# Patient Record
Sex: Female | Born: 1935 | Race: White | Hispanic: No | Smoking: Never smoker
Health system: Southern US, Community
[De-identification: ages and names within clinical notes are randomized; demographics above are authoritative.]

## PROBLEM LIST (undated history)

## (undated) DIAGNOSIS — N390 Urinary tract infection, site not specified: Secondary | ICD-10-CM

## (undated) DIAGNOSIS — I4819 Other persistent atrial fibrillation: Secondary | ICD-10-CM

## (undated) DIAGNOSIS — F0280 Dementia in other diseases classified elsewhere without behavioral disturbance: Secondary | ICD-10-CM

## (undated) DIAGNOSIS — J40 Bronchitis, not specified as acute or chronic: Secondary | ICD-10-CM

## (undated) DIAGNOSIS — I447 Left bundle-branch block, unspecified: Secondary | ICD-10-CM

## (undated) DIAGNOSIS — I509 Heart failure, unspecified: Secondary | ICD-10-CM

## (undated) DIAGNOSIS — K922 Gastrointestinal hemorrhage, unspecified: Secondary | ICD-10-CM

## (undated) DIAGNOSIS — E0590 Thyrotoxicosis, unspecified without thyrotoxic crisis or storm: Secondary | ICD-10-CM

## (undated) DIAGNOSIS — E559 Vitamin D deficiency, unspecified: Secondary | ICD-10-CM

## (undated) DIAGNOSIS — N183 Chronic kidney disease, stage 3 unspecified: Secondary | ICD-10-CM

## (undated) DIAGNOSIS — J189 Pneumonia, unspecified organism: Secondary | ICD-10-CM

## (undated) DIAGNOSIS — E538 Deficiency of other specified B group vitamins: Secondary | ICD-10-CM

## (undated) DIAGNOSIS — K219 Gastro-esophageal reflux disease without esophagitis: Secondary | ICD-10-CM

## (undated) DIAGNOSIS — I10 Essential (primary) hypertension: Secondary | ICD-10-CM

## (undated) DIAGNOSIS — I4891 Unspecified atrial fibrillation: Secondary | ICD-10-CM

## (undated) DIAGNOSIS — E079 Disorder of thyroid, unspecified: Secondary | ICD-10-CM

## (undated) DIAGNOSIS — E785 Hyperlipidemia, unspecified: Secondary | ICD-10-CM

## (undated) DIAGNOSIS — E663 Overweight: Secondary | ICD-10-CM

## (undated) DIAGNOSIS — G309 Alzheimer's disease, unspecified: Secondary | ICD-10-CM

## (undated) DIAGNOSIS — E119 Type 2 diabetes mellitus without complications: Secondary | ICD-10-CM

## (undated) DIAGNOSIS — R0600 Dyspnea, unspecified: Secondary | ICD-10-CM

## (undated) DIAGNOSIS — N3281 Overactive bladder: Secondary | ICD-10-CM

## (undated) HISTORY — PX: ABDOMINAL HYSTERECTOMY: SHX81

## (undated) HISTORY — DX: Hyperlipidemia, unspecified: E78.5

## (undated) HISTORY — DX: Left bundle-branch block, unspecified: I44.7

## (undated) HISTORY — DX: Deficiency of other specified B group vitamins: E53.8

## (undated) HISTORY — DX: Gastro-esophageal reflux disease without esophagitis: K21.9

## (undated) HISTORY — DX: Unspecified atrial fibrillation: I48.91

## (undated) HISTORY — DX: Gastrointestinal hemorrhage, unspecified: K92.2

## (undated) HISTORY — DX: Alzheimer's disease, unspecified: G30.9

## (undated) HISTORY — DX: Overweight: E66.3

## (undated) HISTORY — DX: Chronic kidney disease, stage 3 unspecified: N18.30

## (undated) HISTORY — DX: Overactive bladder: N32.81

## (undated) HISTORY — DX: Chronic kidney disease, stage 3 (moderate): N18.3

## (undated) HISTORY — DX: Vitamin D deficiency, unspecified: E55.9

## (undated) HISTORY — DX: Other persistent atrial fibrillation: I48.19

## (undated) HISTORY — DX: Thyrotoxicosis, unspecified without thyrotoxic crisis or storm: E05.90

## (undated) HISTORY — DX: Dementia in other diseases classified elsewhere, unspecified severity, without behavioral disturbance, psychotic disturbance, mood disturbance, and anxiety: F02.80

---

## 2013-11-20 DIAGNOSIS — K922 Gastrointestinal hemorrhage, unspecified: Secondary | ICD-10-CM

## 2013-11-20 HISTORY — DX: Gastrointestinal hemorrhage, unspecified: K92.2

## 2015-01-24 ENCOUNTER — Emergency Department (HOSPITAL_BASED_OUTPATIENT_CLINIC_OR_DEPARTMENT_OTHER)
Admission: EM | Admit: 2015-01-24 | Discharge: 2015-01-24 | Disposition: A | Discharge status: Home / Self Care | Payer: Medicare HMO | Attending: Emergency Medicine | Admitting: Emergency Medicine

## 2015-01-24 ENCOUNTER — Encounter (HOSPITAL_BASED_OUTPATIENT_CLINIC_OR_DEPARTMENT_OTHER): Payer: Self-pay | Admitting: *Deleted

## 2015-01-24 ENCOUNTER — Emergency Department (HOSPITAL_BASED_OUTPATIENT_CLINIC_OR_DEPARTMENT_OTHER): Payer: Medicare HMO

## 2015-01-24 DIAGNOSIS — Z87448 Personal history of other diseases of urinary system: Secondary | ICD-10-CM | POA: Diagnosis not present

## 2015-01-24 DIAGNOSIS — I4891 Unspecified atrial fibrillation: Secondary | ICD-10-CM | POA: Diagnosis not present

## 2015-01-24 DIAGNOSIS — Z79899 Other long term (current) drug therapy: Secondary | ICD-10-CM | POA: Diagnosis not present

## 2015-01-24 DIAGNOSIS — I10 Essential (primary) hypertension: Secondary | ICD-10-CM | POA: Insufficient documentation

## 2015-01-24 DIAGNOSIS — I509 Heart failure, unspecified: Secondary | ICD-10-CM

## 2015-01-24 DIAGNOSIS — R0981 Nasal congestion: Secondary | ICD-10-CM | POA: Diagnosis present

## 2015-01-24 DIAGNOSIS — F0390 Unspecified dementia without behavioral disturbance: Secondary | ICD-10-CM | POA: Diagnosis not present

## 2015-01-24 DIAGNOSIS — E119 Type 2 diabetes mellitus without complications: Secondary | ICD-10-CM | POA: Insufficient documentation

## 2015-01-24 HISTORY — DX: Essential (primary) hypertension: I10

## 2015-01-24 HISTORY — DX: Type 2 diabetes mellitus without complications: E11.9

## 2015-01-24 HISTORY — DX: Heart failure, unspecified: I50.9

## 2015-01-24 LAB — COMPREHENSIVE METABOLIC PANEL
ALBUMIN: 3.8 g/dL (ref 3.5–5.0)
ALT: 18 U/L (ref 14–54)
AST: 21 U/L (ref 15–41)
Alkaline Phosphatase: 90 U/L (ref 38–126)
Anion gap: 9 (ref 5–15)
BUN: 30 mg/dL — AB (ref 6–20)
CHLORIDE: 105 mmol/L (ref 101–111)
CO2: 26 mmol/L (ref 22–32)
Calcium: 8.8 mg/dL — ABNORMAL LOW (ref 8.9–10.3)
Creatinine, Ser: 1.81 mg/dL — ABNORMAL HIGH (ref 0.44–1.00)
GFR, EST AFRICAN AMERICAN: 28 mL/min — AB (ref >60)
GFR, EST NON AFRICAN AMERICAN: 25 mL/min — AB (ref >60)
Glucose, Bld: 199 mg/dL — ABNORMAL HIGH (ref 65–99)
POTASSIUM: 3.9 mmol/L (ref 3.5–5.1)
Sodium: 140 mmol/L (ref 135–145)
Total Bilirubin: 0.4 mg/dL (ref 0.3–1.2)
Total Protein: 7.3 g/dL (ref 6.5–8.1)

## 2015-01-24 LAB — CBC WITH DIFFERENTIAL/PLATELET
Basophils Absolute: 0 10*3/uL (ref 0.0–0.1)
Basophils Relative: 0 % (ref 0–1)
EOS PCT: 2 % (ref 0–5)
Eosinophils Absolute: 0.1 10*3/uL (ref 0.0–0.7)
HEMATOCRIT: 38.4 % (ref 36.0–46.0)
Hemoglobin: 11.6 g/dL — ABNORMAL LOW (ref 12.0–15.0)
LYMPHS ABS: 1.4 10*3/uL (ref 0.7–4.0)
Lymphocytes Relative: 21 % (ref 12–46)
MCH: 26.4 pg (ref 26.0–34.0)
MCHC: 30.2 g/dL (ref 30.0–36.0)
MCV: 87.3 fL (ref 78.0–100.0)
Monocytes Absolute: 0.6 10*3/uL (ref 0.1–1.0)
Monocytes Relative: 8 % (ref 3–12)
NEUTROS ABS: 4.9 10*3/uL (ref 1.7–7.7)
NEUTROS PCT: 69 % (ref 43–77)
Platelets: 215 10*3/uL (ref 150–400)
RBC: 4.4 MIL/uL (ref 3.87–5.11)
RDW: 15 % (ref 11.5–15.5)
WBC: 7 10*3/uL (ref 4.0–10.5)

## 2015-01-24 LAB — BRAIN NATRIURETIC PEPTIDE: B NATRIURETIC PEPTIDE 5: 71.4 pg/mL (ref 0.0–100.0)

## 2015-01-24 LAB — TROPONIN I

## 2015-01-24 MED ORDER — FUROSEMIDE 10 MG/ML IJ SOLN
40.0000 mg | Freq: Once | INTRAMUSCULAR | Status: AC
  Administered 2015-01-24: 40 mg via INTRAVENOUS
  Filled 2015-01-24: qty 4

## 2015-01-24 NOTE — Discharge Instructions (Signed)
Increase your Lasix to 40 mg twice daily for the next 3 days, then resume your normal dose.  Return to the emergency department if your symptoms significantly worsen or change.   Heart Failure Heart failure is a condition in which the heart has trouble pumping blood. This means your heart does not pump blood efficiently for your body to work well. In some cases of heart failure, fluid may back up into your lungs or you may have swelling (edema) in your lower legs. Heart failure is usually a long-term (chronic) condition. It is important for you to take good care of yourself and follow your health care provider's treatment plan. CAUSES  Some health conditions can cause heart failure. Those health conditions include:  High blood pressure (hypertension). Hypertension causes the heart muscle to work harder than normal. When pressure in the blood vessels is high, the heart needs to pump (contract) with more force in order to circulate blood throughout the body. High blood pressure eventually causes the heart to become stiff and weak.  Coronary artery disease (CAD). CAD is the buildup of cholesterol and fat (plaque) in the arteries of the heart. The blockage in the arteries deprives the heart muscle of oxygen and blood. This can cause chest pain and may lead to a heart attack. High blood pressure can also contribute to CAD.  Heart attack (myocardial infarction). A heart attack occurs when one or more arteries in the heart become blocked. The loss of oxygen damages the muscle tissue of the heart. When this happens, part of the heart muscle dies. The injured tissue does not contract as well and weakens the heart's ability to pump blood.  Abnormal heart valves. When the heart valves do not open and close properly, it can cause heart failure. This makes the heart muscle pump harder to keep the blood flowing.  Heart muscle disease (cardiomyopathy or myocarditis). Heart muscle disease is damage to the heart  muscle from a variety of causes. These can include drug or alcohol abuse, infections, or unknown reasons. These can increase the risk of heart failure.  Lung disease. Lung disease makes the heart work harder because the lungs do not work properly. This can cause a strain on the heart, leading it to fail.  Diabetes. Diabetes increases the risk of heart failure. High blood sugar contributes to high fat (lipid) levels in the blood. Diabetes can also cause slow damage to tiny blood vessels that carry important nutrients to the heart muscle. When the heart does not get enough oxygen and food, it can cause the heart to become weak and stiff. This leads to a heart that does not contract efficiently.  Other conditions can contribute to heart failure. These include abnormal heart rhythms, thyroid problems, and low blood counts (anemia). Certain unhealthy behaviors can increase the risk of heart failure, including:  Being overweight.  Smoking or chewing tobacco.  Eating foods high in fat and cholesterol.  Abusing illicit drugs or alcohol.  Lacking physical activity. SYMPTOMS  Heart failure symptoms may vary and can be hard to detect. Symptoms may include:  Shortness of breath with activity, such as climbing stairs.  Persistent cough.  Swelling of the feet, ankles, legs, or abdomen.  Unexplained weight gain.  Difficulty breathing when lying flat (orthopnea).  Waking from sleep because of the need to sit up and get more air.  Rapid heartbeat.  Fatigue and loss of energy.  Feeling light-headed, dizzy, or close to fainting.  Loss of appetite.  Nausea.  Increased urination during the night (nocturia). DIAGNOSIS  A diagnosis of heart failure is based on your history, symptoms, physical examination, and diagnostic tests. Diagnostic tests for heart failure may include:  Echocardiography.  Electrocardiography.  Chest X-ray.  Blood tests.  Exercise stress test.  Cardiac  angiography.  Radionuclide scans. TREATMENT  Treatment is aimed at managing the symptoms of heart failure. Medicines, behavioral changes, or surgical intervention may be necessary to treat heart failure.  Medicines to help treat heart failure may include:  Angiotensin-converting enzyme (ACE) inhibitors. This type of medicine blocks the effects of a blood protein called angiotensin-converting enzyme. ACE inhibitors relax (dilate) the blood vessels and help lower blood pressure.  Angiotensin receptor blockers (ARBs). This type of medicine blocks the actions of a blood protein called angiotensin. Angiotensin receptor blockers dilate the blood vessels and help lower blood pressure.  Water pills (diuretics). Diuretics cause the kidneys to remove salt and water from the blood. The extra fluid is removed through urination. This loss of extra fluid lowers the volume of blood the heart pumps.  Beta blockers. These prevent the heart from beating too fast and improve heart muscle strength.  Digitalis. This increases the force of the heartbeat.  Healthy behavior changes include:  Obtaining and maintaining a healthy weight.  Stopping smoking or chewing tobacco.  Eating heart-healthy foods.  Limiting or avoiding alcohol.  Stopping illicit drug use.  Physical activity as directed by your health care provider.  Surgical treatment for heart failure may include:  A procedure to open blocked arteries, repair damaged heart valves, or remove damaged heart muscle tissue.  A pacemaker to improve heart muscle function and control certain abnormal heart rhythms.  An internal cardioverter defibrillator to treat certain serious abnormal heart rhythms.  A left ventricular assist device (LVAD) to assist the pumping ability of the heart. HOME CARE INSTRUCTIONS   Take medicines only as directed by your health care provider. Medicines are important in reducing the workload of your heart, slowing the  progression of heart failure, and improving your symptoms.  Do not stop taking your medicine unless directed by your health care provider.  Do not skip any dose of medicine.  Refill your prescriptions before you run out of medicine. Your medicines are needed every day.  Engage in moderate physical activity if directed by your health care provider. Moderate physical activity can benefit some people. The elderly and people with severe heart failure should consult with a health care provider for physical activity recommendations.  Eat heart-healthy foods. Food choices should be free of trans fat and low in saturated fat, cholesterol, and salt (sodium). Healthy choices include fresh or frozen fruits and vegetables, fish, lean meats, legumes, fat-free or low-fat dairy products, and whole grain or high fiber foods. Talk to a dietitian to learn more about heart-healthy foods.  Limit sodium if directed by your health care provider. Sodium restriction may reduce symptoms of heart failure in some people. Talk to a dietitian to learn more about heart-healthy seasonings.  Use healthy cooking methods. Healthy cooking methods include roasting, grilling, broiling, baking, poaching, steaming, or stir-frying. Talk to a dietitian to learn more about healthy cooking methods.  Limit fluids if directed by your health care provider. Fluid restriction may reduce symptoms of heart failure in some people.  Weigh yourself every day. Daily weights are important in the early recognition of excess fluid. You should weigh yourself every morning after you urinate and before you eat breakfast. Wear the same amount of  clothing each time you weigh yourself. Record your daily weight. Provide your health care provider with your weight record.  Monitor and record your blood pressure if directed by your health care provider.  Check your pulse if directed by your health care provider.  Lose weight if directed by your health care  provider. Weight loss may reduce symptoms of heart failure in some people.  Stop smoking or chewing tobacco. Nicotine makes your heart work harder by causing your blood vessels to constrict. Do not use nicotine gum or patches before talking to your health care provider.  Keep all follow-up visits as directed by your health care provider. This is important.  Limit alcohol intake to no more than 1 drink per day for nonpregnant women and 2 drinks per day for men. One drink equals 12 ounces of beer, 5 ounces of wine, or 1 ounces of hard liquor. Drinking more than that is harmful to your heart. Tell your health care provider if you drink alcohol several times a week. Talk with your health care provider about whether alcohol is safe for you. If your heart has already been damaged by alcohol or you have severe heart failure, drinking alcohol should be stopped completely.  Stop illicit drug use.  Stay up-to-date with immunizations. It is especially important to prevent respiratory infections through current pneumococcal and influenza immunizations.  Manage other health conditions such as hypertension, diabetes, thyroid disease, or abnormal heart rhythms as directed by your health care provider.  Learn to manage stress.  Plan rest periods when fatigued.  Learn strategies to manage high temperatures. If the weather is extremely hot:  Avoid vigorous physical activity.  Use air conditioning or fans or seek a cooler location.  Avoid caffeine and alcohol.  Wear loose-fitting, lightweight, and light-colored clothing.  Learn strategies to manage cold temperatures. If the weather is extremely cold:  Avoid vigorous physical activity.  Layer clothes.  Wear mittens or gloves, a hat, and a scarf when going outside.  Avoid alcohol.  Obtain ongoing education and support as needed.  Participate in or seek rehabilitation as needed to maintain or improve independence and quality of life. SEEK MEDICAL  CARE IF:   Your weight increases by 03 lb/1.4 kg in 1 day or 05 lb/2.3 kg in a week.  You have increasing shortness of breath that is unusual for you.  You are unable to participate in your usual physical activities.  You tire easily.  You cough more than normal, especially with physical activity.  You have any or more swelling in areas such as your hands, feet, ankles, or abdomen.  You are unable to sleep because it is hard to breathe.  You feel like your heart is beating fast (palpitations).  You become dizzy or light-headed upon standing up. SEEK IMMEDIATE MEDICAL CARE IF:   You have difficulty breathing.  There is a change in mental status such as decreased alertness or difficulty with concentration.  You have a pain or discomfort in your chest.  You have an episode of fainting (syncope). MAKE SURE YOU:   Understand these instructions.  Will watch your condition.  Will get help right away if you are not doing well or get worse. Document Released: 08/08/2005 Document Revised: 12/23/2013 Document Reviewed: 09/07/2012 [facility] Patient Information 2015 [place], [place]. This information is not intended to replace advice given to you by your health care provider. Make sure you discuss any questions you have with your health care provider.

## 2015-01-24 NOTE — ED Provider Notes (Signed)
CSN: [id number]     Arrival date & time 01/24/15  1255 History  This chart was scribed for Anna Speak, MD by Meriel Pica, ED Scribe. This patient was seen in room MH08/MH08 and the patient's care was started 3:07 PM.  Chief Complaint  Patient presents with  . URI   Patient is a 81 y.o. female presenting with URI. The history is provided by the patient and a relative. No language interpreter was used.  URI Presenting symptoms: congestion   Presenting symptoms: no fever   Severity:  Moderate Onset quality:  Gradual Timing:  Constant Progression:  Improving  HPI Comments: Anna [name] is a 81 y.o. female, with a PMhx of CHF,A-fib, renal disorder, DM, HTN, and dementia, who presents to the Emergency Department complaining of constant, moderate, sinus congestion onset 3 weeks ago. The daughter reports associated labored breathing, fatigue, a change in appetite, and mild BLE swelling in the morning. Per daughter 3 weeks ago the pt was diagnosed with a sinus infection and given 2 weeks of antibiotics that she was compliant with. The daughter reports her O2 levels are good at home. She uses a walker and at home and has been winded when ambulating. Daughter has given her 600mg  Mucinex per day pta with no relief. The pt is currently on Lasix but has recently decreased her dosage after discussion with her PCP. Pt denies nausea, vomiting, diarrhea, fevers, or changes in bowel or bladder.   Past Medical History  Diagnosis Date  . CHF (congestive heart failure)   . A-fib   . Renal disorder   . Diabetes mellitus without complication   . Hypertension   . Dementia    Past Surgical History  Procedure Laterality Date  . Abdominal hysterectomy     No family history on file. History  Substance Use Topics  . Smoking status: Never Smoker   . Smokeless tobacco: Not on file  . Alcohol Use: No   OB History    No data available     Review of Systems  Constitutional: Positive for appetite  change. Negative for fever.  HENT: Positive for congestion.   Respiratory: Positive for shortness of breath.   Cardiovascular: Positive for leg swelling.  Gastrointestinal: Negative for nausea, vomiting, diarrhea and constipation.  Genitourinary: Negative for urgency and frequency.  Neurological: Positive for weakness.   A complete 10 system review of systems was obtained and is otherwise negative except at noted in the HPI and PMH.  Allergies  Review of patient's allergies indicates no known allergies.  Home Medications   Prior to Admission medications   Medication Sig Start Date End Date Taking? Authorizing Provider  amiodarone (PACERONE) 200 MG tablet Take 200 mg by mouth daily.   Yes Historical Provider, MD  amLODipine-benazepril (LOTREL) 5-10 MG per capsule Take 1 capsule by mouth daily.   Yes Historical Provider, MD  donepezil (ARICEPT) 10 MG tablet Take 10 mg by mouth at bedtime.   Yes Historical Provider, MD  glimepiride (AMARYL) 4 MG tablet Take 4 mg by mouth daily with breakfast.   Yes Historical Provider, MD  guaiFENesin (MUCINEX) 600 MG 12 hr tablet Take by mouth 2 (two) times daily.   Yes Historical Provider, MD  metoprolol succinate (TOPROL-XL) 25 MG 24 hr tablet Take 12.5 mg by mouth daily.   Yes Historical Provider, MD  pravastatin (PRAVACHOL) 40 MG tablet Take 40 mg by mouth daily.   Yes Historical Provider, MD   Triage Vitals: BP 161/69 mmHg  Pulse 97  Temp(Src) 98.2 F (36.8 C) (Oral)  Resp 18  Ht 5\' 6"  (1.676 m)  Wt 215 lb (97.523 kg)  BMI 34.72 kg/m2  SpO2 96%  Physical Exam  Constitutional: She appears well-developed and well-nourished.  HENT:  Head: Normocephalic and atraumatic.  Eyes: Conjunctivae are normal. Right eye exhibits no discharge. Left eye exhibits no discharge.  Cardiovascular: Normal rate, regular rhythm and normal heart sounds.   No murmur heard. Pulmonary/Chest: Effort normal. No respiratory distress. She has rales.  There are rales in  the bases bilaterally.   Musculoskeletal: She exhibits edema.  There is 1+ pitting edema in BLE.   Neurological: She is alert. Coordination normal.  Skin: Skin is warm and dry. No rash noted. She is not diaphoretic. No erythema.  Psychiatric: She has a normal mood and affect.  Nursing note and vitals reviewed.  ED Course  Procedures  DIAGNOSTIC STUDIES: Oxygen Saturation is 96% on RA, adequate by my interpretation.    COORDINATION OF CARE: 3:13 PM Discussed treatment plan which includes to order Lasix for pt in ED and get diagnostic labs. Discussed CX-ray with pt and daughter. Pt and daughter acknowledges and agrees to plan.   Labs Review Labs Reviewed  COMPREHENSIVE METABOLIC PANEL  BRAIN NATRIURETIC PEPTIDE  TROPONIN I  CBC WITH DIFFERENTIAL/PLATELET    Imaging Review Dg Chest 2 View  01/24/2015   CLINICAL DATA:  Reason URI, continued congestion, history CHF, atrial fibrillation, dementia, diabetes, hypertension  EXAM: CHEST  2 VIEW  COMPARISON:  None  FINDINGS: Enlargement of cardiac silhouette with pulmonary vascular congestion.  Scattered interstitial infiltrates likely pulmonary edema and CHF.  Minimal bibasilar atelectasis.  No segmental consolidation, pleural effusion or pneumothorax.  IMPRESSION: Probable CHF with minimal bibasilar atelectasis.   Electronically Signed   By: Lavonia Dana M.D.   On: 01/24/2015 14:40     EKG Interpretation   Date/Time:  Saturday January 24 2015 16:03:32 EDT Ventricular Rate:  54 PR Interval:  286 QRS Duration: 166 QT Interval:  522 QTC Calculation: 495 R Axis:   1 Text Interpretation:  Sinus bradycardia with 1st degree A-V block Left  bundle branch block Abnormal ECG No previous tracing Confirmed by POLLINA   MD, CHRISTOPHER [phone]) on 01/24/2015 4:06:45 PM      MDM   Final diagnoses:  None    Patient is an 81 year old female with history of congestive heart failure and atrial fibrillation. She presents for evaluation of chest  congestion and dyspnea on exertion over the past week. She is here from out of town visiting her daughter who brings her for evaluation of this. Workup reveals chest x-ray suggestive of volume overload, however EKG and troponin are negative for acute infarct. She was given Lasix with a good diuresis. She is feeling better and I feel as though she is appropriate for discharge with increased Lasix dose for the next 3 days and when necessary return.  I personally performed the services described in this documentation, which was scribed in my presence. The recorded information has been reviewed and is accurate.      Anna Speak, MD 01/24/15 [phone]

## 2015-01-24 NOTE — ED Notes (Signed)
Pt is visiting from [place] and daughter states that she was recently on antibiotics for URI and has completed this and her daughter is concerned about her continued congestion and her hx of CHF and afib as well as wanting her checked out in general due to her medical history.  Pt appears in no distress and tells me that she feels "fine"

## 2015-01-27 ENCOUNTER — Other Ambulatory Visit (HOSPITAL_COMMUNITY): Payer: Self-pay

## 2015-01-27 ENCOUNTER — Emergency Department (HOSPITAL_COMMUNITY): Payer: Medicare HMO

## 2015-01-27 ENCOUNTER — Encounter (HOSPITAL_COMMUNITY): Payer: Self-pay | Admitting: Emergency Medicine

## 2015-01-27 ENCOUNTER — Inpatient Hospital Stay (HOSPITAL_COMMUNITY)
Admission: EM | Admit: 2015-01-27 | Discharge: 2015-01-27 | Disposition: A | Discharge status: Home / Self Care | Payer: Medicare HMO | Source: Home / Self Care | Attending: Internal Medicine | Admitting: Internal Medicine

## 2015-01-27 ENCOUNTER — Observation Stay (HOSPITAL_COMMUNITY)
Admission: EM | Admit: 2015-01-27 | Discharge: 2015-01-28 | Disposition: A | Discharge status: Home / Self Care | Payer: Medicare HMO | Attending: Internal Medicine | Admitting: Internal Medicine

## 2015-01-27 DIAGNOSIS — N179 Acute kidney failure, unspecified: Secondary | ICD-10-CM | POA: Insufficient documentation

## 2015-01-27 DIAGNOSIS — E1122 Type 2 diabetes mellitus with diabetic chronic kidney disease: Secondary | ICD-10-CM | POA: Insufficient documentation

## 2015-01-27 DIAGNOSIS — E1129 Type 2 diabetes mellitus with other diabetic kidney complication: Secondary | ICD-10-CM | POA: Diagnosis not present

## 2015-01-27 DIAGNOSIS — Z7982 Long term (current) use of aspirin: Secondary | ICD-10-CM | POA: Diagnosis not present

## 2015-01-27 DIAGNOSIS — R0600 Dyspnea, unspecified: Principal | ICD-10-CM | POA: Insufficient documentation

## 2015-01-27 DIAGNOSIS — I129 Hypertensive chronic kidney disease with stage 1 through stage 4 chronic kidney disease, or unspecified chronic kidney disease: Secondary | ICD-10-CM | POA: Diagnosis not present

## 2015-01-27 DIAGNOSIS — N183 Chronic kidney disease, stage 3 unspecified: Secondary | ICD-10-CM | POA: Diagnosis present

## 2015-01-27 DIAGNOSIS — M79609 Pain in unspecified limb: Secondary | ICD-10-CM | POA: Diagnosis not present

## 2015-01-27 DIAGNOSIS — R0602 Shortness of breath: Secondary | ICD-10-CM | POA: Diagnosis present

## 2015-01-27 DIAGNOSIS — R627 Adult failure to thrive: Secondary | ICD-10-CM | POA: Insufficient documentation

## 2015-01-27 DIAGNOSIS — I5020 Unspecified systolic (congestive) heart failure: Secondary | ICD-10-CM | POA: Insufficient documentation

## 2015-01-27 DIAGNOSIS — I10 Essential (primary) hypertension: Secondary | ICD-10-CM | POA: Diagnosis present

## 2015-01-27 DIAGNOSIS — Z90710 Acquired absence of both cervix and uterus: Secondary | ICD-10-CM | POA: Insufficient documentation

## 2015-01-27 DIAGNOSIS — F0390 Unspecified dementia without behavioral disturbance: Secondary | ICD-10-CM | POA: Diagnosis not present

## 2015-01-27 DIAGNOSIS — I480 Paroxysmal atrial fibrillation: Secondary | ICD-10-CM | POA: Insufficient documentation

## 2015-01-27 DIAGNOSIS — I4819 Other persistent atrial fibrillation: Secondary | ICD-10-CM | POA: Diagnosis present

## 2015-01-27 DIAGNOSIS — E0590 Thyrotoxicosis, unspecified without thyrotoxic crisis or storm: Secondary | ICD-10-CM | POA: Insufficient documentation

## 2015-01-27 DIAGNOSIS — N058 Unspecified nephritic syndrome with other morphologic changes: Secondary | ICD-10-CM

## 2015-01-27 LAB — CBC WITH DIFFERENTIAL/PLATELET
BASOS ABS: 0 10*3/uL (ref 0.0–0.1)
Basophils Relative: 1 % (ref 0–1)
EOS PCT: 2 % (ref 0–5)
Eosinophils Absolute: 0.1 10*3/uL (ref 0.0–0.7)
HCT: 39 % (ref 36.0–46.0)
HEMOGLOBIN: 11.8 g/dL — AB (ref 12.0–15.0)
LYMPHS ABS: 1.3 10*3/uL (ref 0.7–4.0)
Lymphocytes Relative: 23 % (ref 12–46)
MCH: 25.9 pg — ABNORMAL LOW (ref 26.0–34.0)
MCHC: 30.3 g/dL (ref 30.0–36.0)
MCV: 85.7 fL (ref 78.0–100.0)
Monocytes Absolute: 0.6 10*3/uL (ref 0.1–1.0)
Monocytes Relative: 10 % (ref 3–12)
NEUTROS ABS: 3.7 10*3/uL (ref 1.7–7.7)
Neutrophils Relative %: 64 % (ref 43–77)
Platelets: 211 10*3/uL (ref 150–400)
RBC: 4.55 MIL/uL (ref 3.87–5.11)
RDW: 14.9 % (ref 11.5–15.5)
WBC: 5.7 10*3/uL (ref 4.0–10.5)

## 2015-01-27 LAB — URINALYSIS, ROUTINE W REFLEX MICROSCOPIC
Bilirubin Urine: NEGATIVE
GLUCOSE, UA: NEGATIVE mg/dL
HGB URINE DIPSTICK: NEGATIVE
KETONES UR: NEGATIVE mg/dL
Nitrite: NEGATIVE
Protein, ur: NEGATIVE mg/dL
Specific Gravity, Urine: 1.008 (ref 1.005–1.030)
UROBILINOGEN UA: 0.2 mg/dL (ref 0.0–1.0)
pH: 5.5 (ref 5.0–8.0)

## 2015-01-27 LAB — TROPONIN I
Troponin I: <0.03 ng/mL (ref <0.031)
Troponin I: <0.03 ng/mL (ref <0.031)

## 2015-01-27 LAB — CBC
HCT: 39.1 % (ref 36.0–46.0)
Hemoglobin: 12.3 g/dL (ref 12.0–15.0)
MCH: 26.8 pg (ref 26.0–34.0)
MCHC: 31.5 g/dL (ref 30.0–36.0)
MCV: 85.2 fL (ref 78.0–100.0)
Platelets: 234 10*3/uL (ref 150–400)
RBC: 4.59 MIL/uL (ref 3.87–5.11)
RDW: 14.8 % (ref 11.5–15.5)
WBC: 6.2 10*3/uL (ref 4.0–10.5)

## 2015-01-27 LAB — URINE MICROSCOPIC-ADD ON

## 2015-01-27 LAB — BASIC METABOLIC PANEL
Anion gap: 10 (ref 5–15)
BUN: 38 mg/dL — ABNORMAL HIGH (ref 6–20)
CHLORIDE: 104 mmol/L (ref 101–111)
CO2: 27 mmol/L (ref 22–32)
Calcium: 9.3 mg/dL (ref 8.9–10.3)
Creatinine, Ser: 1.99 mg/dL — ABNORMAL HIGH (ref 0.44–1.00)
GFR calc Af Amer: 25 mL/min — ABNORMAL LOW (ref >60)
GFR calc non Af Amer: 22 mL/min — ABNORMAL LOW (ref >60)
GLUCOSE: 204 mg/dL — AB (ref 65–99)
Potassium: 3.7 mmol/L (ref 3.5–5.1)
Sodium: 141 mmol/L (ref 135–145)

## 2015-01-27 LAB — CREATININE, SERUM
CREATININE: 2.04 mg/dL — AB (ref 0.44–1.00)
GFR calc Af Amer: 25 mL/min — ABNORMAL LOW (ref >60)
GFR calc non Af Amer: 21 mL/min — ABNORMAL LOW (ref >60)

## 2015-01-27 LAB — GLUCOSE, CAPILLARY: Glucose-Capillary: 147 mg/dL — ABNORMAL HIGH (ref 65–99)

## 2015-01-27 LAB — CBG MONITORING, ED: Glucose-Capillary: 124 mg/dL — ABNORMAL HIGH (ref 65–99)

## 2015-01-27 LAB — TSH: TSH: 0.02 u[IU]/mL — ABNORMAL LOW (ref 0.350–4.500)

## 2015-01-27 LAB — BRAIN NATRIURETIC PEPTIDE: B Natriuretic Peptide: 26.4 pg/mL (ref 0.0–100.0)

## 2015-01-27 MED ORDER — INSULIN ASPART 100 UNIT/ML ~~LOC~~ SOLN
0.0000 [IU] | Freq: Three times a day (TID) | SUBCUTANEOUS | Status: DC
  Administered 2015-01-28: 2 [IU] via SUBCUTANEOUS

## 2015-01-27 MED ORDER — SODIUM CHLORIDE 0.9 % IJ SOLN: 3.0000 mL | INTRAMUSCULAR | Status: DC | PRN

## 2015-01-27 MED ORDER — SODIUM CHLORIDE 0.9 % IV SOLN: 250.0000 mL | INTRAVENOUS | Status: DC | PRN

## 2015-01-27 MED ORDER — GUAIFENESIN ER 600 MG PO TB12
600.0000 mg | ORAL_TABLET | Freq: Two times a day (BID) | ORAL | Status: DC
  Administered 2015-01-27 – 2015-01-28 (×2): 600 mg via ORAL
  Filled 2015-01-27 (×3): qty 1

## 2015-01-27 MED ORDER — AMLODIPINE BESYLATE 5 MG PO TABS
5.0000 mg | ORAL_TABLET | Freq: Every day | ORAL | Status: DC
  Administered 2015-01-28: 5 mg via ORAL
  Filled 2015-01-27: qty 1

## 2015-01-27 MED ORDER — AMIODARONE HCL 200 MG PO TABS
200.0000 mg | ORAL_TABLET | Freq: Every day | ORAL | Status: DC
  Administered 2015-01-28: 200 mg via ORAL
  Filled 2015-01-27: qty 1

## 2015-01-27 MED ORDER — ONDANSETRON HCL 4 MG/2ML IJ SOLN: 4.0000 mg | Freq: Four times a day (QID) | INTRAMUSCULAR | Status: DC | PRN

## 2015-01-27 MED ORDER — ACETAMINOPHEN 650 MG RE SUPP: 650.0000 mg | Freq: Four times a day (QID) | RECTAL | Status: DC | PRN

## 2015-01-27 MED ORDER — ALBUTEROL SULFATE (2.5 MG/3ML) 0.083% IN NEBU: 2.5000 mg | INHALATION_SOLUTION | RESPIRATORY_TRACT | Status: DC | PRN

## 2015-01-27 MED ORDER — MIRABEGRON ER 50 MG PO TB24
50.0000 mg | ORAL_TABLET | Freq: Every day | ORAL | Status: DC
  Administered 2015-01-27 – 2015-01-28 (×2): 50 mg via ORAL
  Filled 2015-01-27 (×2): qty 1

## 2015-01-27 MED ORDER — ASPIRIN 325 MG PO TABS
325.0000 mg | ORAL_TABLET | Freq: Every day | ORAL | Status: DC
  Administered 2015-01-28: 325 mg via ORAL
  Filled 2015-01-27: qty 1

## 2015-01-27 MED ORDER — VITAMIN D (ERGOCALCIFEROL) 1.25 MG (50000 UNIT) PO CAPS: 50000.0000 [IU] | ORAL_CAPSULE | ORAL | Status: DC

## 2015-01-27 MED ORDER — DOCUSATE SODIUM 100 MG PO CAPS: 100.0000 mg | ORAL_CAPSULE | Freq: Every day | ORAL | Status: DC | PRN

## 2015-01-27 MED ORDER — CYANOCOBALAMIN 500 MCG PO TABS
500.0000 ug | ORAL_TABLET | Freq: Every day | ORAL | Status: DC
  Administered 2015-01-28: 500 ug via ORAL
  Filled 2015-01-27: qty 1

## 2015-01-27 MED ORDER — FUROSEMIDE 10 MG/ML IJ SOLN
40.0000 mg | Freq: Once | INTRAMUSCULAR | Status: AC
  Administered 2015-01-27: 40 mg via INTRAVENOUS
  Filled 2015-01-27: qty 4

## 2015-01-27 MED ORDER — CALCITRIOL 0.5 MCG PO CAPS
0.5000 ug | ORAL_CAPSULE | ORAL | Status: DC
  Administered 2015-01-28: 0.5 ug via ORAL
  Filled 2015-01-27: qty 1

## 2015-01-27 MED ORDER — ONDANSETRON HCL 4 MG PO TABS: 4.0000 mg | ORAL_TABLET | Freq: Four times a day (QID) | ORAL | Status: DC | PRN

## 2015-01-27 MED ORDER — ACETAMINOPHEN 325 MG PO TABS: 650.0000 mg | ORAL_TABLET | Freq: Four times a day (QID) | ORAL | Status: DC | PRN

## 2015-01-27 MED ORDER — GUAIFENESIN-DM 100-10 MG/5ML PO SYRP: 5.0000 mL | ORAL_SOLUTION | ORAL | Status: DC | PRN

## 2015-01-27 MED ORDER — HEPARIN SODIUM (PORCINE) 5000 UNIT/ML IJ SOLN
5000.0000 [IU] | Freq: Three times a day (TID) | INTRAMUSCULAR | Status: DC
  Administered 2015-01-27 – 2015-01-28 (×2): 5000 [IU] via SUBCUTANEOUS
  Filled 2015-01-27 (×3): qty 1

## 2015-01-27 MED ORDER — PRAVASTATIN SODIUM 40 MG PO TABS
40.0000 mg | ORAL_TABLET | Freq: Every day | ORAL | Status: DC
  Administered 2015-01-28: 40 mg via ORAL
  Filled 2015-01-27: qty 1

## 2015-01-27 MED ORDER — SODIUM CHLORIDE 0.9 % IJ SOLN
3.0000 mL | Freq: Two times a day (BID) | INTRAMUSCULAR | Status: DC
Start: 2015-01-27 — End: 2015-01-28
  Administered 2015-01-27 – 2015-01-28 (×2): 3 mL via INTRAVENOUS

## 2015-01-27 NOTE — Progress Notes (Signed)
Arrived from ER. VS stable . Md was paged for orders

## 2015-01-27 NOTE — ED Notes (Signed)
Pt can go at 14:50

## 2015-01-27 NOTE — ED Provider Notes (Signed)
CSN: [id number]     Arrival date & time 01/27/15  0944 History   First MD Initiated Contact with Patient 01/27/15 0957     Chief Complaint  Patient presents with  . Shortness of Breath     (Consider location/radiation/quality/duration/timing/severity/associated sxs/prior Treatment) HPI Comments: 81 year old femalepresents with worsening shortness of breath and fatigue for the past 4 days. Patient was seen at outside [facility] department on the fourth of an Lasix which improved her symptoms x-ray was done showing mild heart failure. Patient has been taking oral Lasix last dose 40 Motrin this morning however does not feel any better. Patient's breathing worse with walking. Patient talks and drops to high 80s per family. No oxygen at home normally. No known weight gain, patient more swollen in the face and abdomen. No fever or chills. Wet sounding cough per family.  Patient is a 81 y.o. female presenting with shortness of breath. The history is provided by the patient, medical records and a relative.  Shortness of Breath Associated symptoms: cough   Associated symptoms: no abdominal pain, no chest pain, no fever, no headaches, no neck pain, no rash and no vomiting     Past Medical History  Diagnosis Date  . CHF (congestive heart failure)   . A-fib   . Renal disorder   . Diabetes mellitus without complication   . Hypertension   . Dementia    Past Surgical History  Procedure Laterality Date  . Abdominal hysterectomy     History reviewed. No pertinent family history. History  Substance Use Topics  . Smoking status: Never Smoker   . Smokeless tobacco: Not on file  . Alcohol Use: No   OB History    No data available     Review of Systems  Constitutional: Positive for fatigue. Negative for fever and chills.  HENT: Negative for congestion.   Eyes: Negative for visual disturbance.  Respiratory: Positive for cough and shortness of breath.   Cardiovascular: Positive for leg swelling  (mild lower extremities bilateralstable per family). Negative for chest pain.  Gastrointestinal: Negative for vomiting and abdominal pain.  Genitourinary: Negative for dysuria and flank pain.  Musculoskeletal: Negative for back pain, neck pain and neck stiffness.  Skin: Negative for rash.  Neurological: Negative for light-headedness and headaches.      Allergies  Review of patient's allergies indicates no known allergies.  Home Medications   Prior to Admission medications   Medication Sig Start Date End Date Taking? Authorizing Provider  amiodarone (PACERONE) 200 MG tablet Take 200 mg by mouth daily.   Yes Historical Provider, MD  amLODipine (NORVASC) 5 MG tablet Take 1 tablet by mouth daily. 01/26/15  Yes Historical Provider, MD  amLODipine-benazepril (LOTREL) 5-10 MG per capsule Take 1 capsule by mouth daily.   Yes Historical Provider, MD  aspirin 325 MG tablet Take 325 mg by mouth daily.   Yes Historical Provider, MD  calcitRIOL (ROCALTROL) 0.5 MCG capsule Take 1 capsule by mouth 3 (three) times a week. Monday, Wednesday, and Friday only 01/07/15  Yes Historical Provider, MD  docusate sodium (COLACE) 100 MG capsule Take 100 mg by mouth daily as needed for mild constipation.   Yes Historical Provider, MD  donepezil (ARICEPT) 10 MG tablet Take 10 mg by mouth at bedtime.   Yes Historical Provider, MD  furosemide (LASIX) 40 MG tablet Take 1 tablet by mouth daily. 3 day course per PCPs instructions take 40mg  BID 01/25/15-01/27/15 12/17/14  Yes Historical Provider, MD  glimepiride (AMARYL) 4  MG tablet Take 4 mg by mouth daily with breakfast.   Yes Historical Provider, MD  guaiFENesin (MUCINEX) 600 MG 12 hr tablet Take 600 mg by mouth 2 (two) times daily as needed for cough.    Yes Historical Provider, MD  metoprolol succinate (TOPROL-XL) 25 MG 24 hr tablet Take 12.5 mg by mouth daily.   Yes Historical Provider, MD  mirabegron ER (MYRBETRIQ) 50 MG TB24 tablet Take 50 mg by mouth daily.   Yes  Historical Provider, MD  pravastatin (PRAVACHOL) 40 MG tablet Take 40 mg by mouth daily.   Yes Historical Provider, MD  vitamin B-12 (CYANOCOBALAMIN) 500 MCG tablet Take 500 mcg by mouth daily.   Yes Historical Provider, MD  Vitamin D, Ergocalciferol, (DRISDOL) 50000 UNITS CAPS capsule Take 1 capsule by mouth once a week. Mondays 01/07/15  Yes Historical Provider, MD   BP 128/56 mmHg  Pulse 59  Temp(Src) 97.8 F (36.6 C) (Oral)  Resp 16  Wt 217 lb 6 oz (98.601 kg)  SpO2 94% Physical Exam  Constitutional: She is oriented to person, place, and time. She appears well-developed and well-nourished.  HENT:  Head: Normocephalic and atraumatic.  Dry mucous membranes  Eyes: Conjunctivae are normal. Right eye exhibits no discharge. Left eye exhibits no discharge.  Neck: Normal range of motion. Neck supple. No tracheal deviation present.  Cardiovascular: Regular rhythm.  Bradycardia present.   Pulmonary/Chest: Effort normal and breath sounds normal.  Crackles at bases worse right lower no respiratory difficulty  Abdominal: Soft. She exhibits no distension. There is no tenderness. There is no guarding.  Musculoskeletal: She exhibits edema (very mild lower extremities ankles bilateral).  Neurological: She is alert and oriented to person, place, and time.  Skin: Skin is warm. No rash noted.  Psychiatric: She has a normal mood and affect.  Nursing note and vitals reviewed.   ED Course  Procedures (including critical care time) Labs Review Labs Reviewed  BASIC METABOLIC PANEL - Abnormal; Notable for the following:    Glucose, Bld 204 (*)    BUN 38 (*)    Creatinine, Ser 1.99 (*)    GFR calc non Af Amer 22 (*)    GFR calc Af Amer 25 (*)    All other components within normal limits  CBC WITH DIFFERENTIAL/PLATELET - Abnormal; Notable for the following:    Hemoglobin 11.8 (*)    MCH 25.9 (*)    All other components within normal limits  TROPONIN I  BRAIN NATRIURETIC PEPTIDE    Imaging  Review Dg Chest 2 View  01/27/2015   CLINICAL DATA:  Cough and shortness of Breath  EXAM: CHEST - 2 VIEW  COMPARISON:  None.  FINDINGS: Cardiac shadow is mildly enlarged. The lungs are well aerated bilaterally. Very mild interstitial changes are seen. Some mild interstitial edema is noted. No sizable effusion is noted.  IMPRESSION: Mild interstitial edema without focal confluent infiltrate.   Electronically Signed   By: Inez Catalina M.D.   On: 01/27/2015 11:13     EKG Interpretation   Date/Time:  Tuesday January 27 2015 09:52:38 EDT Ventricular Rate:  52 PR Interval:    QRS Duration: 169 QT Interval:  526 QTC Calculation: 489 R Axis:   -8 Text Interpretation:  Junctional rhythm Left bundle branch block Baseline  wander in lead(s) V3 similar previous Confirmed by Kinisha Soper  MD, Eboney Claybrook  ([id number]) on 01/27/2015 10:51:46 AM      MDM   Final diagnoses:  Acute renal failure, unspecified acute  renal failure type  Acute dyspnea  Systolic congestive heart failure, unspecified congestive heart failure chronicity   Patient presents with persistent and worsening shortness of breath and fatigue. Heart failure history no current cardiologist patient will be likely moving to this area to live with family in the near future. Patient has atrial fibrillation and kidney disease history. Dementia history most the history is from the family.  Patient presents with worsening shortness of breath, crackles at the bases) concern for mild heart failure versus pneumonia. Chest x-ray review more consistent with heart failure. No fever. BNP actually low. Mixed picture. With recurrent visit, persistent shortness of breath and worsening renal function discussed with hospitalist for further evaluation and admission.  The patients results and plan were reviewed and discussed.   Any x-rays performed were personally reviewed by myself.   Differential diagnosis were considered with the presenting HPI.  Medications  furosemide  (LASIX) injection 40 mg (40 mg Intravenous Given 01/27/15 1201)    Filed Vitals:   01/27/15 0950 01/27/15 1002 01/27/15 1145  BP: 138/52  128/56  Pulse: 51  59  Temp: 97.8 F (36.6 C)    TempSrc: Oral    Resp: 15  16  Weight:  217 lb 6 oz (98.601 kg)   SpO2: 100%  94%    Final diagnoses:  Acute renal failure, unspecified acute renal failure type  Acute dyspnea  Systolic congestive heart failure, unspecified congestive heart failure chronicity    Admission/ observation were discussed with the admitting physician, patient and/or family and they are comfortable with the plan.    Elnora Morrison, MD 01/27/15 [phone]

## 2015-01-27 NOTE — Progress Notes (Signed)
*  PRELIMINARY RESULTS* Vascular Ultrasound Lower extremity venous duplex has been completed.  Preliminary findings: negative for DVT  Landry Mellow, RDMS, RVT  01/27/2015, 2:33 PM

## 2015-01-27 NOTE — ED Notes (Signed)
Pt's daughter states that pt was seen at [facility] on 6/4.  States that she was given lasix IV there and "felt better" from her SOB.  Daughter states that they started her on 40 mg PO lasix at home.  States that she has been coughing more and daughter has not seen any improvement in the pt's breathing since being discharged from [facility].  Pt denies complaint.

## 2015-01-27 NOTE — ED Notes (Addendum)
Pt pulse ox 93 while ambulating. After walking pt had to sit up in bed for a few before laying back down.

## 2015-01-27 NOTE — ED Notes (Signed)
According to pt's daughter, she states that the pt's urologist and nephrologist believe her natural flora always show as a UTI and that they want her to "ride it out" unless she is symptomatic or her "numbers are really high"

## 2015-01-27 NOTE — H&P (Signed)
PATIENT DETAILS Name: Anna [name] Age: 81 y.o. Sex: female Date of Birth: 09-01-1928 Admit Date: 01/27/2015 PCP:Pcp Not In System Referring Physician:Dr Zavitz   CHIEF COMPLAINT:  Cough, Exertional dyspnea and failure to thrive synd  HPI: Anna [name] is a 81 y.o. female with a Past Medical History of paroxysmal atrial fibrillation (not on anticoagulation due to recent GI bleed), dementia, hypertension, chronic kidney disease stage III who presents today with the above noted complaint. Please note, patient is a poor historian because of dementia, most of this history is obtained from the patient's daughter at bedside. Per patient's daughter, patient is visiting her and lives in [place]. Patient has a history of atrial fibrillation-a few months ago she was on Coumadin and had a GI bleed, subsequently was placed on aspirin. Patient also has stage III chronic kidney disease with last creatinine in March 2016 at 1.88 (daughter had paperwork). Approximately 2 weeks back patient had cough, rhinorrhea and nasal congestion, patient's daughter called her family doctor in [place] and was prescribed Ceftin. Unfortunately, although improved, she has not come back to her baseline. A few days back, the daughter noted the patient was mostly short of breath on exertion and has to clear to [facility] which she was given Lasix and discharged. Patient continues to have cough and intermittent exertional dyspnea, she also exhibits fted type syndrome hence patient was brought back to the emergency room and I was asked to admit this patient for further evaluation and treatment There is no history of fever, nausea, vomiting or diarrhea. No history of abdominal pain.   ALLERGIES:  No Known Allergies  PAST MEDICAL HISTORY: Past Medical History  Diagnosis Date  . CHF (congestive heart failure)   . A-fib   . Renal disorder   . Diabetes mellitus without complication   . Hypertension     . Dementia     PAST SURGICAL HISTORY: Past Surgical History  Procedure Laterality Date  . Abdominal hysterectomy      MEDICATIONS AT HOME: Prior to Admission medications   Medication Sig Start Date End Date Taking? Authorizing Provider  amiodarone (PACERONE) 200 MG tablet Take 200 mg by mouth daily.   Yes Historical Provider, MD  amLODipine (NORVASC) 5 MG tablet Take 1 tablet by mouth daily. 01/26/15  Yes Historical Provider, MD  amLODipine-benazepril (LOTREL) 5-10 MG per capsule Take 1 capsule by mouth daily.   Yes Historical Provider, MD  aspirin 325 MG tablet Take 325 mg by mouth daily.   Yes Historical Provider, MD  calcitRIOL (ROCALTROL) 0.5 MCG capsule Take 1 capsule by mouth 3 (three) times a week. Monday, Wednesday, and Friday only 01/07/15  Yes Historical Provider, MD  docusate sodium (COLACE) 100 MG capsule Take 100 mg by mouth daily as needed for mild constipation.   Yes Historical Provider, MD  donepezil (ARICEPT) 10 MG tablet Take 10 mg by mouth at bedtime.   Yes Historical Provider, MD  furosemide (LASIX) 40 MG tablet Take 1 tablet by mouth daily. 3 day course per PCPs instructions take 40mg  BID 01/25/15-01/27/15 12/17/14  Yes Historical Provider, MD  glimepiride (AMARYL) 4 MG tablet Take 4 mg by mouth daily with breakfast.   Yes Historical Provider, MD  guaiFENesin (MUCINEX) 600 MG 12 hr tablet Take 600 mg by mouth 2 (two) times daily as needed for cough.    Yes Historical Provider, MD  metoprolol succinate (TOPROL-XL) 25 MG 24 hr tablet Take 12.5  mg by mouth daily.   Yes Historical Provider, MD  mirabegron ER (MYRBETRIQ) 50 MG TB24 tablet Take 50 mg by mouth daily.   Yes Historical Provider, MD  pravastatin (PRAVACHOL) 40 MG tablet Take 40 mg by mouth daily.   Yes Historical Provider, MD  vitamin B-12 (CYANOCOBALAMIN) 500 MCG tablet Take 500 mcg by mouth daily.   Yes Historical Provider, MD  Vitamin D, Ergocalciferol, (DRISDOL) 50000 UNITS CAPS capsule Take 1 capsule by mouth once  a week. Mondays 01/07/15  Yes Historical Provider, MD    FAMILY HISTORY: No family history of CAD  SOCIAL HISTORY:  reports that she has never smoked. She does not have any smokeless tobacco history on file. She reports that she does not drink alcohol. Her drug history is not on file. Lives at: Home Mobility: Independent  REVIEW OF SYSTEMS:  Constitutional:   No  weight loss, night sweats,  Fevers, chills  HEENT:    No headaches, Dysphagia,Tooth/dental problems,Sore throat,   Cardio-vascular: No chest pain, PND,lower extremity edema, anasarca, palpitations  GI:  No heartburn, indigestion, abdominal pain, nausea, vomiting, diarrhea, melena or hematochezia  Resp: No  cough, hemoptysis,plueritic chest pain.   Skin:  No rash or lesions.  GU:  No dysuria, change in color of urine, no urgency or frequency.  No flank pain.  Musculoskeletal: No joint pain or swelling.  No decreased range of motion.  No back pain.  Endocrine: No heat intolerance, no cold intolerance, no polyuria, no polydipsia  Psych: No change in mood or affect. No depression or anxiety.  No memory loss.   PHYSICAL EXAM: Blood pressure 128/56, pulse 59, temperature 97.8 F (36.6 C), temperature source Oral, resp. rate 16, weight 98.601 kg (217 lb 6 oz), SpO2 94 %.  General appearance :Awake, alert-but pleasantly confused, not in any distress. Speech Clear. Not toxic Looking HEENT: Atraumatic and Normocephalic, pupils equally reactive to light and accomodation Neck: supple, no JVD. No cervical lymphadenopathy.  Chest:Good air entry bilaterally, no added sounds  CVS: S1 S2 regular, no murmurs.  Abdomen: Bowel sounds present, Non tender and not distended with no gaurding, rigidity or rebound. Extremities: B/L Lower Ext shows no edema, both legs are warm to touch Neurology:  Non focal Skin:No Rash Wounds:N/A  LABS ON ADMISSION:   Recent Labs  01/24/15 1545 01/27/15 1020  NA 140 141  K 3.9 3.7  CL  105 104  CO2 26 27  GLUCOSE 199* 204*  BUN 30* 38*  CREATININE 1.81* 1.99*  CALCIUM 8.8* 9.3    Recent Labs  01/24/15 1545  AST 21  ALT 18  ALKPHOS 90  BILITOT 0.4  PROT 7.3  ALBUMIN 3.8   No results for input(s): LIPASE, AMYLASE in the last 72 hours.  Recent Labs  01/24/15 1545 01/27/15 1020  WBC 7.0 5.7  NEUTROABS 4.9 3.7  HGB 11.6* 11.8*  HCT 38.4 39.0  MCV 87.3 85.7  PLT 215 211    Recent Labs  01/24/15 1545 01/27/15 1020  TROPONINI <0.03 <0.03   No results for input(s): DDIMER in the last 72 hours. Invalid input(s): POCBNP   RADIOLOGIC STUDIES ON ADMISSION: Dg Chest 2 View  01/27/2015   CLINICAL DATA:  Cough and shortness of Breath  EXAM: CHEST - 2 VIEW  COMPARISON:  None.  FINDINGS: Cardiac shadow is mildly enlarged. The lungs are well aerated bilaterally. Very mild interstitial changes are seen. Some mild interstitial edema is noted. No sizable effusion is noted.  IMPRESSION: Mild interstitial  edema without focal confluent infiltrate.   Electronically Signed   By: Inez Catalina M.D.   On: 01/27/2015 11:13    I have personally reviewed images of chest xray    EKG: Personally reviewed. Left bundle-branch block-sinus bradycardia  ASSESSMENT AND PLAN: Present on Admission:  . Dyspnea: Doubt CHF, suspect approximately 2 weeks but patient probably had atypical pneumonitis (when she had cough/low-grade fever and was prescribed Ceftin) that is slowly resolving (per daughter-much improvedthan 2 weeks-but not back tobaseline). Although chest x-ray is suggestive of CHF, she has no volume overload on exam, furthermore her BNP is only 26. She has already been given 1 dose of IV Lasix in the emergency room, I would hold off on further diuretics. Check an echocardiogram, and lower extremity Dopplers (recent travel). Will observe overnight in telemetry, if no major events, suspect can be discharged home in the morning  . Failure to thrive in adult: Likely secondary to  recent atypical pneumonitis. PT evaluation.   Marland Kitchen PAF (paroxysmal atrial fibrillation): Continue amiodarone and aspirin. Per daughter, after discussion with her cardiologist and PCP in [place], family has decided to forego anticoagulation and just continue on aspirin. Monitor in telemetry, will hold metoprolol given bradycardia. Cautiously continue with amiodarone.  . Benign essential HTN: Continue with amlodipine   . DM (diabetes mellitus) type II controlled with renal manifestation: Hold oral hypoglycemics, place on SSI.   Marland Kitchen CKD (chronic kidney disease), stage III: Last creatinine in March around 1.88, creatinine close to usual baseline. Recheck electrolytes and.   Further plan will depend as patient's clinical course evolves and further radiologic and laboratory data become available. Patient will be monitored closely.   Above noted plan was discussed with patient/family face to face at bedside, they were in agreement.   CONSULTS: None  DVT Prophylaxis: Prophylactic  Heparin  Code Status: Full Code  Disposition Plan:  Discharge back home in 1 day  Total time spent  55 minutes.Greater than 50% of this time was spent in counseling, explanation of diagnosis, planning of further management, and coordination of care.  [facility] Pager [phone]  If 7PM-7AM, please contact night-coverage [email or web address] Password [email or web address] 01/27/2015, 2:18 PM

## 2015-01-28 ENCOUNTER — Observation Stay (HOSPITAL_COMMUNITY): Payer: Medicare HMO

## 2015-01-28 DIAGNOSIS — R0602 Shortness of breath: Secondary | ICD-10-CM

## 2015-01-28 DIAGNOSIS — I10 Essential (primary) hypertension: Secondary | ICD-10-CM

## 2015-01-28 DIAGNOSIS — E0590 Thyrotoxicosis, unspecified without thyrotoxic crisis or storm: Secondary | ICD-10-CM

## 2015-01-28 DIAGNOSIS — I4891 Unspecified atrial fibrillation: Secondary | ICD-10-CM | POA: Diagnosis not present

## 2015-01-28 DIAGNOSIS — N183 Chronic kidney disease, stage 3 (moderate): Secondary | ICD-10-CM

## 2015-01-28 LAB — BASIC METABOLIC PANEL
Anion gap: 9 (ref 5–15)
BUN: 36 mg/dL — ABNORMAL HIGH (ref 6–20)
CO2: 29 mmol/L (ref 22–32)
CREATININE: 1.87 mg/dL — AB (ref 0.44–1.00)
Calcium: 9.1 mg/dL (ref 8.9–10.3)
Chloride: 106 mmol/L (ref 101–111)
GFR calc Af Amer: 27 mL/min — ABNORMAL LOW (ref >60)
GFR, EST NON AFRICAN AMERICAN: 24 mL/min — AB (ref >60)
Glucose, Bld: 79 mg/dL (ref 65–99)
Potassium: 3.4 mmol/L — ABNORMAL LOW (ref 3.5–5.1)
SODIUM: 144 mmol/L (ref 135–145)

## 2015-01-28 LAB — CBC
HCT: 37.5 % (ref 36.0–46.0)
Hemoglobin: 11.5 g/dL — ABNORMAL LOW (ref 12.0–15.0)
MCH: 26.5 pg (ref 26.0–34.0)
MCHC: 30.7 g/dL (ref 30.0–36.0)
MCV: 86.4 fL (ref 78.0–100.0)
Platelets: 213 10*3/uL (ref 150–400)
RBC: 4.34 MIL/uL (ref 3.87–5.11)
RDW: 14.7 % (ref 11.5–15.5)
WBC: 6 10*3/uL (ref 4.0–10.5)

## 2015-01-28 LAB — T4, FREE: FREE T4: 2.33 ng/dL — AB (ref 0.61–1.12)

## 2015-01-28 LAB — TROPONIN I: Troponin I: <0.03 ng/mL (ref <0.031)

## 2015-01-28 LAB — GLUCOSE, CAPILLARY
Glucose-Capillary: 95 mg/dL (ref 65–99)
Glucose-Capillary: 177 mg/dL — ABNORMAL HIGH (ref 65–99)

## 2015-01-28 MED ORDER — FUROSEMIDE 40 MG PO TABS: 40.0000 mg | ORAL_TABLET | Freq: Every day | ORAL | Status: DC

## 2015-01-28 MED ORDER — POTASSIUM CHLORIDE ER 10 MEQ PO TBCR: 20.0000 meq | EXTENDED_RELEASE_TABLET | Freq: Every day | ORAL | Status: DC

## 2015-01-28 MED ORDER — INSULIN ASPART 100 UNIT/ML FLEXPEN: PEN_INJECTOR | SUBCUTANEOUS | Status: DC

## 2015-01-28 MED ORDER — POTASSIUM CHLORIDE CRYS ER 20 MEQ PO TBCR
40.0000 meq | EXTENDED_RELEASE_TABLET | Freq: Once | ORAL | Status: AC
  Administered 2015-01-28: 40 meq via ORAL
  Filled 2015-01-28: qty 2

## 2015-01-28 NOTE — Evaluation (Signed)
Physical Therapy Evaluation Patient Details Name: Anna [name] MRN: [id number] DOB: 1929/07/08 Today's Date: 01/28/2015   History of Present Illness  Anna [name] is a 81 y.o. female with a Past Medical History of paroxysmal atrial fibrillation (not on anticoagulation due to recent GI bleed), dementia, hypertension, chronic kidney disease stage III  who was admitted with SOB on exertion. Dx of pneumonitis.  Clinical Impression  Pt ambulated 24' with RW and min assist for balance/safety. SaO2 97% with walking, distance limited by fatigue. Pt's daughter is a CNA and stated she will work with her mother on walking and exercises that a PT showed her in the past, so declined HHPT. From PT standpoint pt can DC home with 24* assist from her daughter.     Follow Up Recommendations No PT follow up    Equipment Recommendations  None recommended by PT    Recommendations for Other Services       Precautions / Restrictions Precautions Precautions: Fall Restrictions Weight Bearing Restrictions: No      Mobility  Bed Mobility               General bed mobility comments: NT-up in bathroom with daughter  Transfers Overall transfer level: Needs assistance Equipment used: Rolling walker (2 wheeled) Transfers: Sit to/from Stand Sit to Stand: Min guard         General transfer comment: cues for hand placement, min A for stand to sit to guide hips as pt attempted to sit prior to reaching recliner  Ambulation/Gait Ambulation/Gait assistance: Min assist Ambulation Distance (Feet): 70 Feet Assistive device: Rolling walker (2 wheeled) Gait Pattern/deviations: Step-through pattern;Trunk flexed   Gait velocity interpretation: at or above normal speed for age/gender General Gait Details: verbal cues to step into frame of RW and to lift head/chest due to flexed trunk  Stairs            Wheelchair Mobility    Modified Rankin (Stroke Patients Only)       Balance Overall  balance assessment: History of Falls (daughter reports h/o 2 falls in past year)                                           Pertinent Vitals/Pain Pain Assessment: No/denies pain    Home Living Family/patient expects to be discharged to:: Private residence Living Arrangements: Children Available Help at Discharge: Family;Available 24 hours/day Type of Home: House Home Access: Stairs to enter Entrance Stairs-Rails: Right Entrance Stairs-Number of Steps: 2 Home Layout: Able to live on main level with bedroom/bathroom Home Equipment: Walker - 2 wheels;Wheelchair - manual;Shower seat;Bedside commode      Prior Function Level of Independence: Needs assistance   Gait / Transfers Assistance Needed: walked with RW  ADL's / Homemaking Assistance Needed: assist for bathing/dressing        Hand Dominance        Extremity/Trunk Assessment   Upper Extremity Assessment: Overall WFL for tasks assessed           Lower Extremity Assessment: Overall WFL for tasks assessed      Cervical / Trunk Assessment: Normal  Communication   Communication: No difficulties  Cognition Arousal/Alertness: Awake/alert Behavior During Therapy: WFL for tasks assessed/performed Overall Cognitive Status: History of cognitive impairments - at baseline (dementia)  General Comments      Exercises        Assessment/Plan    PT Assessment Patent does not need any further PT services  PT Diagnosis Generalized weakness   PT Problem List    PT Treatment Interventions     PT Goals (Current goals can be found in the Care Plan section) Acute Rehab PT Goals Patient Stated Goal: to be able to go out to eat PT Goal Formulation: With patient/family    Frequency     Barriers to discharge        Co-evaluation               End of Session Equipment Utilized During Treatment: Gait belt Activity Tolerance: Patient limited by fatigue Patient  left: in chair;with call bell/phone within reach;with family/visitor present Nurse Communication: Mobility status    Functional Assessment Tool Used: clinical judgement Functional Limitation: Mobility: Walking and moving around Mobility: Walking and Moving Around Current Status (L5974): At least 20 percent but less than 40 percent impaired, limited or restricted Mobility: Walking and Moving Around Goal Status [phone]): At least 1 percent but less than 20 percent impaired, limited or restricted    Time: 0158-6825 PT Time Calculation (min) (ACUTE ONLY): 21 min   Charges:   PT Evaluation $Initial PT Evaluation Tier I: 1 Procedure     PT G Codes:   PT G-Codes **NOT FOR INPATIENT CLASS** Functional Assessment Tool Used: clinical judgement Functional Limitation: Mobility: Walking and moving around Mobility: Walking and Moving Around Current Status (R4935): At least 20 percent but less than 40 percent impaired, limited or restricted Mobility: Walking and Moving Around Goal Status [phone]): At least 1 percent but less than 20 percent impaired, limited or restricted    Philomena Doheny 01/28/2015, 10:06 AM [phone]

## 2015-01-28 NOTE — Discharge Summary (Addendum)
Physician Discharge Summary  Anna [name] DUK:[id number] DOB: 1928-10-09 DOA: 01/27/2015  PCP: Pcp Not In System  Admit date: 01/27/2015 Discharge date: 01/28/2015  Time spent: 35 minutes  Recommendations for Outpatient Follow-up:  TSH/free t4 6 weeks Monitor blood sugars and bring to PCP BMP 2 weeks re K - if patient remains in [place] will need to establish with cardiology  Discharge Diagnoses:  Principal Problem:   SOB (shortness of breath) Active Problems:   Dyspnea   Failure to thrive in adult   PAF (paroxysmal atrial fibrillation)   Benign essential HTN   DM (diabetes mellitus) type II controlled with renal manifestation   CKD (chronic kidney disease), stage III   Hyperthyroidism   Discharge Condition: improved  Diet recommendation: cardiac/diabetic  Filed Weights   01/27/15 1002 01/27/15 1514 01/28/15 1123  Weight: 98.601 kg (217 lb 6 oz) 98.6 kg (217 lb 6 oz) 97.705 kg (215 lb 6.4 oz)    History of present illness:  Anna [name] is a 81 y.o. female with a Past Medical History of paroxysmal atrial fibrillation (not on anticoagulation due to recent GI bleed), dementia, hypertension, chronic kidney disease stage III who presents today with the above noted complaint. Please note, patient is a poor historian because of dementia, most of this history is obtained from the patient's daughter at bedside. Per patient's daughter, patient is visiting her and lives in [place]. Patient has a history of atrial fibrillation-a few months ago she was on Coumadin and had a GI bleed, subsequently was placed on aspirin. Patient also has stage III chronic kidney disease with last creatinine in March 2016 at 1.88 (daughter had paperwork). Approximately 2 weeks back patient had cough, rhinorrhea and nasal congestion, patient's daughter called her family doctor in [place] and was prescribed Ceftin. Unfortunately, although improved, she has not come back to her baseline. A few days back, the daughter  noted the patient was mostly short of breath on exertion and has to clear to [facility] which she was given Lasix and discharged. Patient continues to have cough and intermittent exertional dyspnea, she also exhibits fted type syndrome hence patient was brought back to the emergency room and I was asked to admit this patient for further evaluation and treatment There is no history of fever, nausea, vomiting or diarrhea. No history of abdominal pain.   Hospital Course:  Dyspnea: Doubt CHF, suspect approximately 2 weeks but patient probably had atypical pneumonitis (when she had cough/low-grade fever and was prescribed Ceftin) that is slowly resolving (per daughter-much improvedthan 2 weeks-but not back tobaseline). Although chest x-ray is suggestive of CHF, she has no volume overload on exam, furthermore her BNP is only 26. She has already been given 1 dose of IV Lasix in the emergency room -resume home lasix Echocardiogram- see below lower extremity Dopplers negative.   Hyperthyroidism -prob due to amiodarone -discussed with on call cards -d/c amio -check TSH 6 weeks -patient on BB - updated PCP in [place]- Dr. Alanda Amass  . Failure to thrive in adult: Likely secondary to recent atypical pneumonitis. PT evaluation- no needs   . PAF (paroxysmal atrial fibrillation):   family has decided to forego anticoagulation and just continue on aspirin.  -continue BB with monitoring by daughter  . Benign essential HTN: Continue with amlodipine  . DM (diabetes mellitus) type II controlled with renal manifestation: continue or with SSI  . CKD (chronic kidney disease), stage III: Last creatinine in March around 1.88, creatinine close to usual  baseline.    Procedures:  Echo: Study Conclusions  - Left ventricle: Septal dyssynergy (IVCD). The cavity size was normal. Wall thickness was increased in a pattern of mild LVH. The estimated ejection fraction was 55%. - Left atrium: The  atrium was mildly dilated. - Right ventricle: The cavity size was normal. Systolic function was normal.  Consultations:    Discharge Exam: Filed Vitals:   01/28/15 1406  BP: 143/62  Pulse: 58  Temp: 98.1 F (36.7 C)  Resp: 18    General: elderly, NAD   Discharge Instructions   Discharge Instructions    Diet - low sodium heart healthy    Complete by:  As directed      Diet Carb Modified    Complete by:  As directed      Discharge instructions    Complete by:  As directed   24 hour supervision by family Need to establish PCP here (family to call Dr. Osborne Casco their PCP) Monitor heart rate if < 55 hold metoprolol     Increase activity slowly    Complete by:  As directed           Current Discharge Medication List    START taking these medications   Details  insulin aspart (NOVOLOG FLEXPEN) 100 UNIT/ML FlexPen CBG 70 - 120: 0 units CBG 121 - 150: 1 unit CBG 151 - 200: 2 units CBG 201 - 250: 3 units CBG 251 - 300: 5 units CBG 301 - 350: 7 units CBG 351 - 400: 9 units   Please include needles Qty: 15 mL, Refills: 0    potassium chloride (K-DUR) 10 MEQ tablet Take 2 tablets (20 mEq total) by mouth daily. Qty: 30 tablet, Refills: 0      CONTINUE these medications which have NOT CHANGED   Details  amLODipine (NORVASC) 5 MG tablet Take 1 tablet by mouth daily.    aspirin 325 MG tablet Take 325 mg by mouth daily.    calcitRIOL (ROCALTROL) 0.5 MCG capsule Take 1 capsule by mouth 3 (three) times a week. Monday, Wednesday, and Friday only    docusate sodium (COLACE) 100 MG capsule Take 100 mg by mouth daily as needed for mild constipation.    donepezil (ARICEPT) 10 MG tablet Take 10 mg by mouth at bedtime.    furosemide (LASIX) 40 MG tablet Take 1 tablet by mouth daily. 3 day course per PCPs instructions take 40mg  BID 01/25/15-01/27/15    glimepiride (AMARYL) 4 MG tablet Take 4 mg by mouth daily with breakfast.    guaiFENesin (MUCINEX) 600 MG 12 hr tablet Take  600 mg by mouth 2 (two) times daily as needed for cough.     metoprolol succinate (TOPROL-XL) 25 MG 24 hr tablet Take 12.5 mg by mouth daily.    mirabegron ER (MYRBETRIQ) 50 MG TB24 tablet Take 50 mg by mouth daily.    pravastatin (PRAVACHOL) 40 MG tablet Take 40 mg by mouth daily.    vitamin B-12 (CYANOCOBALAMIN) 500 MCG tablet Take 500 mcg by mouth daily.    Vitamin D, Ergocalciferol, (DRISDOL) 50000 UNITS CAPS capsule Take 1 capsule by mouth once a week. Mondays      STOP taking these medications     amiodarone (PACERONE) 200 MG tablet      amLODipine-benazepril (LOTREL) 5-10 MG per capsule        No Known Allergies Follow-up Information    Please follow up.   Why:  family to try to follow up with  Dr. Osborne Casco- their PCP       The results of significant diagnostics from this hospitalization (including imaging, microbiology, ancillary and laboratory) are listed below for reference.    Significant Diagnostic Studies: Dg Chest 2 View  01/27/2015   CLINICAL DATA:  Cough and shortness of Breath  EXAM: CHEST - 2 VIEW  COMPARISON:  None.  FINDINGS: Cardiac shadow is mildly enlarged. The lungs are well aerated bilaterally. Very mild interstitial changes are seen. Some mild interstitial edema is noted. No sizable effusion is noted.  IMPRESSION: Mild interstitial edema without focal confluent infiltrate.   Electronically Signed   By: Inez Catalina M.D.   On: 01/27/2015 11:13   Dg Chest 2 View  01/24/2015   CLINICAL DATA:  Reason URI, continued congestion, history CHF, atrial fibrillation, dementia, diabetes, hypertension  EXAM: CHEST  2 VIEW  COMPARISON:  None  FINDINGS: Enlargement of cardiac silhouette with pulmonary vascular congestion.  Scattered interstitial infiltrates likely pulmonary edema and CHF.  Minimal bibasilar atelectasis.  No segmental consolidation, pleural effusion or pneumothorax.  IMPRESSION: Probable CHF with minimal bibasilar atelectasis.   Electronically Signed   By:  Lavonia Dana M.D.   On: 01/24/2015 14:40    Microbiology: No results found for this or any previous visit (from the past 240 hour(s)).   Labs: Basic Metabolic Panel:  Recent Labs Lab 01/24/15 1545 01/27/15 1020 01/27/15 1635 01/28/15 0335  NA 140 141  --  144  K 3.9 3.7  --  3.4*  CL 105 104  --  106  CO2 26 27  --  29  GLUCOSE 199* 204*  --  79  BUN 30* 38*  --  36*  CREATININE 1.81* 1.99* 2.04* 1.87*  CALCIUM 8.8* 9.3  --  9.1   Liver Function Tests:  Recent Labs Lab 01/24/15 1545  AST 21  ALT 18  ALKPHOS 90  BILITOT 0.4  PROT 7.3  ALBUMIN 3.8   No results for input(s): LIPASE, AMYLASE in the last 168 hours. No results for input(s): AMMONIA in the last 168 hours. CBC:  Recent Labs Lab 01/24/15 1545 01/27/15 1020 01/27/15 1635 01/28/15 0335  WBC 7.0 5.7 6.2 6.0  NEUTROABS 4.9 3.7  --   --   HGB 11.6* 11.8* 12.3 11.5*  HCT 38.4 39.0 39.1 37.5  MCV 87.3 85.7 85.2 86.4  PLT 215 211 234 213   Cardiac Enzymes:  Recent Labs Lab 01/24/15 1545 01/27/15 1020 01/27/15 1635 01/27/15 2142 01/28/15 0335  TROPONINI <0.03 <0.03 <0.03 <0.03 <0.03   BNP: BNP (last 3 results)  Recent Labs  01/24/15 1545 01/27/15 1020  BNP 71.4 26.4    ProBNP (last 3 results) No results for input(s): PROBNP in the last 8760 hours.  CBG:  Recent Labs Lab 01/27/15 1435 01/27/15 2101 01/28/15 0747 01/28/15 1213  GLUCAP 124* 147* 95 177*       Signed:  VANN, JESSICA  [facility] 01/28/2015, 4:02 PM

## 2015-01-28 NOTE — Progress Notes (Signed)
Echocardiogram 2D Echocardiogram has been performed.  Joelene Millin 01/28/2015, 11:10 AM

## 2015-02-12 ENCOUNTER — Emergency Department (HOSPITAL_COMMUNITY)
Admission: EM | Admit: 2015-02-12 | Discharge: 2015-02-12 | Disposition: A | Discharge status: Home / Self Care | Payer: Medicare HMO | Attending: Emergency Medicine | Admitting: Emergency Medicine

## 2015-02-12 ENCOUNTER — Encounter (HOSPITAL_COMMUNITY): Payer: Self-pay | Admitting: *Deleted

## 2015-02-12 DIAGNOSIS — Z87448 Personal history of other diseases of urinary system: Secondary | ICD-10-CM | POA: Insufficient documentation

## 2015-02-12 DIAGNOSIS — E119 Type 2 diabetes mellitus without complications: Secondary | ICD-10-CM | POA: Diagnosis not present

## 2015-02-12 DIAGNOSIS — I480 Paroxysmal atrial fibrillation: Secondary | ICD-10-CM | POA: Insufficient documentation

## 2015-02-12 DIAGNOSIS — Z79899 Other long term (current) drug therapy: Secondary | ICD-10-CM | POA: Diagnosis not present

## 2015-02-12 DIAGNOSIS — Z792 Long term (current) use of antibiotics: Secondary | ICD-10-CM | POA: Insufficient documentation

## 2015-02-12 DIAGNOSIS — I4891 Unspecified atrial fibrillation: Secondary | ICD-10-CM | POA: Diagnosis present

## 2015-02-12 DIAGNOSIS — I509 Heart failure, unspecified: Secondary | ICD-10-CM | POA: Diagnosis not present

## 2015-02-12 DIAGNOSIS — F0390 Unspecified dementia without behavioral disturbance: Secondary | ICD-10-CM | POA: Insufficient documentation

## 2015-02-12 DIAGNOSIS — I10 Essential (primary) hypertension: Secondary | ICD-10-CM | POA: Insufficient documentation

## 2015-02-12 LAB — BASIC METABOLIC PANEL
ANION GAP: 8 (ref 5–15)
BUN: 30 mg/dL — AB (ref 6–20)
CALCIUM: 9.2 mg/dL (ref 8.9–10.3)
CHLORIDE: 108 mmol/L (ref 101–111)
CO2: 25 mmol/L (ref 22–32)
CREATININE: 1.61 mg/dL — AB (ref 0.44–1.00)
GFR calc Af Amer: 33 mL/min — ABNORMAL LOW (ref >60)
GFR calc non Af Amer: 28 mL/min — ABNORMAL LOW (ref >60)
Glucose, Bld: 206 mg/dL — ABNORMAL HIGH (ref 65–99)
Potassium: 4.1 mmol/L (ref 3.5–5.1)
Sodium: 141 mmol/L (ref 135–145)

## 2015-02-12 LAB — CBC
HCT: 40.1 % (ref 36.0–46.0)
Hemoglobin: 12.3 g/dL (ref 12.0–15.0)
MCH: 25.9 pg — ABNORMAL LOW (ref 26.0–34.0)
MCHC: 30.7 g/dL (ref 30.0–36.0)
MCV: 84.6 fL (ref 78.0–100.0)
PLATELETS: 177 10*3/uL (ref 150–400)
RBC: 4.74 MIL/uL (ref 3.87–5.11)
RDW: 14.4 % (ref 11.5–15.5)
WBC: 5.8 10*3/uL (ref 4.0–10.5)

## 2015-02-12 LAB — T4, FREE: Free T4: 2.27 ng/dL — ABNORMAL HIGH (ref 0.61–1.12)

## 2015-02-12 LAB — I-STAT TROPONIN, ED: Troponin i, poc: 0 ng/mL (ref 0.00–0.08)

## 2015-02-12 LAB — TSH: TSH: 0.018 u[IU]/mL — ABNORMAL LOW (ref 0.350–4.500)

## 2015-02-12 NOTE — ED Notes (Signed)
Pt and family reports pt is from [place], has been staying in [place] with family for last 6 months. Pt went to ED on 6/4 and was dx with CHF, hx of afib. Pt was seen and admitted to hospital 6/6 for overnight observation. Pt was dx with hyperthyroidism and amiodorone medication discontinued. Family consulted pts pcp in [place] who did blood work and said pt had hypothyroidism. Today daughter called pcp in [place] because pts heart rate kept ranging from 60-107. pcp in [place] told family to bring pt to ED.   Pt denies n/v/d, denies pain. Denies SOB.

## 2015-02-12 NOTE — ED Provider Notes (Signed)
CSN: [id number]     Arrival date & time 02/12/15  1154 History   First MD Initiated Contact with Patient 02/12/15 1226     Chief Complaint  Patient presents with  . A-fib      (Consider location/radiation/quality/duration/timing/severity/associated sxs/prior Treatment) HPI 81 y.o. female presents with asymptomatic irregular HR from home.  Has a ho paroxysmal afib and has had both hyper and hypothyroid.  She had a recent admission for dyspnea and was taken off amiodarone.  No recent fevers, chest pain, dyspnea, or other symptoms.  HR reported between 50 and 110 BPM by daughter, who is very dillligent with taking HR.  When daughter called the patient's primary care provider she was informed to present to the hospital. Timing of symptoms intermittent. Duration approximately 2 hours. No exacerbating or alleviating factors.  Past Medical History  Diagnosis Date  . CHF (congestive heart failure)   . A-fib   . Renal disorder   . Diabetes mellitus without complication   . Hypertension   . Dementia    Past Surgical History  Procedure Laterality Date  . Abdominal hysterectomy     History reviewed. No pertinent family history. History  Substance Use Topics  . Smoking status: Never Smoker   . Smokeless tobacco: Not on file  . Alcohol Use: No   OB History    No data available     Review of Systems  All other systems reviewed and are negative.     Allergies  Review of patient's allergies indicates no known allergies.  Home Medications   Prior to Admission medications   Medication Sig Start Date End Date Taking? Authorizing Provider  amLODipine (NORVASC) 5 MG tablet Take 1 tablet by mouth daily. 01/26/15  Yes Historical Provider, MD  aspirin 325 MG tablet Take 325 mg by mouth daily.   Yes Historical Provider, MD  calcitRIOL (ROCALTROL) 0.5 MCG capsule Take 0.5 mcg by mouth 3 (three) times a week. Monday, Wednesday, and Friday only 01/07/15  Yes Historical Provider, MD  docusate  sodium (COLACE) 100 MG capsule Take 100 mg by mouth daily as needed for mild constipation.   Yes Historical Provider, MD  donepezil (ARICEPT) 10 MG tablet Take 10 mg by mouth at bedtime.   Yes Historical Provider, MD  furosemide (LASIX) 40 MG tablet Take 40 mg by mouth daily. 3 day course per PCPs instructions take 40mg  BID 01/25/15-01/27/15 12/17/14  Yes Historical Provider, MD  glimepiride (AMARYL) 4 MG tablet Take 4 mg by mouth daily with breakfast.   Yes Historical Provider, MD  guaiFENesin (MUCINEX) 600 MG 12 hr tablet Take 600 mg by mouth 2 (two) times daily as needed for cough.    Yes Historical Provider, MD  metoprolol succinate (TOPROL-XL) 25 MG 24 hr tablet Take 12.5 mg by mouth daily.   Yes Historical Provider, MD  mirabegron ER (MYRBETRIQ) 50 MG TB24 tablet Take 50 mg by mouth daily.   Yes Historical Provider, MD  potassium chloride (K-DUR) 10 MEQ tablet Take 2 tablets (20 mEq total) by mouth daily. 01/28/15  Yes Geradine Girt, DO  pravastatin (PRAVACHOL) 40 MG tablet Take 40 mg by mouth daily.   Yes Historical Provider, MD  vitamin B-12 (CYANOCOBALAMIN) 500 MCG tablet Take 500 mcg by mouth daily.   Yes Historical Provider, MD  Vitamin D, Ergocalciferol, (DRISDOL) 50000 UNITS CAPS capsule Take 50,000 Units by mouth once a week. Mondays 01/07/15  Yes Historical Provider, MD  insulin aspart (NOVOLOG FLEXPEN) 100 UNIT/ML FlexPen CBG  70 - 120: 0 units CBG 121 - 150: 1 unit CBG 151 - 200: 2 units CBG 201 - 250: 3 units CBG 251 - 300: 5 units CBG 301 - 350: 7 units CBG 351 - 400: 9 units   Please include needles Patient not taking: Reported on 02/12/2015 01/28/15   Janett Billow U Vann, DO   BP 145/52 mmHg  Pulse 73  Temp(Src) 97.7 F (36.5 C) (Oral)  Resp 17  SpO2 97% Physical Exam  Constitutional: She is oriented to person, place, and time. She appears well-developed and well-nourished.  HENT:  Head: Normocephalic and atraumatic.  Right Ear: External ear normal.  Left Ear: External ear  normal.  Eyes: Conjunctivae and EOM are normal. Pupils are equal, round, and reactive to light.  Neck: Normal range of motion. Neck supple.  Cardiovascular: Normal rate, normal heart sounds and intact distal pulses.  An irregularly irregular rhythm present.  Pulmonary/Chest: Effort normal and breath sounds normal.  Abdominal: Soft. Bowel sounds are normal. There is no tenderness.  Musculoskeletal: Normal range of motion.  Neurological: She is alert and oriented to person, place, and time.  Skin: Skin is warm and dry.  Vitals reviewed.   ED Course  Procedures (including critical care time) Labs Review Labs Reviewed  CBC - Abnormal; Notable for the following:    MCH 25.9 (*)    All other components within normal limits  BASIC METABOLIC PANEL - Abnormal; Notable for the following:    Glucose, Bld 206 (*)    BUN 30 (*)    Creatinine, Ser 1.61 (*)    GFR calc non Af Amer 28 (*)    GFR calc Af Amer 33 (*)    All other components within normal limits  TSH - Abnormal; Notable for the following:    TSH 0.018 (*)    All other components within normal limits  T4, FREE - Abnormal; Notable for the following:    Free T4 2.27 (*)    All other components within normal limits  I-STAT TROPOININ, ED    Imaging Review No results found.   EKG Interpretation   Date/Time:  Thursday February 12 2015 12:01:40 EDT Ventricular Rate:  88 PR Interval:    QRS Duration: 168 QT Interval:  438 QTC Calculation: 530 R Axis:   -28 Text Interpretation:  Atrial fibrillation, new since prior tracing Left  bundle branch block Confirmed by Debby Freiberg [phone]) on 02/12/2015  1:12:25 PM      MDM   Final diagnoses:  Paroxysmal a-fib    81 y.o. female with pertinent PMH of paroxysmal afib, CHF, dementia presents with asymptomatic irregular heart rate from home. On arrival vital signs physical exam as above.  EKG with afib.  Wu unremarkable, TSH and T4 not available during visit.  She was taken off  amiodarone due to hyperthyroidism.  I spoke with cardiology to arrange fu and discuss if pt needed to be restarted on amiodarone, however they recommended correction of hyperthyroidism prior to rhythm control.  DC home in stable condition.  I have reviewed all laboratory and imaging studies if ordered as above  1. Paroxysmal a-fib         Debby Freiberg, MD 02/13/15 [phone]

## 2015-02-12 NOTE — ED Notes (Signed)
Family at bedside. 

## 2015-02-12 NOTE — Discharge Instructions (Signed)

## 2015-02-16 ENCOUNTER — Encounter: Payer: Self-pay | Admitting: Internal Medicine

## 2015-02-16 ENCOUNTER — Ambulatory Visit (INDEPENDENT_AMBULATORY_CARE_PROVIDER_SITE_OTHER): Payer: Medicare HMO | Admitting: Internal Medicine

## 2015-02-16 VITALS — BP 132/68 | HR 74 | Ht 65.0 in | Wt 219.2 lb

## 2015-02-16 DIAGNOSIS — I5032 Chronic diastolic (congestive) heart failure: Secondary | ICD-10-CM | POA: Diagnosis not present

## 2015-02-16 DIAGNOSIS — I10 Essential (primary) hypertension: Secondary | ICD-10-CM

## 2015-02-16 DIAGNOSIS — I481 Persistent atrial fibrillation: Secondary | ICD-10-CM

## 2015-02-16 DIAGNOSIS — I4819 Other persistent atrial fibrillation: Secondary | ICD-10-CM

## 2015-02-16 DIAGNOSIS — I5041 Acute combined systolic (congestive) and diastolic (congestive) heart failure: Secondary | ICD-10-CM | POA: Insufficient documentation

## 2015-02-16 NOTE — Patient Instructions (Signed)
Medication Instructions:  Your physician recommends that you continue on your current medications as directed. Please refer to the Current Medication list given to you today.   Labwork: None ordered  Testing/Procedures: None ordered  Follow-Up: Your physician recommends that you schedule a follow-up appointment in: 3 months with Roderic Palau, NP   Any Other Special Instructions Will Be Listed Below (If Applicable).

## 2015-02-16 NOTE — Progress Notes (Signed)
Electrophysiology Office Note   Date:  02/16/2015   ID:  Anna [name], DOB [birth year], MRN [id number]  PCP:   Dr Osborne Casco  Chief Complaint  Patient presents with  . Atrial Fibrillation     History of Present Illness: Anna [name] is a 81 y.o. female who presents today for electrophysiology evaluation.   She recently moved to [place] from [place] to be near her daughter.  She has dementia and requires significant care.  She has a h/o persistent afib for which she was previously managed with coumadin and amiodarone.  She developed hyperthyroidism and her amiodarone was discontinued 3 weeks ago.  She had difficulty with supratherapeutic INR due to confusions with her previous anticoagulation clinic in [place] and had a GI bleed in 2015.  She has been off of coumadin and has been on only ASA since that time.  She has had prior bradycardia and thus her beta blocker has been weaned over time. She has has some difficulty with SOB as well as BLE edema.  Echo 01/28/15 revealed EF 55%, mild LVH, LA size 41 mm, no significant arrhythmias.  She has now been placed on toprol XL 50mg  daily and is doing "very well" per pts daughter. Palpitations are controlled.  Today, she denies symptoms of chest pain, orthopnea, PND, lower extremity edema, claudication, dizziness, presyncope, syncope, bleeding, or neurologic sequela.  She has poor appetite. The patient is tolerating medications without difficulties and is otherwise without complaint today.    Past Medical History  Diagnosis Date  . CHF (congestive heart failure)   . Persistent atrial fibrillation   . Chronic renal insufficiency, stage III (moderate)   . Diabetes mellitus without complication   . Hypertension   . Dementia   . LBBB (left bundle branch block)   . Overactive bladder   . GI bleeding 11/2013    due to supratherapeutic INR  . Hyperlipidemia   . Vitamin D deficiency   . Vitamin B12 deficiency   . Hyperthyroidism     on amiodarone   . Overweight    Past Surgical History  Procedure Laterality Date  . Abdominal hysterectomy       Current Outpatient Prescriptions  Medication Sig Dispense Refill  . amLODipine (NORVASC) 5 MG tablet Take 1 tablet by mouth daily.    Marland Kitchen aspirin 325 MG tablet Take 325 mg by mouth daily.    . calcitRIOL (ROCALTROL) 0.5 MCG capsule Take 0.5 mcg by mouth 3 (three) times a week. Monday, Wednesday, and Friday only    . docusate sodium (COLACE) 100 MG capsule Take 100 mg by mouth daily as needed for mild constipation.    Marland Kitchen donepezil (ARICEPT) 10 MG tablet Take 10 mg by mouth at bedtime.    . furosemide (LASIX) 40 MG tablet Take 40 mg by mouth daily.     Marland Kitchen glimepiride (AMARYL) 4 MG tablet Take 4 mg by mouth daily with breakfast.    . guaiFENesin (MUCINEX) 600 MG 12 hr tablet Take 600 mg by mouth 2 (two) times daily as needed for cough.     . metoprolol succinate (TOPROL-XL) 25 MG 24 hr tablet Take 50 mg by mouth daily.     . mirabegron ER (MYRBETRIQ) 50 MG TB24 tablet Take 50 mg by mouth daily.    . potassium chloride SA (K-DUR,KLOR-CON) 20 MEQ tablet Take 20 mEq by mouth daily.    . pravastatin (PRAVACHOL) 40 MG tablet Take 40 mg by mouth daily.    Marland Kitchen  vitamin B-12 (CYANOCOBALAMIN) 500 MCG tablet Take 500 mcg by mouth daily.    . Vitamin D, Ergocalciferol, (DRISDOL) 50000 UNITS CAPS capsule Take 50,000 Units by mouth once a week. Mondays     No current facility-administered medications for this visit.    Allergies:   Pollen extract   Social History:  The patient  reports that she has never smoked. She does not have any smokeless tobacco history on file. She reports that she does not drink alcohol or use illicit drugs.   Family History:  The patient's  family history includes Diabetes in her mother; Heart disease in her father.    ROS:  Please see the history of present illness.   All other systems are reviewed and negative.    PHYSICAL EXAM: VS:  BP 132/68 mmHg  Pulse 74  Ht 5\' 5"   (1.651 m)  Wt 99.428 kg (219 lb 3.2 oz)  BMI 36.48 kg/m2 , BMI Body mass index is 36.48 kg/(m^2). GEN: overweight and elderly, in no acute distress, does not talk much but responds to questions, history is per her daughter who talks for her HEENT: normal Neck: no JVD, carotid bruits, or masses Cardiac: iRRR; no murmurs, rubs, or gallops,no edema  Respiratory:  clear to auscultation bilaterally, normal work of breathing GI: soft, nontender, nondistended, + BS MS: no deformity or atrophy Skin: warm and dry  Neuro:  Strength and sensation are intact Psych: euthymic mood, full affect  EKG:  EKG is ordered today. The ekg ordered today shows afib, 74 bpm, LBBB   Recent Labs: 01/24/2015: ALT 18 01/27/2015: B Natriuretic Peptide 26.4 02/12/2015: BUN 30*; Creatinine, Ser 1.61*; Hemoglobin 12.3; Platelets 177; Potassium 4.1; Sodium 141; TSH 0.018*    Lipid Panel  No results found for: CHOL, TRIG, HDL, CHOLHDL, VLDL, LDLCALC, LDLDIRECT   Wt Readings from Last 3 Encounters:  02/16/15 99.428 kg (219 lb 3.2 oz)  01/28/15 97.705 kg (215 lb 6.4 oz)  01/24/15 97.523 kg (215 lb)      Other studies Reviewed: Additional studies/ records that were reviewed today include: Dr Loren Racer notes are reviewed, echo is reviewed  Review of the above records today demonstrates: as above   ASSESSMENT AND PLAN:  1.  Persistent afib The patient has minimally symptomatic afib She appears reasonably rate controlled presently  This patients CHA2DS2-VASc Score and unadjusted Ischemic Stroke Rate (% per year) is equal to 7.2 % stroke rate/year from a score of 5  Today, I discussed Coumadin as well as novel anticoagulants including Pradaxa, Xarelto, Savaysa, and Eliquis today as indicated for risk reduction in stroke and systemic emboli with nonvalvular atrial fibrillation.  Risks, benefits, and alternatives to each of these drugs were discussed at length today. Unfortunately, she has had bleeding with coumadin  previously and does not feel that she can afford novel anticoagulation.  I have encouraged her to discuss this further with her insurance company given failure of coumadin.  She is aware that asa is not an appropriate alternative.  We also discussed Watchman (left atrial appendage occlusion) as an alternative today.  She may be willing to consider this once this option becomes available though I am not certain that with her age and dementia that she would be an appropriate candidate.  2. Chronic diastolic dysfunction euvolemic today 2 gram sodium restriction  3. HTN Stable No change required today  She will follow-up in the AF clinic every 3 months going forward and I will see as needed   Current  medicines are reviewed at length with the patient today.   The patient does not have concerns regarding her medicines.  The following changes were made today:  none   Signed, Thompson Grayer, MD  02/16/2015 9:29 AM     [facility] [address] [place] [address] [phone] (office) [phone] (fax)

## 2015-02-19 ENCOUNTER — Telehealth: Payer: Self-pay | Admitting: Internal Medicine

## 2015-02-19 NOTE — Telephone Encounter (Signed)
New Message  Pt daughter calling to speak w/ RN- pt's pulse was decreasing and pt wanted to see if she inc metoprolol. Please call back and discuss

## 2015-02-19 NOTE — Telephone Encounter (Signed)
Spoke with patient's daughter and advised to hold the Metoprolol today as her HR is 50.  She says it feels normal and was told by PCP not to take the Metoprolol if her HR is below 55.  She is going to continue to monitor her closely.  She will call back if she has any more concerns. Appreciated my call

## 2015-02-24 ENCOUNTER — Telehealth: Payer: Self-pay | Admitting: Internal Medicine

## 2015-02-24 NOTE — Telephone Encounter (Signed)
Pt's daughter called because since pt is in [place] which has been since she was D/c from the hospital; her heart rate has been 54 to 61 beats/minute. Daughter states that over the weekend pt's heart rate was 53-54 beats/minute, and pt did not take the Metoprolol. Pt's heart rate today is 54 beats/minute and pt feels dizzy. Daughter is holding her Metoprolol   12.5 mg once daily until her heart rate reaches 55 plus beats/minute. When pt takes the Metoprolol at 55 beats/minute her heart rate goes down to as low as 49 beats/minute that is when pt feels bad. Daughter would like to know when to give or to hold the metoprolol medication. Scott weaver [place] aware of pt's symptoms and recommends for pt to take Metoprolol 12.5 mg if heart rate is over 60 beats/minute. Attempted to let pt's daughter know.  Daughter's voicemail is full, unable to leave message.

## 2015-02-24 NOTE — Telephone Encounter (Signed)
New Message  Pt c/o BP issue:  1. What are your last 5 BP readings?  @ 10:45 133/71 pulse 54  No other readings  2. Are you having any other symptoms (ex. Dizziness, headache, blurred vision, passed out)? Dizziness blurred vision and headache  3. What is your medication issue? Pt daughter called to discuss beta blocker RX and to discuss the patients heart rate. What is the rate that she is aiming to stay in and if the pt doesn't reach that rate is it ok to skip the Beta Blocker.  Pt daughter reports the heart rate was staying at 53-54 over the weekend so she didn't give the patient a beta blocker. However during the evening around 10 pm on 02/22/2015 daughter states that she gave the beta blocker because it reached....?  Today the daughter reports the pt is getting dizzy and she is currently at 65. Daughter also states that she is holding the beta blocker until it reaches 53. But she asks  "what is the rate that her cardiologist wants her at and If she is given the beta blocker at 55 it will drop her heart rate" She believes that it may drop her to 49 and that is when the pt doesn't feel well. Daughter believes that a rate of 50-100 is a good heart rate but she would like a call back to confirm. Please call

## 2015-02-24 NOTE — Telephone Encounter (Signed)
I attempted to speak with pt's daughter x 3 today since 1:16 Pm to let her know about MD's recommendations. Unable to leave a message. Daughter [name]'s mailbox is full.

## 2015-02-24 NOTE — Telephone Encounter (Signed)
Spoke with pt's daughter [name]. She is aware of PA's recommendation to give Metoprolol 12.5 mg if pt's heart rate is more the 60 beats/minute. Daughter states she spoke with Claiborne Billings on 6/30, and she  recommended for pt to go back on the Metoprolol dose that she was taking before she got  into A-fib which it was 12.5 mg once a day.

## 2015-02-24 NOTE — Telephone Encounter (Signed)
Pt daughter returned call  

## 2015-02-26 DIAGNOSIS — E860 Dehydration: Secondary | ICD-10-CM | POA: Diagnosis not present

## 2015-02-26 DIAGNOSIS — R0600 Dyspnea, unspecified: Secondary | ICD-10-CM | POA: Diagnosis not present

## 2015-02-26 DIAGNOSIS — E559 Vitamin D deficiency, unspecified: Secondary | ICD-10-CM | POA: Diagnosis not present

## 2015-02-26 DIAGNOSIS — E538 Deficiency of other specified B group vitamins: Secondary | ICD-10-CM | POA: Diagnosis not present

## 2015-02-26 DIAGNOSIS — N179 Acute kidney failure, unspecified: Secondary | ICD-10-CM | POA: Diagnosis not present

## 2015-02-26 DIAGNOSIS — R5383 Other fatigue: Secondary | ICD-10-CM | POA: Diagnosis not present

## 2015-02-26 DIAGNOSIS — E0590 Thyrotoxicosis, unspecified without thyrotoxic crisis or storm: Secondary | ICD-10-CM | POA: Diagnosis not present

## 2015-02-26 DIAGNOSIS — K5900 Constipation, unspecified: Secondary | ICD-10-CM | POA: Diagnosis not present

## 2015-03-06 DIAGNOSIS — I480 Paroxysmal atrial fibrillation: Secondary | ICD-10-CM | POA: Diagnosis not present

## 2015-03-06 DIAGNOSIS — E1129 Type 2 diabetes mellitus with other diabetic kidney complication: Secondary | ICD-10-CM | POA: Diagnosis not present

## 2015-03-06 DIAGNOSIS — N183 Chronic kidney disease, stage 3 (moderate): Secondary | ICD-10-CM | POA: Diagnosis not present

## 2015-03-06 DIAGNOSIS — N390 Urinary tract infection, site not specified: Secondary | ICD-10-CM | POA: Diagnosis not present

## 2015-03-06 DIAGNOSIS — Z6835 Body mass index (BMI) 35.0-35.9, adult: Secondary | ICD-10-CM | POA: Diagnosis not present

## 2015-03-06 DIAGNOSIS — R8290 Unspecified abnormal findings in urine: Secondary | ICD-10-CM | POA: Diagnosis not present

## 2015-03-06 DIAGNOSIS — N179 Acute kidney failure, unspecified: Secondary | ICD-10-CM | POA: Diagnosis not present

## 2015-04-15 DIAGNOSIS — H2513 Age-related nuclear cataract, bilateral: Secondary | ICD-10-CM | POA: Diagnosis not present

## 2015-04-15 DIAGNOSIS — E119 Type 2 diabetes mellitus without complications: Secondary | ICD-10-CM | POA: Diagnosis not present

## 2015-04-15 DIAGNOSIS — Z0100 Encounter for examination of eyes and vision without abnormal findings: Secondary | ICD-10-CM | POA: Diagnosis not present

## 2015-04-15 DIAGNOSIS — H04123 Dry eye syndrome of bilateral lacrimal glands: Secondary | ICD-10-CM | POA: Diagnosis not present

## 2015-04-23 DIAGNOSIS — R300 Dysuria: Secondary | ICD-10-CM | POA: Diagnosis not present

## 2015-04-23 DIAGNOSIS — N390 Urinary tract infection, site not specified: Secondary | ICD-10-CM | POA: Diagnosis not present

## 2015-05-11 DIAGNOSIS — I130 Hypertensive heart and chronic kidney disease with heart failure and stage 1 through stage 4 chronic kidney disease, or unspecified chronic kidney disease: Secondary | ICD-10-CM | POA: Diagnosis not present

## 2015-05-11 DIAGNOSIS — I447 Left bundle-branch block, unspecified: Secondary | ICD-10-CM | POA: Diagnosis not present

## 2015-05-11 DIAGNOSIS — E1129 Type 2 diabetes mellitus with other diabetic kidney complication: Secondary | ICD-10-CM | POA: Diagnosis not present

## 2015-05-11 DIAGNOSIS — D692 Other nonthrombocytopenic purpura: Secondary | ICD-10-CM | POA: Diagnosis not present

## 2015-05-11 DIAGNOSIS — E0590 Thyrotoxicosis, unspecified without thyrotoxic crisis or storm: Secondary | ICD-10-CM | POA: Diagnosis not present

## 2015-05-11 DIAGNOSIS — E538 Deficiency of other specified B group vitamins: Secondary | ICD-10-CM | POA: Diagnosis not present

## 2015-05-11 DIAGNOSIS — N179 Acute kidney failure, unspecified: Secondary | ICD-10-CM | POA: Diagnosis not present

## 2015-05-11 DIAGNOSIS — R946 Abnormal results of thyroid function studies: Secondary | ICD-10-CM | POA: Diagnosis not present

## 2015-05-11 DIAGNOSIS — I129 Hypertensive chronic kidney disease with stage 1 through stage 4 chronic kidney disease, or unspecified chronic kidney disease: Secondary | ICD-10-CM | POA: Diagnosis not present

## 2015-05-19 ENCOUNTER — Encounter (HOSPITAL_COMMUNITY): Payer: Self-pay | Admitting: Nurse Practitioner

## 2015-05-19 ENCOUNTER — Ambulatory Visit (HOSPITAL_COMMUNITY)
Admission: RE | Admit: 2015-05-19 | Discharge: 2015-05-19 | Disposition: A | Discharge status: Home / Self Care | Payer: Medicare HMO | Source: Ambulatory Visit | Attending: Nurse Practitioner | Admitting: Nurse Practitioner

## 2015-05-19 VITALS — BP 144/62 | HR 55 | Ht 64.0 in | Wt 216.6 lb

## 2015-05-19 DIAGNOSIS — I480 Paroxysmal atrial fibrillation: Secondary | ICD-10-CM | POA: Insufficient documentation

## 2015-05-19 NOTE — Progress Notes (Signed)
Patient ID: Anna [name], female   DOB: 16-Nov-1928, 81 y.o.   MRN: [id number]      Primary Care Physician: Pcp Not In System Referring Physician: Dr. Sandria Manly Maree Ainley is a 81 y.o. female with a h/o PAF  To f/u in afib clinic.She recently moved to [place] from [place] to be near her daughter. She has dementia and requires significant care. She has a h/o persistent afib for which she was previously managed with coumadin and amiodarone. She developed hyperthyroidism and her amiodarone was discontinued 3 weeks ago. She had difficulty with supratherapeutic INR due to confusions with her previous anticoagulation clinic in [place] and had a GI bleed in 2015. She has been off of coumadin and has been on only ASA since that time. She has had prior bradycardia and thus her beta blocker has been weaned over time. She has has some difficulty with SOB as well as BLE edema. Echo 01/28/15 revealed EF 55%, mild LVH, LA size 41 mm, no significant arrhythmias. She has now been placed on toprol XL 12.5 mg daily and is doing "very well" per pts daughter. The daughter has not noticed  Any symptoms which would indicate any afib in the mother. She thinks she is asymptomatic when she does have afib.She does have occasional falls. The family wishes to keep her off anticoagulants.  Today, she denies symptoms of palpitations, chest pain, shortness of breath, orthopnea, PND, lower extremity edema, dizziness, presyncope, syncope, or neurologic sequela. The patient is tolerating medications without difficulties and is otherwise without complaint today.   Past Medical History  Diagnosis Date  . CHF (congestive heart failure)   . Persistent atrial fibrillation   . Chronic renal insufficiency, stage III (moderate)   . Diabetes mellitus without complication   . Hypertension   . Dementia   . LBBB (left bundle branch block)   . Overactive bladder   . GI bleeding 11/2013    due to supratherapeutic INR  .  Hyperlipidemia   . Vitamin D deficiency   . Vitamin B12 deficiency   . Hyperthyroidism     on amiodarone  . Overweight    Past Surgical History  Procedure Laterality Date  . Abdominal hysterectomy      Current Outpatient Prescriptions  Medication Sig Dispense Refill  . amLODipine (NORVASC) 5 MG tablet Take 1 tablet by mouth daily.    Marland Kitchen aspirin 325 MG tablet Take 325 mg by mouth daily.    . calcitRIOL (ROCALTROL) 0.5 MCG capsule Take 0.5 mcg by mouth 3 (three) times a week. Monday, Wednesday, and Friday only    . docusate sodium (COLACE) 100 MG capsule Take 100 mg by mouth daily as needed for mild constipation.    Marland Kitchen donepezil (ARICEPT) 10 MG tablet Take 10 mg by mouth at bedtime.    . furosemide (LASIX) 40 MG tablet Take 40 mg by mouth daily.     Marland Kitchen glimepiride (AMARYL) 4 MG tablet Take 4 mg by mouth daily with breakfast.    . guaiFENesin (MUCINEX) 600 MG 12 hr tablet Take 600 mg by mouth 2 (two) times daily as needed for cough.     . metoprolol succinate (TOPROL-XL) 25 MG 24 hr tablet Take 12.5 mg by mouth daily.     . mirabegron ER (MYRBETRIQ) 50 MG TB24 tablet Take 50 mg by mouth daily.    . potassium chloride SA (K-DUR,KLOR-CON) 20 MEQ tablet Take 20 mEq by mouth daily.    . pravastatin (PRAVACHOL)  40 MG tablet Take 40 mg by mouth daily.    . vitamin B-12 (CYANOCOBALAMIN) 500 MCG tablet Take 500 mcg by mouth daily.    . Vitamin D, Ergocalciferol, (DRISDOL) 50000 UNITS CAPS capsule Take 50,000 Units by mouth once a week. Mondays     No current facility-administered medications for this encounter.    Allergies  Allergen Reactions  . Pollen Extract     Sneezing, watery eyes    Social History   Social History  . Marital Status: Unknown    Spouse Name: N/A  . Number of Children: N/A  . Years of Education: N/A   Occupational History  . Not on file.   Social History Main Topics  . Smoking status: Never Smoker   . Smokeless tobacco: Not on file  . Alcohol Use: No  .  Drug Use: No  . Sexual Activity: Not on file   Other Topics Concern  . Not on file   Social History Narrative   Pt recently moved to [place] with daughter from [place]       Family History  Problem Relation Age of Onset  . Heart disease Father   . Diabetes Mother     ROS- All systems are reviewed and negative except as per the HPI above  Physical Exam: Filed Vitals:   05/19/15 0949  BP: 144/62  Pulse: 55  Height: 5\' 4"  (1.626 m)  Weight: 216 lb 9.6 oz (98.249 kg)    GEN- The patient is well appearing, alert and oriented x 3 today.   Head- normocephalic, atraumatic Eyes-  Sclera clear, conjunctiva pink Ears- hearing intact Oropharynx- clear Neck- supple, no JVP Lymph- no cervical lymphadenopathy Lungs- Clear to ausculation bilaterally, normal work of breathing Heart- Regular rate and rhythm, no murmurs, rubs or gallops, PMI not laterally displaced GI- soft, NT, ND, + BS Extremities- no clubbing, cyanosis, or edema MS- no significant deformity or atrophy Skin- no rash or lesion Psych- euthymic mood, full affect Neuro- strength and sensation are intact  EKG-SB with first degree AV block, NSIVB, Pr int 230 ms, QRS 148 ms, QTc 455 ms  Assessment and Plan: 1. PAF Currently in [place] Continue metoprolol at 12.5 mg daily, pt has tendency toward bradycardia Did discuss that an extra 12.5 mg can be taken if afib occurs with HR over 100. Continue asa per family's wishes, understanding that asa does not protect against stroke per guidelines.  F/u in three months in afib clinic.

## 2015-05-19 NOTE — Patient Instructions (Signed)
Parking code for December is [id number]

## 2015-06-24 DIAGNOSIS — R531 Weakness: Secondary | ICD-10-CM | POA: Diagnosis not present

## 2015-06-24 DIAGNOSIS — J209 Acute bronchitis, unspecified: Secondary | ICD-10-CM | POA: Diagnosis not present

## 2015-06-24 DIAGNOSIS — Z6834 Body mass index (BMI) 34.0-34.9, adult: Secondary | ICD-10-CM | POA: Diagnosis not present

## 2015-08-12 DIAGNOSIS — I130 Hypertensive heart and chronic kidney disease with heart failure and stage 1 through stage 4 chronic kidney disease, or unspecified chronic kidney disease: Secondary | ICD-10-CM | POA: Diagnosis not present

## 2015-08-12 DIAGNOSIS — N184 Chronic kidney disease, stage 4 (severe): Secondary | ICD-10-CM | POA: Diagnosis not present

## 2015-08-12 DIAGNOSIS — Z6834 Body mass index (BMI) 34.0-34.9, adult: Secondary | ICD-10-CM | POA: Diagnosis not present

## 2015-08-12 DIAGNOSIS — J309 Allergic rhinitis, unspecified: Secondary | ICD-10-CM | POA: Diagnosis not present

## 2015-08-12 DIAGNOSIS — R0609 Other forms of dyspnea: Secondary | ICD-10-CM | POA: Diagnosis not present

## 2015-08-12 DIAGNOSIS — R05 Cough: Secondary | ICD-10-CM | POA: Diagnosis not present

## 2015-08-12 DIAGNOSIS — R0600 Dyspnea, unspecified: Secondary | ICD-10-CM | POA: Diagnosis not present

## 2015-08-14 ENCOUNTER — Other Ambulatory Visit (HOSPITAL_COMMUNITY): Payer: Self-pay | Admitting: Internal Medicine

## 2015-08-14 ENCOUNTER — Ambulatory Visit (HOSPITAL_COMMUNITY)
Admission: RE | Admit: 2015-08-14 | Discharge: 2015-08-14 | Disposition: A | Discharge status: Home / Self Care | Payer: Commercial Managed Care - HMO | Source: Ambulatory Visit | Attending: Vascular Surgery | Admitting: Vascular Surgery

## 2015-08-14 DIAGNOSIS — R6889 Other general symptoms and signs: Secondary | ICD-10-CM | POA: Diagnosis not present

## 2015-08-14 DIAGNOSIS — N183 Chronic kidney disease, stage 3 (moderate): Secondary | ICD-10-CM | POA: Diagnosis not present

## 2015-08-14 DIAGNOSIS — E785 Hyperlipidemia, unspecified: Secondary | ICD-10-CM | POA: Insufficient documentation

## 2015-08-14 DIAGNOSIS — E119 Type 2 diabetes mellitus without complications: Secondary | ICD-10-CM | POA: Insufficient documentation

## 2015-08-14 DIAGNOSIS — Z0001 Encounter for general adult medical examination with abnormal findings: Secondary | ICD-10-CM

## 2015-08-14 DIAGNOSIS — I8393 Asymptomatic varicose veins of bilateral lower extremities: Secondary | ICD-10-CM | POA: Insufficient documentation

## 2015-08-14 DIAGNOSIS — I129 Hypertensive chronic kidney disease with stage 1 through stage 4 chronic kidney disease, or unspecified chronic kidney disease: Secondary | ICD-10-CM | POA: Diagnosis not present

## 2015-08-14 DIAGNOSIS — R791 Abnormal coagulation profile: Secondary | ICD-10-CM | POA: Insufficient documentation

## 2015-08-14 DIAGNOSIS — R0609 Other forms of dyspnea: Secondary | ICD-10-CM | POA: Diagnosis not present

## 2015-08-20 ENCOUNTER — Encounter (HOSPITAL_COMMUNITY): Payer: Self-pay | Admitting: Nurse Practitioner

## 2015-08-20 ENCOUNTER — Ambulatory Visit (HOSPITAL_COMMUNITY)
Admission: RE | Admit: 2015-08-20 | Discharge: 2015-08-20 | Disposition: A | Discharge status: Home / Self Care | Payer: Commercial Managed Care - HMO | Source: Ambulatory Visit | Attending: Nurse Practitioner | Admitting: Nurse Practitioner

## 2015-08-20 VITALS — BP 136/82 | HR 50 | Ht 65.0 in | Wt 211.2 lb

## 2015-08-20 DIAGNOSIS — R0602 Shortness of breath: Secondary | ICD-10-CM | POA: Insufficient documentation

## 2015-08-20 DIAGNOSIS — I480 Paroxysmal atrial fibrillation: Secondary | ICD-10-CM | POA: Diagnosis not present

## 2015-08-20 NOTE — Progress Notes (Signed)
Patient ID: Anna [name], female   DOB: [birth year], 81 y.o.   MRN: [id number]       Primary Care Physician: Anna Pao, MD Referring Physician: Dr. Sandria Manly Pearlia [name] is a 81 y.o. female with a h/o PAF.Marland KitchenShe recently moved to [place] from [place] to be near her daughter. She has dementia and requires significant care. She has a h/o persistent afib for which she was previously managed with coumadin and amiodarone. She developed hyperthyroidism and her amiodarone was discontinued. She had difficulty with supratherapeutic INR due to confusions with her previous anticoagulation clinic in [place] and had a GI bleed in 2015. She has been off of coumadin and has been on only ASA since that time. She has had prior bradycardia and thus her beta blocker has been weaned over time. She has has some difficulty with SOB as well as BLE edema. Echo 01/28/15 revealed EF 55%, mild LVH, LA size 41 mm, no significant arrhythmias. She has now been placed on toprol XL 12.5 mg daily and is doing "very well" per pts daughter. The daughter has not noticed  any symptoms which would indicate any afib in the mother. She thinks she is asymptomatic when she does have afib.She does have occasional falls. The family wishes to keep her off anticoagulants.  She is in [place] for f/u 12/29. The daughter has not noticed any afib, but she has had some difficulties with upper respiratory symptoms over the last month. She took two rounds of antibiotics with symptoms of cough, shortness of breath with rest and exertion not improving. The daughter noticed that her abdomen appeared larger so on her own increased her mother's doe of lasix to 60 mg a day from 40 mg daily. Within a few days, her symptoms improved. She then went back to 40 mg on Saturday, but by Tuesday the symptoms returned, so she increased the lasix again and today, she is improved. The pt has an appointment with her PCP tomorrow and wants to discuss if she  can stay on 60 mg a day. She is in S brady which is her usual. NO PND/orthopnea. No pedal edema.  Today, she denies symptoms of palpitations, chest pain, shortness of breath, orthopnea, PND, lower extremity edema, dizziness, presyncope, syncope, or neurologic sequela. The patient is tolerating medications without difficulties and is otherwise without complaint today.   Past Medical History  Diagnosis Date  . CHF (congestive heart failure) ([facility])   . Persistent atrial fibrillation ([facility])   . Chronic renal insufficiency, stage III (moderate)   . Diabetes mellitus without complication ([facility])   . Hypertension   . Dementia   . LBBB (left bundle branch block)   . Overactive bladder   . GI bleeding 11/2013    due to supratherapeutic INR  . Hyperlipidemia   . Vitamin D deficiency   . Vitamin B12 deficiency   . Hyperthyroidism     on amiodarone  . Overweight    Past Surgical History  Procedure Laterality Date  . Abdominal hysterectomy      Current Outpatient Prescriptions  Medication Sig Dispense Refill  . amLODipine (NORVASC) 5 MG tablet Take 1 tablet by mouth daily.    Marland Kitchen aspirin 325 MG tablet Take 325 mg by mouth daily.    . calcitRIOL (ROCALTROL) 0.5 MCG capsule Take 0.5 mcg by mouth 3 (three) times a week. Monday, Wednesday, and Friday only    . docusate sodium (COLACE) 100 MG capsule Take 100 mg by  mouth daily as needed for mild constipation.    Marland Kitchen donepezil (ARICEPT) 10 MG tablet Take 10 mg by mouth at bedtime.    . fluticasone (FLONASE) 50 MCG/ACT nasal spray Place 2 sprays into both nostrils daily.    . furosemide (LASIX) 40 MG tablet Take 40 mg by mouth daily.     Marland Kitchen glimepiride (AMARYL) 4 MG tablet Take 4 mg by mouth daily with breakfast.    . guaiFENesin (MUCINEX) 600 MG 12 hr tablet Take 600 mg by mouth 2 (two) times daily as needed for cough.     . metoprolol succinate (TOPROL-XL) 25 MG 24 hr tablet Take 12.5 mg by mouth daily.     . mirabegron ER (MYRBETRIQ) 50 MG TB24 tablet  Take 50 mg by mouth daily.    Marland Kitchen omeprazole (PRILOSEC) 40 MG capsule Take 40 mg by mouth daily.    . potassium chloride SA (K-DUR,KLOR-CON) 20 MEQ tablet Take 20 mEq by mouth daily.    . pravastatin (PRAVACHOL) 40 MG tablet Take 40 mg by mouth daily.    . vitamin B-12 (CYANOCOBALAMIN) 500 MCG tablet Take 500 mcg by mouth daily.    . Vitamin D, Ergocalciferol, (DRISDOL) 50000 UNITS CAPS capsule Take 50,000 Units by mouth once a week. Mondays     No current facility-administered medications for this encounter.    Allergies  Allergen Reactions  . Pollen Extract     Sneezing, watery eyes    Social History   Social History  . Marital Status: Unknown    Spouse Name: N/A  . Number of Children: N/A  . Years of Education: N/A   Occupational History  . Not on file.   Social History Main Topics  . Smoking status: Never Smoker   . Smokeless tobacco: Not on file  . Alcohol Use: No  . Drug Use: No  . Sexual Activity: Not on file   Other Topics Concern  . Not on file   Social History Narrative   Pt recently moved to [place] with daughter from [place]       Family History  Problem Relation Age of Onset  . Heart disease Father   . Diabetes Mother     ROS- All systems are reviewed and negative except as per the HPI above  Physical Exam: Filed Vitals:   08/20/15 1115  BP: 136/82  Pulse: 50  Height: 5\' 5"  (1.651 m)  Weight: 211 lb 3.2 oz (95.8 kg)    GEN- The patient is well appearing, alert and oriented x 3 today.   Head- normocephalic, atraumatic Eyes-  Sclera clear, conjunctiva pink Ears- hearing intact Oropharynx- clear Neck- supple, no JVP Lymph- no cervical lymphadenopathy Lungs- Clear to ausculation bilaterally, normal work of breathing Heart- Slow, regular rate and rhythm, no murmurs, rubs or gallops, PMI not laterally displaced GI- soft, NT, ND, + BS Extremities- no clubbing, cyanosis, or edema MS- no significant deformity or atrophy Skin- no rash or  lesion Psych- euthymic mood, full affect Neuro- strength and sensation are intact  EKG-SB with first degree AV block, LBBB, Pr int 246 ms, QRS 158 ms, QTc 465 ms  Assessment and Plan: 1. PAF Maintaining SR Continue metoprolol at 12.5 mg daily, pt has tendency toward bradycardia, tolerating well. Did discuss that an extra 12.5 mg can be taken if afib occurs with HR over 100. Continue asa per family's wishes, understanding that asa does not protect against stroke per guidelines, but GI bleed in the past on warfarin.  2. Shortness of breath Improved with increase of diuretic to 60 mg daily Daughter will discuss with PCP tomorrow if this can be her usual dose after renal function assessed  F/u in three months in afib clinic.  Geroge Baseman Anna [name], [facility] [address] [place], [place] [address] [phone]

## 2015-08-21 DIAGNOSIS — I10 Essential (primary) hypertension: Secondary | ICD-10-CM | POA: Diagnosis not present

## 2015-08-21 DIAGNOSIS — R531 Weakness: Secondary | ICD-10-CM | POA: Diagnosis not present

## 2015-08-21 DIAGNOSIS — R627 Adult failure to thrive: Secondary | ICD-10-CM | POA: Diagnosis not present

## 2015-08-21 DIAGNOSIS — E1129 Type 2 diabetes mellitus with other diabetic kidney complication: Secondary | ICD-10-CM | POA: Diagnosis not present

## 2015-08-21 DIAGNOSIS — R946 Abnormal results of thyroid function studies: Secondary | ICD-10-CM | POA: Diagnosis not present

## 2015-08-21 DIAGNOSIS — I129 Hypertensive chronic kidney disease with stage 1 through stage 4 chronic kidney disease, or unspecified chronic kidney disease: Secondary | ICD-10-CM | POA: Diagnosis not present

## 2015-08-21 DIAGNOSIS — I130 Hypertensive heart and chronic kidney disease with heart failure and stage 1 through stage 4 chronic kidney disease, or unspecified chronic kidney disease: Secondary | ICD-10-CM | POA: Diagnosis not present

## 2015-08-21 DIAGNOSIS — N184 Chronic kidney disease, stage 4 (severe): Secondary | ICD-10-CM | POA: Diagnosis not present

## 2015-08-26 ENCOUNTER — Inpatient Hospital Stay (HOSPITAL_COMMUNITY)
Admission: EM | Admit: 2015-08-26 | Discharge: 2015-09-01 | DRG: 689 | Disposition: A | Discharge status: Home / Self Care | Payer: Commercial Managed Care - HMO | Attending: Internal Medicine | Admitting: Internal Medicine

## 2015-08-26 ENCOUNTER — Emergency Department (HOSPITAL_COMMUNITY): Payer: Commercial Managed Care - HMO

## 2015-08-26 ENCOUNTER — Encounter (HOSPITAL_COMMUNITY): Payer: Self-pay | Admitting: Emergency Medicine

## 2015-08-26 DIAGNOSIS — N390 Urinary tract infection, site not specified: Principal | ICD-10-CM | POA: Diagnosis present

## 2015-08-26 DIAGNOSIS — E039 Hypothyroidism, unspecified: Secondary | ICD-10-CM | POA: Diagnosis present

## 2015-08-26 DIAGNOSIS — I5041 Acute combined systolic (congestive) and diastolic (congestive) heart failure: Secondary | ICD-10-CM | POA: Diagnosis not present

## 2015-08-26 DIAGNOSIS — E1129 Type 2 diabetes mellitus with other diabetic kidney complication: Secondary | ICD-10-CM | POA: Diagnosis present

## 2015-08-26 DIAGNOSIS — B9620 Unspecified Escherichia coli [E. coli] as the cause of diseases classified elsewhere: Secondary | ICD-10-CM | POA: Diagnosis present

## 2015-08-26 DIAGNOSIS — E1122 Type 2 diabetes mellitus with diabetic chronic kidney disease: Secondary | ICD-10-CM

## 2015-08-26 DIAGNOSIS — I447 Left bundle-branch block, unspecified: Secondary | ICD-10-CM | POA: Diagnosis present

## 2015-08-26 DIAGNOSIS — I130 Hypertensive heart and chronic kidney disease with heart failure and stage 1 through stage 4 chronic kidney disease, or unspecified chronic kidney disease: Secondary | ICD-10-CM | POA: Diagnosis present

## 2015-08-26 DIAGNOSIS — I10 Essential (primary) hypertension: Secondary | ICD-10-CM | POA: Diagnosis not present

## 2015-08-26 DIAGNOSIS — N183 Chronic kidney disease, stage 3 unspecified: Secondary | ICD-10-CM | POA: Diagnosis present

## 2015-08-26 DIAGNOSIS — F0390 Unspecified dementia without behavioral disturbance: Secondary | ICD-10-CM | POA: Diagnosis present

## 2015-08-26 DIAGNOSIS — I5043 Acute on chronic combined systolic (congestive) and diastolic (congestive) heart failure: Secondary | ICD-10-CM | POA: Diagnosis present

## 2015-08-26 DIAGNOSIS — R05 Cough: Secondary | ICD-10-CM | POA: Diagnosis not present

## 2015-08-26 DIAGNOSIS — Z7982 Long term (current) use of aspirin: Secondary | ICD-10-CM | POA: Diagnosis not present

## 2015-08-26 DIAGNOSIS — E785 Hyperlipidemia, unspecified: Secondary | ICD-10-CM | POA: Diagnosis present

## 2015-08-26 DIAGNOSIS — E0590 Thyrotoxicosis, unspecified without thyrotoxic crisis or storm: Secondary | ICD-10-CM | POA: Diagnosis present

## 2015-08-26 DIAGNOSIS — N3281 Overactive bladder: Secondary | ICD-10-CM | POA: Diagnosis present

## 2015-08-26 DIAGNOSIS — I481 Persistent atrial fibrillation: Secondary | ICD-10-CM | POA: Diagnosis present

## 2015-08-26 DIAGNOSIS — E669 Obesity, unspecified: Secondary | ICD-10-CM | POA: Diagnosis present

## 2015-08-26 DIAGNOSIS — B961 Klebsiella pneumoniae [K. pneumoniae] as the cause of diseases classified elsewhere: Secondary | ICD-10-CM | POA: Diagnosis present

## 2015-08-26 DIAGNOSIS — R531 Weakness: Secondary | ICD-10-CM | POA: Diagnosis not present

## 2015-08-26 DIAGNOSIS — R0602 Shortness of breath: Secondary | ICD-10-CM | POA: Diagnosis not present

## 2015-08-26 DIAGNOSIS — I429 Cardiomyopathy, unspecified: Secondary | ICD-10-CM | POA: Diagnosis present

## 2015-08-26 DIAGNOSIS — E876 Hypokalemia: Secondary | ICD-10-CM | POA: Diagnosis present

## 2015-08-26 DIAGNOSIS — R0600 Dyspnea, unspecified: Secondary | ICD-10-CM | POA: Diagnosis not present

## 2015-08-26 DIAGNOSIS — I5032 Chronic diastolic (congestive) heart failure: Secondary | ICD-10-CM | POA: Diagnosis not present

## 2015-08-26 LAB — URINALYSIS, ROUTINE W REFLEX MICROSCOPIC
BILIRUBIN URINE: NEGATIVE
Glucose, UA: NEGATIVE mg/dL
HGB URINE DIPSTICK: NEGATIVE
Ketones, ur: NEGATIVE mg/dL
NITRITE: POSITIVE — AB
PROTEIN: NEGATIVE mg/dL
SPECIFIC GRAVITY, URINE: 1.015 (ref 1.005–1.030)
pH: 5 (ref 5.0–8.0)

## 2015-08-26 LAB — BASIC METABOLIC PANEL
Anion gap: 9 (ref 5–15)
BUN: 23 mg/dL — ABNORMAL HIGH (ref 6–20)
CHLORIDE: 104 mmol/L (ref 101–111)
CO2: 27 mmol/L (ref 22–32)
CREATININE: 1.73 mg/dL — AB (ref 0.44–1.00)
Calcium: 9.2 mg/dL (ref 8.9–10.3)
GFR calc non Af Amer: 26 mL/min — ABNORMAL LOW (ref >60)
GFR, EST AFRICAN AMERICAN: 30 mL/min — AB (ref >60)
Glucose, Bld: 211 mg/dL — ABNORMAL HIGH (ref 65–99)
Potassium: 3.9 mmol/L (ref 3.5–5.1)
Sodium: 140 mmol/L (ref 135–145)

## 2015-08-26 LAB — URINE MICROSCOPIC-ADD ON
RBC / HPF: NONE SEEN RBC/hpf (ref 0–5)
SQUAMOUS EPITHELIAL / LPF: NONE SEEN

## 2015-08-26 LAB — GLUCOSE, CAPILLARY: GLUCOSE-CAPILLARY: 81 mg/dL (ref 65–99)

## 2015-08-26 LAB — CBC
HEMATOCRIT: 39.9 % (ref 36.0–46.0)
Hemoglobin: 12.3 g/dL (ref 12.0–15.0)
MCH: 27.6 pg (ref 26.0–34.0)
MCHC: 30.8 g/dL (ref 30.0–36.0)
MCV: 89.7 fL (ref 78.0–100.0)
PLATELETS: 177 10*3/uL (ref 150–400)
RBC: 4.45 MIL/uL (ref 3.87–5.11)
RDW: 13.8 % (ref 11.5–15.5)
WBC: 5.5 10*3/uL (ref 4.0–10.5)

## 2015-08-26 LAB — I-STAT TROPONIN, ED: Troponin i, poc: 0 ng/mL (ref 0.00–0.08)

## 2015-08-26 LAB — BRAIN NATRIURETIC PEPTIDE: B NATRIURETIC PEPTIDE 5: 60.7 pg/mL (ref 0.0–100.0)

## 2015-08-26 MED ORDER — GUAIFENESIN ER 600 MG PO TB12
600.0000 mg | ORAL_TABLET | Freq: Two times a day (BID) | ORAL | Status: DC
  Administered 2015-08-27 – 2015-08-31 (×11): 600 mg via ORAL
  Filled 2015-08-26 (×14): qty 1

## 2015-08-26 MED ORDER — LORATADINE 10 MG PO TABS
10.0000 mg | ORAL_TABLET | ORAL | Status: DC | PRN
  Filled 2015-08-26: qty 1

## 2015-08-26 MED ORDER — POLYVINYL ALCOHOL 1.4 % OP SOLN
1.0000 [drp] | Freq: Two times a day (BID) | OPHTHALMIC | Status: DC | PRN
  Filled 2015-08-26: qty 15

## 2015-08-26 MED ORDER — ASPIRIN 325 MG PO TABS
325.0000 mg | ORAL_TABLET | Freq: Every day | ORAL | Status: DC
  Administered 2015-08-27 – 2015-09-01 (×6): 325 mg via ORAL
  Filled 2015-08-26 (×7): qty 1

## 2015-08-26 MED ORDER — DONEPEZIL HCL 10 MG PO TABS
10.0000 mg | ORAL_TABLET | Freq: Every day | ORAL | Status: DC
  Administered 2015-08-27 – 2015-08-31 (×6): 10 mg via ORAL
  Filled 2015-08-26 (×6): qty 1

## 2015-08-26 MED ORDER — LEVALBUTEROL HCL 0.63 MG/3ML IN NEBU
0.6300 mg | INHALATION_SOLUTION | Freq: Four times a day (QID) | RESPIRATORY_TRACT | Status: DC | PRN
Start: 2015-08-26 — End: 2015-09-01

## 2015-08-26 MED ORDER — HEPARIN SODIUM (PORCINE) 5000 UNIT/ML IJ SOLN
5000.0000 [IU] | Freq: Three times a day (TID) | INTRAMUSCULAR | Status: DC
  Administered 2015-08-27 – 2015-09-01 (×15): 5000 [IU] via SUBCUTANEOUS
  Filled 2015-08-26 (×18): qty 1

## 2015-08-26 MED ORDER — DOCUSATE SODIUM 100 MG PO CAPS
100.0000 mg | ORAL_CAPSULE | Freq: Every day | ORAL | Status: DC | PRN
  Administered 2015-08-29: 100 mg via ORAL
  Filled 2015-08-26 (×2): qty 1

## 2015-08-26 MED ORDER — FUROSEMIDE 40 MG PO TABS
40.0000 mg | ORAL_TABLET | Freq: Every day | ORAL | Status: DC
  Administered 2015-08-27: 40 mg via ORAL
  Filled 2015-08-26: qty 1

## 2015-08-26 MED ORDER — DEXTROSE 5 % IV SOLN
1.0000 g | INTRAVENOUS | Status: DC
  Administered 2015-08-27 – 2015-08-31 (×5): 1 g via INTRAVENOUS
  Filled 2015-08-26 (×5): qty 10

## 2015-08-26 MED ORDER — PRAVASTATIN SODIUM 40 MG PO TABS
40.0000 mg | ORAL_TABLET | Freq: Every day | ORAL | Status: DC
  Administered 2015-08-27 – 2015-09-01 (×6): 40 mg via ORAL
  Filled 2015-08-26 (×6): qty 1

## 2015-08-26 MED ORDER — FUROSEMIDE 10 MG/ML IJ SOLN
40.0000 mg | Freq: Once | INTRAMUSCULAR | Status: DC
  Filled 2015-08-26: qty 4

## 2015-08-26 MED ORDER — CEFTRIAXONE SODIUM 1 G IJ SOLR
1.0000 g | INTRAMUSCULAR | Status: DC
Start: 2015-08-26 — End: 2015-08-26
  Filled 2015-08-26: qty 10

## 2015-08-26 MED ORDER — FLUTICASONE PROPIONATE 50 MCG/ACT NA SUSP
2.0000 | Freq: Every day | NASAL | Status: DC
  Administered 2015-08-27 – 2015-09-01 (×5): 2 via NASAL
  Filled 2015-08-26: qty 16

## 2015-08-26 MED ORDER — CALCITRIOL 0.5 MCG PO CAPS
0.5000 ug | ORAL_CAPSULE | ORAL | Status: DC
  Administered 2015-08-28 – 2015-08-31 (×2): 0.5 ug via ORAL
  Filled 2015-08-26 (×2): qty 1

## 2015-08-26 MED ORDER — FUROSEMIDE 10 MG/ML IJ SOLN
40.0000 mg | Freq: Once | INTRAMUSCULAR | Status: AC
  Administered 2015-08-26: 40 mg via INTRAVENOUS
  Filled 2015-08-26: qty 4

## 2015-08-26 MED ORDER — VITAMIN D (ERGOCALCIFEROL) 1.25 MG (50000 UNIT) PO CAPS
50000.0000 [IU] | ORAL_CAPSULE | ORAL | Status: DC
  Administered 2015-08-31: 50000 [IU] via ORAL
  Filled 2015-08-26 (×2): qty 1

## 2015-08-26 MED ORDER — SODIUM CHLORIDE 0.9 % IJ SOLN
3.0000 mL | Freq: Two times a day (BID) | INTRAMUSCULAR | Status: DC
Start: 2015-08-26 — End: 2015-09-01
  Administered 2015-08-27 – 2015-09-01 (×12): 3 mL via INTRAVENOUS

## 2015-08-26 MED ORDER — ONDANSETRON HCL 4 MG/2ML IJ SOLN: 4.0000 mg | Freq: Four times a day (QID) | INTRAMUSCULAR | Status: DC | PRN

## 2015-08-26 MED ORDER — POTASSIUM CHLORIDE CRYS ER 20 MEQ PO TBCR
20.0000 meq | EXTENDED_RELEASE_TABLET | Freq: Every day | ORAL | Status: DC
  Administered 2015-08-27 – 2015-09-01 (×6): 20 meq via ORAL
  Filled 2015-08-26 (×6): qty 1

## 2015-08-26 MED ORDER — METOPROLOL SUCCINATE ER 25 MG PO TB24
12.5000 mg | ORAL_TABLET | Freq: Every day | ORAL | Status: DC
  Administered 2015-08-27 – 2015-09-01 (×6): 12.5 mg via ORAL
  Filled 2015-08-26 (×6): qty 1

## 2015-08-26 MED ORDER — ONDANSETRON HCL 4 MG PO TABS: 4.0000 mg | ORAL_TABLET | Freq: Four times a day (QID) | ORAL | Status: DC | PRN

## 2015-08-26 MED ORDER — CYANOCOBALAMIN 500 MCG PO TABS
500.0000 ug | ORAL_TABLET | Freq: Every day | ORAL | Status: DC
  Administered 2015-08-27 – 2015-09-01 (×6): 500 ug via ORAL
  Filled 2015-08-26 (×6): qty 1

## 2015-08-26 MED ORDER — AMLODIPINE BESYLATE 5 MG PO TABS
5.0000 mg | ORAL_TABLET | Freq: Every day | ORAL | Status: DC
  Administered 2015-08-27: 5 mg via ORAL
  Filled 2015-08-26: qty 1

## 2015-08-26 MED ORDER — DEXTROSE 5 % IV SOLN
1.0000 g | Freq: Once | INTRAVENOUS | Status: AC
  Administered 2015-08-26: 1 g via INTRAVENOUS

## 2015-08-26 MED ORDER — INSULIN ASPART 100 UNIT/ML ~~LOC~~ SOLN
0.0000 [IU] | SUBCUTANEOUS | Status: DC
  Administered 2015-08-27 (×3): 1 [IU] via SUBCUTANEOUS
  Administered 2015-08-28: 2 [IU] via SUBCUTANEOUS

## 2015-08-26 MED ORDER — ACETAMINOPHEN 500 MG PO TABS: 500.0000 mg | ORAL_TABLET | Freq: Four times a day (QID) | ORAL | Status: DC | PRN

## 2015-08-26 NOTE — ED Notes (Signed)
Pt daughter says she's been sick with runny nose, low grade fever (99.9), nasal congestion, cough, with O2 desats to 88%. Daughter says the SOB comes most when she's laying down, hx of enlarged heart, but can breathe better sitting up. Daughter worried about possible pneumonia or CHF flare up. Lungs clear in triage.

## 2015-08-26 NOTE — H&P (Addendum)
[facility] History and Physical  Anna [name] [id number] DOB: 1929/07/12 DOA: 08/26/2015  Referring physician: ED PCP: Anna Pao, MD   Chief Complaint: Generalized weakness, shortness of breath, and nasal congestion  HPI:  Patient is a 82 year old female with a past medical history significant for CKD stage III, HTN, diabetes mellitus type 2, overactive bladder; who presents with multiple complaints including generalized weakness, shortness of breath, and nasal congestion.  Daughter is present at bedside and provides most of patient's history. Patient currently lives with her in her home and having just had moved from [place] 1 year ago. She notes that over the last 2 months that she's had continued symptoms of shortness of breath with any exertion. They have been back and forth to her primary care's office on multiple occasions forced patient had been given a Z-Pak and amoxicillin in the last month or Anna without relief of symptoms. Daughter notes that she is been intermittently noticing that her mother's belly swells although she does not have peripheral edema. She states that she is been intermittently increasing her Lasix dose from 40 mg daily to 60 mg daily with some improvement in patient's symptoms. She states just done this for a few days at a time intermittently over this last month. Last states doing this around Christmas time and states her mother was up and active without cough. They trying weight her at home but note that the skills work does not wide enough and her balance is not to. She has as the primary care physician to change medications around states that every time they go when they state everything looks fine. She was instructed not to give the patient extra Lasix and to avoid over use of antihistamines. Since taking the advice with regards to the extra Lasix dose, over the last few days her mother has progressively declined and become more weak. She had a  low-grade fever of 44F today along with runny nose and O2 sats on room air documented at 88%. Associated symptoms include hiccups and a dry/wet sounding cough. [name] does not complained of any dysuria, frequency, blood in urine, or chest pain. Daughter states that she's been on Mybetric for at least 2 years now but she notes that she takes her to the bathroom regularly and wonders if this is causing her shortness of breath symptoms or put her at risk for UTI. She would like to stop this medication possible as she notes that she is home regularly and takes her to the bathroom every 2 hours.  Upon admission into the emergency department initial lab work was relatively unremarkable BMP 60.7. Patient was evaluated with a chest x-ray which shows cardiomegaly with mild pulmonary congestion and mild bibasilar atelectasis. UA is positive for many bacteria, moderate LE, positive nitrite, and no squamous epithelial cells. She is given 40 mg of IV Lasix 1 dose, and then started on Rocephin for possible urinary tract infection.   Review of Systems  Constitutional: Positive for fever (Low-grade) and malaise/fatigue.  HENT: Positive for hearing loss.   Respiratory: Positive for cough and shortness of breath. Negative for hemoptysis and sputum production.   Cardiovascular: Positive for orthopnea. Negative for chest pain.  Gastrointestinal: Negative for nausea and blood in stool.       Abdominal swelling  Genitourinary: Negative for dysuria, frequency and hematuria.  Musculoskeletal: Positive for joint pain. Negative for falls.  Skin: Negative for itching and rash.  Neurological: Positive for weakness and headaches. Negative for  seizures and loss of consciousness.  Endo/Heme/Allergies: Positive for environmental allergies. Negative for polydipsia.  Psychiatric/Behavioral: Negative for substance abuse. The patient does not have insomnia.       Past Medical History  Diagnosis Date  . CHF (congestive  heart failure) ([place])   . Persistent atrial fibrillation ([place])   . Chronic renal insufficiency, stage III (moderate)   . Diabetes mellitus without complication ([place])   . Hypertension   . Dementia   . LBBB (left bundle branch block)   . Overactive bladder   . GI bleeding 11/2013    due to supratherapeutic INR  . Hyperlipidemia   . Vitamin D deficiency   . Vitamin B12 deficiency   . Hyperthyroidism     on amiodarone  . Overweight      Past Surgical History  Procedure Laterality Date  . Abdominal hysterectomy        Social History:  reports that she has never smoked. She does not have any smokeless tobacco history on file. She reports that she does not drink alcohol or use illicit drugs. Where does patient live--home and with whom if at home? Daughter and family Can patient participate in ADLs? needs assistance patient walks with a walker  Allergies  Allergen Reactions  . Pollen Extract     Sneezing, watery eyes    Family History  Problem Relation Age of Onset  . Heart disease Father   . Diabetes Mother        Prior to Admission medications   Medication Sig Start Date End Date Taking? Authorizing Provider  acetaminophen (TYLENOL) 500 MG tablet Take 500 mg by mouth every 6 (six) hours as needed for fever.    Yes Historical Provider, MD  amLODipine (NORVASC) 5 MG tablet Take 1 tablet by mouth daily. 01/26/15  Yes Historical Provider, MD  aspirin 325 MG tablet Take 325 mg by mouth daily.   Yes Historical Provider, MD  calcitRIOL (ROCALTROL) 0.5 MCG capsule Take 0.5 mcg by mouth 3 (three) times a week. Monday, Wednesday, and Friday only 01/07/15  Yes Historical Provider, MD  donepezil (ARICEPT) 10 MG tablet Take 10 mg by mouth at bedtime.   Yes Historical Provider, MD  fluticasone (FLONASE) 50 MCG/ACT nasal spray Place 2 sprays into both nostrils daily.   Yes Historical Provider, MD  furosemide (LASIX) 40 MG tablet Take 40 mg by mouth daily.  12/17/14  Yes Historical Provider,  MD  glimepiride (AMARYL) 4 MG tablet Take 4 mg by mouth daily with breakfast.   Yes Historical Provider, MD  guaiFENesin (MUCINEX) 600 MG 12 hr tablet Take 600 mg by mouth 2 (two) times daily as needed for cough.    Yes Historical Provider, MD  loratadine (ALAVERT) 10 MG tablet Take 10 mg by mouth every other day as needed for allergies.   Yes Historical Provider, MD  metoprolol succinate (TOPROL-XL) 25 MG 24 hr tablet Take 12.5 mg by mouth daily.    Yes Historical Provider, MD  mirabegron ER (MYRBETRIQ) 50 MG TB24 tablet Take 50 mg by mouth daily.   Yes Historical Provider, MD  Polyethyl Glycol-Propyl Glycol (SYSTANE) 0.4-0.3 % SOLN Apply 1 drop to eye 2 (two) times daily as needed (dry eyes).   Yes Historical Provider, MD  potassium chloride SA (K-DUR,KLOR-CON) 20 MEQ tablet Take 20 mEq by mouth daily.   Yes Historical Provider, MD  pravastatin (PRAVACHOL) 40 MG tablet Take 40 mg by mouth daily.   Yes Historical Provider, MD  vitamin B-12 (CYANOCOBALAMIN)  500 MCG tablet Take 500 mcg by mouth daily.   Yes Historical Provider, MD  docusate sodium (COLACE) 100 MG capsule Take 100 mg by mouth daily as needed for mild constipation.    Historical Provider, MD  Vitamin D, Ergocalciferol, (DRISDOL) 50000 UNITS CAPS capsule Take 50,000 Units by mouth once a week. Mondays 01/07/15   Historical Provider, MD     Physical Exam: Filed Vitals:   08/26/15 1309 08/26/15 1703 08/26/15 2057  BP: 123/62 148/53 123/91  Pulse: 56 66 81  Temp: 98 F (36.7 C) 98.4 F (36.9 C) 100.1 F (37.8 C)  TempSrc: Oral Oral Oral  Resp: 20 18 18   SpO2: 98% 96% 96%     Constitutional: Vital signs reviewed. Patient is a obese elderly female that appears ill, but nontoxic. Lethargic, but able to answer and follow commands Head: Normocephalic and atraumatic  Ear: TM normal bilaterally  Mouth: no erythema or exudates, MMM  Nose: Patient with congestion, but no sinus tenderness pale turbinates Eyes: PERRL, EOMI,  conjunctivae normal, No scleral icterus.  Neck: Supple, Trachea midline normal ROM, No JVD, mass, thyromegaly, or carotid bruit present.  Cardiovascular: RRR, S1 normal, S2 normal, no MRG, pulses symmetric and intact bilaterally  Pulmonary/Chest:  Decreased aeration bilaterally with bibasilar crackles appreciated. Abdominal: Soft. mildly distended with notable possible fluid wave. Nontender to palpation positive bowel sounds in all 4 quadrants GU: no CVA tenderness Musculoskeletal: No joint deformities, erythema, or stiffness, ROM full and no nontender Ext: no  significant peripheraledema and no cyanosis, pulses palpable bilaterally (DP and PT)  Hematology: no cervical, inginal, or axillary adenopathy.  Neurological: A&O x3, strength is decreased overall and symmetric Skin: Warm, dry and intact. No rash, cyanosis, or clubbing.  Psychiatric: Normal mood and affect. speech and behavior is normal. Judgment and thought content normal. Cognition and memory are normal.      Data Review   Micro Results No results found for this or any previous visit (from the past 240 hour(s)).  Radiology Reports Dg Chest 2 View  08/26/2015  CLINICAL DATA:  Increasing shortness of breath and cough for 3 days. Coughing congestion for 2 months. History of CHF and atrial fibrillation. EXAM: CHEST  2 VIEW COMPARISON:  01/27/2015 FINDINGS: Cardiac silhouette remains enlarged. Left hemidiaphragm remains mildly elevated with left basilar subsegmental atelectasis. Minimal right basilar atelectasis is also noted. There is mild pulmonary vascular congestion without evidence of overt edema. No segmental airspace consolidation, pleural effusion, or pneumothorax is identified. No acute osseous abnormality is identified. Aortic calcification is noted. IMPRESSION: Cardiomegaly with mild pulmonary vascular congestion and mild bibasilar atelectasis. Electronically Signed   By: Logan Bores M.D.   On: 08/26/2015 14:11      CBC  Recent Labs Lab 08/26/15 1403  WBC 5.5  HGB 12.3  HCT 39.9  PLT 177  MCV 89.7  MCH 27.6  MCHC 30.8  RDW 13.8    Chemistries   Recent Labs Lab 08/26/15 1403  NA 140  K 3.9  CL 104  CO2 27  GLUCOSE 211*  BUN 23*  CREATININE 1.73*  CALCIUM 9.2   ------------------------------------------------------------------------------------------------------------------ estimated creatinine clearance is 27.7 mL/min (by C-G formula based on Cr of 1.73). ------------------------------------------------------------------------------------------------------------------ No results for input(s): HGBA1C in the last 72 hours. ------------------------------------------------------------------------------------------------------------------ No results for input(s): CHOL, HDL, LDLCALC, TRIG, CHOLHDL, LDLDIRECT in the last 72 hours. ------------------------------------------------------------------------------------------------------------------ No results for input(s): TSH, T4TOTAL, T3FREE, THYROIDAB in the last 72 hours.  Invalid input(s): FREET3 ------------------------------------------------------------------------------------------------------------------ No results for input(s):  VITAMINB12, FOLATE, FERRITIN, TIBC, IRON, RETICCTPCT in the last 72 hours.  Coagulation profile No results for input(s): INR, PROTIME in the last 168 hours.  No results for input(s): DDIMER in the last 72 hours.  Cardiac Enzymes No results for input(s): CKMB, TROPONINI, MYOGLOBIN in the last 168 hours.  Invalid input(s): CK ------------------------------------------------------------------------------------------------------------------ Invalid input(s): POCBNP   CBG: No results for input(s): GLUCAP in the last 168 hours.     EKG: Independently reviewed. junctional rhythm with a possible conduction delay but appears similar to the last EKG   Assessment/Plan Principal Problem:    UTI  (lower urinary tract infection): Acute. Patient did not complain of any urinary symptoms but UA positive for many bacteria, moderate leukocyte esterase, nitrites, and no squamous epithelial cells seen. This could be additional factor  for her generalized weakness  - IV Rocephin  - Follow-up urine culture   Upper respiratory infection: Patient reporting headache, low-grade fever, and cough. Question possibility of viral versus bacterial versus allergies. - Checking influenza - continue mucinex - Tylenol when necessary fever or headache  Chronic diastolic heart failure ([place]): Last echo EF 55% in 01/2015, with mild LVH and normal systolic function. Initial Bnp elevated and 60.7,  but note patient is obese. Chest x-ray shows mild pulmonary vascular congestion. Patient was given IV 40 mg Lasix 1 dose and ED. - Admitting to telemetry bed   - Continued Lasix 40 mg per home dose for now. Likely will need to adjust home Lasix dose alternating between 40 and 60 mg every other day  - Check daily weights with strict ins and outs - echocardiogram completed in a.m. - Care management to assess for home needs which also include need of a scale the patient can stand on   Generalized weakness: - Physical therapy to eval and treat  Dyspnea: Subacute to chronic. Symptoms have at least been going on for the last 2 months waxing and waning - Continuous pulse oximetry with nasal cannula oxygen as needed to keep O2 sats greater than 92%  - 6 minute walk with pulse oximetry  - DuoNeb's as needed for shortness of breath - Incentive spirometry   DM (diabetes mellitus) type II controlled with renal manifestation ([place]) - Held patient's oral hypoglycemic agent glimperide -  every 4 hour CBGs with sensitive sliding scale of insulin changed to before meals in a.m.   CKD (chronic kidney disease), stage III: Stable. Patient's creatinine is somewhere around 1.6, and mildly elevated at 1.73 on admission - Continue to  monitor with increased Lasix  - Stopped Mybretriq. Patient was only supposed be on 25 mg max due to chronic kidney disease. This likely put her at more risk for UTI    Hyperthyroidism: Last checked in 01/2015 free T4 elevated at 2.27. Question if this is playing a factor in patient's symptoms  -Check TSH and free T4    Benign essential HTN - Continue current home regimen   Persistent atrial fibrillation. Chronic issue for which the patient is a high  fall risks. Currently rate controlled chadsvasc 4 - continue aspirin  Dementia - continue Aricept  Code Status:   full Family Communication: bedside Disposition Plan: admit   Total time spent 55 minutes.Greater than 50% of this time was spent in counseling, explanation of diagnosis, planning of further management, and coordination of care  [facility] Pager [phone]  If 7PM-7AM, please contact night-coverage [email or web address] Password [email or web address] 08/26/2015, 9:20 PM

## 2015-08-26 NOTE — ED Provider Notes (Signed)
CSN: [id number]     Arrival date & time 08/26/15  1253 History   First MD Initiated Contact with Patient 08/26/15 1628     Chief Complaint  Patient presents with  . Weakness  . Shortness of Breath  . Nasal Congestion     (Consider location/radiation/quality/duration/timing/severity/associated sxs/prior Treatment) HPI Comments: Pt here with sob and increased weakness and low grade fever (99.5) for 2 days--been tx with tylenol  Seen by her pcp on Friday for similar sx and had neg w/u No emesis, diarrhea No chest pain but has doe and othropnea--pt had O2 sats to 88s at home--doesn't use home O2 No headache, rashes, photophobia Denies urinary sx  Patient is a 82 y.o. female presenting with weakness and shortness of breath. The history is provided by a relative and the patient.  Weakness Associated symptoms include shortness of breath.  Shortness of Breath   Past Medical History  Diagnosis Date  . CHF (congestive heart failure) ([place])   . Persistent atrial fibrillation ([place])   . Chronic renal insufficiency, stage III (moderate)   . Diabetes mellitus without complication ([place])   . Hypertension   . Dementia   . LBBB (left bundle branch block)   . Overactive bladder   . GI bleeding 11/2013    due to supratherapeutic INR  . Hyperlipidemia   . Vitamin D deficiency   . Vitamin B12 deficiency   . Hyperthyroidism     on amiodarone  . Overweight    Past Surgical History  Procedure Laterality Date  . Abdominal hysterectomy     Family History  Problem Relation Age of Onset  . Heart disease Father   . Diabetes Mother    Social History  Substance Use Topics  . Smoking status: Never Smoker   . Smokeless tobacco: None  . Alcohol Use: No   OB History    No data available     Review of Systems  Respiratory: Positive for shortness of breath.   Neurological: Positive for weakness.  All other systems reviewed and are negative.     Allergies  Pollen extract  Home  Medications   Prior to Admission medications   Medication Sig Start Date End Date Taking? Authorizing Provider  acetaminophen (TYLENOL) 500 MG tablet Take 500 mg by mouth every 6 (six) hours as needed for fever.    Yes Historical Provider, MD  amLODipine (NORVASC) 5 MG tablet Take 1 tablet by mouth daily. 01/26/15  Yes Historical Provider, MD  aspirin 325 MG tablet Take 325 mg by mouth daily.   Yes Historical Provider, MD  calcitRIOL (ROCALTROL) 0.5 MCG capsule Take 0.5 mcg by mouth 3 (three) times a week. Monday, Wednesday, and Friday only 01/07/15  Yes Historical Provider, MD  donepezil (ARICEPT) 10 MG tablet Take 10 mg by mouth at bedtime.   Yes Historical Provider, MD  fluticasone (FLONASE) 50 MCG/ACT nasal spray Place 2 sprays into both nostrils daily.   Yes Historical Provider, MD  furosemide (LASIX) 40 MG tablet Take 40 mg by mouth daily.  12/17/14  Yes Historical Provider, MD  glimepiride (AMARYL) 4 MG tablet Take 4 mg by mouth daily with breakfast.   Yes Historical Provider, MD  guaiFENesin (MUCINEX) 600 MG 12 hr tablet Take 600 mg by mouth 2 (two) times daily as needed for cough.    Yes Historical Provider, MD  loratadine (ALAVERT) 10 MG tablet Take 10 mg by mouth every other day as needed for allergies.   Yes Historical Provider, MD  metoprolol succinate (TOPROL-XL) 25 MG 24 hr tablet Take 12.5 mg by mouth daily.    Yes Historical Provider, MD  mirabegron ER (MYRBETRIQ) 50 MG TB24 tablet Take 50 mg by mouth daily.   Yes Historical Provider, MD  Polyethyl Glycol-Propyl Glycol (SYSTANE) 0.4-0.3 % SOLN Apply 1 drop to eye 2 (two) times daily as needed (dry eyes).   Yes Historical Provider, MD  potassium chloride SA (K-DUR,KLOR-CON) 20 MEQ tablet Take 20 mEq by mouth daily.   Yes Historical Provider, MD  pravastatin (PRAVACHOL) 40 MG tablet Take 40 mg by mouth daily.   Yes Historical Provider, MD  vitamin B-12 (CYANOCOBALAMIN) 500 MCG tablet Take 500 mcg by mouth daily.   Yes Historical  Provider, MD  docusate sodium (COLACE) 100 MG capsule Take 100 mg by mouth daily as needed for mild constipation.    Historical Provider, MD  Vitamin D, Ergocalciferol, (DRISDOL) 50000 UNITS CAPS capsule Take 50,000 Units by mouth once a week. Mondays 01/07/15   Historical Provider, MD   BP 123/62 mmHg  Pulse 56  Temp(Src) 98 F (36.7 C) (Oral)  Resp 20  SpO2 98% Physical Exam  Constitutional: She is oriented to person, place, and time. She appears well-developed and well-nourished.  Non-toxic appearance. No distress.  HENT:  Head: Normocephalic and atraumatic.  Eyes: Conjunctivae, EOM and lids are normal. Pupils are equal, round, and reactive to light.  Neck: Normal range of motion. Neck supple. No tracheal deviation present. No thyroid mass present.  Cardiovascular: Normal rate, regular rhythm and normal heart sounds.  Exam reveals no gallop.   No murmur heard. Pulmonary/Chest: Effort normal. No stridor. No respiratory distress. She has decreased breath sounds. She has no wheezes. She has rales.  Abdominal: Soft. Normal appearance and bowel sounds are normal. She exhibits no distension. There is no tenderness. There is no rebound and no CVA tenderness.  Musculoskeletal: Normal range of motion. She exhibits no edema or tenderness.  Neurological: She is alert and oriented to person, place, and time. She has normal strength. No cranial nerve deficit or sensory deficit. GCS eye subscore is 4. GCS verbal subscore is 5. GCS motor subscore is 6.  Skin: Skin is warm and dry. No abrasion and no rash noted.  Psychiatric: She has a normal mood and affect. Her speech is normal and behavior is normal.  Nursing note and vitals reviewed.   ED Course  Procedures (including critical care time) Labs Review Labs Reviewed  BASIC METABOLIC PANEL - Abnormal; Notable for the following:    Glucose, Bld 211 (*)    BUN 23 (*)    Creatinine, Ser 1.73 (*)    GFR calc non Af Amer 26 (*)    GFR calc Af Amer  30 (*)    All other components within normal limits  URINE CULTURE  CBC  URINALYSIS, ROUTINE W REFLEX MICROSCOPIC (NOT AT [facility])  BRAIN NATRIURETIC PEPTIDE  I-STAT TROPOININ, ED    Imaging Review Dg Chest 2 View  08/26/2015  CLINICAL DATA:  Increasing shortness of breath and cough for 3 days. Coughing congestion for 2 months. History of CHF and atrial fibrillation. EXAM: CHEST  2 VIEW COMPARISON:  01/27/2015 FINDINGS: Cardiac silhouette remains enlarged. Left hemidiaphragm remains mildly elevated with left basilar subsegmental atelectasis. Minimal right basilar atelectasis is also noted. There is mild pulmonary vascular congestion without evidence of overt edema. No segmental airspace consolidation, pleural effusion, or pneumothorax is identified. No acute osseous abnormality is identified. Aortic calcification is noted. IMPRESSION:  Cardiomegaly with mild pulmonary vascular congestion and mild bibasilar atelectasis. Electronically Signed   By: Logan Bores M.D.   On: 08/26/2015 14:11   I have personally reviewed and evaluated these images and lab results as part of my medical decision-making.   EKG Interpretation   Date/Time:  Wednesday August 26 2015 13:19:55 EST Ventricular Rate:  52 PR Interval:    QRS Duration: 161 QT Interval:  478 QTC Calculation: 444 R Axis:   43 Text Interpretation:  Junctional rhythm Nonspecific intraventricular  conduction delay Consider anterior infarct Minimal ST depression, diffuse  leads No significant change since last tracing Confirmed by Corda Shutt  MD,  Lakiya Cottam ([id number]) on 08/26/2015 5:05:43 PM      MDM   Final diagnoses:  None   Patient with evidence of UTI here started on Rocephin IV. Clinically she has CHF will be given IV dose of Lasix. We'll admit to the medicine service     Lacretia Leigh, MD 08/26/15 2010

## 2015-08-26 NOTE — ED Notes (Signed)
ATTEMPTED TO COLLECT LAB AND WAS UNSUCCESSFUL.  I MADE THE NURSE AWARE.

## 2015-08-26 NOTE — Progress Notes (Signed)
ANTIBIOTIC CONSULT NOTE - INITIAL  Pharmacy Consult for Ceftriaxone Indication: UTI  Allergies  Allergen Reactions  . Pollen Extract     Sneezing, watery eyes    Patient Measurements:   Adjusted Body Weight:   Vital Signs: Temp: 100.1 F (37.8 C) (01/04 2057) Temp Source: Oral (01/04 2057) BP: 123/91 mmHg (01/04 2057) Pulse Rate: 81 (01/04 2057) Intake/Output from previous day:   Intake/Output from this shift:    Labs:  Recent Labs  08/26/15 1403  WBC 5.5  HGB 12.3  PLT 177  CREATININE 1.73*   Estimated Creatinine Clearance: 27.7 mL/min (by C-G formula based on Cr of 1.73). No results for input(s): VANCOTROUGH, VANCOPEAK, VANCORANDOM, GENTTROUGH, GENTPEAK, GENTRANDOM, TOBRATROUGH, TOBRAPEAK, TOBRARND, AMIKACINPEAK, AMIKACINTROU, AMIKACIN in the last 72 hours.   Microbiology: No results found for this or any previous visit (from the past 720 hour(s)).  Medical History: Past Medical History  Diagnosis Date  . CHF (congestive heart failure) ([place])   . Persistent atrial fibrillation ([place])   . Chronic renal insufficiency, stage III (moderate)   . Diabetes mellitus without complication ([place])   . Hypertension   . Dementia   . LBBB (left bundle branch block)   . Overactive bladder   . GI bleeding 11/2013    due to supratherapeutic INR  . Hyperlipidemia   . Vitamin D deficiency   . Vitamin B12 deficiency   . Hyperthyroidism     on amiodarone  . Overweight    Assessment: 8 yoF with PMHX significant for CKD-III, HTN, DMT2, Afib and CHF presents with weakness, SOB, and nasal congestion.  U/A positive for bacteria (no squamous epithelial cells), nitrites, leukocytes.  Pharmacy consulted to start Ceftriaxone for UTI.  Urine culture ordered. No antibiotic allergies noted.   Goal of Therapy:  Eradication of infection  Plan:  Ceftriaxone 1g IV q24h  Need for further dosage adjustment appears unlikely at present.   Will sign off at this time.  Please reconsult if  a change in clinical status warrants re-evaluation of dosage.   Ralene Bathe, PharmD, BCPS 08/26/2015, 10:01 PM  Pager: [phone]

## 2015-08-27 ENCOUNTER — Observation Stay (HOSPITAL_BASED_OUTPATIENT_CLINIC_OR_DEPARTMENT_OTHER): Payer: Commercial Managed Care - HMO

## 2015-08-27 DIAGNOSIS — I5043 Acute on chronic combined systolic (congestive) and diastolic (congestive) heart failure: Secondary | ICD-10-CM

## 2015-08-27 DIAGNOSIS — R0600 Dyspnea, unspecified: Secondary | ICD-10-CM | POA: Diagnosis not present

## 2015-08-27 DIAGNOSIS — E0590 Thyrotoxicosis, unspecified without thyrotoxic crisis or storm: Secondary | ICD-10-CM

## 2015-08-27 DIAGNOSIS — R531 Weakness: Secondary | ICD-10-CM

## 2015-08-27 DIAGNOSIS — N390 Urinary tract infection, site not specified: Secondary | ICD-10-CM | POA: Diagnosis not present

## 2015-08-27 DIAGNOSIS — N183 Chronic kidney disease, stage 3 (moderate): Secondary | ICD-10-CM | POA: Diagnosis not present

## 2015-08-27 DIAGNOSIS — I5041 Acute combined systolic (congestive) and diastolic (congestive) heart failure: Secondary | ICD-10-CM | POA: Diagnosis not present

## 2015-08-27 DIAGNOSIS — I5032 Chronic diastolic (congestive) heart failure: Secondary | ICD-10-CM | POA: Diagnosis not present

## 2015-08-27 DIAGNOSIS — I10 Essential (primary) hypertension: Secondary | ICD-10-CM | POA: Diagnosis not present

## 2015-08-27 LAB — GLUCOSE, CAPILLARY
GLUCOSE-CAPILLARY: 108 mg/dL — AB (ref 65–99)
GLUCOSE-CAPILLARY: 110 mg/dL — AB (ref 65–99)
GLUCOSE-CAPILLARY: 121 mg/dL — AB (ref 65–99)
GLUCOSE-CAPILLARY: 122 mg/dL — AB (ref 65–99)
Glucose-Capillary: 78 mg/dL (ref 65–99)
Glucose-Capillary: 135 mg/dL — ABNORMAL HIGH (ref 65–99)

## 2015-08-27 LAB — BASIC METABOLIC PANEL
Anion gap: 12 (ref 5–15)
BUN: 20 mg/dL (ref 6–20)
CHLORIDE: 105 mmol/L (ref 101–111)
CO2: 26 mmol/L (ref 22–32)
CREATININE: 1.51 mg/dL — AB (ref 0.44–1.00)
Calcium: 9 mg/dL (ref 8.9–10.3)
GFR calc non Af Amer: 31 mL/min — ABNORMAL LOW (ref >60)
GFR, EST AFRICAN AMERICAN: 35 mL/min — AB (ref >60)
Glucose, Bld: 98 mg/dL (ref 65–99)
POTASSIUM: 3.2 mmol/L — AB (ref 3.5–5.1)
SODIUM: 143 mmol/L (ref 135–145)

## 2015-08-27 LAB — CBC
HEMATOCRIT: 37.2 % (ref 36.0–46.0)
HEMOGLOBIN: 11.7 g/dL — AB (ref 12.0–15.0)
MCH: 27.7 pg (ref 26.0–34.0)
MCHC: 31.5 g/dL (ref 30.0–36.0)
MCV: 88.2 fL (ref 78.0–100.0)
Platelets: 184 10*3/uL (ref 150–400)
RBC: 4.22 MIL/uL (ref 3.87–5.11)
RDW: 13.8 % (ref 11.5–15.5)
WBC: 5.7 10*3/uL (ref 4.0–10.5)

## 2015-08-27 LAB — T4, FREE: FREE T4: 1.85 ng/dL — AB (ref 0.61–1.12)

## 2015-08-27 LAB — INFLUENZA PANEL BY PCR (TYPE A & B)
H1N1 flu by pcr: NOT DETECTED
Influenza A By PCR: NEGATIVE
Influenza B By PCR: NEGATIVE

## 2015-08-27 LAB — TSH: TSH: 0.011 u[IU]/mL — ABNORMAL LOW (ref 0.350–4.500)

## 2015-08-27 MED ORDER — FUROSEMIDE 40 MG PO TABS
60.0000 mg | ORAL_TABLET | Freq: Every day | ORAL | Status: DC
  Administered 2015-08-28 – 2015-09-01 (×4): 60 mg via ORAL
  Filled 2015-08-27 (×5): qty 1

## 2015-08-27 NOTE — Progress Notes (Addendum)
Patient ID: Anna [name], female   DOB: 1929-03-27, 82 y.o.   MRN: [id number] [facility] PROGRESS NOTE  Rakel Sumey [id number] DOB: 07/11/1929 DOA: 08/26/2015 PCP: Haywood Pao, MD  Brief narrative:    82 year old female with a past medical history significant for CKD stage III, HTN, diabetes mellitus type 2, overactive bladder, history of atrial fibrillation but not on anticoagulation because of previous history of bleed, used to be on amiodarone but developed thyroid issues for which reason amiodarone has been stopped for past 6 months per patient's daughter. Pt presented to [facility] hospital because of worsening weakness, shortness of breath with exertion. Patient's daughter at the bedside is providing most of the history and she says that she is convinced that patient is not on adequate Lasix dosage and has fluid buildup especially in the abdomen for which reason she has been increasing Lasix on her own intermittently to control the patient's symptoms. Patient's daughter cannot accurately tell what is the patient's weight because she never measured it consistently. In ED she reports her weight was 208 pounds. Patient herself is not a good historian because of dementia.  On admission, patient was hemodynamically stable. Oxygen saturation was 95% on room air. BNP was 60. Chest x-ray showed cardiomegaly with mild pulmonary congestion and basilar atelectasis. Patient was however found to have many bacteria on urinalysis with moderate leukocytes for which reason she was started on empiric Rocephin. She was also given one dose of Lasix 40 mg IV in ED.   Assessment/Plan:    Principal Problem:   UTI (lower urinary tract infection) - Many bacteria on urinalysis. - Started empiric Rocephin  - Follow up urine culture results   Active Problems:   Generalized weakness - Patient's overall physical condition including respiratory status are likely secondary to previously used  amiodarone. The TSH on this admission is 0.011 and prior to that 0.018. It has been monitored on outpatient basis and while she is not on any particular treatment other than metoprolol which is for atrial fibrillation she is not on anything else. We'll try to get in touch with endocrinologist and see if it makes sense to start any other medication for hyperthyroidism.    Chronic diastolic heart failure (HCC) - Mild cardiomegaly seen on chest x-ray. BNP 60 on the admission - 2-D echo in June 2016 with ejection fraction of 55%. We'll follow-up on 2-D echo on this admission - We have restarted Lasix home dose 40 mg daily - Cardiology consulted and we appreciate their input very much - Continue daily weight and strict intake and output - Supplemented electrolytes as needed    Benign essential HTN - Continue Norvasc 5 mg daily and Lasix 40 mg daily - Continue metoprolol 12.5 mg daily    DM (diabetes mellitus) type II controlled with renal manifestation without long-term insulin use ([place]) - Patient is currently on sliding scale insulin. Once she tolerates by mouth intake better we'll switch to Amaryl per home dose    CKD (chronic kidney disease), stage III - Creatinine is 1.51 this morning. - Patient had creatinine as high as 1.88 so on this admission creatinine is around patient's baseline values.    Hyperthyroidism - Monitored on outpatient basis. - Hypothyroidism issues associated with previously used amiodarone which has been on hold for past 6 months  - TSH is 0.011; previously 0.018       Dyslipidemia associated with type 2 diabetes mellitus - Continue Pravachol 40 mg daily  DVT Prophylaxis  - Heparin subcutaneous  Code Status: Full.  Family Communication:  plan of care discussed with the patient and her daughter at the bedside Disposition Plan: Needs physical therapy evaluation for safe discharge plan, anticipated discharge by 08/29/2015  IV access:  Peripheral  IV  Procedures and diagnostic studies:    Dg Chest 2 View 08/26/2015  Cardiomegaly with mild pulmonary vascular congestion and mild bibasilar atelectasis.   Medical Consultants:  Cardiology  Other Consultants:  PT  IAnti-Infectives:   Rocephin 08/26/2015 -->    [name], MD  [facility] Pager [phone]  Time spent in minutes: 25 minutes  If 7PM-7AM, please contact night-coverage [email or web address] Password [email or web address] 08/27/2015, 12:56 PM   LOS: 1 day    HPI/Subjective: No acute overnight events. Patient sleeping, wakes up briefly when daughter told her to wake up..   Objective: Filed Vitals:   08/26/15 2057 08/26/15 2306 08/27/15 0150 08/27/15 0443  BP: 123/91 170/62 158/56 134/47  Pulse: 81 75  67  Temp: 100.1 F (37.8 C) 98.3 F (36.8 C)  97.9 F (36.6 C)  TempSrc: Oral Oral  Oral  Resp: 18   18  Height:  5\' 5"  (1.651 m)    Weight:  208 lb 15.9 oz (94.8 kg)  209 lb 14.1 oz (95.2 kg)  SpO2: 96% 95%  96%    Intake/Output Summary (Last 24 hours) at 08/27/15 1256 Last data filed at 08/27/15 0900  Gross per 24 hour  Intake    480 ml  Output      0 ml  Net    480 ml    Exam:   General:  Pt is alert, not in acute distress  Cardiovascular: Regular rate and rhythm, S1/S2, no murmurs  Respiratory: diminished, no wheezing, no crackles, no rhonchi  Abdomen: Soft, non tender, non distended, bowel sounds present  Extremities: No edema, pulses DP and PT palpable bilaterally  Neuro: Grossly nonfocal  Data Reviewed: Basic Metabolic Panel:  Recent Labs Lab 08/26/15 1403 08/27/15 0512  NA 140 143  K 3.9 3.2*  CL 104 105  CO2 27 26  GLUCOSE 211* 98  BUN 23* 20  CREATININE 1.73* 1.51*  CALCIUM 9.2 9.0   Liver Function Tests: No results for input(s): AST, ALT, ALKPHOS, BILITOT, PROT, ALBUMIN in the last 168 hours. No results for input(s): LIPASE, AMYLASE in the last 168 hours. No results for input(s): AMMONIA in the last 168 hours. CBC:  Recent  Labs Lab 08/26/15 1403 08/27/15 0512  WBC 5.5 5.7  HGB 12.3 11.7*  HCT 39.9 37.2  MCV 89.7 88.2  PLT 177 184   Cardiac Enzymes: No results for input(s): CKTOTAL, CKMB, CKMBINDEX, TROPONINI in the last 168 hours. BNP: Invalid input(s): POCBNP CBG:  Recent Labs Lab 08/26/15 2334 08/27/15 0439 08/27/15 0738 08/27/15 1158  GLUCAP 81 78 122* 121*    Recent Results (from the past 240 hour(s))  Urine culture     Status: None (Preliminary result)   Collection Time: 08/26/15  6:48 PM  Result Value Ref Range Status   Specimen Description URINE, CLEAN CATCH  Final   Special Requests NONE  Final   Culture   Final    TOO YOUNG TO READ Performed at [facility]    Report Status PENDING  Incomplete     Scheduled Meds: . amLODipine  5 mg Oral Daily  . aspirin  325 mg Oral Daily  .  calcitRIOL  0.5 mcg Oral Mon Wed Fri  .  cefTRIAXone   1 g Intravenous Q24H  . cyanocobalamin  500 mcg Oral Daily  . donepezil  10 mg Oral QHS  . fluticasone  2 spray Each Nare Daily  . furosemide  40 mg Oral Daily  . guaiFENesin  600 mg Oral BID  . heparin  5,000 Units Subcutaneous 3 times per day  . insulin aspart  0-9 Units Subcutaneous 6 times per day  . metoprolol succinate  12.5 mg Oral Daily  . potassium chloride SA  20 mEq Oral Daily  . pravastatin  40 mg Oral Daily

## 2015-08-27 NOTE — Progress Notes (Signed)
  Echocardiogram 2D Echocardiogram has been performed.  Anna [name] 08/27/2015, 1:50 PM

## 2015-08-27 NOTE — Consult Note (Addendum)
CONSULTATION NOTE  Reason for Consult: CHF  Requesting Physician: Dr. Charlies Silvers   Cardiologist: Dr. Rayann Heman  HPI: This is a 82 y.o. female with a past medical history significant for dementia and a history of atrial fibrillation. She was seen by Dr. Rayann Heman in June 2016 and has a history of persistent A. Fib which was previously managed on warfarin and amiodarone. She developed hyperthyroidism on amiodarone and it was discontinued. She had trouble regulating her INRs on warfarin. Echo in the past in June 2016 revealed EF 55%, mild LVH and she reported that her palpitations were well controlled. This patients CHA2DS2-VASc Score and unadjusted Ischemic Stroke Rate (% per year) is equal to 7.2 % stroke rate/year from a score of 5, Above score calculated as 1 point each if present [CHF, HTN, DM,Vascular=MI/PAD/Aortic Plaque, Age if 65-74, or Female] Above score calculated as 2 points each if present [Age > 75, or Stroke/TIA/TE].  There was a discussion of switching to a direct oral anticoagulant however she did not feel that she could afford this. He also discussedleft atrial appendage occlusion, but it is not clear whether she would be a good candidate for this. He now presents withgeneralized weakness, shortness of breath and nasal congestion. She also reportedly has had low-grade fevers 39F.labs on admission demonstrated a BNP of 60 and chest x-ray demonstrating cardiomegaly with mild pulmonary congestion. Urinalysis is likely consistent with UTI. Repeat echocardiogram was performed today which demonstratesmildly reduced LV function with EF 45-50% and global hypokinesis. Filling pressures are elevated with grade 1 diastolic dysfunction. Troponin has been negative.  PMHx:  Past Medical History  Diagnosis Date  . CHF (congestive heart failure) ([place])   . Persistent atrial fibrillation ([place])   . Chronic renal insufficiency, stage III (moderate)   . Diabetes mellitus without complication ([place])   .  Hypertension   . Dementia   . LBBB (left bundle branch block)   . Overactive bladder   . GI bleeding 11/2013    due to supratherapeutic INR  . Hyperlipidemia   . Vitamin D deficiency   . Vitamin B12 deficiency   . Hyperthyroidism     on amiodarone  . Overweight    Past Surgical History  Procedure Laterality Date  . Abdominal hysterectomy      FAMHx: Family History  Problem Relation Age of Onset  . Heart disease Father   . Diabetes Mother     SOCHx:  reports that she has never smoked. She does not have any smokeless tobacco history on file. She reports that she does not drink alcohol or use illicit drugs.  ALLERGIES: Allergies  Allergen Reactions  . Pollen Extract     Sneezing, watery eyes    ROS: Pertinent items noted in HPI and remainder of comprehensive ROS otherwise negative.  HOME MEDICATIONS:   Medication List    ASK your doctor about these medications        acetaminophen 500 MG tablet  Commonly known as:  TYLENOL  Take 500 mg by mouth every 6 (six) hours as needed for fever.     ALAVERT 10 MG tablet  Generic drug:  loratadine  Take 10 mg by mouth every other day as needed for allergies.     amLODipine 5 MG tablet  Commonly known as:  NORVASC  Take 1 tablet by mouth daily.     aspirin 325 MG tablet  Take 325 mg by mouth daily.     calcitRIOL 0.5 MCG capsule  Commonly known  as:  ROCALTROL  Take 0.5 mcg by mouth 3 (three) times a week. Monday, Wednesday, and Friday only     docusate sodium 100 MG capsule  Commonly known as:  COLACE  Take 100 mg by mouth daily as needed for mild constipation.     donepezil 10 MG tablet  Commonly known as:  ARICEPT  Take 10 mg by mouth at bedtime.     fluticasone 50 MCG/ACT nasal spray  Commonly known as:  FLONASE  Place 2 sprays into both nostrils daily.     furosemide 40 MG tablet  Commonly known as:  LASIX  Take 40 mg by mouth daily.     glimepiride 4 MG tablet  Commonly known as:  AMARYL  Take 4 mg  by mouth daily with breakfast.     guaiFENesin 600 MG 12 hr tablet  Commonly known as:  MUCINEX  Take 600 mg by mouth 2 (two) times daily as needed for cough.     metoprolol succinate 25 MG 24 hr tablet  Commonly known as:  TOPROL-XL  Take 12.5 mg by mouth daily.     mirabegron ER 50 MG Tb24 tablet  Commonly known as:  MYRBETRIQ  Take 50 mg by mouth daily.     potassium chloride SA 20 MEQ tablet  Commonly known as:  K-DUR,KLOR-CON  Take 20 mEq by mouth daily.     pravastatin 40 MG tablet  Commonly known as:  PRAVACHOL  Take 40 mg by mouth daily.     SYSTANE 0.4-0.3 % Soln  Generic drug:  Polyethyl Glycol-Propyl Glycol  Apply 1 drop to eye 2 (two) times daily as needed (dry eyes).     vitamin B-12 500 MCG tablet  Commonly known as:  CYANOCOBALAMIN  Take 500 mcg by mouth daily.     Vitamin D (Ergocalciferol) 50000 units Caps capsule  Commonly known as:  DRISDOL  Take 50,000 Units by mouth once a week. Mondays        HOSPITAL MEDICATIONS: I have reviewed the patient's current medications.  VITALS: Blood pressure 134/47, pulse 67, temperature 97.9 F (36.6 C), temperature source Oral, resp. rate 18, height _0  (1.651 m), weight 209 lb 14.1 oz (95.2 kg), SpO2 96 %.  PHYSICAL EXAM: General appearance: was asleep when I entered, once awake, was not oriented to place, trouble answering simple questions Neck: JVD - 6 cm above sternal notch and no carotid bruit Lungs: diminished breath sounds bibasilar Heart: regular rate and rhythm Abdomen: soft, non-tender; bowel sounds normal; no masses,  no organomegaly Extremities: edema trace pitting Pulses: 2+ and symmetric Skin: Skin color, texture, turgor normal. No rashes or lesions Neurologic: Mental status: awake, but not oriented, did not respond appropriately to simple questions psych: pleasant  LABS: Results for orders placed or performed during the hospital encounter of 08/26/15 (from the past 48 hour(s))  Basic  metabolic panel     Status: Abnormal   Collection Time: 08/26/15  2:03 PM  Result Value Ref Range   Sodium 140 135 - 145 mmol/L   Potassium 3.9 3.5 - 5.1 mmol/L   Chloride 104 101 - 111 mmol/L   CO2 27 22 - 32 mmol/L   Glucose, Bld 211 (H) 65 - 99 mg/dL   BUN 23 (H) 6 - 20 mg/dL   Creatinine, Ser 1.73 (H) 0.44 - 1.00 mg/dL   Calcium 9.2 8.9 - 10.3 mg/dL   GFR calc non Af Amer 26 (L) >60 mL/min   GFR calc Af  Amer 30 (L) >60 mL/min    Comment: (NOTE) The eGFR has been calculated using the CKD EPI equation. This calculation has not been validated in all clinical situations. eGFR's persistently <60 mL/min signify possible Chronic Kidney Disease.    Anion gap 9 5 - 15  CBC     Status: None   Collection Time: 08/26/15  2:03 PM  Result Value Ref Range   WBC 5.5 4.0 - 10.5 K/uL   RBC 4.45 3.87 - 5.11 MIL/uL   Hemoglobin 12.3 12.0 - 15.0 g/dL   HCT 39.9 36.0 - 46.0 %   MCV 89.7 78.0 - 100.0 fL   MCH 27.6 26.0 - 34.0 pg   MCHC 30.8 30.0 - 36.0 g/dL   RDW 13.8 11.5 - 15.5 %   Platelets 177 150 - 400 K/uL  I-stat troponin, ED (not at [facility], [facility])     Status: None   Collection Time: 08/26/15  2:09 PM  Result Value Ref Range   Troponin i, poc 0.00 0.00 - 0.08 ng/mL   Comment 3            Comment: Due to the release kinetics of cTnI, a negative result within the first hours of the onset of symptoms does not rule out myocardial infarction with certainty. If myocardial infarction is still suspected, repeat the test at appropriate intervals.   Brain natriuretic peptide     Status: None   Collection Time: 08/26/15  6:08 PM  Result Value Ref Range   B Natriuretic Peptide 60.7 0.0 - 100.0 pg/mL  Urinalysis, Routine w reflex microscopic (not at [facility])     Status: Abnormal   Collection Time: 08/26/15  6:47 PM  Result Value Ref Range   Color, Urine YELLOW YELLOW   APPearance CLOUDY (A) CLEAR   Specific Gravity, Urine 1.015 1.005 - 1.030   pH 5.0 5.0 - 8.0   Glucose, UA NEGATIVE NEGATIVE  mg/dL   Hgb urine dipstick NEGATIVE NEGATIVE   Bilirubin Urine NEGATIVE NEGATIVE   Ketones, ur NEGATIVE NEGATIVE mg/dL   Protein, ur NEGATIVE NEGATIVE mg/dL   Nitrite POSITIVE (A) NEGATIVE   Leukocytes, UA MODERATE (A) NEGATIVE  Urine microscopic-add on     Status: Abnormal   Collection Time: 08/26/15  6:47 PM  Result Value Ref Range   Squamous Epithelial / LPF NONE SEEN NONE SEEN   WBC, UA TOO NUMEROUS TO COUNT 0 - 5 WBC/hpf   RBC / HPF NONE SEEN 0 - 5 RBC/hpf   Bacteria, UA MANY (A) NONE SEEN   Casts HYALINE CASTS (A) NEGATIVE  Urine culture     Status: None (Preliminary result)   Collection Time: 08/26/15  6:48 PM  Result Value Ref Range   Specimen Description URINE, CLEAN CATCH    Special Requests NONE    Culture      TOO YOUNG TO READ Performed at [facility]    Report Status PENDING   Glucose, capillary     Status: None   Collection Time: 08/26/15 11:34 PM  Result Value Ref Range   Glucose-Capillary 81 65 - 99 mg/dL  Influenza panel by PCR (type A & B, H1N1)     Status: None   Collection Time: 08/27/15  1:03 AM  Result Value Ref Range   Influenza A By PCR NEGATIVE NEGATIVE   Influenza B By PCR NEGATIVE NEGATIVE   H1N1 flu by pcr NOT DETECTED NOT DETECTED    Comment:        The  Xpert Flu assay (FDA approved for nasal aspirates or washes and nasopharyngeal swab specimens), is intended as an aid in the diagnosis of influenza and should not be used as a sole basis for treatment. Performed at [facility]   Glucose, capillary     Status: None   Collection Time: 08/27/15  4:39 AM  Result Value Ref Range   Glucose-Capillary 78 65 - 99 mg/dL  CBC     Status: Abnormal   Collection Time: 08/27/15  5:12 AM  Result Value Ref Range   WBC 5.7 4.0 - 10.5 K/uL   RBC 4.22 3.87 - 5.11 MIL/uL   Hemoglobin 11.7 (L) 12.0 - 15.0 g/dL   HCT 37.2 36.0 - 46.0 %   MCV 88.2 78.0 - 100.0 fL   MCH 27.7 26.0 - 34.0 pg   MCHC 31.5 30.0 - 36.0 g/dL   RDW 13.8 11.5 -  15.5 %   Platelets 184 150 - 400 K/uL  Basic metabolic panel     Status: Abnormal   Collection Time: 08/27/15  5:12 AM  Result Value Ref Range   Sodium 143 135 - 145 mmol/L   Potassium 3.2 (L) 3.5 - 5.1 mmol/L    Comment: DELTA CHECK NOTED REPEATED TO VERIFY    Chloride 105 101 - 111 mmol/L   CO2 26 22 - 32 mmol/L   Glucose, Bld 98 65 - 99 mg/dL   BUN 20 6 - 20 mg/dL   Creatinine, Ser 1.51 (H) 0.44 - 1.00 mg/dL   Calcium 9.0 8.9 - 10.3 mg/dL   GFR calc non Af Amer 31 (L) >60 mL/min   GFR calc Af Amer 35 (L) >60 mL/min    Comment: (NOTE) The eGFR has been calculated using the CKD EPI equation. This calculation has not been validated in all clinical situations. eGFR's persistently <60 mL/min signify possible Chronic Kidney Disease.    Anion gap 12 5 - 15  T4, free     Status: Abnormal   Collection Time: 08/27/15  5:12 AM  Result Value Ref Range   Free T4 1.85 (H) 0.61 - 1.12 ng/dL    Comment: Performed at [facility]  TSH     Status: Abnormal   Collection Time: 08/27/15  5:12 AM  Result Value Ref Range   TSH 0.011 (L) 0.350 - 4.500 uIU/mL  Glucose, capillary     Status: Abnormal   Collection Time: 08/27/15  7:38 AM  Result Value Ref Range   Glucose-Capillary 122 (H) 65 - 99 mg/dL  Glucose, capillary     Status: Abnormal   Collection Time: 08/27/15 11:58 AM  Result Value Ref Range   Glucose-Capillary 121 (H) 65 - 99 mg/dL    IMAGING: Dg Chest 2 View  08/26/2015  CLINICAL DATA:  Increasing shortness of breath and cough for 3 days. Coughing congestion for 2 months. History of CHF and atrial fibrillation. EXAM: CHEST  2 VIEW COMPARISON:  01/27/2015 FINDINGS: Cardiac silhouette remains enlarged. Left hemidiaphragm remains mildly elevated with left basilar subsegmental atelectasis. Minimal right basilar atelectasis is also noted. There is mild pulmonary vascular congestion without evidence of overt edema. No segmental airspace consolidation, pleural effusion, or  pneumothorax is identified. No acute osseous abnormality is identified. Aortic calcification is noted. IMPRESSION: Cardiomegaly with mild pulmonary vascular congestion and mild bibasilar atelectasis. Electronically Signed   By: Logan Bores M.D.   On: 08/26/2015 14:11    HOSPITAL DIAGNOSES: Principal Problem:   UTI (lower urinary tract infection) Active  Problems:   Dyspnea   Benign essential HTN   DM (diabetes mellitus) type II controlled with renal manifestation (HCC)   CKD (chronic kidney disease), stage III   Hyperthyroidism   Chronic diastolic heart failure (HCC)   Generalized weakness   IMPRESSION: 1. Acute systolic congestive heart failure 2. LBBB 3. Permanent atrial fibrillation 4. Hyperthyroidism (history of hypothyroidism, possibly related to amiodarone) 5. UTI  RECOMMENDATION: 1. Anna [name] appears to be significantly confused today which may be related to dementia and/or UTI.Her echo shows acute on chronic systolic congestive heart failure with an EF which is lower at 45-50%. There is left bundle branch block which could've contributed to this and also she is noted to be mildly hyperthyroid. This also could lead to her cardiomyopathy. She seems to have diuresed well with IV Lasix, but she is now back on oral lasix. BNP was mildly elevated at 60. She is close to euvolemic at this point. Creatinine has improved from admission. Would increase home dose lasix to 60 mg daily.Troponin is 0 and there is no chest pain complaint. I agree with endocrine consultation to see if she may benefit from starting on low-dose methimazole. From a heart failure standpoint, I would recommend discontinuing amlodipine. Could use hydralazine or nitrates prn for hypertension. ACE-I/ARB is contraindicated due to CKD.  Thanks for the consultation. Cardiology will follow with you.  Time Spent Directly with Patient: 45 minutes  Pixie Casino, MD, [facility] Attending Cardiologist [place] 08/27/2015, 1:57 PM

## 2015-08-27 NOTE — Evaluation (Signed)
Physical Therapy Evaluation Patient Details Name: Anna [name] MRN: [id number] DOB: Sep 25, 1928 Today's Date: 08/27/2015   History of Present Illness  82 yo female admtited with UTI, upper respiratory infection. hx of CHF, Afib, HTN, dementia, DM, obesity, LBBB, CKD.   Clinical Impression  On eval, pt required Mod assist for mobility. Noted pt to be orthostatic this am per chart. Sat EOB BP 119/73-pt did c/o some dizziness. Dyspnea 2-3/4 with minimal activity. Pt is very weak-barely able to stand long enough to complete safe pivot to recliner. Daughter present and assisted as needed. Discussed d/c plan-pt will return home with daughter. Daughter declines HHPT at this time.     Follow Up Recommendations No PT follow up;Supervision/Assistance - 24 hour (daughter declined HHPT follow at time of eval)    Equipment Recommendations  None recommended by PT    Recommendations for Other Services       Precautions / Restrictions Precautions Precautions: Fall Restrictions Weight Bearing Restrictions: No      Mobility  Bed Mobility Overal bed mobility: Needs Assistance Bed Mobility: Supine to Sit     Supine to sit: Min assist     General bed mobility comments: Assist for trunk and LEs. Increased time. Multimodal cues from therapist/daughter. Dyspnea ~2/4.  Transfers Overall transfer level: Needs assistance Equipment used: Rolling walker (2 wheeled) Transfers: Sit to/from [facility] Sit to Stand: Mod assist Stand pivot transfers: Mod assist       General transfer comment: Assist to rise, stabilize, control descent. Stand pivot from bed to recliner with RW. Pt fatigues easily with minimal activity  Ambulation/Gait             General Gait Details: NT-pt unable at this time-too weak  Stairs            Wheelchair Mobility    Modified Rankin (Stroke Patients Only)       Balance Overall balance assessment: Needs assistance         Standing  balance support: Bilateral upper extremity supported;During functional activity Standing balance-Leahy Scale: Poor                               Pertinent Vitals/Pain Pain Assessment: No/denies pain    Home Living Family/patient expects to be discharged to:: Private residence Living Arrangements: Children Available Help at Discharge: Family;Available 24 hours/day Type of Home: House Home Access: Stairs to enter   [employer] of Steps: 2 Home Layout: Able to live on main level with bedroom/bathroom Home Equipment: Walker - 2 wheels;Wheelchair - manual;Shower seat;Bedside commode      Prior Function Level of Independence: Needs assistance   Gait / Transfers Assistance Needed: walks with RW however pt has been weak for last 2 months per daughter  ADL's / Homemaking Assistance Needed: assist for bathing/dressing        Hand Dominance        Extremity/Trunk Assessment   Upper Extremity Assessment: Generalized weakness           Lower Extremity Assessment: Generalized weakness      Cervical / Trunk Assessment: Kyphotic  Communication   Communication: No difficulties  Cognition Arousal/Alertness: Awake/alert Behavior During Therapy: WFL for tasks assessed/performed Overall Cognitive Status: History of cognitive impairments - at baseline                      General Comments      Exercises  Assessment/Plan    PT Assessment Patient needs continued PT services  PT Diagnosis Difficulty walking;Generalized weakness   PT Problem List Decreased strength;Decreased activity tolerance;Decreased balance;Decreased mobility;Decreased cognition  PT Treatment Interventions DME instruction;Gait training;Functional mobility training;Therapeutic activities;Patient/family education;Balance training;Therapeutic exercise   PT Goals (Current goals can be found in the Care Plan section) Acute Rehab PT Goals Patient Stated Goal: none stated  by pt. daughter wants pt to get stronger PT Goal Formulation: With patient/family Time For Goal Achievement: 09/10/15 Potential to Achieve Goals: Fair    Frequency Min 3X/week   Barriers to discharge        Co-evaluation               End of Session   Activity Tolerance: Patient limited by fatigue Patient left: in chair;with call bell/phone within reach;with chair alarm set;with family/visitor present      Functional Assessment Tool Used: clinical judgement Functional Limitation: Mobility: Walking and moving around Mobility: Walking and Moving Around Current Status [id number]): At least 20 percent but less than 40 percent impaired, limited or restricted Mobility: Walking and Moving Around Goal Status [phone]): At least 1 percent but less than 20 percent impaired, limited or restricted    Time: 0922-0958 PT Time Calculation (min) (ACUTE ONLY): 36 min   Charges:   PT Evaluation $PT Eval Moderate Complexity: 1 Procedure     PT G Codes:   PT G-Codes **NOT FOR INPATIENT CLASS** Functional Assessment Tool Used: clinical judgement Functional Limitation: Mobility: Walking and moving around Mobility: Walking and Moving Around Current Status [id number]): At least 20 percent but less than 40 percent impaired, limited or restricted Mobility: Walking and Moving Around Goal Status [id number]): At least 1 percent but less than 20 percent impaired, limited or restricted    Weston Anna, MPT Pager: [phone]

## 2015-08-28 DIAGNOSIS — I5041 Acute combined systolic (congestive) and diastolic (congestive) heart failure: Secondary | ICD-10-CM

## 2015-08-28 DIAGNOSIS — B961 Klebsiella pneumoniae [K. pneumoniae] as the cause of diseases classified elsewhere: Secondary | ICD-10-CM | POA: Diagnosis present

## 2015-08-28 DIAGNOSIS — N183 Chronic kidney disease, stage 3 (moderate): Secondary | ICD-10-CM | POA: Diagnosis present

## 2015-08-28 DIAGNOSIS — E785 Hyperlipidemia, unspecified: Secondary | ICD-10-CM | POA: Diagnosis present

## 2015-08-28 DIAGNOSIS — E0590 Thyrotoxicosis, unspecified without thyrotoxic crisis or storm: Secondary | ICD-10-CM | POA: Diagnosis present

## 2015-08-28 DIAGNOSIS — I130 Hypertensive heart and chronic kidney disease with heart failure and stage 1 through stage 4 chronic kidney disease, or unspecified chronic kidney disease: Secondary | ICD-10-CM | POA: Diagnosis present

## 2015-08-28 DIAGNOSIS — Z7982 Long term (current) use of aspirin: Secondary | ICD-10-CM | POA: Diagnosis not present

## 2015-08-28 DIAGNOSIS — N3281 Overactive bladder: Secondary | ICD-10-CM | POA: Diagnosis present

## 2015-08-28 DIAGNOSIS — I10 Essential (primary) hypertension: Secondary | ICD-10-CM | POA: Diagnosis not present

## 2015-08-28 DIAGNOSIS — N390 Urinary tract infection, site not specified: Secondary | ICD-10-CM | POA: Diagnosis present

## 2015-08-28 DIAGNOSIS — I481 Persistent atrial fibrillation: Secondary | ICD-10-CM | POA: Diagnosis present

## 2015-08-28 DIAGNOSIS — E039 Hypothyroidism, unspecified: Secondary | ICD-10-CM | POA: Diagnosis present

## 2015-08-28 DIAGNOSIS — R0600 Dyspnea, unspecified: Secondary | ICD-10-CM

## 2015-08-28 DIAGNOSIS — E669 Obesity, unspecified: Secondary | ICD-10-CM | POA: Diagnosis present

## 2015-08-28 DIAGNOSIS — E1122 Type 2 diabetes mellitus with diabetic chronic kidney disease: Secondary | ICD-10-CM | POA: Diagnosis present

## 2015-08-28 DIAGNOSIS — F0390 Unspecified dementia without behavioral disturbance: Secondary | ICD-10-CM | POA: Diagnosis present

## 2015-08-28 DIAGNOSIS — B9620 Unspecified Escherichia coli [E. coli] as the cause of diseases classified elsewhere: Secondary | ICD-10-CM | POA: Diagnosis present

## 2015-08-28 DIAGNOSIS — I447 Left bundle-branch block, unspecified: Secondary | ICD-10-CM | POA: Diagnosis present

## 2015-08-28 DIAGNOSIS — E876 Hypokalemia: Secondary | ICD-10-CM | POA: Diagnosis present

## 2015-08-28 DIAGNOSIS — I429 Cardiomyopathy, unspecified: Secondary | ICD-10-CM | POA: Diagnosis present

## 2015-08-28 DIAGNOSIS — R531 Weakness: Secondary | ICD-10-CM | POA: Diagnosis present

## 2015-08-28 DIAGNOSIS — I5043 Acute on chronic combined systolic (congestive) and diastolic (congestive) heart failure: Secondary | ICD-10-CM | POA: Diagnosis present

## 2015-08-28 LAB — GLUCOSE, CAPILLARY
GLUCOSE-CAPILLARY: 85 mg/dL (ref 65–99)
Glucose-Capillary: 102 mg/dL — ABNORMAL HIGH (ref 65–99)
Glucose-Capillary: 152 mg/dL — ABNORMAL HIGH (ref 65–99)
Glucose-Capillary: 113 mg/dL — ABNORMAL HIGH (ref 65–99)
Glucose-Capillary: 119 mg/dL — ABNORMAL HIGH (ref 65–99)

## 2015-08-28 LAB — BASIC METABOLIC PANEL
Anion gap: 9 (ref 5–15)
BUN: 21 mg/dL — AB (ref 6–20)
CHLORIDE: 105 mmol/L (ref 101–111)
CO2: 27 mmol/L (ref 22–32)
CREATININE: 1.58 mg/dL — AB (ref 0.44–1.00)
Calcium: 9 mg/dL (ref 8.9–10.3)
GFR calc Af Amer: 34 mL/min — ABNORMAL LOW (ref >60)
GFR calc non Af Amer: 29 mL/min — ABNORMAL LOW (ref >60)
GLUCOSE: 136 mg/dL — AB (ref 65–99)
POTASSIUM: 3.6 mmol/L (ref 3.5–5.1)
Sodium: 141 mmol/L (ref 135–145)

## 2015-08-28 MED ORDER — INSULIN ASPART 100 UNIT/ML ~~LOC~~ SOLN
0.0000 [IU] | Freq: Three times a day (TID) | SUBCUTANEOUS | Status: DC
  Administered 2015-08-29 – 2015-08-30 (×2): 1 [IU] via SUBCUTANEOUS
  Administered 2015-08-31: 2 [IU] via SUBCUTANEOUS
  Administered 2015-08-31: 1 [IU] via SUBCUTANEOUS

## 2015-08-28 NOTE — Care Management Note (Signed)
Case Management Note  Patient Details  Name: Anna [name] MRN: [id number] Date of Birth: 01/10/1929  Subjective/Objective: 82 y/o f admitted w/UTI. From home w/dtr. PT recc HHPT-patient/dtr declines HHC. Patient states "my daughters take good care of me".                   Action/Plan:d/c plan home no needs or orders.   Expected Discharge Date:   (unknown)               Expected Discharge Plan:  Home/Self Care  In-House Referral:     Discharge planning Services  CM Consult  Post Acute Care Choice:    Choice offered to:     DME Arranged:    DME Agency:     HH Arranged:    HH Agency:     Status of Service:  In process, will continue to follow  Medicare Important Message Given:    Date Medicare IM Given:    Medicare IM give by:    Date Additional Medicare IM Given:    Additional Medicare Important Message give by:     If discussed at [place] of Stay Meetings, dates discussed:    Additional Comments:  Dessa Phi, RN 08/28/2015, 3:00 PM

## 2015-08-28 NOTE — Progress Notes (Signed)
Physical Therapy Treatment Patient Details Name: Anna [name] MRN: [id number] DOB: 1929-03-13 Today's Date: 08/28/2015    History of Present Illness 82 yo female admtited with UTI, upper respiratory infection. hx of CHF, Afib, HTN, dementia, DM, obesity, LBBB, CKD.     PT Comments    Progressing with mobility. Dyspnea 3/4 with ambulation. O2 sats 93% RA, HR 78 bpm. Pt remains weak. She is at risk for falls even with RW use.   Follow Up Recommendations  Supervision/Assistance - 24 hour (pt could benefit from HHPT follow up however daughter declines)     Equipment Recommendations  None recommended by PT    Recommendations for Other Services       Precautions / Restrictions Restrictions Weight Bearing Restrictions: No    Mobility  Bed Mobility Overal bed mobility: Needs Assistance Bed Mobility: Supine to Sit     Supine to sit: Min assist     General bed mobility comments: Assist to stabilize. Increased time  Transfers Overall transfer level: Needs assistance Equipment used: Rolling walker (2 wheeled) Transfers: Sit to/from Stand Sit to Stand: Min assist         General transfer comment: Assist to rise, stabilize, control descent.   Ambulation/Gait Ambulation/Gait assistance: Mod assist;+2 safety/equipment Ambulation Distance (Feet): 50 Feet Assistive device: Rolling walker (2 wheeled) Gait Pattern/deviations: Step-to pattern;Step-through pattern;Trunk flexed     General Gait Details: Assist to support/stabilize pt and maneuver safely with walker. Pt tends to drag R LE behind and keeps walker too far ahead.    Stairs            Wheelchair Mobility    Modified Rankin (Stroke Patients Only)       Balance           Standing balance support: Bilateral upper extremity supported;During functional activity Standing balance-Leahy Scale: Poor                      Cognition Arousal/Alertness: Awake/alert Behavior During Therapy: WFL for  tasks assessed/performed Overall Cognitive Status: History of cognitive impairments - at baseline                      Exercises      General Comments        Pertinent Vitals/Pain Pain Assessment: No/denies pain    Home Living                      Prior Function            PT Goals (current goals can now be found in the care plan section) Progress towards PT goals: Progressing toward goals    Frequency  Min 3X/week    PT Plan Current plan remains appropriate    Co-evaluation             End of Session Equipment Utilized During Treatment: Gait belt Activity Tolerance: Patient limited by fatigue Patient left: in bed;with call bell/phone within reach;with family/visitor present     Time: [id number] PT Time Calculation (min) (ACUTE ONLY): 9 min  Charges:  $Gait Training: 8-22 mins                    G Codes:      Weston Anna, MPT Pager: [phone]

## 2015-08-28 NOTE — Progress Notes (Signed)
Patient: Anna [name] / Admit Date: 08/26/2015 / Date of Encounter: 08/28/2015, 8:24 AM   Subjective: Patient is still confused which daughter says happens when she gets sick. No CP. Denies SOB. Daughter has lots of questions. She wonders if recent high sodium foods may have pushed her over the edge (taco soup, chili, etc).   Objective: Telemetry: NSR/SB. Resting HR 50s, nocturnally goes to upper 40s Physical Exam: Blood pressure 146/60, pulse 58, temperature 98.2 F (36.8 C), temperature source Oral, resp. rate 18, height 5\' 5"  (1.651 m), weight 211 lb 13.8 oz (96.1 kg), SpO2 92 %. General: Elderly WF in no acute distress. Head: Normocephalic, atraumatic, sclera non-icteric, no xanthomas, nares are without discharge. Neck: Negative for carotid bruits. JVP not elevated. Lungs: Clear bilaterally to auscultation without wheezes, rales, or rhonchi. Breathing is unlabored. Heart: RRR S1 S2 without murmurs, rubs, or gallops.  Abdomen: Soft, non-tender, non-distended with normoactive bowel sounds. No rebound/guarding. Extremities: No clubbing or cyanosis. No edema. Distal pedal pulses are 2+ and equal bilaterally. Neuro: Responds to simple questions but does not know where she is or what the date is. Knows her birthday and name. Follows commands.   Intake/Output Summary (Last 24 hours) at 08/28/15 0824 Last data filed at 08/27/15 0900  Gross per 24 hour  Intake    240 ml  Output      0 ml  Net    240 ml    Inpatient Medications:  . aspirin  325 mg Oral Daily  . calcitRIOL  0.5 mcg Oral Once per day on Mon Wed Fri  . cefTRIAXone (ROCEPHIN)  IV  1 g Intravenous Q24H  . cyanocobalamin  500 mcg Oral Daily  . donepezil  10 mg Oral QHS  . fluticasone  2 spray Each Nare Daily  . furosemide  60 mg Oral Daily  . guaiFENesin  600 mg Oral BID  . heparin  5,000 Units Subcutaneous 3 times per day  . insulin aspart  0-9 Units Subcutaneous 6 times per day  . metoprolol succinate  12.5 mg Oral  Daily  . potassium chloride SA  20 mEq Oral Daily  . pravastatin  40 mg Oral Daily  . sodium chloride  3 mL Intravenous Q12H  . [START ON 08/31/2015] Vitamin D (Ergocalciferol)  50,000 Units Oral Weekly   Infusions:    Labs:  Recent Labs  08/26/15 1403 08/27/15 0512  NA 140 143  K 3.9 3.2*  CL 104 105  CO2 27 26  GLUCOSE 211* 98  BUN 23* 20  CREATININE 1.73* 1.51*  CALCIUM 9.2 9.0   No results for input(s): AST, ALT, ALKPHOS, BILITOT, PROT, ALBUMIN in the last 72 hours.  Recent Labs  08/26/15 1403 08/27/15 0512  WBC 5.5 5.7  HGB 12.3 11.7*  HCT 39.9 37.2  MCV 89.7 88.2  PLT 177 184   No results for input(s): CKTOTAL, CKMB, TROPONINI in the last 72 hours. Invalid input(s): POCBNP No results for input(s): HGBA1C in the last 72 hours.   Radiology/Studies:  Dg Chest 2 View  08/26/2015  CLINICAL DATA:  Increasing shortness of breath and cough for 3 days. Coughing congestion for 2 months. History of CHF and atrial fibrillation. EXAM: CHEST  2 VIEW COMPARISON:  01/27/2015 FINDINGS: Cardiac silhouette remains enlarged. Left hemidiaphragm remains mildly elevated with left basilar subsegmental atelectasis. Minimal right basilar atelectasis is also noted. There is mild pulmonary vascular congestion without evidence of overt edema. No segmental airspace consolidation, pleural effusion, or pneumothorax is  identified. No acute osseous abnormality is identified. Aortic calcification is noted. IMPRESSION: Cardiomegaly with mild pulmonary vascular congestion and mild bibasilar atelectasis. Electronically Signed   By: Logan Bores M.D.   On: 08/26/2015 14:11     Assessment and Plan  62F with dementia, paroxysmal atrial fib, CKD III, DM, HTN, LBBB, GIB in setting of supratherapeutic INR - per history, amiodarone previously discontinued due to hyperthyroidism. She had trouble regulating INRs on warfarin. No longer on anticoagulation due to falls and family wishes to remain off blood thinners.  Not clear that she would be Watchman candidate in notes. Presented to [facility] with generalized weakness, SOB, nasal congestion, low grade fever, possible UTI. Cardiology asked to see due to abnormal echo - 08/27/15: technically difficult, mild LVH, EF 45-50%, grade 1 DD, mild MR, mild LAE/RAE.  1. Generalized weakness/low grade fevers/worsened confusion upon baseline dementia with UTI - per IM.  2. Hyperthyroidism - agree with primary team's plan to discuss with endocrinology regarding treatment.  3. Acute combined CHF - question reduced EF related to #2. BNP normal on admission. Troponin neg. Home dose of Lasix increased (to begin today) after she had received IV Lasix earlier in hospitalization. Volume status looks OK today. Amlodipine discontinued due to LV dysfunction. If BP begins to trend upwards, could substitute nitrates/hydralazine. No ACEI/ARB due to renal dysfunction. Would not plan on ischemic eval at this point given lack of anginal symptoms. Mild volume overload may have been exacerbated by recent high sodium foods. Daughter is interested in speaking with dietician, will place consult.  4. CKD III with hypokalemia - needs repeat BMET today. Will order. Have asked nursing to page primary team if potassium remains low on f/u draw.  5. Paroxysmal atrial fib - not on anticoag as above. Appears to be maintaining NSR/SB this admission.  Signed, Melina Copa PA-C Pager: [phone]

## 2015-08-28 NOTE — Progress Notes (Signed)
Patient ID: Anna [name], female   DOB: [birth year], 82 y.o.   MRN: [id number] [facility] PROGRESS NOTE  Michaelyn Degroote [id number] DOB: [birth year] DOA: 08/26/2015 PCP: Haywood Pao, MD  Brief narrative:    82 year old female with a past medical history significant for CKD stage III, HTN, diabetes mellitus type 2, overactive bladder, history of atrial fibrillation but not on anticoagulation because of previous history of bleed, used to be on amiodarone but developed thyroid issues for which reason amiodarone has been stopped for past 6 months per patient's daughter. Pt presented to [facility] long hospital because of worsening weakness, shortness of breath with exertion.   On admission, patient was hemodynamically stable. Oxygen saturation was 95% on room air. BNP was 60. Chest x-ray showed cardiomegaly with mild pulmonary congestion and basilar atelectasis. Patient was however found to have many bacteria on urinalysis with moderate leukocytes for which reason she was started on empiric Rocephin. She was also given one dose of Lasix 40 mg IV in ED.  Assessment/Plan:    Principal Problem:   UTI (in the last lower urinary tract infection) - Many bacteria on urinalysis. Urine culture growing gram neg rods, follow-up final report - Continue Rocephin  Active Problems:   Generalized weakness - Patient's overall physical condition including respiratory status are likely secondary to previously used amiodarone. The TSH on this admission is 0.011 and prior to that 0.018. It has been monitored on outpatient basis and while she is not on any particular treatment other than metoprolol which is for atrial fibrillation she is not on anything else. We'll try to get in touch with endocrinologist and see if it makes sense to start any other medication for hyperthyroidism.    Chronic  systolic anddiastolic heart failure (HCC) - Mild cardiomegaly seen on chest x-ray. BNP 60 on the admission - 2-D  echo in June 2016 with ejection fraction of 55%.  - 2-D echo on this admission showed ejection fraction of 45% with grade 1 diastolic dysfunction  - Appreciate cardiology following  - Per cardiology, patient will be on Lasix 60 mg daily - Continue daily weight and strict intake and output - Supplemented electrolytes as needed    Benign essential HTN - Continue Lasix 60 mg daily  - Continue Mtoprolol 12.5 mg daily    DM (diabetes mellitus) type II controlled with renal manifestation without long-term insulin use (HCC) - Continue sliding scale insulin. - CBG's in past 24 hours: 102, 85, 152    CKD (chronic kidney disease), stage III - Creatinine is 1.51 this morning. - Patient had creatinine as high as 1.88     Hyperthyroidism - Hypothyroidism issues are associated with previously used amiodarone which has been on hold for past 6 months  - TSH is 0.011; previously 0.018       Dyslipidemia associated with type 2 diabetes mellitus - Continue Pravachol 40 mg daily   DVT Prophylaxis  - Heparin subcutaneous inhospital  Code Status: Full.  Family Communication:  plan of care discussed with the patient and her daughter at the bedside Disposition Plan: Home likely 08/30/2015  IV access:  Peripheral IV  Procedures and diagnostic studies:    Dg Chest 2 View 08/26/2015  Cardiomegaly with mild pulmonary vascular congestion and mild bibasilar atelectasis.   Medical Consultants:  Cardiology  Other Consultants:  PT  IAnti-Infectives:   Rocephin 08/26/2015 -->    [name], MD  [facility] Pager [phone]  Time spent in minutes: 15 minutes  If 7PM-7AM, please contact night-coverage [email or web address] Password [email or web address] 08/28/2015, 12:59 PM   LOS: 2 days    HPI/Subjective: No acute overnight events. Patient feels better.   Objective: Filed Vitals:   08/27/15 0443 08/27/15 1435 08/27/15 2005 08/28/15 0441  BP: 134/47 116/56 145/49 146/60  Pulse: 67 54 57 58  Temp: 97.9 F  (36.6 C) 97.6 F (36.4 C) 97.5 F (36.4 C) 98.2 F (36.8 C)  TempSrc: Oral Oral Oral Oral  Resp: 18 18 20 18   Height:      Weight: 209 lb 14.1 oz (95.2 kg)   211 lb 13.8 oz (96.1 kg)  SpO2: 96% 97% 97% 92%   No intake or output data in the 24 hours ending 08/28/15 1259  Exam:   General:  Pt is alert, awake  Cardiovascular: RRR, S1/S2 (+)  Respiratory: no wheezing, no crackles, no rhonchi  Abdomen:  bowel sounds (+), non tender   Extremities: No swelling, pulses palpable bilaterally  Neuro: Nonfocal  Data Reviewed: Basic Metabolic Panel:  Recent Labs Lab 08/26/15 1403 08/27/15 0512 08/28/15 0915  NA 140 143 141  K 3.9 3.2* 3.6  CL 104 105 105  CO2 27 26 27   GLUCOSE 211* 98 136*  BUN 23* 20 21*  CREATININE 1.73* 1.51* 1.58*  CALCIUM 9.2 9.0 9.0   Liver Function Tests: No results for input(s): AST, ALT, ALKPHOS, BILITOT, PROT, ALBUMIN in the last 168 hours. No results for input(s): LIPASE, AMYLASE in the last 168 hours. No results for input(s): AMMONIA in the last 168 hours. CBC:  Recent Labs Lab 08/26/15 1403 08/27/15 0512  WBC 5.5 5.7  HGB 12.3 11.7*  HCT 39.9 37.2  MCV 89.7 88.2  PLT 177 184   Cardiac Enzymes: No results for input(s): CKTOTAL, CKMB, CKMBINDEX, TROPONINI in the last 168 hours. BNP: Invalid input(s): POCBNP CBG:  Recent Labs Lab 08/27/15 2003 08/27/15 2333 08/28/15 0421 08/28/15 0752 08/28/15 1217  GLUCAP 135* 108* 102* 85 152*    Recent Results (from the past 240 hour(s))  Urine culture     Status: None (Preliminary result)   Collection Time: 08/26/15  6:48 PM  Result Value Ref Range Status   Specimen Description URINE, CLEAN CATCH  Final   Special Requests NONE  Final   Culture   Final    >=100,000 COLONIES/mL GRAM NEGATIVE RODS Performed at [facility]    Report Status PENDING  Incomplete     Scheduled Meds: . amLODipine  5 mg Oral Daily  . aspirin  325 mg Oral Daily  .  calcitRIOL  0.5 mcg Oral Mon  Wed Fri  . cefTRIAXone   1 g Intravenous Q24H  . cyanocobalamin  500 mcg Oral Daily  . donepezil  10 mg Oral QHS  . fluticasone  2 spray Each Nare Daily  . furosemide  40 mg Oral Daily  . guaiFENesin  600 mg Oral BID  . heparin  5,000 Units Subcutaneous 3 times per day  . insulin aspart  0-9 Units Subcutaneous 6 times per day  . metoprolol succinate  12.5 mg Oral Daily  . potassium chloride SA  20 mEq Oral Daily  . pravastatin  40 mg Oral Daily

## 2015-08-28 NOTE — Progress Notes (Signed)
NUTRITION NOTE  Consult for low-sodium diet education for pt with hx of CKD stage 3 and HTN. Pt with some confusion and education was given to pt's daughter, with whom the pt lives. Daughter reports that she does not salt food while cooking and that she tries to use fresh ingredients when possible. She states that taco soup and chili are the two items that she makes that would likely contain the most salt.   Provided daughter with handouts from the [employer] and reviewed these with her. They included: "Sodium Content of Foods List," "Low-Sodium Nutrition Therapy," and "Sodium-Free Seasoning Tips."  Daughter is very appreciative and states that she was informed by other medical staff that she should keep sodium to </= 2000 mg/day.   Expect good compliance at home. Please re-consult if further nutrition-related issues arise.    Jarome Matin, RD, LDN Inpatient Clinical Dietitian Pager # [phone] After hours/weekend pager # [phone]

## 2015-08-28 NOTE — Progress Notes (Signed)
Please let us know when patient is discharged so we can arrange f/u. Valor [place] PA-C

## 2015-08-29 DIAGNOSIS — E876 Hypokalemia: Secondary | ICD-10-CM

## 2015-08-29 DIAGNOSIS — B9620 Unspecified Escherichia coli [E. coli] as the cause of diseases classified elsewhere: Secondary | ICD-10-CM

## 2015-08-29 LAB — BASIC METABOLIC PANEL
Anion gap: 10 (ref 5–15)
BUN: 31 mg/dL — AB (ref 6–20)
CHLORIDE: 106 mmol/L (ref 101–111)
CO2: 27 mmol/L (ref 22–32)
CREATININE: 1.81 mg/dL — AB (ref 0.44–1.00)
Calcium: 8.9 mg/dL (ref 8.9–10.3)
GFR calc Af Amer: 28 mL/min — ABNORMAL LOW (ref >60)
GFR, EST NON AFRICAN AMERICAN: 25 mL/min — AB (ref >60)
GLUCOSE: 95 mg/dL (ref 65–99)
POTASSIUM: 3.6 mmol/L (ref 3.5–5.1)
SODIUM: 143 mmol/L (ref 135–145)

## 2015-08-29 LAB — URINE CULTURE

## 2015-08-29 LAB — GLUCOSE, CAPILLARY
GLUCOSE-CAPILLARY: 87 mg/dL (ref 65–99)
GLUCOSE-CAPILLARY: 165 mg/dL — AB (ref 65–99)
Glucose-Capillary: 139 mg/dL — ABNORMAL HIGH (ref 65–99)
Glucose-Capillary: 116 mg/dL — ABNORMAL HIGH (ref 65–99)

## 2015-08-29 MED ORDER — FUROSEMIDE 10 MG/ML IJ SOLN
40.0000 mg | Freq: Once | INTRAMUSCULAR | Status: AC
  Administered 2015-08-29: 40 mg via INTRAVENOUS
  Filled 2015-08-29: qty 4

## 2015-08-29 NOTE — Progress Notes (Signed)
Patient ID: Anna [name], female   DOB: September 17, 1928, 82 y.o.   MRN: [id number] [facility] PROGRESS NOTE  Anna [name] [id number] DOB: [birth year] DOA: 08/26/2015 PCP: Haywood Pao, MD  Brief narrative:    82 year old female with a past medical history significant for CKD stage III, HTN, diabetes mellitus type 2, overactive bladder, history of atrial fibrillation but not on anticoagulation because of previous history of bleed, used to be on amiodarone but developed thyroid issues for which reason amiodarone has been stopped for past 6 months per patient's daughter. Pt presented to [facility] long hospital because of worsening weakness, shortness of breath with exertion.   On admission, patient was hemodynamically stable. Oxygen saturation was 95% on room air. BNP was 60. Chest x-ray showed cardiomegaly with mild pulmonary congestion and basilar atelectasis. Patient was however found to have many bacteria on urinalysis with moderate leukocytes for which reason she was started on empiric Rocephin. She was also given one dose of Lasix 40 mg IV in ED.  Assessment/Plan:    Principal Problem:   UTI (in the last lower urinary tract infection) due to E.Coli and Klebsiella  - Many bacteria on urinalysis. Urine culture growing E.Coli and Klebsiella - Continue Rocephin   Active Problems:   Generalized weakness - As previously noted, patient's overall physical condition including respiratory status are likely secondary to previously used amiodarone. The TSH on this admission is 0.011 and prior to that 0.018. It has been monitored on outpatient basis and while she is not on any particular treatment other than metoprolol which is for atrial fibrillation she is not on anything else. We'll touch base with [facility] endocrinologist and see if it makes sense to start any other medication for hyperthyroidism.    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  BNP 60 on the admission - 2-D echo on this admission showed ejection fraction of 45% with grade 1 diastolic dysfunction  - Per cardiology, patient to be on Lasix 60 mg daily but pt's daughter requested 1 dose of IV lasix 40 mg which we will give today but we have to be careful in monitoring renal function while she is on this high dose of lasix  - Continue daily weight and strict intake and output - Supplemented electrolytes as needed    Benign essential HTN - Continue Lasix and metoprolol     DM (diabetes mellitus) type II controlled with renal manifestation without long-term insulin use (HCC) - Continue sliding scale insulin.    CKD (chronic kidney disease), stage III - Creatinine is 1.8 today, at baseline  - Patient had creatinine as high as 1.88  - Monitor Cr while pt on lasix     Hyperthyroidism - Hypothyroidism issues are associated with previously used amiodarone which has been on hold for past 6 months  - TSH is 0.011; previously 0.018       Dyslipidemia associated with type 2 diabetes mellitus - Continue Pravachol 40 mg daily   DVT Prophylaxis  - Heparin subQ  Code Status: Full.  Family Communication:  plan of care discussed with the patient and her daughter at the bedside Disposition Plan: Home likely 08/31/2015  IV access:  Peripheral IV  Procedures and diagnostic studies:    Dg Chest 2 View 08/26/2015  Cardiomegaly with mild pulmonary vascular congestion and mild bibasilar atelectasis.   Medical Consultants:  Cardiology  Other Consultants:  PT  IAnti-Infectives:   Rocephin 08/26/2015 -->  [name], MD  [facility] Pager [phone]  Time spent in minutes: 25 minutes  If 7PM-7AM, please contact night-coverage [email or web address] Password [email or web address] 08/29/2015, 4:30 PM   LOS: 3 days    HPI/Subjective: No acute overnight events. Patient not in resp distress.   Objective: Filed Vitals:   08/28/15 2017 08/29/15 0622 08/29/15 0800 08/29/15 1408  BP:  134/68 148/50  131/52  Pulse: 57 59  54  Temp: 97 F (36.1 C) 97.7 F (36.5 C)  98.7 F (37.1 C)  TempSrc: Oral Oral  Oral  Resp: 18 18  18   Height:      Weight:   211 lb 10.3 oz (96 kg)   SpO2: 97% 98%  96%   No intake or output data in the 24 hours ending 08/29/15 1630  Exam:   General:  Pt is not in distress  Cardiovascular: Rate controlled, appreciate S1, S2   Respiratory: bilateral air entry, no wheezing   Abdomen:  (+) BS, non distended    Extremities: No edema, palpable pulses   Neuro: No focal deficits   Data Reviewed: Basic Metabolic Panel:  Recent Labs Lab 08/26/15 1403 08/27/15 0512 08/28/15 0915 08/29/15 0545  NA 140 143 141 143  K 3.9 3.2* 3.6 3.6  CL 104 105 105 106  CO2 27 26 27 27   GLUCOSE 211* 98 136* 95  BUN 23* 20 21* 31*  CREATININE 1.73* 1.51* 1.58* 1.81*  CALCIUM 9.2 9.0 9.0 8.9   Liver Function Tests: No results for input(s): AST, ALT, ALKPHOS, BILITOT, PROT, ALBUMIN in the last 168 hours. No results for input(s): LIPASE, AMYLASE in the last 168 hours. No results for input(s): AMMONIA in the last 168 hours. CBC:  Recent Labs Lab 08/26/15 1403 08/27/15 0512  WBC 5.5 5.7  HGB 12.3 11.7*  HCT 39.9 37.2  MCV 89.7 88.2  PLT 177 184   Cardiac Enzymes: No results for input(s): CKTOTAL, CKMB, CKMBINDEX, TROPONINI in the last 168 hours. BNP: Invalid input(s): POCBNP CBG:  Recent Labs Lab 08/28/15 1217 08/28/15 1609 08/28/15 2222 08/29/15 0755 08/29/15 1153  GLUCAP 152* 113* 119* 87 139*    Urine culture     Status: None   Collection Time: 08/26/15  6:48 PM  Result Value Ref Range Status   Specimen Description URINE, CLEAN CATCH  Final   Special Requests NONE  Final   Culture   Final   Report Status 08/29/2015 FINAL  Final   Organism ID, Bacteria ESCHERICHIA COLI  Final   Organism ID, Bacteria KLEBSIELLA PNEUMONIAE  Final      Susceptibility   Escherichia coli - MIC*    AMPICILLIN >=32 RESISTANT Resistant      CEFAZOLIN <=4 SENSITIVE Sensitive     CEFTRIAXONE <=1 SENSITIVE Sensitive     CIPROFLOXACIN <=0.25 SENSITIVE Sensitive     GENTAMICIN <=1 SENSITIVE Sensitive     IMIPENEM <=0.25 SENSITIVE Sensitive     NITROFURANTOIN <=16 SENSITIVE Sensitive     TRIMETH/SULFA >=320 RESISTANT Resistant     AMPICILLIN/SULBACTAM >=32 RESISTANT Resistant     PIP/TAZO 16 SENSITIVE Sensitive     * >=100,000 COLONIES/mL ESCHERICHIA COLI   Klebsiella pneumoniae - MIC*    AMPICILLIN >=32 RESISTANT Resistant     CEFAZOLIN <=4 SENSITIVE Sensitive     CEFTRIAXONE <=1 SENSITIVE Sensitive     CIPROFLOXACIN <=0.25 SENSITIVE Sensitive     GENTAMICIN <=1 SENSITIVE Sensitive     IMIPENEM <=0.25 SENSITIVE Sensitive     NITROFURANTOIN <=16 SENSITIVE Sensitive  TRIMETH/SULFA <=20 SENSITIVE Sensitive     AMPICILLIN/SULBACTAM 4 SENSITIVE Sensitive     PIP/TAZO <=4 SENSITIVE Sensitive     * >=100,000 COLONIES/mL KLEBSIELLA PNEUMONIAE     Scheduled Meds: . amLODipine  5 mg Oral Daily  . aspirin  325 mg Oral Daily  .  calcitRIOL  0.5 mcg Oral Mon Wed Fri  . cefTRIAXone   1 g Intravenous Q24H  . cyanocobalamin  500 mcg Oral Daily  . donepezil  10 mg Oral QHS  . fluticasone  2 spray Each Nare Daily  . furosemide  40 mg Oral Daily  . guaiFENesin  600 mg Oral BID  . heparin  5,000 Units Subcutaneous 3 times per day  . insulin aspart  0-9 Units Subcutaneous 6 times per day  . metoprolol succinate  12.5 mg Oral Daily  . potassium chloride SA  20 mEq Oral Daily  . pravastatin  40 mg Oral Daily

## 2015-08-30 LAB — BASIC METABOLIC PANEL
ANION GAP: 9 (ref 5–15)
BUN: 28 mg/dL — ABNORMAL HIGH (ref 6–20)
CHLORIDE: 105 mmol/L (ref 101–111)
CO2: 28 mmol/L (ref 22–32)
Calcium: 9.2 mg/dL (ref 8.9–10.3)
Creatinine, Ser: 1.7 mg/dL — ABNORMAL HIGH (ref 0.44–1.00)
GFR calc non Af Amer: 26 mL/min — ABNORMAL LOW (ref >60)
GFR, EST AFRICAN AMERICAN: 31 mL/min — AB (ref >60)
Glucose, Bld: 94 mg/dL (ref 65–99)
POTASSIUM: 3.8 mmol/L (ref 3.5–5.1)
SODIUM: 142 mmol/L (ref 135–145)

## 2015-08-30 LAB — GLUCOSE, CAPILLARY
GLUCOSE-CAPILLARY: 97 mg/dL (ref 65–99)
GLUCOSE-CAPILLARY: 141 mg/dL — AB (ref 65–99)
GLUCOSE-CAPILLARY: 143 mg/dL — AB (ref 65–99)
GLUCOSE-CAPILLARY: 138 mg/dL — AB (ref 65–99)

## 2015-08-30 NOTE — Progress Notes (Signed)
Patient ID: Anna [name], female   DOB: [birth year], 82 y.o.   MRN: [id number] [facility] PROGRESS NOTE  Anna [name] [id number] DOB: April 17, 1929 DOA: 08/26/2015 PCP: Haywood Pao, MD  Brief narrative:    82 year old female with a past medical history significant for CKD stage III, HTN, diabetes mellitus type 2, overactive bladder, history of atrial fibrillation but not on anticoagulation because of previous history of bleed, used to be on amiodarone but developed thyroid issues for which reason amiodarone has been stopped for past 6 months per patient's daughter. Pt presented to [facility] hospital because of worsening weakness, shortness of breath with exertion.   On admission, patient was hemodynamically stable. Oxygen saturation was 95% on room air. BNP was 60. Chest x-ray showed cardiomegaly with mild pulmonary congestion and basilar atelectasis. Patient was however found to have many bacteria on urinalysis with moderate leukocytes for which reason she was started on empiric Rocephin. She was also given one dose of Lasix 40 mg IV in ED.  Assessment/Plan:    Principal Problem:   UTI (in the last lower urinary tract infection) due to E.Coli and Klebsiella  - Urine culture growing E.Coli and Klebsiella - Continue Rocephin   Active Problems:   Generalized weakness - As previously noted, patient's overall physical condition including respiratory status are likely secondary to previously used amiodarone. The TSH on this admission is 0.011 and prior to that 0.018. It has been monitored on outpatient basis and while she is not on any particular treatment other than metoprolol which is for atrial fibrillation she is not on anything else. We'll touch base with [facility] endocrinologist and see if it makes sense to start any other medication for hyperthyroidism.    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 BNP 60 on the admission - 2-D  echo showed ejection fraction of 45% with grade 1 diastolic dysfunction  - Per cardiology, patient to be on Lasix 60 mg daily  - Weight in past 48 hours: 211 lbs --> 211 lbs --> 208 lbs  - Continue daily weight and strict intake and output - Supplement electrolytes as needed    Benign essential HTN - Continue Lasix and metoprolol     DM (diabetes mellitus) type II controlled with renal manifestation without long-term insulin use (HCC) - Continue sliding scale insulin. - CBG's in past 24 hours: 165, 97, 141    CKD (chronic kidney disease), stage III - Patient had creatinine as high as 1.88  - Cr at baseline     Hyperthyroidism - Hypothyroidism issues are associated with previously used amiodarone which has been on hold for past 6 months  - TSH is 0.011; previously 0.018       Dyslipidemia associated with type 2 diabetes mellitus - Continue Pravachol 40 mg daily   DVT Prophylaxis  - Heparin subQ in hospital   Code Status: Full.  Family Communication:  plan of care discussed with the patient and her daughter at the bedside Disposition Plan: Home likely 08/31/2015  IV access:  Peripheral IV  Procedures and diagnostic studies:    Dg Chest 2 View 08/26/2015  Cardiomegaly with mild pulmonary vascular congestion and mild bibasilar atelectasis.   Medical Consultants:  Cardiology  Other Consultants:  PT  IAnti-Infectives:   Rocephin 08/26/2015 -->    [name], MD  [facility] Pager [phone]  Time spent in minutes: 15 minutes  If 7PM-7AM, please contact night-coverage [email or web address] Password [email or web address]  08/30/2015, 12:58 PM   LOS: 4 days    HPI/Subjective: No acute overnight events. Patient feels better.   Objective: Filed Vitals:   08/29/15 0800 08/29/15 1408 08/29/15 2020 08/30/15 0619  BP:  131/52 141/56 141/47  Pulse:  54 57 61  Temp:  98.7 F (37.1 C) 98.4 F (36.9 C) 97.8 F (36.6 C)  TempSrc:  Oral Oral Oral  Resp:  18 18 18   Height:      Weight:  211 lb 10.3 oz (96 kg)   208 lb 1.8 oz (94.4 kg)  SpO2:  96% 98% 95%   No intake or output data in the 24 hours ending 08/30/15 1258  Exam:   General:  Pt is alert, awake  Cardiovascular: RRR, appreciate S1, S2   Respiratory: clear to auscultation bilaterally, no wheezing   Abdomen:  Non tender, (+) BS  Extremities: No leg swelling, palpable pulses    Neuro: Nonfocal    Data Reviewed: Basic Metabolic Panel:  Recent Labs Lab 08/26/15 1403 08/27/15 0512 08/28/15 0915 08/29/15 0545 08/30/15 0536  NA 140 143 141 143 142  K 3.9 3.2* 3.6 3.6 3.8  CL 104 105 105 106 105  CO2 27 26 27 27 28   GLUCOSE 211* 98 136* 95 94  BUN 23* 20 21* 31* 28*  CREATININE 1.73* 1.51* 1.58* 1.81* 1.70*  CALCIUM 9.2 9.0 9.0 8.9 9.2   Liver Function Tests: No results for input(s): AST, ALT, ALKPHOS, BILITOT, PROT, ALBUMIN in the last 168 hours. No results for input(s): LIPASE, AMYLASE in the last 168 hours. No results for input(s): AMMONIA in the last 168 hours. CBC:  Recent Labs Lab 08/26/15 1403 08/27/15 0512  WBC 5.5 5.7  HGB 12.3 11.7*  HCT 39.9 37.2  MCV 89.7 88.2  PLT 177 184   Cardiac Enzymes: No results for input(s): CKTOTAL, CKMB, CKMBINDEX, TROPONINI in the last 168 hours. BNP: Invalid input(s): POCBNP CBG:  Recent Labs Lab 08/29/15 1153 08/29/15 1636 08/29/15 2110 08/30/15 0725 08/30/15 1153  GLUCAP 139* 116* 165* 97 141*    Urine culture     Status: None   Collection Time: 08/26/15  6:48 PM  Result Value Ref Range Status   Specimen Description URINE, CLEAN CATCH  Final   Special Requests NONE  Final   Culture   Final   Report Status 08/29/2015 FINAL  Final   Organism ID, Bacteria ESCHERICHIA COLI  Final   Organism ID, Bacteria KLEBSIELLA PNEUMONIAE  Final      Susceptibility   Escherichia coli - MIC*    AMPICILLIN >=32 RESISTANT Resistant     CEFAZOLIN <=4 SENSITIVE Sensitive     CEFTRIAXONE <=1 SENSITIVE Sensitive     CIPROFLOXACIN <=0.25 SENSITIVE  Sensitive     GENTAMICIN <=1 SENSITIVE Sensitive     IMIPENEM <=0.25 SENSITIVE Sensitive     NITROFURANTOIN <=16 SENSITIVE Sensitive     TRIMETH/SULFA >=320 RESISTANT Resistant     AMPICILLIN/SULBACTAM >=32 RESISTANT Resistant     PIP/TAZO 16 SENSITIVE Sensitive     * >=100,000 COLONIES/mL ESCHERICHIA COLI   Klebsiella pneumoniae - MIC*    AMPICILLIN >=32 RESISTANT Resistant     CEFAZOLIN <=4 SENSITIVE Sensitive     CEFTRIAXONE <=1 SENSITIVE Sensitive     CIPROFLOXACIN <=0.25 SENSITIVE Sensitive     GENTAMICIN <=1 SENSITIVE Sensitive     IMIPENEM <=0.25 SENSITIVE Sensitive     NITROFURANTOIN <=16 SENSITIVE Sensitive     TRIMETH/SULFA <=20 SENSITIVE Sensitive     AMPICILLIN/SULBACTAM  4 SENSITIVE Sensitive     PIP/TAZO <=4 SENSITIVE Sensitive     * >=100,000 COLONIES/mL KLEBSIELLA PNEUMONIAE     Scheduled Meds: . amLODipine  5 mg Oral Daily  . aspirin  325 mg Oral Daily  .  calcitRIOL  0.5 mcg Oral Mon Wed Fri  . cefTRIAXone   1 g Intravenous Q24H  . cyanocobalamin  500 mcg Oral Daily  . donepezil  10 mg Oral QHS  . fluticasone  2 spray Each Nare Daily  . furosemide  40 mg Oral Daily  . guaiFENesin  600 mg Oral BID  . heparin  5,000 Units Subcutaneous 3 times per day  . insulin aspart  0-9 Units Subcutaneous 6 times per day  . metoprolol succinate  12.5 mg Oral Daily  . potassium chloride SA  20 mEq Oral Daily  . pravastatin  40 mg Oral Daily

## 2015-08-31 DIAGNOSIS — E1121 Type 2 diabetes mellitus with diabetic nephropathy: Secondary | ICD-10-CM

## 2015-08-31 LAB — GLUCOSE, CAPILLARY
GLUCOSE-CAPILLARY: 202 mg/dL — AB (ref 65–99)
Glucose-Capillary: 122 mg/dL — ABNORMAL HIGH (ref 65–99)
Glucose-Capillary: 183 mg/dL — ABNORMAL HIGH (ref 65–99)
Glucose-Capillary: 113 mg/dL — ABNORMAL HIGH (ref 65–99)

## 2015-08-31 MED ORDER — CIPROFLOXACIN HCL 500 MG PO TABS: 500.0000 mg | ORAL_TABLET | Freq: Two times a day (BID) | ORAL | Status: DC

## 2015-08-31 MED ORDER — FUROSEMIDE 20 MG PO TABS: 60.0000 mg | ORAL_TABLET | Freq: Every day | ORAL | Status: DC

## 2015-08-31 NOTE — Discharge Instructions (Signed)
Urinary Tract Infection Urinary tract infections (UTIs) can develop anywhere along your urinary tract. Your urinary tract is your body's drainage system for removing wastes and extra water. Your urinary tract includes two kidneys, two ureters, a bladder, and a urethra. Your kidneys are a pair of bean-shaped organs. Each kidney is about the size of your fist. They are located below your ribs, one on each side of your spine. CAUSES Infections are caused by microbes, which are microscopic organisms, including fungi, viruses, and bacteria. These organisms are so small that they can only be seen through a microscope. Bacteria are the microbes that most commonly cause UTIs. SYMPTOMS  Symptoms of UTIs may vary by age and gender of the patient and by the location of the infection. Symptoms in young women typically include a frequent and intense urge to urinate and a painful, burning feeling in the bladder or urethra during urination. Older women and men are more likely to be tired, shaky, and weak and have muscle aches and abdominal pain. A fever may mean the infection is in your kidneys. Other symptoms of a kidney infection include pain in your back or sides below the ribs, nausea, and vomiting. DIAGNOSIS To diagnose a UTI, your caregiver will ask you about your symptoms. Your caregiver will also ask you to provide a urine sample. The urine sample will be tested for bacteria and white blood cells. White blood cells are made by your body to help fight infection. TREATMENT  Typically, UTIs can be treated with medication. Because most UTIs are caused by a bacterial infection, they usually can be treated with the use of antibiotics. The choice of antibiotic and length of treatment depend on your symptoms and the type of bacteria causing your infection. HOME CARE INSTRUCTIONS  If you were prescribed antibiotics, take them exactly as your caregiver instructs you. Finish the medication even if you feel better after  you have only taken some of the medication.  Drink enough water and fluids to keep your urine clear or pale yellow.  Avoid caffeine, tea, and carbonated beverages. They tend to irritate your bladder.  Empty your bladder often. Avoid holding urine for long periods of time.  Empty your bladder before and after sexual intercourse.  After a bowel movement, women should cleanse from front to back. Use each tissue only once. SEEK MEDICAL CARE IF:   You have back pain.  You develop a fever.  Your symptoms do not begin to resolve within 3 days. SEEK IMMEDIATE MEDICAL CARE IF:   You have severe back pain or lower abdominal pain.  You develop chills.  You have nausea or vomiting.  You have continued burning or discomfort with urination. MAKE SURE YOU:   Understand these instructions.  Will watch your condition.  Will get help right away if you are not doing well or get worse.   This information is not intended to replace advice given to you by your health care provider. Make sure you discuss any questions you have with your health care provider.   Document Released: 05/18/2005 Document Revised: 04/29/2015 Document Reviewed: 09/16/2011 Elsevier Interactive Patient Education 2016 [employer] Ciprofloxacin tablets What is this medicine? CIPROFLOXACIN (sip roe FLOX a sin) is a quinolone antibiotic. It is used to treat certain kinds of bacterial infections. It will not work for colds, flu, or other viral infections. This medicine may be used for other purposes; ask your health care provider or pharmacist if you have questions. What should I tell my  health care provider before I take this medicine? They need to know if you have any of these conditions: -bone problems -cerebral disease -history of low levels of potassium in the blood -joint problems -irregular heartbeat -kidney disease -myasthenia gravis -seizures -tendon problems -tingling of the fingers or toes, or other  nerve disorder -an unusual or allergic reaction to ciprofloxacin, other antibiotics or medicines, foods, dyes, or preservatives -pregnant or trying to get pregnant -breast-feeding How should I use this medicine? Take this medicine by mouth with a glass of water. Follow the directions on the prescription label. Take your medicine at regular intervals. Do not take your medicine more often than directed. Take all of your medicine as directed even if you think your are better. Do not skip doses or stop your medicine early. You can take this medicine with food or on an empty stomach. It can be taken with a meal that contains dairy or calcium, but do not take it alone with a dairy product, like milk or yogurt or calcium-fortified juice. A special MedGuide will be given to you by the pharmacist with each prescription and refill. Be sure to read this information carefully each time. Talk to your pediatrician regarding the use of this medicine in children. Special care may be needed. Overdosage: If you think you have taken too much of this medicine contact a poison control center or emergency room at once. NOTE: This medicine is only for you. Do not share this medicine with others. What if I miss a dose? If you miss a dose, take it as soon as you can. If it is almost time for your next dose, take only that dose. Do not take double or extra doses. What may interact with this medicine? Do not take this medicine with any of the following medications: -cisapride -droperidol -terfenadine -tizanidine This medicine may also interact with the following medications: -antacids -birth control pills -caffeine -cyclosporin -didanosine (ddI) buffered tablets or powder -medicines for diabetes -medicines for inflammation like ibuprofen, naproxen -methotrexate -multivitamins -omeprazole -phenytoin -probenecid -sucralfate -theophylline -warfarin This list may not describe all possible interactions. Give your  health care provider a list of all the medicines, herbs, non-prescription drugs, or dietary supplements you use. Also tell them if you smoke, drink alcohol, or use illegal drugs. Some items may interact with your medicine. What should I watch for while using this medicine? Tell your doctor or health care professional if your symptoms do not improve. Do not treat diarrhea with over the counter products. Contact your doctor if you have diarrhea that lasts more than 2 days or if it is severe and watery. You may get drowsy or dizzy. Do not drive, use machinery, or do anything that needs mental alertness until you know how this medicine affects you. Do not stand or sit up quickly, especially if you are an older patient. This reduces the risk of dizzy or fainting spells. This medicine can make you more sensitive to the sun. Keep out of the sun. If you cannot avoid being in the sun, wear protective clothing and use sunscreen. Do not use sun lamps or tanning beds/booths. Avoid antacids, aluminum, calcium, iron, magnesium, and zinc products for 6 hours before and 2 hours after taking a dose of this medicine. What side effects may I notice from receiving this medicine? Side effects that you should report to your doctor or health care professional as soon as possible: -allergic reactions like skin rash or hives, swelling of the face, lips, or  tongue -anxious -confusion -depressed mood -diarrhea -fast, irregular heartbeat -hallucination, loss of contact with reality -joint, muscle, or tendon pain or swelling -pain, tingling, numbness in the hands or feet -suicidal thoughts or other mood changes -sunburn -unusually weak or tired Side effects that usually do not require medical attention (report to your doctor or health care professional if they continue or are bothersome): -dry mouth -headache -nausea -trouble sleeping This list may not describe all possible side effects. Call your doctor for medical  advice about side effects. You may report side effects to FDA at 1-800-FDA-1088. Where should I keep my medicine? Keep out of the reach of children. Store at room temperature below 30 degrees C (86 degrees F). Keep container tightly closed. Throw away any unused medicine after the expiration date. NOTE: This sheet is a summary. It may not cover all possible information. If you have questions about this medicine, talk to your doctor, pharmacist, or health care provider.    2016, [employer] Standard. (2015-03-19 12:57:02) Furosemide tablets What is this medicine? FUROSEMIDE (fyoor [place] se mide) is a diuretic. It helps you make more urine and to lose salt and excess water from your body. This medicine is used to treat high blood pressure, and edema or swelling from heart, kidney, or liver disease. This medicine may be used for other purposes; ask your health care provider or pharmacist if you have questions. What should I tell my health care provider before I take this medicine? They need to know if you have any of these conditions: -abnormal blood electrolytes -diarrhea or vomiting -gout -heart disease -kidney disease, small amounts of urine, or difficulty passing urine -liver disease -thyroid disease -an unusual or allergic reaction to furosemide, sulfa drugs, other medicines, foods, dyes, or preservatives -pregnant or trying to get pregnant -breast-feeding How should I use this medicine? Take this medicine by mouth with a glass of water. Follow the directions on the prescription label. You may take this medicine with or without food. If it upsets your stomach, take it with food or milk. Do not take your medicine more often than directed. Remember that you will need to pass more urine after taking this medicine. Do not take your medicine at a time of day that will cause you problems. Do not take at bedtime. Talk to your pediatrician regarding the use of this medicine in children. While this  drug may be prescribed for selected conditions, precautions do apply. Overdosage: If you think you have taken too much of this medicine contact a poison control center or emergency room at once. NOTE: This medicine is only for you. Do not share this medicine with others. What if I miss a dose? If you miss a dose, take it as soon as you can. If it is almost time for your next dose, take only that dose. Do not take double or extra doses. What may interact with this medicine? -aspirin and aspirin-like medicines -certain antibiotics -chloral hydrate -cisplatin -cyclosporine -digoxin -diuretics -laxatives -lithium -medicines for blood pressure -medicines that relax muscles for surgery -methotrexate -NSAIDs, medicines for pain and inflammation like ibuprofen, naproxen, or indomethacin -phenytoin -steroid medicines like prednisone or cortisone -sucralfate -thyroid hormones This list may not describe all possible interactions. Give your health care provider a list of all the medicines, herbs, non-prescription drugs, or dietary supplements you use. Also tell them if you smoke, drink alcohol, or use illegal drugs. Some items may interact with your medicine. What should I watch for while using this medicine?  Visit your doctor or health care professional for regular checks on your progress. Check your blood pressure regularly. Ask your doctor or health care professional what your blood pressure should be, and when you should contact him or her. If you are a diabetic, check your blood sugar as directed. You may need to be on a special diet while taking this medicine. Check with your doctor. Also, ask how many glasses of fluid you need to drink a day. You must not get dehydrated. You may get drowsy or dizzy. Do not drive, use machinery, or do anything that needs mental alertness until you know how this drug affects you. Do not stand or sit up quickly, especially if you are an older patient. This reduces  the risk of dizzy or fainting spells. Alcohol can make you more drowsy and dizzy. Avoid alcoholic drinks. This medicine can make you more sensitive to the sun. Keep out of the sun. If you cannot avoid being in the sun, wear protective clothing and use sunscreen. Do not use sun lamps or tanning beds/booths. What side effects may I notice from receiving this medicine? Side effects that you should report to your doctor or health care professional as soon as possible: -blood in urine or stools -dry mouth -fever or chills -hearing loss or ringing in the ears -irregular heartbeat -muscle pain or weakness, cramps -skin rash -stomach upset, pain, or nausea -tingling or numbness in the hands or feet -unusually weak or tired -vomiting or diarrhea -yellowing of the eyes or skin Side effects that usually do not require medical attention (report to your doctor or health care professional if they continue or are bothersome): -headache -loss of appetite -unusual bleeding or bruising This list may not describe all possible side effects. Call your doctor for medical advice about side effects. You may report side effects to FDA at 1-800-FDA-1088. Where should I keep my medicine? Keep out of the reach of children. Store at room temperature between 15 and 30 degrees C (59 and 86 degrees F). Protect from light. Throw away any unused medicine after the expiration date. NOTE: This sheet is a summary. It may not cover all possible information. If you have questions about this medicine, talk to your doctor, pharmacist, or health care provider.    2016, [employer] Standard. (2014-10-29 13:49:50)

## 2015-08-31 NOTE — Discharge Summary (Signed)
Physician Discharge Summary  Anna [name] [id number] DOB: 17-Mar-1929 DOA: 08/26/2015  PCP: Haywood Pao, MD  Admit date: 08/26/2015 Discharge date: 09/01/2015  Recommendations for Outpatient Follow-up:  Take lasix 60 mg daily Take potassium 20 meq daily Take cipro 500 mg twice a day for 7 days for Klebsiella and E.Coli UTI F/U with endocrinology to recheck TSH level  Discharge Diagnoses:  Principal Problem:   UTI (lower urinary tract infection) Active Problems:   Dyspnea   Benign essential HTN   DM (diabetes mellitus) type II controlled with renal manifestation (HCC)   CKD (chronic kidney disease), stage III   Hyperthyroidism   Acute combined systolic and diastolic heart failure (HCC)   Generalized weakness   Hypokalemia    Discharge Condition: stable   Diet recommendation: as tolerated   History of present illness:  82 year old female with a past medical history significant for CKD stage III, HTN, diabetes mellitus type 2, overactive bladder, history of atrial fibrillation but not on anticoagulation because of previous history of bleed, used to be on amiodarone but developed thyroid issues for which reason amiodarone has been stopped for past 6 months per patient's daughter. Pt presented to [facility] hospital because of worsening weakness, shortness of breath with exertion.   On admission, patient was hemodynamically stable. Oxygen saturation was 95% on room air. BNP was 60. Chest x-ray showed cardiomegaly with mild pulmonary congestion and basilar atelectasis. Patient was however found to have many bacteria on urinalysis with moderate leukocytes for which reason she was started on empiric Rocephin. She was also given one dose of Lasix 40 mg IV in ED.  During this hospital stay she was found to have E.Coli and Klebsiella UTI and she was treated appropriately along with abx prescribed on discharge. Cardio has also adjusted her lasix dose to 60 mg daily.  Hospital  Course:   Assessment/Plan:    Principal Problem:  UTI (in the last lower urinary tract infection) due to E.Coli and Klebsiella  - Urine culture growing E.Coli and Klebsiella - Continue Rocephin through 1/10 - Continue Cipro on discharge for 7 days  Active Problems:  Generalized weakness - As previously noted, patient's overall physical condition including respiratory status are likely secondary to previously used amiodarone. The TSH on this admission is 0.011 and prior to that 0.018. It has been monitored on outpatient basis  - Number for [facility] endo provided and daughter will sch appt   Chronic systolic and diastolic heart failure (HCC) - Mild cardiomegaly seen on chest x-ray. BNP 60 on the admission - 2-D echo showed ejection fraction of 45% with grade 1 diastolic dysfunction  - Per cardiology, patient to be on Lasix 60 mg daily  - Weight in past 48 hours: 211 lbs --> 211 lbs --> 208 lbs --> 208 lbs   Benign essential HTN - Continue Lasix and metoprolol on discharge    DM (diabetes mellitus) type II controlled with renal manifestation without long-term insulin use (HCC) - Continue glimepiride on discharge    CKD (chronic kidney disease), stage III - Patient had creatinine as high as 1.88  - Cr at baseline    Hyperthyroidism - Hypothyroidism issues are associated with previously used amiodarone which has been on hold for past 6 months  - TSH is 0.011; previously 0.018     Dyslipidemia associated with type 2 diabetes mellitus - Continue Pravachol 40 mg daily on discharge    DVT Prophylaxis  - Heparin subQ in hospital   Code  Status: Full.  Family Communication: plan of care discussed with the patient and her daughter at the bedside  IV access:  Peripheral IV  Procedures and diagnostic studies:   Dg Chest 2 View 08/26/2015 Cardiomegaly with mild pulmonary vascular congestion and mild bibasilar atelectasis.   Medical Consultants:   Cardiology  Other Consultants:  PT  IAnti-Infectives:   Rocephin 08/26/2015 --> 09/01/2015 Cipro for 7 days on discharge    Signed:  [name], MD  [facility] 08/31/2015, 12:41 PM  Pager #: [phone]  Time spent in minutes: more than 30 minutes  Discharge Exam: Filed Vitals:   08/30/15 2233 08/31/15 0609  BP: 136/50 146/48  Pulse: 60 66  Temp: 98.1 F (36.7 C) 98.5 F (36.9 C)  Resp: 20 20   Filed Vitals:   08/30/15 1415 08/30/15 1417 08/30/15 2233 08/31/15 0609  BP: 111/82 159/46 136/50 146/48  Pulse: 58  60 66  Temp: 97.5 F (36.4 C)  98.1 F (36.7 C) 98.5 F (36.9 C)  TempSrc: Oral  Oral Oral  Resp: 18  20 20   Height:      Weight:    208 lb 1.8 oz (94.4 kg)  SpO2: 99%  95% 97%    General: Pt is alert, follows commands appropriately, not in acute distress Cardiovascular: Regular rate and rhythm, S1/S2 appreciated  Respiratory: Clear to auscultation bilaterally, no wheezing, no crackles, no rhonchi Abdominal: Soft, non tender, non distended, bowel sounds +, no guarding Extremities: no edema, no cyanosis, pulses palpable bilaterally DP and PT Neuro: Grossly nonfocal  Discharge Instructions  Discharge Instructions    Call MD for:  difficulty breathing, headache or visual disturbances    Complete by:  As directed      Call MD for:  persistant dizziness or light-headedness    Complete by:  As directed      Call MD for:  persistant nausea and vomiting    Complete by:  As directed      Call MD for:  severe uncontrolled pain    Complete by:  As directed      Diet - low sodium heart healthy    Complete by:  As directed      Discharge instructions    Complete by:  As directed        Increase activity slowly    Complete by:  As directed             Medication List    TAKE these medications        acetaminophen 500 MG tablet  Commonly known as:  TYLENOL  Take 500 mg by mouth every 6 (six) hours as needed for fever.     ALAVERT  10 MG tablet  Generic drug:  loratadine  Take 10 mg by mouth every other day as needed for allergies.     amLODipine 5 MG tablet  Commonly known as:  NORVASC  Take 1 tablet by mouth daily.     aspirin 325 MG tablet  Take 325 mg by mouth daily.     calcitRIOL 0.5 MCG capsule  Commonly known as:  ROCALTROL  Take 0.5 mcg by mouth 3 (three) times a week. Monday, Wednesday, and Friday only     ciprofloxacin 500 MG tablet  Commonly known as:  CIPRO  Take 1 tablet (500 mg total) by mouth 2 (two) times daily.     docusate sodium 100 MG capsule  Commonly known as:  COLACE  Take 100 mg by mouth daily  as needed for mild constipation.     donepezil 10 MG tablet  Commonly known as:  ARICEPT  Take 10 mg by mouth at bedtime.     fluticasone 50 MCG/ACT nasal spray  Commonly known as:  FLONASE  Place 2 sprays into both nostrils daily.     furosemide 20 MG tablet  Commonly known as:  LASIX  Take 3 tablets (60 mg total) by mouth daily.     glimepiride 4 MG tablet  Commonly known as:  AMARYL  Take 4 mg by mouth daily with breakfast.     guaiFENesin 600 MG 12 hr tablet  Commonly known as:  MUCINEX  Take 600 mg by mouth 2 (two) times daily as needed for cough.     metoprolol succinate 25 MG 24 hr tablet  Commonly known as:  TOPROL-XL  Take 12.5 mg by mouth daily.     mirabegron ER 50 MG Tb24 tablet  Commonly known as:  MYRBETRIQ  Take 50 mg by mouth daily.     potassium chloride SA 20 MEQ tablet  Commonly known as:  K-DUR,KLOR-CON  Take 20 mEq by mouth daily.     pravastatin 40 MG tablet  Commonly known as:  PRAVACHOL  Take 40 mg by mouth daily.     SYSTANE 0.4-0.3 % Soln  Generic drug:  Polyethyl Glycol-Propyl Glycol  Apply 1 drop to eye 2 (two) times daily as needed (dry eyes).     vitamin B-12 500 MCG tablet  Commonly known as:  CYANOCOBALAMIN  Take 500 mcg by mouth daily.     Vitamin D (Ergocalciferol) 50000 units Caps capsule  Commonly known as:  DRISDOL  Take  50,000 Units by mouth once a week. Mondays           Follow-up Information    Follow up with Haywood Pao, MD. Schedule an appointment as soon as possible for a visit in 1 week.   Specialty:  Internal Medicine   Why:  Follow up appt after recent hospitalization   Contact information:   [place] [address] [phone]       Schedule an appointment as soon as possible for a visit with [facility].   Specialty:  Internal Medicine   Why:  for TSH level check   Contact information:   [address], [place] [phone]       The results of significant diagnostics from this hospitalization (including imaging, microbiology, ancillary and laboratory) are listed below for reference.    Significant Diagnostic Studies: Dg Chest 2 View  08/26/2015  CLINICAL DATA:  Increasing shortness of breath and cough for 3 days. Coughing congestion for 2 months. History of CHF and atrial fibrillation. EXAM: CHEST  2 VIEW COMPARISON:  01/27/2015 FINDINGS: Cardiac silhouette remains enlarged. Left hemidiaphragm remains mildly elevated with left basilar subsegmental atelectasis. Minimal right basilar atelectasis is also noted. There is mild pulmonary vascular congestion without evidence of overt edema. No segmental airspace consolidation, pleural effusion, or pneumothorax is identified. No acute osseous abnormality is identified. Aortic calcification is noted. IMPRESSION: Cardiomegaly with mild pulmonary vascular congestion and mild bibasilar atelectasis. Electronically Signed   By: Logan Bores M.D.   On: 08/26/2015 14:11    Microbiology: Recent Results (from the past 240 hour(s))  Urine culture     Status: None   Collection Time: 08/26/15  6:48 PM  Result Value Ref Range Status   Specimen Description URINE, CLEAN CATCH  Final   Special Requests NONE  Final   Culture   Final    >=100,000 COLONIES/mL ESCHERICHIA  COLI >=100,000 COLONIES/mL KLEBSIELLA PNEUMONIAE Performed at [facility]    Report Status 08/29/2015 FINAL  Final   Organism ID, Bacteria ESCHERICHIA COLI  Final   Organism ID, Bacteria KLEBSIELLA PNEUMONIAE  Final      Susceptibility   Escherichia coli - MIC*    AMPICILLIN >=32 RESISTANT Resistant     CEFAZOLIN <=4 SENSITIVE Sensitive     CEFTRIAXONE <=1 SENSITIVE Sensitive     CIPROFLOXACIN <=0.25 SENSITIVE Sensitive     GENTAMICIN <=1 SENSITIVE Sensitive     IMIPENEM <=0.25 SENSITIVE Sensitive     NITROFURANTOIN <=16 SENSITIVE Sensitive     TRIMETH/SULFA >=320 RESISTANT Resistant     AMPICILLIN/SULBACTAM >=32 RESISTANT Resistant     PIP/TAZO 16 SENSITIVE Sensitive     * >=100,000 COLONIES/mL ESCHERICHIA COLI   Klebsiella pneumoniae - MIC*    AMPICILLIN >=32 RESISTANT Resistant     CEFAZOLIN <=4 SENSITIVE Sensitive     CEFTRIAXONE <=1 SENSITIVE Sensitive     CIPROFLOXACIN <=0.25 SENSITIVE Sensitive     GENTAMICIN <=1 SENSITIVE Sensitive     IMIPENEM <=0.25 SENSITIVE Sensitive     NITROFURANTOIN <=16 SENSITIVE Sensitive     TRIMETH/SULFA <=20 SENSITIVE Sensitive     AMPICILLIN/SULBACTAM 4 SENSITIVE Sensitive     PIP/TAZO <=4 SENSITIVE Sensitive     * >=100,000 COLONIES/mL KLEBSIELLA PNEUMONIAE     Labs: Basic Metabolic Panel:  Recent Labs Lab 08/26/15 1403 08/27/15 0512 08/28/15 0915 08/29/15 0545 08/30/15 0536  NA 140 143 141 143 142  K 3.9 3.2* 3.6 3.6 3.8  CL 104 105 105 106 105  CO2 27 26 27 27 28   GLUCOSE 211* 98 136* 95 94  BUN 23* 20 21* 31* 28*  CREATININE 1.73* 1.51* 1.58* 1.81* 1.70*  CALCIUM 9.2 9.0 9.0 8.9 9.2   Liver Function Tests: No results for input(s): AST, ALT, ALKPHOS, BILITOT, PROT, ALBUMIN in the last 168 hours. No results for input(s): LIPASE, AMYLASE in the last 168 hours. No results for input(s): AMMONIA in the last 168 hours. CBC:  Recent Labs Lab 08/26/15 1403 08/27/15 0512  WBC 5.5 5.7  HGB 12.3 11.7*  HCT 39.9  37.2  MCV 89.7 88.2  PLT 177 184   Cardiac Enzymes: No results for input(s): CKTOTAL, CKMB, CKMBINDEX, TROPONINI in the last 168 hours. BNP: BNP (last 3 results)  Recent Labs  01/24/15 1545 01/27/15 1020 08/26/15 1808  BNP 71.4 26.4 60.7    ProBNP (last 3 results) No results for input(s): PROBNP in the last 8760 hours.  CBG:  Recent Labs Lab 08/30/15 1153 08/30/15 1637 08/30/15 2237 08/31/15 0801 08/31/15 1156  GLUCAP 141* 143* 138* 122* 183*

## 2015-08-31 NOTE — Care Management Note (Signed)
Case Management Note  Patient Details  Name: Anna [name] MRN: [id number] Date of Birth: March 12, 1929  Subjective/Objective:   Family declines SNF. Has family support-daughter, 24hr asst. From home.                 Action/Plan:d/c plan home.   Expected Discharge Date:   (unknown)               Expected Discharge Plan:  Home/Self Care  In-House Referral:     Discharge planning Services  CM Consult  Post Acute Care Choice:    Choice offered to:     DME Arranged:    DME Agency:     HH Arranged:    HH Agency:     Status of Service:  In process, will continue to follow  Medicare Important Message Given:    Date Medicare IM Given:    Medicare IM give by:    Date Additional Medicare IM Given:    Additional Medicare Important Message give by:     If discussed at [place] of Stay Meetings, dates discussed:    Additional Comments:  Dessa Phi, RN 08/31/2015, 4:42 PM

## 2015-09-01 LAB — GLUCOSE, CAPILLARY: GLUCOSE-CAPILLARY: 113 mg/dL — AB (ref 65–99)

## 2015-09-01 NOTE — Progress Notes (Signed)
Patient seen and examined at the bedside Patient is medically stable for discharge home today Please refer to discharge summary completed 08/31/2015. No changes in medical management since 08/31/2015  [name] [facility] [id number]

## 2015-09-01 NOTE — Care Management Important Message (Signed)
Important Message  Patient Details  Name: Anna [name] MRN: [id number] Date of Birth: 12-Nov-1928   Medicare Important Message Given:  Yes    Camillo Flaming 09/01/2015, 10:37 AMImportant Message  Patient Details  Name: Anna [name] MRN: [id number] Date of Birth: 05-Jan-1929   Medicare Important Message Given:  Yes    Camillo Flaming 09/01/2015, 10:36 AM

## 2015-09-01 NOTE — Care Management Note (Signed)
Case Management Note  Patient Details  Name: Anna [name] MRN: [id number] Date of Birth: 06-Apr-1929  Subjective/Objective:                    Action/Plan:d/c snf.   Expected Discharge Date:   (unknown)               Expected Discharge Plan:  [place]  In-House Referral:     Discharge planning Services  CM Consult  Post Acute Care Choice:    Choice offered to:     DME Arranged:    DME Agency:     HH Arranged:    [place] Agency:     Status of Service:  Completed, signed off  Medicare Important Message Given:  Yes Date Medicare IM Given:    Medicare IM give by:    Date Additional Medicare IM Given:    Additional Medicare Important Message give by:     If discussed at [place] of Stay Meetings, dates discussed:    Additional Comments:  Dessa Phi, RN 09/01/2015, 11:50 AM

## 2015-09-09 DIAGNOSIS — Z6833 Body mass index (BMI) 33.0-33.9, adult: Secondary | ICD-10-CM | POA: Diagnosis not present

## 2015-09-09 DIAGNOSIS — N3281 Overactive bladder: Secondary | ICD-10-CM | POA: Diagnosis not present

## 2015-09-09 DIAGNOSIS — N184 Chronic kidney disease, stage 4 (severe): Secondary | ICD-10-CM | POA: Diagnosis not present

## 2015-09-09 DIAGNOSIS — I480 Paroxysmal atrial fibrillation: Secondary | ICD-10-CM | POA: Diagnosis not present

## 2015-09-16 ENCOUNTER — Telehealth (HOSPITAL_COMMUNITY): Payer: Self-pay | Admitting: Nurse Practitioner

## 2015-09-16 NOTE — Telephone Encounter (Signed)
Daughter called and said that mother's heart rate was in the upper 40's this pm. A little dizzy at times, does not know her BP. HR was ok this am, (in 50's, her norm) before taking metoprolol. Watch heart rate this pm and in am. If stays low will have to hold metoprolol in the am or reduce to 6.25 mg daily. If persists will need 48 hour monitor. Daughter agreed to plan and will call back if has continuing issues. Encouraged to get a BP cuff at home.

## 2015-09-17 ENCOUNTER — Encounter: Payer: Self-pay | Admitting: Endocrinology

## 2015-09-17 ENCOUNTER — Ambulatory Visit (INDEPENDENT_AMBULATORY_CARE_PROVIDER_SITE_OTHER): Payer: Commercial Managed Care - HMO | Admitting: Endocrinology

## 2015-09-17 VITALS — BP 142/90 | HR 54 | Temp 98.0°F | Resp 14 | Ht 66.0 in | Wt 208.2 lb

## 2015-09-17 DIAGNOSIS — T462X5A Adverse effect of other antidysrhythmic drugs, initial encounter: Secondary | ICD-10-CM

## 2015-09-17 DIAGNOSIS — E0580 Other thyrotoxicosis without thyrotoxic crisis or storm: Secondary | ICD-10-CM

## 2015-09-17 DIAGNOSIS — R5383 Other fatigue: Secondary | ICD-10-CM

## 2015-09-17 MED ORDER — METHIMAZOLE 10 MG PO TABS: 10.0000 mg | ORAL_TABLET | Freq: Every day | ORAL | Status: DC

## 2015-09-17 NOTE — Progress Notes (Signed)
Patient ID: Anna [name], female   DOB: [birth year], 82 y.o.   MRN: [id number]                                                                                                               Reason for Appointment:  Hyperthyroidism, new consultation  Referring physician: None   History of Present Illness:   The patient had been on amiodarone for her atrial fibrillation for 1-1-1/2 years prior to her moving to [place] from [place] in June 2016.  When she was admitted to the hospital in 6/16 for dyspnea, CHF and renal insufficiency she was found to be hyperthyroid with significantly high free T4 level She was not treated for this but her amiodarone was stopped  Patient is unable to give her history and her daughter is accompanying her today Apparently she has had recovery with feelings significant tired and is not able to do much physical activity. She tends to sleep longer also She tends to get short of breath on exertion also The patient does not complain of any palpitations, usually her heart rate is relatively slow No complaints of shakiness, heat intolerance or sweating The patient has lost about 20 lbs since these symptoms started with somewhat decreased appetite She has also had a tendency to hair loss recently  She had follow-up thyroid levels on 08/27/15 during her repeat hospitalization and was found to be hyperthyroid again   When  evaluated with thyroid function tests they showed the following:     Lab Results  Component Value Date   FREET4 1.85* 08/27/2015   FREET4 2.27* 02/12/2015   FREET4 2.33* 01/28/2015   TSH 0.011* 08/27/2015   TSH 0.018* 02/12/2015   TSH 0.020* 01/27/2015    Treatments so far: none     Medication List       This list is accurate as of: 09/17/15  9:08 PM.  Always use your most recent med list.               acetaminophen 500 MG tablet  Commonly known as:  TYLENOL  Take 500 mg by mouth every 6 (six) hours as needed for fever.     ALAVERT 10 MG tablet  Generic drug:  loratadine  Take 10 mg by mouth every other day as needed for allergies.     amLODipine 5 MG tablet  Commonly known as:  NORVASC  Take 1 tablet by mouth daily.     aspirin 325 MG tablet  Take 325 mg by mouth daily.     calcitRIOL 0.5 MCG capsule  Commonly known as:  ROCALTROL  Take 0.5 mcg by mouth 3 (three) times a week. Monday, Wednesday, and Friday only     ciprofloxacin 500 MG tablet  Commonly known as:  CIPRO  Take 1 tablet (500 mg total) by mouth 2 (two) times daily.     docusate sodium 100 MG capsule  Commonly known as:  COLACE  Take 100 mg by mouth daily as needed for mild constipation.  donepezil 10 MG tablet  Commonly known as:  ARICEPT  Take 10 mg by mouth at bedtime.     fluticasone 50 MCG/ACT nasal spray  Commonly known as:  FLONASE  Place 2 sprays into both nostrils daily.     furosemide 20 MG tablet  Commonly known as:  LASIX  Take 3 tablets (60 mg total) by mouth daily.     glimepiride 4 MG tablet  Commonly known as:  AMARYL  Take 4 mg by mouth daily with breakfast.     guaiFENesin 600 MG 12 hr tablet  Commonly known as:  MUCINEX  Take 600 mg by mouth 2 (two) times daily as needed for cough.     methimazole 10 MG tablet  Commonly known as:  TAPAZOLE  Take 1 tablet (10 mg total) by mouth daily.     metoprolol succinate 25 MG 24 hr tablet  Commonly known as:  TOPROL-XL  Take 12.5 mg by mouth daily.     potassium chloride SA 20 MEQ tablet  Commonly known as:  K-DUR,KLOR-CON  Take 20 mEq by mouth daily.     pravastatin 40 MG tablet  Commonly known as:  PRAVACHOL  Take 40 mg by mouth daily.     SYSTANE 0.4-0.3 % Soln  Generic drug:  Polyethyl Glycol-Propyl Glycol  Apply 1 drop to eye 2 (two) times daily as needed (dry eyes).     vitamin B-12 500 MCG tablet  Commonly known as:  CYANOCOBALAMIN  Take 500 mcg by mouth daily.     Vitamin D (Ergocalciferol) 50000 units Caps capsule  Commonly known as:   DRISDOL  Take 50,000 Units by mouth once a week. Mondays            Past Medical History  Diagnosis Date  . CHF (congestive heart failure) (Anna [name])   . Persistent atrial fibrillation (Anna [name])   . Chronic renal insufficiency, stage III (moderate)   . Diabetes mellitus without complication (Anna [name])   . Hypertension   . Dementia   . LBBB (left bundle branch block)   . Overactive bladder   . GI bleeding 11/2013    due to supratherapeutic INR  . Hyperlipidemia   . Vitamin D deficiency   . Vitamin B12 deficiency   . Hyperthyroidism     on amiodarone  . Overweight     Past Surgical History  Procedure Laterality Date  . Abdominal hysterectomy      Family History  Problem Relation Age of Onset  . Heart disease Father   . Diabetes Mother   . Thyroid disease Neg Hx     Social History:  reports that she has never smoked. She does not have any smokeless tobacco history on file. She reports that she does not drink alcohol or use illicit drugs.  Allergies:  Allergies  Allergen Reactions  . Pollen Extract     Sneezing, watery eyes    Review of Systems:  Review of Systems  Constitutional: Positive for weight loss.  HENT: Positive for trouble swallowing. Negative for headaches.        Occasional difficulty swallowing present  Eyes: Positive for blurred vision.  Respiratory: Positive for shortness of breath.   Cardiovascular: Negative for chest pain, palpitations and leg swelling.  Gastrointestinal: Positive for constipation.  Endocrine: Positive for fatigue. Negative for heat intolerance.  Genitourinary: Negative for nocturia.  Musculoskeletal: Negative for joint pain.  Skin: Negative for rash.  Neurological: Positive for balance difficulty.  Psychiatric/Behavioral: Negative for depressed mood.  DIABETES: Currently being managed with Amaryl.  Last A1c 6.1 but her recent glucose was 181   Examination:   BP 142/90 mmHg  Pulse 54  Temp(Src) 98 F (36.7 C)  Resp 14  Ht 5\' 6"   (1.676 m)  Wt 208 lb 3.2 oz (94.439 kg)  BMI 33.62 kg/m2  SpO2 97%   General Appearance:  generalized obesity present, not interactive but pleasant, not anxious.  Mildly restless       Eyes: No unusual prominence, lid lag or stare. No swelling of the eyelids  Neck: The thyroid is nonpalpable There is no lymphadenopathy .          Heart: normal S1 and S2, no murmurs .          Lungs: breath sounds are clear bilaterally Abdomen: no hepatosplenomegaly or other palpable abnormality  Extremities: hands are slightly cool, not diaphoretic. No ankle edema. Neurological: Deep tendon reflexes at biceps are normal to slightly brisk. No  tremors are present. Skin: No rash, abnormal thickening of the skin on legs or pigmentation seen     Assessment/Plan:   Hyperthyroidism, Likely to be from amiodarone and iodine-induced but may have had underlying Hyperthyroidism also since her levels are persistently high over 6 months after stopping amiodarone Does not have a goiter objectively  Because of her multiple comorbidities and current poor state of health her persistent hyperthyroidism is likely to be causing exacerbation of other problems and probably significantly contributing into her weakness and dyspnea on exertion  She does need to be on antithyroid drugs to optimize her thyroid levels to normal Since she is likely has excess iodine in her thyroid will not be able to treat her with the radio active iodine Discussed treatment with methimazole, dosage, possible side effects and duration of treatment  She will start with 10 mg daily and follow-up in 3 weeks with labs   All questions were answered satisfactorily for her daughter today   [facility] 09/17/2015, 9:08 PM

## 2015-09-18 ENCOUNTER — Telehealth (HOSPITAL_COMMUNITY): Payer: Self-pay | Admitting: *Deleted

## 2015-09-18 NOTE — Telephone Encounter (Signed)
Pt daughter called in for advice regarding her mother's HR over last few days. She had called in earlier this week reporting pt having some dizzy spells and her HR would be 45-50 at the time of spells.  Was instructed at that time to give 1/2 tablet of metoprolol if HR less than 50.  States yesterday her mother's HR was in 53s so she did not give metoprolol and today when her mother got up to go to bathroom she had dizziness and HR was 111. After resting for few minutes HR decreased to 60s and dizziness subsided. Educated pt daughter if she does not take metoprolol her HR may increase without the medication especially with exertion. Discussed with Roderic Palau NP will reduce metoprolol to 6.25mg  once a day and monitor HR over weekend and report early next week how her HR is tolerating reduced dose. Of note she was started on thyroid medication tapazole just yesterday by her PCP.  Daughter verbalized understanding and will reduce dose and report vitals early next week.

## 2015-09-28 DIAGNOSIS — N390 Urinary tract infection, site not specified: Secondary | ICD-10-CM | POA: Diagnosis not present

## 2015-09-29 ENCOUNTER — Other Ambulatory Visit (HOSPITAL_COMMUNITY): Payer: Self-pay | Admitting: *Deleted

## 2015-09-29 DIAGNOSIS — I4819 Other persistent atrial fibrillation: Secondary | ICD-10-CM

## 2015-10-01 DIAGNOSIS — R8290 Unspecified abnormal findings in urine: Secondary | ICD-10-CM | POA: Diagnosis not present

## 2015-10-06 ENCOUNTER — Other Ambulatory Visit (INDEPENDENT_AMBULATORY_CARE_PROVIDER_SITE_OTHER): Payer: Commercial Managed Care - HMO

## 2015-10-06 ENCOUNTER — Other Ambulatory Visit (HOSPITAL_COMMUNITY): Payer: Self-pay | Admitting: *Deleted

## 2015-10-06 DIAGNOSIS — I481 Persistent atrial fibrillation: Secondary | ICD-10-CM | POA: Diagnosis not present

## 2015-10-06 DIAGNOSIS — I4819 Other persistent atrial fibrillation: Secondary | ICD-10-CM

## 2015-10-06 DIAGNOSIS — E0580 Other thyrotoxicosis without thyrotoxic crisis or storm: Secondary | ICD-10-CM | POA: Diagnosis not present

## 2015-10-06 LAB — BASIC METABOLIC PANEL
BUN: 28 mg/dL — AB (ref 6–23)
CHLORIDE: 106 meq/L (ref 96–112)
CO2: 26 meq/L (ref 19–32)
CREATININE: 1.64 mg/dL — AB (ref 0.40–1.20)
Calcium: 9.5 mg/dL (ref 8.4–10.5)
GFR: 31.65 mL/min — ABNORMAL LOW (ref >60.00)
Glucose, Bld: 196 mg/dL — ABNORMAL HIGH (ref 70–99)
POTASSIUM: 3.7 meq/L (ref 3.5–5.1)
Sodium: 140 mEq/L (ref 135–145)

## 2015-10-06 LAB — T3, FREE: T3 FREE: 4 pg/mL (ref 2.3–4.2)

## 2015-10-06 LAB — T4, FREE: FREE T4: 1.75 ng/dL — AB (ref 0.60–1.60)

## 2015-10-06 LAB — TSH: TSH: 0.03 u[IU]/mL — ABNORMAL LOW (ref 0.35–4.50)

## 2015-10-08 ENCOUNTER — Ambulatory Visit (INDEPENDENT_AMBULATORY_CARE_PROVIDER_SITE_OTHER): Payer: Commercial Managed Care - HMO | Admitting: Endocrinology

## 2015-10-08 ENCOUNTER — Encounter: Payer: Self-pay | Admitting: Endocrinology

## 2015-10-08 VITALS — BP 134/82 | HR 59 | Temp 98.0°F | Ht 66.0 in

## 2015-10-08 DIAGNOSIS — T462X5A Adverse effect of other antidysrhythmic drugs, initial encounter: Secondary | ICD-10-CM

## 2015-10-08 DIAGNOSIS — E0580 Other thyrotoxicosis without thyrotoxic crisis or storm: Secondary | ICD-10-CM

## 2015-10-08 MED ORDER — METHIMAZOLE 10 MG PO TABS: 10.0000 mg | ORAL_TABLET | Freq: Two times a day (BID) | ORAL | Status: DC

## 2015-10-08 NOTE — Progress Notes (Signed)
Patient ID: Anna [name], female   DOB: [birth year], 82 y.o.   MRN: [id number]                                                                                                               Reason for Appointment:  Hyperthyroidism, follow-up    History of Present Illness:   The patient had been on amiodarone for her atrial fibrillation for 1-1-1/2 years prior to her moving to [place] from [place] in June 2016.  When she was admitted to the hospital in 6/16 for dyspnea, CHF and renal insufficiency she was found to be hyperthyroid with significantly high free T4 level.  She was not treated for this but her amiodarone was stopped  On her initial consultation patient has been noticed to have fatigue, sleepiness, lack of motivation, shortness of breath on exertion and decreased appetite along with weight loss  She had follow-up thyroid levels on 08/27/15 during her repeat hospitalization and was found to be hyperthyroid  She was started on methimazole 10 mg daily which she is taking at night  Her daughter says that more recently the patient has been overall feeling better with reduced sleepiness and fatigue, more energy and better appetite.  She does get some dyspnea on exertion but is able to go out more now   Her thyroid function tests  showed the following:     Lab Results  Component Value Date   FREET4 1.75* 10/06/2015   FREET4 1.85* 08/27/2015   FREET4 2.27* 02/12/2015   TSH 0.03* 10/06/2015   TSH 0.011* 08/27/2015   TSH 0.018* 02/12/2015   . Lab Results  Component Value Date   T3FREE 4.0 10/06/2015        Medication List       This list is accurate as of: 10/08/15  2:06 PM.  Always use your most recent med list.               acetaminophen 500 MG tablet  Commonly known as:  TYLENOL  Take 500 mg by mouth every 6 (six) hours as needed for fever.     ALAVERT 10 MG tablet  Generic drug:  loratadine  Take 10 mg by mouth every other day as needed for allergies.     amLODipine 5 MG tablet  Commonly known as:  NORVASC  Take 1 tablet by mouth daily.     aspirin 325 MG tablet  Take 325 mg by mouth daily.     calcitRIOL 0.5 MCG capsule  Commonly known as:  ROCALTROL  Take 0.5 mcg by mouth 3 (three) times a week. Monday, Wednesday, and Friday only     ciprofloxacin 500 MG tablet  Commonly known as:  CIPRO  Take 1 tablet (500 mg total) by mouth 2 (two) times daily.     docusate sodium 100 MG capsule  Commonly known as:  COLACE  Take 100 mg by mouth daily as needed for mild constipation.     donepezil 10 MG tablet  Commonly known as:  ARICEPT  Take 10 mg by mouth at bedtime.     fluticasone 50 MCG/ACT nasal spray  Commonly known as:  FLONASE  Place 2 sprays into both nostrils daily.     furosemide 20 MG tablet  Commonly known as:  LASIX  Take 3 tablets (60 mg total) by mouth daily.     glimepiride 4 MG tablet  Commonly known as:  AMARYL  Take 4 mg by mouth daily with breakfast.     guaiFENesin 600 MG 12 hr tablet  Commonly known as:  MUCINEX  Take 600 mg by mouth 2 (two) times daily as needed for cough.     methimazole 10 MG tablet  Commonly known as:  TAPAZOLE  Take 1 tablet (10 mg total) by mouth daily.     metoprolol succinate 25 MG 24 hr tablet  Commonly known as:  TOPROL-XL  Take 6.25 mg by mouth daily.     potassium chloride SA 20 MEQ tablet  Commonly known as:  K-DUR,KLOR-CON  Take 20 mEq by mouth daily.     pravastatin 40 MG tablet  Commonly known as:  PRAVACHOL  Take 40 mg by mouth daily.     SYSTANE 0.4-0.3 % Soln  Generic drug:  Polyethyl Glycol-Propyl Glycol  Apply 1 drop to eye 2 (two) times daily as needed (dry eyes).     vitamin B-12 500 MCG tablet  Commonly known as:  CYANOCOBALAMIN  Take 500 mcg by mouth daily.     Vitamin D (Ergocalciferol) 50000 units Caps capsule  Commonly known as:  DRISDOL  Take 50,000 Units by mouth once a week. Mondays            Past Medical History  Diagnosis Date  . CHF  (congestive heart failure) ([place])   . Persistent atrial fibrillation ([place])   . Chronic renal insufficiency, stage III (moderate)   . Diabetes mellitus without complication ([place])   . Hypertension   . Dementia   . LBBB (left bundle branch block)   . Overactive bladder   . GI bleeding 11/2013    due to supratherapeutic INR  . Hyperlipidemia   . Vitamin D deficiency   . Vitamin B12 deficiency   . Hyperthyroidism     on amiodarone  . Overweight     Past Surgical History  Procedure Laterality Date  . Abdominal hysterectomy      Family History  Problem Relation Age of Onset  . Heart disease Father   . Diabetes Mother   . Thyroid disease Neg Hx     Social History:  reports that she has never smoked. She does not have any smokeless tobacco history on file. She reports that she does not drink alcohol or use illicit drugs.  Allergies:  Allergies  Allergen Reactions  . Pollen Extract     Sneezing, watery eyes    Review of Systems:  Review of Systems  DIABETES: Currently being managed with Amaryl.  Last A1c 6.1   Examination:   BP 134/82 mmHg  Pulse 59  Temp(Src) 98 F (36.7 C) (Oral)  Ht 5\' 6"  (1.676 m)  SpO2 95%   General Appearance:   not interactive but pleasant, not anxious, calm   Extremities: hands are warm Neurological: Deep tendon reflexes at biceps show normal relaxation No  tremors are present.   Assessment/Plan:   Hyperthyroidism, Likely to be from amiodarone  but may have had underlying Hyperthyroidism also   With taking 10 mg of methimazole her T4 is only  slightly better. Free T3 is upper normal but baseline level is not available TSH is still low  However her daughter thinks that subjectively she is doing very well now compared to before  Since she may have some resistance to the methimazole because of her amiodarone was increased the dose to 10 mg twice a day Discussed that she may need to take the metoprolol 1/4 tablet twice a day instead of  once a day for 24-hour control of heart rate   Anna [name] 10/08/2015, 2:06 PM

## 2015-10-08 NOTE — Patient Instructions (Signed)
Take Methimazole twice daily

## 2015-10-12 ENCOUNTER — Encounter (HOSPITAL_COMMUNITY): Payer: Self-pay | Admitting: Nurse Practitioner

## 2015-10-12 ENCOUNTER — Ambulatory Visit (HOSPITAL_COMMUNITY)
Admission: RE | Admit: 2015-10-12 | Discharge: 2015-10-12 | Disposition: A | Discharge status: Home / Self Care | Payer: Commercial Managed Care - HMO | Source: Ambulatory Visit | Attending: Nurse Practitioner | Admitting: Nurse Practitioner

## 2015-10-12 VITALS — BP 128/62 | HR 51 | Ht 65.0 in | Wt 210.6 lb

## 2015-10-12 DIAGNOSIS — Z7984 Long term (current) use of oral hypoglycemic drugs: Secondary | ICD-10-CM | POA: Insufficient documentation

## 2015-10-12 DIAGNOSIS — E0590 Thyrotoxicosis, unspecified without thyrotoxic crisis or storm: Secondary | ICD-10-CM | POA: Diagnosis not present

## 2015-10-12 DIAGNOSIS — Z7982 Long term (current) use of aspirin: Secondary | ICD-10-CM | POA: Insufficient documentation

## 2015-10-12 DIAGNOSIS — E1122 Type 2 diabetes mellitus with diabetic chronic kidney disease: Secondary | ICD-10-CM | POA: Diagnosis not present

## 2015-10-12 DIAGNOSIS — Z8249 Family history of ischemic heart disease and other diseases of the circulatory system: Secondary | ICD-10-CM | POA: Diagnosis not present

## 2015-10-12 DIAGNOSIS — I129 Hypertensive chronic kidney disease with stage 1 through stage 4 chronic kidney disease, or unspecified chronic kidney disease: Secondary | ICD-10-CM | POA: Diagnosis not present

## 2015-10-12 DIAGNOSIS — Z79899 Other long term (current) drug therapy: Secondary | ICD-10-CM | POA: Diagnosis not present

## 2015-10-12 DIAGNOSIS — E538 Deficiency of other specified B group vitamins: Secondary | ICD-10-CM | POA: Diagnosis not present

## 2015-10-12 DIAGNOSIS — E663 Overweight: Secondary | ICD-10-CM | POA: Insufficient documentation

## 2015-10-12 DIAGNOSIS — Z6835 Body mass index (BMI) 35.0-35.9, adult: Secondary | ICD-10-CM | POA: Insufficient documentation

## 2015-10-12 DIAGNOSIS — R0602 Shortness of breath: Secondary | ICD-10-CM | POA: Insufficient documentation

## 2015-10-12 DIAGNOSIS — N183 Chronic kidney disease, stage 3 (moderate): Secondary | ICD-10-CM | POA: Diagnosis not present

## 2015-10-12 DIAGNOSIS — Z0000 Encounter for general adult medical examination without abnormal findings: Secondary | ICD-10-CM | POA: Diagnosis not present

## 2015-10-12 DIAGNOSIS — Z87440 Personal history of urinary (tract) infections: Secondary | ICD-10-CM | POA: Diagnosis not present

## 2015-10-12 DIAGNOSIS — N3281 Overactive bladder: Secondary | ICD-10-CM | POA: Diagnosis not present

## 2015-10-12 DIAGNOSIS — F0390 Unspecified dementia without behavioral disturbance: Secondary | ICD-10-CM | POA: Insufficient documentation

## 2015-10-12 DIAGNOSIS — Z833 Family history of diabetes mellitus: Secondary | ICD-10-CM | POA: Diagnosis not present

## 2015-10-12 DIAGNOSIS — E559 Vitamin D deficiency, unspecified: Secondary | ICD-10-CM | POA: Insufficient documentation

## 2015-10-12 DIAGNOSIS — I4891 Unspecified atrial fibrillation: Secondary | ICD-10-CM | POA: Diagnosis present

## 2015-10-12 DIAGNOSIS — E785 Hyperlipidemia, unspecified: Secondary | ICD-10-CM | POA: Diagnosis not present

## 2015-10-12 DIAGNOSIS — I480 Paroxysmal atrial fibrillation: Secondary | ICD-10-CM | POA: Insufficient documentation

## 2015-10-12 NOTE — Progress Notes (Signed)
Patient ID: Anna [name], female   DOB: [birth year], 83 y.o.   MRN: [id number]      Primary Care Physician: Anna Pao, MD Referring Physician: Dr. Sandria [name] Anna [name] is a 82 y.o. female with a h/o PAF.Marland KitchenShe recently moved to [place] from [place] to be near her daughter. She has dementia and requires significant care. She has a h/o persistent afib for which she was previously managed with coumadin and amiodarone. She developed hyperthyroidism and her amiodarone was discontinued. She had difficulty with supratherapeutic INR due to confusions with her previous anticoagulation clinic in [place] and had a GI bleed in 2015. She has been off of coumadin and has been on only ASA since that time. She has had prior bradycardia and thus her beta blocker has been weaned over time. She has has some difficulty with SOB as well as BLE edema. Echo 01/28/15 revealed EF 55%, mild LVH, LA size 41 mm, no significant arrhythmias. She has now been placed on toprol XL 12.5 mg daily and is doing "very well" per pts daughter. The daughter has not noticed  any symptoms which would indicate any afib in the mother. She thinks she is asymptomatic when she does have afib.She does have occasional falls. The family wishes to keep her off anticoagulants.  Afib clinc for f/u 12/29. The daughter has not noticed any afib, but she has had some difficulties with upper respiratory symptoms over the last month. She took two rounds of antibiotics with symptoms of cough, shortness of breath with rest and exertion not improving. The daughter noticed that her abdomen appeared larger so on her own increased her mother's doe of lasix to 60 mg a day from 40 mg daily. Within a few days, her symptoms improved. She then went back to 40 mg on Saturday, but by Tuesday the symptoms returned, so she increased the lasix again and today, she is improved. The pt has an appointment with her PCP tomorrow and wants to discuss if she can stay on  60 mg a day. She is in S brady which is her usual. NO PND/orthopnea. No pedal edema.  Returns to afib clinic 2/20, she had a hospitalization 1/4 for UTI. She also has been found to have hyperactive thyroid and has been started on appropriate meds for this. The pt thinks her mom is doing much better. She has have better energy. The daughter has cut back on her mom's BB to a quarter of a tablet, she does have baseline Sinus brady with LBBB, which is stable.  HR can increase to around 100 with walking but there has been no evidence of afib.   Today, she denies symptoms of palpitations, chest pain, shortness of breath, orthopnea, PND, lower extremity edema, dizziness, presyncope, syncope, or neurologic sequela. The patient is tolerating medications without difficulties and is otherwise without complaint today.   Past Medical History  Diagnosis Date  . CHF (congestive heart failure) ([place])   . Persistent atrial fibrillation ([place])   . Chronic renal insufficiency, stage III (moderate)   . Diabetes mellitus without complication ([place])   . Hypertension   . Dementia   . LBBB (left bundle branch block)   . Overactive bladder   . GI bleeding 11/2013    due to supratherapeutic INR  . Hyperlipidemia   . Vitamin D deficiency   . Vitamin B12 deficiency   . Hyperthyroidism     on amiodarone  . Overweight    Past Surgical History  Procedure Laterality Date  . Abdominal hysterectomy      Current Outpatient Prescriptions  Medication Sig Dispense Refill  . acetaminophen (TYLENOL) 500 MG tablet Take 500 mg by mouth every 6 (six) hours as needed for fever.     Marland Kitchen amLODipine (NORVASC) 5 MG tablet Take 1 tablet by mouth daily.    Marland Kitchen aspirin 325 MG tablet Take 325 mg by mouth daily.    . calcitRIOL (ROCALTROL) 0.5 MCG capsule Take 0.5 mcg by mouth 3 (three) times a week. Monday, Wednesday, and Friday only    . ciprofloxacin (CIPRO) 500 MG tablet Take 1 tablet (500 mg total) by mouth 2 (two) times daily. 14  tablet 0  . docusate sodium (COLACE) 100 MG capsule Take 100 mg by mouth daily as needed for mild constipation.    Marland Kitchen donepezil (ARICEPT) 10 MG tablet Take 10 mg by mouth at bedtime.    . fluticasone (FLONASE) 50 MCG/ACT nasal spray Place 2 sprays into both nostrils daily.    . furosemide (LASIX) 20 MG tablet Take 3 tablets (60 mg total) by mouth daily. 30 tablet 0  . glimepiride (AMARYL) 4 MG tablet Take 4 mg by mouth daily with breakfast.    . guaiFENesin (MUCINEX) 600 MG 12 hr tablet Take 600 mg by mouth 2 (two) times daily as needed for cough.     . loratadine (ALAVERT) 10 MG tablet Take 10 mg by mouth every other day as needed for allergies.    . methimazole (TAPAZOLE) 10 MG tablet Take 1 tablet (10 mg total) by mouth 2 (two) times daily. 60 tablet 2  . metoprolol succinate (TOPROL-XL) 25 MG 24 hr tablet Take 6.25 mg by mouth daily.    Vladimir Faster Glycol-Propyl Glycol (SYSTANE) 0.4-0.3 % SOLN Apply 1 drop to eye 2 (two) times daily as needed (dry eyes).    . potassium chloride SA (K-DUR,KLOR-CON) 20 MEQ tablet Take 20 mEq by mouth daily.    . pravastatin (PRAVACHOL) 40 MG tablet Take 40 mg by mouth daily.    . vitamin B-12 (CYANOCOBALAMIN) 500 MCG tablet Take 500 mcg by mouth daily.    . Vitamin D, Ergocalciferol, (DRISDOL) 50000 UNITS CAPS capsule Take 50,000 Units by mouth once a week. Mondays     No current facility-administered medications for this encounter.    Allergies  Allergen Reactions  . Pollen Extract     Sneezing, watery eyes    Social History   Social History  . Marital Status: Unknown    Spouse Name: N/A  . Number of Children: N/A  . Years of Education: N/A   Occupational History  . Not on file.   Social History Main Topics  . Smoking status: Never Smoker   . Smokeless tobacco: Not on file  . Alcohol Use: No  . Drug Use: No  . Sexual Activity: Not on file   Other Topics Concern  . Not on file   Social History Narrative   Pt recently moved to  [place] with daughter from [place]       Family History  Problem Relation Age of Onset  . Heart disease Father   . Diabetes Mother   . Thyroid disease Neg Hx     ROS- All systems are reviewed and negative except as per the HPI above  Physical Exam: Filed Vitals:   10/12/15 1124  BP: 128/62  Pulse: 51  Height: 5\' 5"  (1.651 m)  Weight: 210 lb 9.6 oz (95.528 kg)  GEN- The patient is well appearing, alert and oriented x 3 today.   Head- normocephalic, atraumatic Eyes-  Sclera clear, conjunctiva pink Ears- hearing intact Oropharynx- clear Neck- supple, no JVP Lymph- no cervical lymphadenopathy Lungs- Clear to ausculation bilaterally, normal work of breathing Heart- Slow, regular rate and rhythm, no murmurs, rubs or gallops, PMI not laterally displaced GI- soft, NT, ND, + BS Extremities- no clubbing, cyanosis, or edema MS- no significant deformity or atrophy Skin- no rash or lesion Psych- euthymic mood, full affect Neuro- strength and sensation are intact  EKG-SB with first degree AV block, LBBB, Pr int 232 ms, QRS 148 ms, QTc 445 ms  Assessment and Plan: 1. PAF Maintaining SR Continue metoprolol at 6.25 mg daily, pt has tendency toward bradycardia, tolerating well. Continue asa per family's wishes, understanding that asa does not protect against stroke per guidelines, but GI bleed in the past on warfarin.  2. Shortness of breath Improved with increase of diuretic to 60 mg daily  3. Hyperthyroidism Improved with treatment  F/u with Endocrinologist  F/u with 3 months  Butch Penny C. Tana Trefry, [facility] [address] [place], [place] [address] [phone]

## 2015-10-30 DIAGNOSIS — N390 Urinary tract infection, site not specified: Secondary | ICD-10-CM | POA: Diagnosis not present

## 2015-11-03 ENCOUNTER — Other Ambulatory Visit (INDEPENDENT_AMBULATORY_CARE_PROVIDER_SITE_OTHER): Payer: Commercial Managed Care - HMO

## 2015-11-03 DIAGNOSIS — E0580 Other thyrotoxicosis without thyrotoxic crisis or storm: Secondary | ICD-10-CM | POA: Diagnosis not present

## 2015-11-03 LAB — TSH: TSH: 0.05 u[IU]/mL — ABNORMAL LOW (ref 0.35–4.50)

## 2015-11-03 LAB — T3, FREE: T3 FREE: 3.1 pg/mL (ref 2.3–4.2)

## 2015-11-03 LAB — T4, FREE: FREE T4: 1.28 ng/dL (ref 0.60–1.60)

## 2015-11-05 ENCOUNTER — Ambulatory Visit (INDEPENDENT_AMBULATORY_CARE_PROVIDER_SITE_OTHER): Payer: Commercial Managed Care - HMO | Admitting: Endocrinology

## 2015-11-05 VITALS — BP 118/62 | HR 60 | Resp 16 | Ht 66.0 in | Wt 211.2 lb

## 2015-11-05 DIAGNOSIS — E0580 Other thyrotoxicosis without thyrotoxic crisis or storm: Secondary | ICD-10-CM

## 2015-11-05 DIAGNOSIS — T462X5A Adverse effect of other antidysrhythmic drugs, initial encounter: Secondary | ICD-10-CM

## 2015-11-05 NOTE — Patient Instructions (Signed)
Same dose 

## 2015-11-05 NOTE — Progress Notes (Signed)
Patient ID: Anna [name], female   DOB: [birth year], 82 y.o.   MRN: [id number]                                                                                                               Reason for Appointment:  Hyperthyroidism, follow-up    History of Present Illness:   The patient had been on amiodarone for her atrial fibrillation for 1-1-1/2 years prior to her moving to [place] from [place] in June 2016.  When she was admitted to the hospital in 6/16 for dyspnea, CHF and renal insufficiency she was found to be hyperthyroid with significantly high free T4 level.  She was not treated for this but her amiodarone was stopped  On her initial consultation patient has been noticed to have fatigue, sleepiness, lack of motivation, shortness of breath on exertion and decreased appetite along with weight loss  She had follow-up thyroid levels on 08/27/15 during her repeat hospitalization and was found to be hyperthyroid  She was started on methimazole 10 mg daily  The dose was increased to twice a day in 2/17 when her free T4 was still high along with high normal free T3  Her daughter says that the patient has been overall feeling better with reduced sleepiness and fatigue, more energy and better appetite.   She does get some dyspnea on exertion and tachycardia but is able to go out and be more active overall No nausea from methimazole   Her thyroid function tests showed the following:     Lab Results  Component Value Date   FREET4 1.28 11/03/2015   FREET4 1.75* 10/06/2015   FREET4 1.85* 08/27/2015   TSH 0.05* 11/03/2015   TSH 0.03* 10/06/2015   TSH 0.011* 08/27/2015   . Lab Results  Component Value Date   T3FREE 3.1 11/03/2015   T3FREE 4.0 10/06/2015        Medication List       This list is accurate as of: 11/05/15 11:59 PM.  Always use your most recent med list.               acetaminophen 500 MG tablet  Commonly known as:  TYLENOL  Take 500 mg by mouth every 6  (six) hours as needed for fever.     ALAVERT 10 MG tablet  Generic drug:  loratadine  Take 10 mg by mouth every other day as needed for allergies.     amLODipine 5 MG tablet  Commonly known as:  NORVASC  Take 1 tablet by mouth daily.     aspirin 325 MG tablet  Take 325 mg by mouth daily.     calcitRIOL 0.5 MCG capsule  Commonly known as:  ROCALTROL  Take 0.5 mcg by mouth 3 (three) times a week. Monday, Wednesday, and Friday only     ciprofloxacin 500 MG tablet  Commonly known as:  CIPRO  Take 1 tablet (500 mg total) by mouth 2 (two) times daily.     docusate sodium 100 MG capsule  Commonly known as:  COLACE  Take 100 mg by mouth daily as needed for mild constipation.     donepezil 10 MG tablet  Commonly known as:  ARICEPT  Take 10 mg by mouth at bedtime.     fluticasone 50 MCG/ACT nasal spray  Commonly known as:  FLONASE  Place 2 sprays into both nostrils daily.     furosemide 20 MG tablet  Commonly known as:  LASIX  Take 3 tablets (60 mg total) by mouth daily.     glimepiride 4 MG tablet  Commonly known as:  AMARYL  Take 4 mg by mouth daily with breakfast.     guaiFENesin 600 MG 12 hr tablet  Commonly known as:  MUCINEX  Take 600 mg by mouth 2 (two) times daily as needed for cough.     methimazole 10 MG tablet  Commonly known as:  TAPAZOLE  Take 1 tablet (10 mg total) by mouth 2 (two) times daily.     metoprolol succinate 25 MG 24 hr tablet  Commonly known as:  TOPROL-XL  Take 6.25 mg by mouth daily.     potassium chloride SA 20 MEQ tablet  Commonly known as:  K-DUR,KLOR-CON  Take 20 mEq by mouth daily.     pravastatin 40 MG tablet  Commonly known as:  PRAVACHOL  Take 40 mg by mouth daily.     SYSTANE 0.4-0.3 % Soln  Generic drug:  Polyethyl Glycol-Propyl Glycol  Apply 1 drop to eye 2 (two) times daily as needed (dry eyes).     vitamin B-12 500 MCG tablet  Commonly known as:  CYANOCOBALAMIN  Take 500 mcg by mouth daily.     Vitamin D  (Ergocalciferol) 50000 units Caps capsule  Commonly known as:  DRISDOL  Take 50,000 Units by mouth once a week. Mondays            Past Medical History  Diagnosis Date  . CHF (congestive heart failure) ([place] [place])   . Persistent atrial fibrillation ([place])   . Chronic renal insufficiency, stage III (moderate)   . Diabetes mellitus without complication ([place])   . Hypertension   . Dementia   . LBBB (left bundle branch block)   . Overactive bladder   . GI bleeding 11/2013    due to supratherapeutic INR  . Hyperlipidemia   . Vitamin D deficiency   . Vitamin B12 deficiency   . Hyperthyroidism     on amiodarone  . Overweight     Past Surgical History  Procedure Laterality Date  . Abdominal hysterectomy      Family History  Problem Relation Age of Onset  . Heart disease Father   . Diabetes Mother   . Thyroid disease Neg Hx     Social History:  reports that she has never smoked. She does not have any smokeless tobacco history on file. She reports that she does not drink alcohol or use illicit drugs.  Allergies:  Allergies  Allergen Reactions  . Pollen Extract     Sneezing, watery eyes    Review of Systems:  Review of Systems  DIABETES: Currently being managed with Amaryl.  Last A1c 6.1   Examination:   BP 118/62 mmHg  Pulse 60  Resp 16  Ht 5\' 6"  (1.676 m)  Wt 211 lb 3.2 oz (95.8 kg)  BMI 34.10 kg/m2  SpO2 97%   General Appearance: She does interact and answers briefly Thyroid barely palpable on the left on swallowing  Extremities: hands are warm  Neurological: Deep tendon reflexes at biceps show normal relaxation No  tremors are present.   Assessment/Plan:   Hyperthyroidism, Likely to be from amiodarone but appears to be persisting  With taking 10 mg of methimazole twice a day her T4 is back to normal along with improved free T3 also She subjectively doing much better also TSH is still low  Since free T4 is in the mid range of normal will continue the same  dose for another 6 weeks of methimazole   Katheryne Gorr 11/06/2015, 10:00 AM

## 2015-11-11 ENCOUNTER — Telehealth: Payer: Self-pay | Admitting: Endocrinology

## 2015-11-11 NOTE — Telephone Encounter (Signed)
She needs to contact her PCP right?

## 2015-11-11 NOTE — Telephone Encounter (Signed)
Need to see PCP

## 2015-11-11 NOTE — Telephone Encounter (Signed)
She hasnt felt well for the last 3 days, she is not talking a lot, she is not answering questions.

## 2015-11-11 NOTE — Telephone Encounter (Signed)
Pt doesn't feel well, she is not herself, please advise, does she need to have labs again

## 2015-11-11 NOTE — Telephone Encounter (Signed)
Noted, patients daughter is aware, she has already contacted her PCP

## 2015-11-12 DIAGNOSIS — Z79899 Other long term (current) drug therapy: Secondary | ICD-10-CM | POA: Diagnosis not present

## 2015-11-12 DIAGNOSIS — R5381 Other malaise: Secondary | ICD-10-CM | POA: Diagnosis not present

## 2015-11-12 DIAGNOSIS — E0590 Thyrotoxicosis, unspecified without thyrotoxic crisis or storm: Secondary | ICD-10-CM | POA: Diagnosis not present

## 2015-11-12 DIAGNOSIS — M2550 Pain in unspecified joint: Secondary | ICD-10-CM | POA: Diagnosis not present

## 2015-11-12 DIAGNOSIS — Z6833 Body mass index (BMI) 33.0-33.9, adult: Secondary | ICD-10-CM | POA: Diagnosis not present

## 2015-11-13 ENCOUNTER — Telehealth: Payer: Self-pay | Admitting: Endocrinology

## 2015-11-13 NOTE — Telephone Encounter (Signed)
Labs on 3/23 show free T4 1.2, previously 1.28 and total T3 71, previously free T3 was 3.1 Patient reportedly has fatigue and somnolence. Will reduce her methimazole to 10 mg in the morning and 5 mg in the evening instead of 10 mg twice a day  Please fax a copy of this to [employer] [facility] [employer], [phone]

## 2015-11-16 NOTE — Telephone Encounter (Signed)
This has been faxed to [facility]

## 2015-11-19 ENCOUNTER — Telehealth: Payer: Self-pay | Admitting: *Deleted

## 2015-11-19 ENCOUNTER — Telehealth: Payer: Self-pay | Admitting: Endocrinology

## 2015-11-19 DIAGNOSIS — R627 Adult failure to thrive: Secondary | ICD-10-CM | POA: Diagnosis not present

## 2015-11-19 DIAGNOSIS — R946 Abnormal results of thyroid function studies: Secondary | ICD-10-CM | POA: Diagnosis not present

## 2015-11-19 DIAGNOSIS — Z6832 Body mass index (BMI) 32.0-32.9, adult: Secondary | ICD-10-CM | POA: Diagnosis not present

## 2015-11-19 DIAGNOSIS — N184 Chronic kidney disease, stage 4 (severe): Secondary | ICD-10-CM | POA: Diagnosis not present

## 2015-11-19 DIAGNOSIS — I10 Essential (primary) hypertension: Secondary | ICD-10-CM | POA: Diagnosis not present

## 2015-11-19 DIAGNOSIS — E7800 Pure hypercholesterolemia, unspecified: Secondary | ICD-10-CM | POA: Diagnosis not present

## 2015-11-19 DIAGNOSIS — E1129 Type 2 diabetes mellitus with other diabetic kidney complication: Secondary | ICD-10-CM | POA: Diagnosis not present

## 2015-11-19 DIAGNOSIS — I480 Paroxysmal atrial fibrillation: Secondary | ICD-10-CM | POA: Diagnosis not present

## 2015-11-19 DIAGNOSIS — G309 Alzheimer's disease, unspecified: Secondary | ICD-10-CM | POA: Diagnosis not present

## 2015-11-19 NOTE — Telephone Encounter (Signed)
Patients daughter called very upset. She said she has seen her mothers thyroid "flip" in 10 days and what makes you think that it would not do it again in 8 days, she said she has not been taking the Methimazole at night. She was extremely upset that when she called the first time about her mother being lethargic you had said it's not from her Thyroid and to see her PCP, when she contacted her moms PCP they said it was her thyroid and that she was getting way to much medicine.  Patient is demanding to talk you to to find out what's going on, she did not believe me when I told her I tried calling her back on Friday and all this week, she basically called me a liar and said she had not seen any calls. I told her that her voice mail was full and I couldn't leave a message. Please return her call.  CB # [phone]

## 2015-11-19 NOTE — Telephone Encounter (Signed)
Explained to the daughter that her blood tests were not very different except that T3 test was relatively lower but done by a different method She is currently doing better Currently she is taking only 10 mg a day since last Thursday.  Recommend that she have labs done again on 4/6 and see me on 4/7

## 2015-11-19 NOTE — Telephone Encounter (Signed)
noted 

## 2015-11-19 NOTE — Telephone Encounter (Signed)
Patient daughter would to talk to you about her medication.

## 2015-11-26 ENCOUNTER — Other Ambulatory Visit (INDEPENDENT_AMBULATORY_CARE_PROVIDER_SITE_OTHER): Payer: Commercial Managed Care - HMO

## 2015-11-26 DIAGNOSIS — E0580 Other thyrotoxicosis without thyrotoxic crisis or storm: Secondary | ICD-10-CM | POA: Diagnosis not present

## 2015-11-26 LAB — T4, FREE: Free T4: 1.01 ng/dL (ref 0.60–1.60)

## 2015-11-26 LAB — T3, FREE: T3 FREE: 3 pg/mL (ref 2.3–4.2)

## 2015-11-26 LAB — TSH: TSH: 0.09 u[IU]/mL — ABNORMAL LOW (ref 0.35–4.50)

## 2015-11-27 ENCOUNTER — Encounter: Payer: Self-pay | Admitting: Endocrinology

## 2015-11-27 ENCOUNTER — Ambulatory Visit (INDEPENDENT_AMBULATORY_CARE_PROVIDER_SITE_OTHER): Payer: Commercial Managed Care - HMO | Admitting: Endocrinology

## 2015-11-27 VITALS — BP 110/62 | HR 54 | Temp 97.8°F | Resp 16 | Ht 66.0 in

## 2015-11-27 DIAGNOSIS — Z87440 Personal history of urinary (tract) infections: Secondary | ICD-10-CM | POA: Diagnosis not present

## 2015-11-27 DIAGNOSIS — E0580 Other thyrotoxicosis without thyrotoxic crisis or storm: Secondary | ICD-10-CM | POA: Diagnosis not present

## 2015-11-27 DIAGNOSIS — N3281 Overactive bladder: Secondary | ICD-10-CM | POA: Diagnosis not present

## 2015-11-27 DIAGNOSIS — Z0000 Encounter for general adult medical examination without abnormal findings: Secondary | ICD-10-CM | POA: Diagnosis not present

## 2015-11-27 NOTE — Patient Instructions (Signed)
Take 1/2 alternating with 1 pill

## 2015-11-27 NOTE — Progress Notes (Signed)
Patient ID: Anna [name], female   DOB: [birth year], 82 y.o.   MRN: [id number]                                                                                                               Reason for Appointment:  Hyperthyroidism, follow-up    History of Present Illness:   The patient had been on amiodarone for her atrial fibrillation for 1-1-1/2 years prior to her moving to [place] from [place] in June 2016.  When she was admitted to the hospital in 6/16 for dyspnea, CHF and renal insufficiency she was found to be hyperthyroid with significantly high free T4 level.  She was not treated for this but her amiodarone was stopped  On her initial consultation patient has been noticed to have fatigue, sleepiness, lack of motivation, shortness of breath on exertion and decreased appetite along with weight loss  She had follow-up thyroid levels on 08/27/15 during her repeat hospitalization and was found to be hyperthyroid  She was started on methimazole 10 mg daily  The dose was increased to twice a day in 2/17 when her free T4 was still high along with high normal free T3  Although on her last visit she was doing very well with decreased fatigue, drowsiness, more energy and being more interactive a few days after her last visit she started getting somewhat lethargic, achy without fever for about 3 days  Labs done by PCP on 3/23 show free T4 1.2, previously 1.28 and total T3 71  Since 3/24 her evening dose was stopped by PCP and she is now taking 10 mg daily and this has been continued Her daughter think that she is more alert now but not back to normal.  She has chronic cold intolerance   Her thyroid function tests showed the following:     Lab Results  Component Value Date   FREET4 1.01 11/26/2015   FREET4 1.28 11/03/2015   FREET4 1.75* 10/06/2015   TSH 0.09* 11/26/2015   TSH 0.05* 11/03/2015   TSH 0.03* 10/06/2015   . Lab Results  Component Value Date   T3FREE 3.0 11/26/2015   T3FREE 3.1 11/03/2015   T3FREE 4.0 10/06/2015        Medication List       This list is accurate as of: 11/27/15 10:10 AM.  Always use your most recent med list.               ACCU-CHEK AVIVA PLUS test strip  Generic drug:  glucose blood  PATIENT TO CHECK SUGARS ONCE DAILY.     acetaminophen 500 MG tablet  Commonly known as:  TYLENOL  Take 500 mg by mouth every 6 (six) hours as needed for fever.     ALAVERT 10 MG tablet  Generic drug:  loratadine  Take 10 mg by mouth every other day as needed for allergies.     amLODipine 5 MG tablet  Commonly known as:  NORVASC  Take 1 tablet by mouth daily.  aspirin 325 MG tablet  Take 325 mg by mouth daily.     calcitRIOL 0.5 MCG capsule  Commonly known as:  ROCALTROL  Take 0.5 mcg by mouth 3 (three) times a week. Monday, Wednesday, and Friday only     ciprofloxacin 500 MG tablet  Commonly known as:  CIPRO  Take 1 tablet (500 mg total) by mouth 2 (two) times daily.     docusate sodium 100 MG capsule  Commonly known as:  COLACE  Take 100 mg by mouth daily as needed for mild constipation.     donepezil 10 MG tablet  Commonly known as:  ARICEPT  Take 10 mg by mouth at bedtime.     fluticasone 50 MCG/ACT nasal spray  Commonly known as:  FLONASE  Place 2 sprays into both nostrils daily.     furosemide 20 MG tablet  Commonly known as:  LASIX  Take 3 tablets (60 mg total) by mouth daily.     glimepiride 4 MG tablet  Commonly known as:  AMARYL  Take 4 mg by mouth daily with breakfast.     guaiFENesin 600 MG 12 hr tablet  Commonly known as:  MUCINEX  Take 600 mg by mouth 2 (two) times daily as needed for cough.     methimazole 10 MG tablet  Commonly known as:  TAPAZOLE  Take 1 tablet (10 mg total) by mouth 2 (two) times daily.     metoprolol succinate 25 MG 24 hr tablet  Commonly known as:  TOPROL-XL  Take 6.25 mg by mouth daily.     MYRBETRIQ 50 MG Tb24 tablet  Generic drug:  mirabegron ER     potassium  chloride SA 20 MEQ tablet  Commonly known as:  K-DUR,KLOR-CON  Take 20 mEq by mouth daily.     pravastatin 40 MG tablet  Commonly known as:  PRAVACHOL  Take 40 mg by mouth daily.     SYSTANE 0.4-0.3 % Soln  Generic drug:  Polyethyl Glycol-Propyl Glycol  Apply 1 drop to eye 2 (two) times daily as needed (dry eyes).     vitamin B-12 500 MCG tablet  Commonly known as:  CYANOCOBALAMIN  Take 500 mcg by mouth daily.     Vitamin D (Ergocalciferol) 50000 units Caps capsule  Commonly known as:  DRISDOL  Take 50,000 Units by mouth once a week. Mondays            Past Medical History  Diagnosis Date  . CHF (congestive heart failure) ([place])   . Persistent atrial fibrillation ([place])   . Chronic renal insufficiency, stage III (moderate)   . Diabetes mellitus without complication ([place])   . Hypertension   . Dementia   . LBBB (left bundle branch block)   . Overactive bladder   . GI bleeding 11/2013    due to supratherapeutic INR  . Hyperlipidemia   . Vitamin D deficiency   . Vitamin B12 deficiency   . Hyperthyroidism     on amiodarone  . Overweight     Past Surgical History  Procedure Laterality Date  . Abdominal hysterectomy      Family History  Problem Relation Age of Onset  . Heart disease Father   . Diabetes Mother   . Thyroid disease Neg Hx     Social History:  reports that she has never smoked. She does not have any smokeless tobacco history on file. She reports that she does not drink alcohol or use illicit drugs.  Allergies:  Allergies  Allergen Reactions  . Pollen Extract     Sneezing, watery eyes    Review of Systems:  Review of Systems  DIABETES: Currently being managed with Amaryl.  Last A1c 6.1   Examination:   BP 110/62 mmHg  Pulse 54  Temp(Src) 97.8 F (36.6 C)  Resp 16  Ht 5\' 6"  (1.676 m)  Wt   SpO2 96%   General Appearance: She Is alert but not interacting much  Extremities: hands are warm Neurological: Deep tendon reflexes at biceps  show normal relaxation   Assessment/Plan:   Hyperthyroidism, from amiodarone which was previously long-standing before starting treatment and symptomatic Baseline  abnormality was mostly a high free T4 level  With progressively increasing her dose to 20 mg she was doing subjectively better However within a week of her having normal levels she started getting more lethargic and achy and the only change in her labs was a low-normal total T3 level  With taking 10 mg of methimazole once a day over the last 2 weeks or so she does appear to be doing fairly well  Her free T4 is slightly lower than last month and free T3 is stable at 3.1  TSH is still low  Since it appears that her free T4 is coming down fairly quickly now will reduce her dose from 10 mg to 7.5 and have her come back in 3 weeks.  Her daughter will alternate the 10 mg with 5 mg dose We may be over to taper off her methimazole fairly quickly over the next couple of months   Arian Murley 11/27/2015, 10:10 AM

## 2015-12-15 ENCOUNTER — Other Ambulatory Visit: Payer: Commercial Managed Care - HMO

## 2015-12-17 ENCOUNTER — Other Ambulatory Visit (INDEPENDENT_AMBULATORY_CARE_PROVIDER_SITE_OTHER): Payer: Commercial Managed Care - HMO

## 2015-12-17 ENCOUNTER — Ambulatory Visit: Payer: Commercial Managed Care - HMO | Admitting: Endocrinology

## 2015-12-17 DIAGNOSIS — E0580 Other thyrotoxicosis without thyrotoxic crisis or storm: Secondary | ICD-10-CM

## 2015-12-17 LAB — TSH: TSH: 0.67 u[IU]/mL (ref 0.35–4.50)

## 2015-12-17 LAB — T4, FREE: Free T4: 0.87 ng/dL (ref 0.60–1.60)

## 2015-12-17 LAB — T3, FREE: T3, Free: 2.9 pg/mL (ref 2.3–4.2)

## 2015-12-18 ENCOUNTER — Other Ambulatory Visit: Payer: Commercial Managed Care - HMO

## 2015-12-18 ENCOUNTER — Telehealth: Payer: Self-pay | Admitting: Endocrinology

## 2015-12-18 NOTE — Telephone Encounter (Signed)
Noted, patients daughter is aware. 

## 2015-12-18 NOTE — Telephone Encounter (Signed)
Patient daughter ask is it ok for patient to continue the medication methimazole? She is sleeping a lot, let her know if she need to cut back some, please advise

## 2015-12-18 NOTE — Telephone Encounter (Signed)
Ok to stop

## 2015-12-18 NOTE — Telephone Encounter (Signed)
Please see below and advise.

## 2015-12-22 ENCOUNTER — Ambulatory Visit (INDEPENDENT_AMBULATORY_CARE_PROVIDER_SITE_OTHER): Payer: Commercial Managed Care - HMO | Admitting: Endocrinology

## 2015-12-22 ENCOUNTER — Encounter: Payer: Self-pay | Admitting: Endocrinology

## 2015-12-22 VITALS — BP 136/82 | HR 55 | Temp 98.3°F | Resp 16 | Ht 66.0 in

## 2015-12-22 DIAGNOSIS — E0580 Other thyrotoxicosis without thyrotoxic crisis or storm: Secondary | ICD-10-CM

## 2015-12-22 NOTE — Progress Notes (Signed)
Patient ID: Anna [name], female   DOB: May 21, 1929, 82 y.o.   MRN: [id number]                                                                                                               Reason for Appointment:  Hyperthyroidism, follow-up    History of Present Illness:   The patient had been on amiodarone for her atrial fibrillation for 1-1-1/2 years prior to her moving to [place] from [place] in June 2016.  When she was admitted to the hospital in 6/16 for dyspnea, CHF and renal insufficiency she was found to be hyperthyroid with significantly high free T4 level.  She was not treated for this but her amiodarone was stopped  On her initial consultation patient has been noticed to have fatigue, sleepiness, lack of motivation, shortness of breath on exertion and decreased appetite along with weight loss She had follow-up thyroid levels on 08/27/15 during her repeat hospitalization and was found to be hyperthyroid  She was started on methimazole 10 mg daily  The dose was increased to twice a day in 2/17 when her free T4 was still high along with high normal free T3 With this she was subjectively improved and was much more alert and interactive  Since late March her thyroid levels have been getting relatively lower and she appears to get more lethargic when she has had a decline in her free T4 levels Her dose was reduced to 7.5 mg average daily in 4/17, she was supposed to follow-up in 3 weeks but is somewhat delayed in follow-up  Last week she apparently started getting more withdrawn and somewhat lethargic and less active  Labs done last week show her free T4 to be slightly lower again and TSH is now normal compared to previous suppressed levels Her methimazole was stopped on Saturday and she has gradually improved with her alertness since then  She has chronic cold intolerance   Her thyroid function tests showed the following:     Lab Results  Component Value Date   FREET4 0.87  12/17/2015   FREET4 1.01 11/26/2015   FREET4 1.28 11/03/2015   TSH 0.67 12/17/2015   TSH 0.09* 11/26/2015   TSH 0.05* 11/03/2015   . Lab Results  Component Value Date   T3FREE 2.9 12/17/2015   T3FREE 3.0 11/26/2015   T3FREE 3.1 11/03/2015        Medication List       This list is accurate as of: 12/22/15  2:20 PM.  Always use your most recent med list.               ACCU-CHEK AVIVA PLUS test strip  Generic drug:  glucose blood  PATIENT TO CHECK SUGARS ONCE DAILY.     acetaminophen 500 MG tablet  Commonly known as:  TYLENOL  Take 500 mg by mouth every 6 (six) hours as needed for fever.     ALAVERT 10 MG tablet  Generic drug:  loratadine  Take 10 mg by mouth every other  day as needed for allergies.     amLODipine 5 MG tablet  Commonly known as:  NORVASC  Take 1 tablet by mouth daily.     aspirin 325 MG tablet  Take 325 mg by mouth daily.     calcitRIOL 0.5 MCG capsule  Commonly known as:  ROCALTROL  Take 0.5 mcg by mouth 3 (three) times a week. Monday, Wednesday, and Friday only     ciprofloxacin 500 MG tablet  Commonly known as:  CIPRO  Take 1 tablet (500 mg total) by mouth 2 (two) times daily.     docusate sodium 100 MG capsule  Commonly known as:  COLACE  Take 100 mg by mouth daily as needed for mild constipation.     donepezil 10 MG tablet  Commonly known as:  ARICEPT  Take 10 mg by mouth at bedtime.     fluticasone 50 MCG/ACT nasal spray  Commonly known as:  FLONASE  Place 2 sprays into both nostrils daily.     furosemide 20 MG tablet  Commonly known as:  LASIX  Take 3 tablets (60 mg total) by mouth daily.     glimepiride 4 MG tablet  Commonly known as:  AMARYL  Take 4 mg by mouth daily with breakfast.     guaiFENesin 600 MG 12 hr tablet  Commonly known as:  MUCINEX  Take 600 mg by mouth 2 (two) times daily as needed for cough. Reported on 12/22/2015     methimazole 10 MG tablet  Commonly known as:  TAPAZOLE  Take 1 tablet (10 mg total)  by mouth 2 (two) times daily.     metoprolol succinate 25 MG 24 hr tablet  Commonly known as:  TOPROL-XL  Take 6.25 mg by mouth daily.     MYRBETRIQ 50 MG Tb24 tablet  Generic drug:  mirabegron ER     potassium chloride SA 20 MEQ tablet  Commonly known as:  K-DUR,KLOR-CON  Take 20 mEq by mouth daily.     pravastatin 40 MG tablet  Commonly known as:  PRAVACHOL  Take 40 mg by mouth daily.     SYSTANE 0.4-0.3 % Soln  Generic drug:  Polyethyl Glycol-Propyl Glycol  Apply 1 drop to eye 2 (two) times daily as needed (dry eyes).     vitamin B-12 500 MCG tablet  Commonly known as:  CYANOCOBALAMIN  Take 500 mcg by mouth daily.     Vitamin D (Ergocalciferol) 50000 units Caps capsule  Commonly known as:  DRISDOL  Take 50,000 Units by mouth once a week. Mondays            Past Medical History  Diagnosis Date  . CHF (congestive heart failure) ([place])   . Persistent atrial fibrillation ([place])   . Chronic renal insufficiency, stage III (moderate)   . Diabetes mellitus without complication ([place])   . Hypertension   . Dementia   . LBBB (left bundle branch block)   . Overactive bladder   . GI bleeding 11/2013    due to supratherapeutic INR  . Hyperlipidemia   . Vitamin D deficiency   . Vitamin B12 deficiency   . Hyperthyroidism     on amiodarone  . Overweight     Past Surgical History  Procedure Laterality Date  . Abdominal hysterectomy      Family History  Problem Relation Age of Onset  . Heart disease Father   . Diabetes Mother   . Thyroid disease Neg Hx     Social History:  reports that she has never smoked. She does not have any smokeless tobacco history on file. She reports that she does not drink alcohol or use illicit drugs.  Allergies:  Allergies  Allergen Reactions  . Pollen Extract     Sneezing, watery eyes    Review of Systems:  Review of Systems  DIABETES: Currently being managed with Amaryl.  Last A1c 6.1   Examination:   BP 136/82 mmHg  Pulse 55   Temp(Src) 98.3 F (36.8 C)  Resp 16  Ht 5\' 6"  (1.676 m)  Wt   SpO2 96%   General Appearance: She Is alert And somewhat responsive Thyroid not clearly palpable Neurological: Deep tendon reflexes at biceps show normal relaxation   Assessment/Plan:   Hyperthyroidism, from amiodarone which was previously long-standing before starting treatment and symptomatic Baseline abnormality was mostly a high free T4 level  After initial increase in her methimazole her thyroid levels have progressively declined requiring continued reduction in her methimazole doses More recently with her free T4 level being 0.87 she started getting somewhat lethargic last week but with stopping her methimazole over the weekend she is now improving and is somewhat more communicative, however not quite as mobile as before  Her Hyperthyroidism likely to be in remission now with the effect of amiodarone wearing off We will have her stay off the methimazole and have her come back in 3 weeks for follow-up   Nemesis Rainwater 12/22/2015, 2:20 PM

## 2015-12-23 ENCOUNTER — Ambulatory Visit: Payer: Commercial Managed Care - HMO | Admitting: Endocrinology

## 2016-01-05 ENCOUNTER — Other Ambulatory Visit (HOSPITAL_COMMUNITY): Payer: Self-pay | Admitting: *Deleted

## 2016-01-05 DIAGNOSIS — I480 Paroxysmal atrial fibrillation: Secondary | ICD-10-CM

## 2016-01-08 ENCOUNTER — Other Ambulatory Visit (INDEPENDENT_AMBULATORY_CARE_PROVIDER_SITE_OTHER): Payer: Commercial Managed Care - HMO

## 2016-01-08 ENCOUNTER — Other Ambulatory Visit: Payer: Commercial Managed Care - HMO

## 2016-01-08 DIAGNOSIS — Z8759 Personal history of other complications of pregnancy, childbirth and the puerperium: Secondary | ICD-10-CM

## 2016-01-08 DIAGNOSIS — I480 Paroxysmal atrial fibrillation: Secondary | ICD-10-CM | POA: Diagnosis not present

## 2016-01-08 DIAGNOSIS — E0580 Other thyrotoxicosis without thyrotoxic crisis or storm: Secondary | ICD-10-CM

## 2016-01-08 LAB — BASIC METABOLIC PANEL
BUN: 27 mg/dL — AB (ref 6–23)
CHLORIDE: 107 meq/L (ref 96–112)
CO2: 25 meq/L (ref 19–32)
CREATININE: 1.6 mg/dL — AB (ref 0.40–1.20)
Calcium: 9.3 mg/dL (ref 8.4–10.5)
GFR: 32.55 mL/min — ABNORMAL LOW (ref >60.00)
Glucose, Bld: 173 mg/dL — ABNORMAL HIGH (ref 70–99)
POTASSIUM: 4.3 meq/L (ref 3.5–5.1)
Sodium: 140 mEq/L (ref 135–145)

## 2016-01-08 LAB — TSH: TSH: 0.48 u[IU]/mL (ref 0.35–4.50)

## 2016-01-08 LAB — T4, FREE: Free T4: 0.88 ng/dL (ref 0.60–1.60)

## 2016-01-11 ENCOUNTER — Encounter: Payer: Self-pay | Admitting: Endocrinology

## 2016-01-11 ENCOUNTER — Ambulatory Visit (INDEPENDENT_AMBULATORY_CARE_PROVIDER_SITE_OTHER): Payer: Commercial Managed Care - HMO | Admitting: Endocrinology

## 2016-01-11 ENCOUNTER — Ambulatory Visit (HOSPITAL_COMMUNITY)
Admission: RE | Admit: 2016-01-11 | Discharge: 2016-01-11 | Disposition: A | Discharge status: Home / Self Care | Payer: Commercial Managed Care - HMO | Source: Ambulatory Visit | Attending: Nurse Practitioner | Admitting: Nurse Practitioner

## 2016-01-11 ENCOUNTER — Encounter (HOSPITAL_COMMUNITY): Payer: Self-pay | Admitting: Nurse Practitioner

## 2016-01-11 VITALS — BP 110/50 | HR 55 | Ht 66.0 in | Wt 211.4 lb

## 2016-01-11 VITALS — BP 112/64 | HR 56 | Temp 97.8°F | Resp 16 | Ht 66.0 in | Wt 202.2 lb

## 2016-01-11 DIAGNOSIS — I130 Hypertensive heart and chronic kidney disease with heart failure and stage 1 through stage 4 chronic kidney disease, or unspecified chronic kidney disease: Secondary | ICD-10-CM | POA: Insufficient documentation

## 2016-01-11 DIAGNOSIS — I509 Heart failure, unspecified: Secondary | ICD-10-CM | POA: Diagnosis not present

## 2016-01-11 DIAGNOSIS — E0590 Thyrotoxicosis, unspecified without thyrotoxic crisis or storm: Secondary | ICD-10-CM | POA: Insufficient documentation

## 2016-01-11 DIAGNOSIS — E785 Hyperlipidemia, unspecified: Secondary | ICD-10-CM | POA: Diagnosis not present

## 2016-01-11 DIAGNOSIS — E1122 Type 2 diabetes mellitus with diabetic chronic kidney disease: Secondary | ICD-10-CM | POA: Insufficient documentation

## 2016-01-11 DIAGNOSIS — R001 Bradycardia, unspecified: Secondary | ICD-10-CM | POA: Insufficient documentation

## 2016-01-11 DIAGNOSIS — R0602 Shortness of breath: Secondary | ICD-10-CM | POA: Insufficient documentation

## 2016-01-11 DIAGNOSIS — Z833 Family history of diabetes mellitus: Secondary | ICD-10-CM | POA: Insufficient documentation

## 2016-01-11 DIAGNOSIS — Z8249 Family history of ischemic heart disease and other diseases of the circulatory system: Secondary | ICD-10-CM | POA: Diagnosis not present

## 2016-01-11 DIAGNOSIS — I480 Paroxysmal atrial fibrillation: Secondary | ICD-10-CM | POA: Diagnosis not present

## 2016-01-11 DIAGNOSIS — E559 Vitamin D deficiency, unspecified: Secondary | ICD-10-CM | POA: Insufficient documentation

## 2016-01-11 DIAGNOSIS — Z79899 Other long term (current) drug therapy: Secondary | ICD-10-CM | POA: Insufficient documentation

## 2016-01-11 DIAGNOSIS — Z8489 Family history of other specified conditions: Secondary | ICD-10-CM | POA: Diagnosis not present

## 2016-01-11 DIAGNOSIS — E0580 Other thyrotoxicosis without thyrotoxic crisis or storm: Secondary | ICD-10-CM

## 2016-01-11 DIAGNOSIS — F0390 Unspecified dementia without behavioral disturbance: Secondary | ICD-10-CM | POA: Insufficient documentation

## 2016-01-11 DIAGNOSIS — N3281 Overactive bladder: Secondary | ICD-10-CM | POA: Diagnosis not present

## 2016-01-11 DIAGNOSIS — I447 Left bundle-branch block, unspecified: Secondary | ICD-10-CM | POA: Insufficient documentation

## 2016-01-11 DIAGNOSIS — N183 Chronic kidney disease, stage 3 (moderate): Secondary | ICD-10-CM | POA: Insufficient documentation

## 2016-01-11 DIAGNOSIS — Z90710 Acquired absence of both cervix and uterus: Secondary | ICD-10-CM | POA: Insufficient documentation

## 2016-01-11 DIAGNOSIS — Z7982 Long term (current) use of aspirin: Secondary | ICD-10-CM | POA: Insufficient documentation

## 2016-01-11 NOTE — Progress Notes (Signed)
Patient ID: Anna [name], female   DOB: [birth year], 82 y.o.   MRN: [id number]      Primary Care Physician: Haywood Pao, MD Referring Physician: Dr. Sandria Manly Demani Zuloaga is a 82 y.o. female with a h/o PAF. Initially seen 9/16. She recently moved to [place] from [place] to be near her daughter. She has dementia and requires significant care. She has a h/o persistent afib for which she was previously managed with coumadin and amiodarone. She developed hyperthyroidism and her amiodarone was discontinued. She had difficulty with supratherapeutic INR due to confusions with her previous anticoagulation clinic in [place] and had a GI bleed in 2015. She has been off of coumadin and has been on only ASA since that time. She has had prior bradycardia and thus her beta blocker has been weaned over time. She has has some difficulty with SOB as well as BLE edema. Echo 01/28/15 revealed EF 55%, mild LVH, LA size 41 mm, no significant arrhythmias. She has now been placed on toprol XL 12.5 mg daily and is doing "very well" per pts daughter. The daughter has not noticed  any symptoms which would indicate any afib in the mother. She thinks she is asymptomatic when she does have afib.She does have occasional falls. The family wishes to keep her off anticoagulants.  Afib clinc for f/u 12/29. The daughter has not noticed any afib, but she has had some difficulties with upper respiratory symptoms over the last month. She took two rounds of antibiotics with symptoms of cough, shortness of breath with rest and exertion not improving. The daughter noticed that her abdomen appeared larger so on her own increased her mother's doe of lasix to 60 mg a day from 40 mg daily. Within a few days, her symptoms improved. She then went back to 40 mg on Saturday, but by Tuesday the symptoms returned, so she increased the lasix again and today, she is improved. The pt has an appointment with her PCP tomorrow and wants to  discuss if she can stay on 60 mg a day. She is in S brady which is her usual. NO PND/orthopnea. No pedal edema.  Returns to afib clinic 2/20, she had a hospitalization 1/4 for UTI. She also has been found to have hyperactive thyroid and has been started on appropriate meds for this. The pt thinks her mom is doing much better. She has have better energy. The daughter has cut back on her mom's BB to a quarter of a tablet, she does have baseline Sinus brady with LBBB, which is stable.  HR can increase to around 100 with walking but there has been no evidence of afib.  Clinic visit 5/22, per daughter and review of records thyroid has stabilized. She has some ongoing issues with fluid retention and daughters concern if she is over or under hydrated. Very hard to weight daily.She will adjust her lasix between 40-60 mg a day from her symptoms. Bmet done recently showed stable renal status with Creat cl around 1.6. She looks normovolemic in office today. She does not participate in the clinic conversation but is alert. She is in sinus brady today. No reason to think afib has been present.  Today, daughter denies symptoms of palpitations, chest pain, shortness of breath, orthopnea, PND, lower extremity edema, dizziness, presyncope, syncope, or neurologic sequela. The patient is tolerating medications without difficulties and is otherwise without complaint today.   Past Medical History  Diagnosis Date  . CHF (congestive heart failure) (  [place])   . Persistent atrial fibrillation ([place])   . Chronic renal insufficiency, stage III (moderate)   . Diabetes mellitus without complication ([place])   . Hypertension   . Dementia   . LBBB (left bundle branch block)   . Overactive bladder   . GI bleeding 11/2013    due to supratherapeutic INR  . Hyperlipidemia   . Vitamin D deficiency   . Vitamin B12 deficiency   . Hyperthyroidism     on amiodarone  . Overweight    Past Surgical History  Procedure Laterality Date  .  Abdominal hysterectomy      Current Outpatient Prescriptions  Medication Sig Dispense Refill  . acetaminophen (TYLENOL) 500 MG tablet Take 500 mg by mouth every 6 (six) hours as needed for fever.     Marland Kitchen amLODipine (NORVASC) 5 MG tablet Take 1 tablet by mouth daily.    Marland Kitchen aspirin 325 MG tablet Take 325 mg by mouth daily.    . calcitRIOL (ROCALTROL) 0.5 MCG capsule Take 0.5 mcg by mouth 3 (three) times a week. Monday, Wednesday, and Friday only    . docusate sodium (COLACE) 100 MG capsule Take 100 mg by mouth daily as needed for mild constipation.    Marland Kitchen donepezil (ARICEPT) 10 MG tablet Take 10 mg by mouth at bedtime.    . fluticasone (FLONASE) 50 MCG/ACT nasal spray Place 2 sprays into both nostrils daily.    . furosemide (LASIX) 20 MG tablet Take 3 tablets (60 mg total) by mouth daily. 30 tablet 0  . glimepiride (AMARYL) 4 MG tablet Take 4 mg by mouth daily with breakfast.    . guaiFENesin (MUCINEX) 600 MG 12 hr tablet Take 600 mg by mouth 2 (two) times daily as needed for cough. Reported on 12/22/2015    . loratadine (ALAVERT) 10 MG tablet Take 10 mg by mouth every other day as needed for allergies.    . metoprolol succinate (TOPROL-XL) 25 MG 24 hr tablet Take 6.25 mg by mouth daily.    Marland Kitchen MYRBETRIQ 50 MG TB24 tablet     . potassium chloride SA (K-DUR,KLOR-CON) 20 MEQ tablet Take 20 mEq by mouth daily.    . pravastatin (PRAVACHOL) 40 MG tablet Take 40 mg by mouth daily.    . vitamin B-12 (CYANOCOBALAMIN) 500 MCG tablet Take 500 mcg by mouth daily.    . Vitamin D, Ergocalciferol, (DRISDOL) 50000 UNITS CAPS capsule Take 50,000 Units by mouth once a week. Mondays    . ACCU-CHEK AVIVA PLUS test strip PATIENT TO CHECK SUGARS ONCE DAILY.  3  . Polyethyl Glycol-Propyl Glycol (SYSTANE) 0.4-0.3 % SOLN Apply 1 drop to eye 2 (two) times daily as needed (dry eyes).     No current facility-administered medications for this encounter.    Allergies  Allergen Reactions  . Pollen Extract     Sneezing, watery  eyes    Social History   Social History  . Marital Status: Unknown    Spouse Name: N/A  . Number of Children: N/A  . Years of Education: N/A   Occupational History  . Not on file.   Social History Main Topics  . Smoking status: Never Smoker   . Smokeless tobacco: Not on file  . Alcohol Use: No  . Drug Use: No  . Sexual Activity: Not on file   Other Topics Concern  . Not on file   Social History Narrative   Pt recently moved to [place] with daughter from [place]  Family History  Problem Relation Age of Onset  . Heart disease Father   . Diabetes Mother   . Thyroid disease Neg Hx     ROS- All systems are reviewed and negative except as per the HPI above  Physical Exam: Filed Vitals:   01/11/16 1151  BP: 110/50  Pulse: 55  Height: 5\' 6"  (1.676 m)  Weight: 211 lb 6.4 oz (95.89 kg)    GEN- The patient is well appearing, alert and oriented x 3 today.   Head- normocephalic, atraumatic Eyes-  Sclera clear, conjunctiva pink Ears- hearing intact Oropharynx- clear Neck- supple, no JVP Lymph- no cervical lymphadenopathy Lungs- Clear to ausculation bilaterally, normal work of breathing Heart- Slow, regular rate and rhythm, no murmurs, rubs or gallops, PMI not laterally displaced GI- soft, NT, ND, + BS Extremities- no clubbing, cyanosis, or edema MS- no significant deformity or atrophy Skin- no rash or lesion Psych- euthymic mood, full affect Neuro- strength and sensation are intact  EKG- sinus brady with first degree AVB with PAC's with aberrant conduction LBBB pr int 260 ms, qrs int 154 ms, qtc 455 ms Epic records reviewed Bmet-1.60 ms, BUN 27, glucose 173, gfr 32.55    Assessment and Plan: 1. PAF Maintaining SR Continue metoprolol at 6.25 mg daily, pt has tendency toward bradycardia, tolerating well. Continue asa per family's wishes, understanding that asa does not protect against stroke per guidelines, but GI bleed in the past on warfarin.  2.  Shortness of breath Improved with increase of diuretic to 60 mg daily If daughter thinks pt is tending toward dry, can try alternating 40 mg with 60 mg, she recently tried 40 mg a day but found she was more winded with exertion and abdomen appeared larger, so she recently increased back to 60 mg a day.  3. Hyperthyroidism Improved with treatment  F/u with Endocrinologist  F/u with 3 months  Anna [name], [facility] [address] [place], [place] [address] [phone]

## 2016-01-11 NOTE — Progress Notes (Signed)
Patient ID: Anna [name], female   DOB: 08-14-1929, 82 y.o.   MRN: [id number]                                                                                                               Reason for Appointment:  follow-up for thyroid     History of Present Illness:   The patient had been on amiodarone for her atrial fibrillation for 1-1-1/2 years prior to her moving to [place] from [place] in June 2016.  When she was admitted to the hospital in 6/16 for dyspnea, CHF and renal insufficiency she was found to be hyperthyroid with significantly high free T4 level.  She was not treated for this but her amiodarone was stopped  On her initial consultation patient has been noticed to have fatigue, sleepiness, lack of motivation, shortness of breath on exertion and decreased appetite along with weight loss She had follow-up thyroid levels on 08/27/15 during her repeat hospitalization and was found to be hyperthyroid  She was started on methimazole 10 mg daily  The dose was increased to twice a day in 2/17 when her free T4 was still high along with high normal free T3 With this she was subjectively improved and was much more alert and interactive  Since late March her thyroid levels have been getting relatively lower and her methimazole dose has been progressively reduced She appears to get more lethargic when she has had a decline in her free T4 levels  Her methimazole was stopped on 4/29 when she was starting to get a little more lethargic and her free T4 level had gone down to 0.87.  Her daughter said that she has less tired and more interactive  She has chronic cold intolerance   Her thyroid function tests showed the following:     Lab Results  Component Value Date   FREET4 0.88 01/08/2016   FREET4 0.87 12/17/2015   FREET4 1.01 11/26/2015   TSH 0.48 01/08/2016   TSH 0.67 12/17/2015   TSH 0.09* 11/26/2015   . Lab Results  Component Value Date   T3FREE 2.9 12/17/2015   T3FREE  3.0 11/26/2015   T3FREE 3.1 11/03/2015        Medication List       This list is accurate as of: 01/11/16  9:23 AM.  Always use your most recent med list.               ACCU-CHEK AVIVA PLUS test strip  Generic drug:  glucose blood  PATIENT TO CHECK SUGARS ONCE DAILY.     acetaminophen 500 MG tablet  Commonly known as:  TYLENOL  Take 500 mg by mouth every 6 (six) hours as needed for fever.     ALAVERT 10 MG tablet  Generic drug:  loratadine  Take 10 mg by mouth every other day as needed for allergies.     amLODipine 5 MG tablet  Commonly known as:  NORVASC  Take 1 tablet by mouth daily.     aspirin 325  MG tablet  Take 325 mg by mouth daily.     calcitRIOL 0.5 MCG capsule  Commonly known as:  ROCALTROL  Take 0.5 mcg by mouth 3 (three) times a week. Monday, Wednesday, and Friday only     docusate sodium 100 MG capsule  Commonly known as:  COLACE  Take 100 mg by mouth daily as needed for mild constipation.     donepezil 10 MG tablet  Commonly known as:  ARICEPT  Take 10 mg by mouth at bedtime.     fluticasone 50 MCG/ACT nasal spray  Commonly known as:  FLONASE  Place 2 sprays into both nostrils daily.     furosemide 20 MG tablet  Commonly known as:  LASIX  Take 3 tablets (60 mg total) by mouth daily.     glimepiride 4 MG tablet  Commonly known as:  AMARYL  Take 4 mg by mouth daily with breakfast.     guaiFENesin 600 MG 12 hr tablet  Commonly known as:  MUCINEX  Take 600 mg by mouth 2 (two) times daily as needed for cough. Reported on 12/22/2015     metoprolol succinate 25 MG 24 hr tablet  Commonly known as:  TOPROL-XL  Take 6.25 mg by mouth daily.     MYRBETRIQ 50 MG Tb24 tablet  Generic drug:  mirabegron ER     potassium chloride SA 20 MEQ tablet  Commonly known as:  K-DUR,KLOR-CON  Take 20 mEq by mouth daily.     pravastatin 40 MG tablet  Commonly known as:  PRAVACHOL  Take 40 mg by mouth daily.     SYSTANE 0.4-0.3 % Soln  Generic drug:   Polyethyl Glycol-Propyl Glycol  Apply 1 drop to eye 2 (two) times daily as needed (dry eyes).     vitamin B-12 500 MCG tablet  Commonly known as:  CYANOCOBALAMIN  Take 500 mcg by mouth daily.     Vitamin D (Ergocalciferol) 50000 units Caps capsule  Commonly known as:  DRISDOL  Take 50,000 Units by mouth once a week. Mondays            Past Medical History  Diagnosis Date  . CHF (congestive heart failure) ([place])   . Persistent atrial fibrillation ([place])   . Chronic renal insufficiency, stage III (moderate)   . Diabetes mellitus without complication ([place])   . Hypertension   . Dementia   . LBBB (left bundle branch block)   . Overactive bladder   . GI bleeding 11/2013    due to supratherapeutic INR  . Hyperlipidemia   . Vitamin D deficiency   . Vitamin B12 deficiency   . Hyperthyroidism     on amiodarone  . Overweight     Past Surgical History  Procedure Laterality Date  . Abdominal hysterectomy      Family History  Problem Relation Age of Onset  . Heart disease Father   . Diabetes Mother   . Thyroid disease Neg Hx     Social History:  reports that she has never smoked. She does not have any smokeless tobacco history on file. She reports that she does not drink alcohol or use illicit drugs.  Allergies:  Allergies  Allergen Reactions  . Pollen Extract     Sneezing, watery eyes      Review of Systems  DIABETES: Currently being managed with Amaryl.  Last A1c 6.1   Examination:   BP 112/64 mmHg  Pulse 56  Temp(Src) 97.8 F (36.6 C)  Resp 16  Ht 5\' 6"  (1.676 m)  Wt 202 lb 3.2 oz (91.717 kg)  BMI 32.65 kg/m2  SpO2 95%   General Appearance: She Is alert although not interacting as much or communicating Thyroid not  palpable but she is not cooperating with swallowing Neurological: Deep tendon reflexes at biceps show normal relaxation   Assessment/Plan:   Hyperthyroidism, from amiodarone which was previously long-standing before starting treatment and  symptomatic Baseline abnormality was mostly a high free T4 level  Her thyroid levels after stopping methimazole about 3 weeks ago are very stable with unchanged free T4 Her TSH is low normal but not in the abnormal range She may possibly have a mildly autonomous thyroid at baseline  Her Hyperthyroidism likely to be in remission now  We will have her stay off the methimazole and have her come back in 4 weeks for follow-up   Mattie Novosel 01/11/2016, 9:23 AM

## 2016-01-12 DIAGNOSIS — Z87440 Personal history of urinary (tract) infections: Secondary | ICD-10-CM | POA: Diagnosis not present

## 2016-01-12 DIAGNOSIS — N3281 Overactive bladder: Secondary | ICD-10-CM | POA: Diagnosis not present

## 2016-01-12 DIAGNOSIS — Z0000 Encounter for general adult medical examination without abnormal findings: Secondary | ICD-10-CM | POA: Diagnosis not present

## 2016-01-13 ENCOUNTER — Other Ambulatory Visit: Payer: Commercial Managed Care - HMO

## 2016-02-05 ENCOUNTER — Encounter (HOSPITAL_COMMUNITY): Payer: Self-pay | Admitting: *Deleted

## 2016-02-05 ENCOUNTER — Emergency Department (HOSPITAL_COMMUNITY): Payer: Commercial Managed Care - HMO

## 2016-02-05 ENCOUNTER — Observation Stay (HOSPITAL_COMMUNITY)
Admission: EM | Admit: 2016-02-05 | Discharge: 2016-02-09 | Disposition: A | Discharge status: Home / Self Care | Payer: Commercial Managed Care - HMO | Attending: Internal Medicine | Admitting: Internal Medicine

## 2016-02-05 ENCOUNTER — Observation Stay (HOSPITAL_COMMUNITY): Payer: Commercial Managed Care - HMO

## 2016-02-05 DIAGNOSIS — Z6836 Body mass index (BMI) 36.0-36.9, adult: Secondary | ICD-10-CM | POA: Diagnosis not present

## 2016-02-05 DIAGNOSIS — E118 Type 2 diabetes mellitus with unspecified complications: Secondary | ICD-10-CM | POA: Diagnosis not present

## 2016-02-05 DIAGNOSIS — I480 Paroxysmal atrial fibrillation: Secondary | ICD-10-CM | POA: Diagnosis not present

## 2016-02-05 DIAGNOSIS — I10 Essential (primary) hypertension: Secondary | ICD-10-CM | POA: Diagnosis present

## 2016-02-05 DIAGNOSIS — R0602 Shortness of breath: Secondary | ICD-10-CM | POA: Diagnosis not present

## 2016-02-05 DIAGNOSIS — Z7951 Long term (current) use of inhaled steroids: Secondary | ICD-10-CM | POA: Insufficient documentation

## 2016-02-05 DIAGNOSIS — E1122 Type 2 diabetes mellitus with diabetic chronic kidney disease: Secondary | ICD-10-CM | POA: Diagnosis not present

## 2016-02-05 DIAGNOSIS — E559 Vitamin D deficiency, unspecified: Secondary | ICD-10-CM | POA: Insufficient documentation

## 2016-02-05 DIAGNOSIS — N3281 Overactive bladder: Secondary | ICD-10-CM | POA: Diagnosis not present

## 2016-02-05 DIAGNOSIS — N183 Chronic kidney disease, stage 3 unspecified: Secondary | ICD-10-CM | POA: Diagnosis present

## 2016-02-05 DIAGNOSIS — R42 Dizziness and giddiness: Secondary | ICD-10-CM | POA: Insufficient documentation

## 2016-02-05 DIAGNOSIS — I481 Persistent atrial fibrillation: Secondary | ICD-10-CM | POA: Diagnosis not present

## 2016-02-05 DIAGNOSIS — F0390 Unspecified dementia without behavioral disturbance: Secondary | ICD-10-CM | POA: Insufficient documentation

## 2016-02-05 DIAGNOSIS — R4182 Altered mental status, unspecified: Secondary | ICD-10-CM | POA: Diagnosis not present

## 2016-02-05 DIAGNOSIS — Z79899 Other long term (current) drug therapy: Secondary | ICD-10-CM | POA: Insufficient documentation

## 2016-02-05 DIAGNOSIS — Z7984 Long term (current) use of oral hypoglycemic drugs: Secondary | ICD-10-CM | POA: Diagnosis not present

## 2016-02-05 DIAGNOSIS — E538 Deficiency of other specified B group vitamins: Secondary | ICD-10-CM | POA: Diagnosis not present

## 2016-02-05 DIAGNOSIS — R55 Syncope and collapse: Secondary | ICD-10-CM | POA: Diagnosis not present

## 2016-02-05 DIAGNOSIS — I447 Left bundle-branch block, unspecified: Secondary | ICD-10-CM | POA: Diagnosis not present

## 2016-02-05 DIAGNOSIS — E669 Obesity, unspecified: Secondary | ICD-10-CM | POA: Diagnosis not present

## 2016-02-05 DIAGNOSIS — Z7982 Long term (current) use of aspirin: Secondary | ICD-10-CM | POA: Insufficient documentation

## 2016-02-05 DIAGNOSIS — R079 Chest pain, unspecified: Secondary | ICD-10-CM

## 2016-02-05 DIAGNOSIS — I5032 Chronic diastolic (congestive) heart failure: Secondary | ICD-10-CM | POA: Insufficient documentation

## 2016-02-05 DIAGNOSIS — T462X5A Adverse effect of other antidysrhythmic drugs, initial encounter: Secondary | ICD-10-CM | POA: Diagnosis not present

## 2016-02-05 DIAGNOSIS — I130 Hypertensive heart and chronic kidney disease with heart failure and stage 1 through stage 4 chronic kidney disease, or unspecified chronic kidney disease: Secondary | ICD-10-CM | POA: Insufficient documentation

## 2016-02-05 DIAGNOSIS — E0590 Thyrotoxicosis, unspecified without thyrotoxic crisis or storm: Secondary | ICD-10-CM | POA: Diagnosis present

## 2016-02-05 DIAGNOSIS — I4819 Other persistent atrial fibrillation: Secondary | ICD-10-CM | POA: Diagnosis present

## 2016-02-05 DIAGNOSIS — E785 Hyperlipidemia, unspecified: Secondary | ICD-10-CM | POA: Diagnosis not present

## 2016-02-05 DIAGNOSIS — E0580 Other thyrotoxicosis without thyrotoxic crisis or storm: Secondary | ICD-10-CM | POA: Insufficient documentation

## 2016-02-05 DIAGNOSIS — R7989 Other specified abnormal findings of blood chemistry: Secondary | ICD-10-CM

## 2016-02-05 DIAGNOSIS — E1129 Type 2 diabetes mellitus with other diabetic kidney complication: Secondary | ICD-10-CM | POA: Diagnosis present

## 2016-02-05 DIAGNOSIS — R531 Weakness: Secondary | ICD-10-CM

## 2016-02-05 LAB — COMPREHENSIVE METABOLIC PANEL
ALBUMIN: 3.5 g/dL (ref 3.5–5.0)
ALK PHOS: 83 U/L (ref 38–126)
ALT: 11 U/L — ABNORMAL LOW (ref 14–54)
AST: 16 U/L (ref 15–41)
Anion gap: 10 (ref 5–15)
BILIRUBIN TOTAL: 0.7 mg/dL (ref 0.3–1.2)
BUN: 21 mg/dL — AB (ref 6–20)
CALCIUM: 9.3 mg/dL (ref 8.9–10.3)
CO2: 23 mmol/L (ref 22–32)
CREATININE: 1.78 mg/dL — AB (ref 0.44–1.00)
Chloride: 109 mmol/L (ref 101–111)
GFR calc Af Amer: 29 mL/min — ABNORMAL LOW (ref >60)
GFR, EST NON AFRICAN AMERICAN: 25 mL/min — AB (ref >60)
GLUCOSE: 218 mg/dL — AB (ref 65–99)
Potassium: 3.9 mmol/L (ref 3.5–5.1)
Sodium: 142 mmol/L (ref 135–145)
TOTAL PROTEIN: 6.2 g/dL — AB (ref 6.5–8.1)

## 2016-02-05 LAB — I-STAT TROPONIN, ED
TROPONIN I, POC: 0.01 ng/mL (ref 0.00–0.08)
TROPONIN I, POC: 0.01 ng/mL (ref 0.00–0.08)

## 2016-02-05 LAB — URINALYSIS, ROUTINE W REFLEX MICROSCOPIC
Bilirubin Urine: NEGATIVE
GLUCOSE, UA: NEGATIVE mg/dL
Hgb urine dipstick: NEGATIVE
Ketones, ur: NEGATIVE mg/dL
LEUKOCYTES UA: NEGATIVE
Nitrite: NEGATIVE
PH: 6 (ref 5.0–8.0)
PROTEIN: NEGATIVE mg/dL
SPECIFIC GRAVITY, URINE: 1.016 (ref 1.005–1.030)

## 2016-02-05 LAB — CBC
HEMATOCRIT: 39.2 % (ref 36.0–46.0)
Hemoglobin: 11.6 g/dL — ABNORMAL LOW (ref 12.0–15.0)
MCH: 25.4 pg — ABNORMAL LOW (ref 26.0–34.0)
MCHC: 29.6 g/dL — AB (ref 30.0–36.0)
MCV: 86 fL (ref 78.0–100.0)
PLATELETS: 202 10*3/uL (ref 150–400)
RBC: 4.56 MIL/uL (ref 3.87–5.11)
RDW: 14 % (ref 11.5–15.5)
WBC: 6.5 10*3/uL (ref 4.0–10.5)

## 2016-02-05 LAB — PROTIME-INR
INR: 1.1 (ref 0.00–1.49)
Prothrombin Time: 14.4 seconds (ref 11.6–15.2)

## 2016-02-05 LAB — TSH: TSH: 0.302 u[IU]/mL — ABNORMAL LOW (ref 0.350–4.500)

## 2016-02-05 LAB — MAGNESIUM: Magnesium: 2.1 mg/dL (ref 1.7–2.4)

## 2016-02-05 LAB — CBG MONITORING, ED: Glucose-Capillary: 193 mg/dL — ABNORMAL HIGH (ref 65–99)

## 2016-02-05 LAB — GLUCOSE, CAPILLARY
GLUCOSE-CAPILLARY: 141 mg/dL — AB (ref 65–99)
GLUCOSE-CAPILLARY: 129 mg/dL — AB (ref 65–99)

## 2016-02-05 LAB — I-STAT CG4 LACTIC ACID, ED: Lactic Acid, Venous: 2.93 mmol/L (ref 0.5–2.0)

## 2016-02-05 LAB — D-DIMER, QUANTITATIVE: D-Dimer, Quant: 2.28 ug/mL-FEU — ABNORMAL HIGH (ref 0.00–0.50)

## 2016-02-05 LAB — LACTIC ACID, PLASMA
LACTIC ACID, VENOUS: 2 mmol/L (ref 0.5–2.0)
LACTIC ACID, VENOUS: 1.3 mmol/L (ref 0.5–2.0)

## 2016-02-05 LAB — BRAIN NATRIURETIC PEPTIDE: B Natriuretic Peptide: 117.7 pg/mL — ABNORMAL HIGH (ref 0.0–100.0)

## 2016-02-05 MED ORDER — SODIUM CHLORIDE 0.9 % IV SOLN: INTRAVENOUS | Status: DC

## 2016-02-05 MED ORDER — FUROSEMIDE 40 MG PO TABS: 60.0000 mg | ORAL_TABLET | Freq: Every day | ORAL | Status: DC

## 2016-02-05 MED ORDER — AMLODIPINE BESYLATE 5 MG PO TABS: 5.0000 mg | ORAL_TABLET | Freq: Every day | ORAL | Status: DC

## 2016-02-05 MED ORDER — INSULIN ASPART 100 UNIT/ML ~~LOC~~ SOLN
0.0000 [IU] | Freq: Three times a day (TID) | SUBCUTANEOUS | Status: DC
  Administered 2016-02-05: 1 [IU] via SUBCUTANEOUS
  Administered 2016-02-06: 5 [IU] via SUBCUTANEOUS
  Administered 2016-02-06: 2 [IU] via SUBCUTANEOUS

## 2016-02-05 MED ORDER — TECHNETIUM TC 99M DIETHYLENETRIAME-PENTAACETIC ACID: 30.5000 | Freq: Once | INTRAVENOUS | Status: DC | PRN

## 2016-02-05 MED ORDER — PRAVASTATIN SODIUM 40 MG PO TABS
40.0000 mg | ORAL_TABLET | Freq: Every day | ORAL | Status: DC
  Administered 2016-02-05 – 2016-02-09 (×5): 40 mg via ORAL
  Filled 2016-02-05 (×5): qty 1

## 2016-02-05 MED ORDER — DONEPEZIL HCL 10 MG PO TABS
10.0000 mg | ORAL_TABLET | Freq: Every day | ORAL | Status: DC
  Administered 2016-02-05 – 2016-02-08 (×4): 10 mg via ORAL
  Filled 2016-02-05 (×4): qty 1

## 2016-02-05 MED ORDER — ASPIRIN 81 MG PO CHEW: 324.0000 mg | CHEWABLE_TABLET | Freq: Once | ORAL | Status: DC

## 2016-02-05 MED ORDER — TECHNETIUM TO 99M ALBUMIN AGGREGATED
4.0000 | Freq: Once | INTRAVENOUS | Status: AC | PRN
  Administered 2016-02-05: 4 via INTRAVENOUS

## 2016-02-05 MED ORDER — ONDANSETRON HCL 4 MG/2ML IJ SOLN: 4.0000 mg | Freq: Three times a day (TID) | INTRAMUSCULAR | Status: AC | PRN

## 2016-02-05 MED ORDER — ASPIRIN 325 MG PO TABS
325.0000 mg | ORAL_TABLET | Freq: Every day | ORAL | Status: DC
  Administered 2016-02-06 – 2016-02-09 (×4): 325 mg via ORAL
  Filled 2016-02-05 (×4): qty 1

## 2016-02-05 MED ORDER — PROMETHAZINE HCL 25 MG PO TABS: 12.5000 mg | ORAL_TABLET | Freq: Four times a day (QID) | ORAL | Status: DC | PRN

## 2016-02-05 NOTE — Progress Notes (Signed)
1700 came in from ED with daughter .Marland Kitchen Pt awake alert , follows verbal commands with limited answer to queries . SPEECH CLEAR Kept comfortable in bed . Safety precautions observed orientation done to pt and daughter

## 2016-02-05 NOTE — ED Notes (Signed)
CBG is 193.

## 2016-02-05 NOTE — H&P (Signed)
History and Physical    Anna [name] [id number] DOB: 03/17/1929 DOA: 02/05/2016   PCP: Haywood Pao, MD   Patient coming from:  Home SNF  Rehab   Chief Complaint:  HPI: Anna [name] is a 82 y.o. female with medical history significant for Afib, A fib not on anticoagulants due to h/o GIB, CKD st 3, HTN, LBBB, hyperthyroidism, DM,CHF, HLD, and recent URI treated with azithromycin,  brought to the ED by EMS after she suffered acute episode of chest pain  near syncope as she was getting up to go to the bathroom. On transit she was given ASA 324 mg and 1 NTG with chest pain relief. On arrival her Osats were 16, continued on 2l [place] and then O2 dc's as she is on 98 RA.  Chest Pain  was accompanied by diaphoresis without  Radiation,  6/10, pounding in nature. Pain notworsened with deep inspiration, movement or exertion. Since admission she is pain free. Reports some  cough. Denies any fever or chills. Denies any nausea, vomiting or abdominal pain. Appetite is normal and eats healthy foods  Denies any leg swelling or calf pain. Denies any headaches or vision changes. Denies any seizures. Currently her mental status is at her normal baseline. Never seen by a cardiologist     At the ED,patient received ASA and Nitroglycerin. EKG shows NSR with non specific ST and TW abnormality, new since last EKG . QTC . CMET and CBC are essentially normal. Troponins are negative to date, 0.01. BNP at  ,Chest x-ray shows    ED Course:  BP 137/73 mmHg  Pulse 133  Temp(Src) 98 F (36.7 C) (Rectal)  Resp 14  SpO2 93%  Echo:last echocardiogram 1/17 with mildly reduced  LVF, EF 45% Gr  1DD. BNP is 117.7 EKG is NSR with chronic LBBB, 1st degree AVB, and multiple PVCs.  CXR shows chronic prominent lung markings but is otherwise unremarkable. DDmer 2.28   Troponin is 0.01. CBG is 193. Lactic acid is 2.93 however CBC shows WBC of 6.5. BMP is remarkable for BUN/SCr of 21/1.78 which is slightly elevated from  baseline.  UA is normal  VQ scan is pending CT head is negative for acute findings  Review of Systems: As per HPI otherwise 10 point review of systems negative.   Past Medical History  Diagnosis Date  . CHF (congestive heart failure) ([place])   . Persistent atrial fibrillation ([place])   . Chronic renal insufficiency, stage III (moderate)   . Diabetes mellitus without complication ([place])   . Hypertension   . Dementia   . LBBB (left bundle branch block)   . Overactive bladder   . GI bleeding 11/2013    due to supratherapeutic INR  . Hyperlipidemia   . Vitamin D deficiency   . Vitamin B12 deficiency   . Hyperthyroidism     on amiodarone  . Overweight      Past Surgical History  Procedure Laterality Date  . Abdominal hysterectomy      Social History Social History   Social History  . Marital Status: Unknown    Spouse Name: N/A  . Number of Children: N/A  . Years of Education: N/A   Occupational History  . Not on file.   Social History Main Topics  . Smoking status: Never Smoker   . Smokeless tobacco: Not on file  . Alcohol Use: No  . Drug Use: No  . Sexual Activity: Not on file   Other Topics Concern  .  Not on file   Social History Narrative   Pt recently moved to [place] with daughter from [place]  . Pollen Extract     Sneezing, watery eyes    Family History  Problem Relation Age of Onset  . Heart disease Father   . Diabetes Mother   . Thyroid disease Neg Hx       Prior to Admission medications   Medication Sig Start Date End Date Taking? Authorizing Provider  ACCU-CHEK AVIVA PLUS test strip PATIENT TO CHECK SUGARS ONCE DAILY. 11/19/15  Yes Historical Provider, MD  acetaminophen (TYLENOL) 500 MG tablet Take 500 mg by mouth every 6 (six) hours as needed for fever.    Yes Historical Provider, MD  amLODipine (NORVASC) 5 MG tablet Take 5 mg by mouth daily.  01/26/15  Yes Historical Provider, MD  aspirin 325 MG  tablet Take 325 mg by mouth daily.   Yes Historical Provider, MD  calcitRIOL (ROCALTROL) 0.5 MCG capsule Take 0.5 mcg by mouth 3 (three) times a week. Monday, Wednesday, and Friday only 01/07/15  Yes Historical Provider, MD  docusate sodium (COLACE) 100 MG capsule Take 100 mg by mouth daily as needed for mild constipation.   Yes Historical Provider, MD  donepezil (ARICEPT) 10 MG tablet Take 10 mg by mouth at bedtime.   Yes Historical Provider, MD  fluticasone (FLONASE) 50 MCG/ACT nasal spray Place 2 sprays into both nostrils daily.   Yes Historical Provider, MD  furosemide (LASIX) 20 MG tablet Take 3 tablets (60 mg total) by mouth daily. 08/31/15  Yes Robbie Lis, MD  glimepiride (AMARYL) 4 MG tablet Take 4 mg by mouth daily with breakfast.   Yes Historical Provider, MD  guaiFENesin (MUCINEX) 600 MG 12 hr tablet Take 600 mg by mouth 2 (two) times daily as needed for cough. Reported on 12/22/2015   Yes Historical Provider, MD  loratadine (ALAVERT) 10 MG tablet Take 10 mg by mouth every other day as needed for allergies.   Yes Historical Provider, MD  metoprolol succinate (TOPROL-XL) 25 MG 24 hr tablet Take 6.25 mg by mouth daily.   Yes Historical Provider, MD  MYRBETRIQ 50 MG TB24 tablet Take 50 mg by mouth daily.  11/23/15  Yes Historical Provider, MD  Polyethyl Glycol-Propyl Glycol (SYSTANE) 0.4-0.3 % SOLN Apply 1 drop to eye 2 (two) times daily as needed (dry eyes).   Yes Historical Provider, MD  potassium chloride SA (K-DUR,KLOR-CON) 20 MEQ tablet Take 20 mEq by mouth daily.   Yes Historical Provider, MD  pravastatin (PRAVACHOL) 40 MG tablet Take 40 mg by mouth daily.   Yes Historical Provider, MD  vitamin B-12 (CYANOCOBALAMIN) 500 MCG tablet Take 500 mcg by mouth daily.   Yes Historical Provider, MD  Vitamin D, Ergocalciferol, (DRISDOL) 50000 UNITS CAPS capsule Take 50,000 Units by mouth once a week. Mondays 01/07/15  Yes Historical Provider, MD    Physical Exam:    Filed Vitals:   02/05/16 1253  02/05/16 1400 02/05/16 1500 02/05/16 1535  BP:  140/53 129/51 137/73  Pulse:  64 133   Temp: 98 F (36.7 C)     TempSrc: Rectal     Resp:  17 19 14   SpO2:  98% 95% 93%      Constitutional: NAD, calm, comfortable Filed Vitals:   02/05/16 1253 02/05/16 1400 02/05/16 1500 02/05/16 1535  BP:  140/53 129/51 137/73  Pulse:  64 133   Temp:  98 F (36.7 C)     TempSrc: Rectal     Resp:  17 19 14   SpO2:  98% 95% 93%   Eyes: PERRL, lids and conjunctivae normal ENMT: Mucous membranes are moist. Posterior pharynx clear of any exudate or lesions.Normal dentition.  Neck: normal, supple, no masses, no thyromegaly Respiratory: clear to auscultation bilaterally, no wheezing, no crackles. Normal respiratory effort. No accessory muscle use.  Cardiovascular: irrgular rate and rhythm, no murmurs / rubs / gallops. No extremity edema. 2+ pedal pulses. No carotid bruits.  Abdomen: no tenderness, no masses palpated. No hepatosplenomegaly. Bowel sounds positive.  Musculoskeletal: no clubbing / cyanosis. No joint deformity upper and lower extremities.  Skin: no rashes, lesions, ulcers.  Neurologic: CN 2-12 grossly intact. Knows place, self, not to time and date Sensation intact, DTR normal. Strength 5/5 in all 4.      Labs on Admission: I have personally reviewed following labs and imaging studies  CBC:  Recent Labs Lab 02/05/16 1205  WBC 6.5  HGB 11.6*  HCT 39.2  MCV 86.0  PLT 123XX123    Basic Metabolic Panel:  Recent Labs Lab 02/05/16 1205  NA 142  K 3.9  CL 109  CO2 23  GLUCOSE 218*  BUN 21*  CREATININE 1.78*  CALCIUM 9.3  MG 2.1    GFR: CrCl cannot be calculated (Unknown ideal weight.).  Liver Function Tests:  Recent Labs Lab 02/05/16 1205  AST 16  ALT 11*  ALKPHOS 83  BILITOT 0.7  PROT 6.2*  ALBUMIN 3.5   No results for input(s): LIPASE, AMYLASE in the last 168 hours. No results for input(s): AMMONIA in the last 168 hours.  Coagulation Profile:  Recent  Labs Lab 02/05/16 1205  INR 1.10    Cardiac Enzymes: No results for input(s): CKTOTAL, CKMB, CKMBINDEX, TROPONINI in the last 168 hours.  BNP (last 3 results) No results for input(s): PROBNP in the last 8760 hours.  HbA1C: No results for input(s): HGBA1C in the last 72 hours.  CBG:  Recent Labs Lab 02/05/16 1236  GLUCAP 193*    Lipid Profile: No results for input(s): CHOL, HDL, LDLCALC, TRIG, CHOLHDL, LDLDIRECT in the last 72 hours.  Thyroid Function Tests: No results for input(s): TSH, T4TOTAL, FREET4, T3FREE, THYROIDAB in the last 72 hours.  Anemia Panel: No results for input(s): VITAMINB12, FOLATE, FERRITIN, TIBC, IRON, RETICCTPCT in the last 72 hours.  Urine analysis:    Component Value Date/Time   COLORURINE YELLOW 02/05/2016 [place] 02/05/2016 1252   LABSPEC 1.016 02/05/2016 1252   PHURINE 6.0 02/05/2016 1252   GLUCOSEU NEGATIVE 02/05/2016 1252   HGBUR NEGATIVE 02/05/2016 1252   BILIRUBINUR NEGATIVE 02/05/2016 1252   KETONESUR NEGATIVE 02/05/2016 1252   PROTEINUR NEGATIVE 02/05/2016 1252   UROBILINOGEN 0.2 01/27/2015 1353   NITRITE NEGATIVE 02/05/2016 1252   LEUKOCYTESUR NEGATIVE 02/05/2016 1252    Sepsis Labs: @LABRCNTIP (procalcitonin:4,lacticidven:4) )No results found for this or any previous visit (from the past 240 hour(s)).   Radiological Exams on Admission: Ct Head Wo Contrast  02/05/2016  CLINICAL DATA:  Altered mental status.  Chronic dementia EXAM: CT HEAD WITHOUT CONTRAST TECHNIQUE: Contiguous axial images were obtained from the base of the skull through the vertex without intravenous contrast. COMPARISON:  None. FINDINGS: There is mild diffuse atrophy. There is no intracranial mass hemorrhage, extra-axial fluid collection, or midline shift. There is mild patchy small vessel disease in the centra semiovale bilaterally. Elsewhere, gray-white compartments appear normal. No acute infarct evident.  The bony calvarium appears intact.  The mastoid air cells are clear. No intraorbital lesions are evident. There is apparent cerumen in the right external auditory canal. IMPRESSION: Mild atrophy with patchy periventricular small vessel disease. No intracranial mass, hemorrhage, or evidence of acute infarct. There is cerumen in the external auditory canal. Electronically Signed   By: Lowella Grip III M.D.   On: 02/05/2016 12:33   Dg Chest Portable 1 View  02/05/2016  CLINICAL DATA:  Chest pain and shortness of breath. EXAM: PORTABLE CHEST 1 VIEW COMPARISON:  08/26/2015 FINDINGS: Lung markings are mildly prominent but appear chronic. No focal airspace disease or pulmonary edema. Heart and mediastinum are within normal limits. The trachea is midline. Negative for a pneumothorax. No acute bone abnormality. Subtle linear density in the left -lower chest appears stable. IMPRESSION: No active disease. Electronically Signed   By: Markus Daft M.D.   On: 02/05/2016 12:12    EKG: Independently reviewed.  Assessment/Plan Principal Problem:   Pre-syncope Active Problems:   Persistent atrial fibrillation (HCC)   Benign essential HTN   DM (diabetes mellitus) type II controlled with renal manifestation (HCC)   CKD (chronic kidney disease), stage III   Hyperthyroidism   Generalized weakness   Near syncope    Acute Presyncopal episode with chest pain in the setting of  atrial fibrillation, overdiuresis, hyperglycemia, recent infection. Patient with a history of Afib not on anticoagulants, CHF. She is on . Home ASA.  Chest pain subsided with NTG and ASA. Unlikely sepsis, but will r/o. EKG without ACS, Tn neg, DDmer 2.28, UA normal, LA 2.93 . Afebrile. CBG 193   Initially hypotensive 91/62, now improved with IVF. Heart Score 5. ChadsVasc 7 Telemetry, observation - Meclizine when necessary dizziness - Half-normal saline 75 mL per hour - Hold beta blocker if BP less 100/55 - orthostatics Cycle troponin EKG in am continue ASA, O2 and NTG as  needed Follow VQ scan  Hold Lasix for now    Type II Diabetes Current blood sugar level is 193 No results found for: HGBA1C Hgb A1C Hold home oral diabetic medications.  SSI Heart healthy carb modified diet.  Chronic diastolic heart failure, last echocardiogram 1/17 with mildly reduced  LVF, EF 45% Gr  1DD. Dry weight 95 kg , current weight 95.89. Appears compensated.  BNP 117 - Careful use of IVF - Hold diuretics for now - Daily weights, I/O   Hyperthyroidism. EKG without ACS. Last TSH 12/2015 0.48. Chest pain free at this time, however, due to her history described above, will check TSH No other acute interventions at this time  Chronic kidney disease stage 3 Cr bl at 1.6, currently at 1.78 in the setting of overdiuresis  Gentle IVF Hold Lasix for now Repeat CMET in am   Dementia  Continue Namenda   DVT prophylaxis:  SCDS  Code Status:   Full     Family Communication:  Discussed with patient wife husband  Disposition Plan: Expect patient to be discharged to home after condition improves Consults called:    None Admission status:Tele  Obs    Sherion Dooly E, PA-C [facility]   If 7PM-7AM, please contact night-coverage [email or web address] Password [facility]  02/05/2016, 4:34 PM

## 2016-02-05 NOTE — ED Notes (Signed)
Pt arrives from home via [facility]. Pt daughter called with c/o cp and lethargy. Upon arrival pt was cool, pale and diaphoretic. Pt has had EKG changes and has a LBBB, AFIB, LVH and PVC's. Pt received 324mg  of ASA and 1 nitro PTA. The nitro did relieve CP.

## 2016-02-05 NOTE — ED Provider Notes (Signed)
CSN: [id number]     Arrival date & time 02/05/16  1129 History   First MD Initiated Contact with Patient 02/05/16 1142     Chief Complaint  Patient presents with  . Chest Pain    HPI Comments: 82 year old female presents with altered mental status and chest pain since this morning. She lives at home with her daughter and has baseline dementia. Past medical history significant for paroxysmal A. fib currently rate controlled with a BB and not anticoagulated (because of supratherapeutic INR, hx of GIB and falls), chronic SOB, left bundle branch block, LVH, CHF, hypertension, hyperlipidemia, CKD stage III, DM, and hyperthyroidism due to Amiodarone. History is provided by the daughter. She states that her mother was going to the bathroom this morning and as they were getting her dressed she had acute onset of weakness and unresponsiveness. EMS was called and patient was given 324 mg of aspirin and 1 nitroglycerin which did relieve her chest pain. She has been treated for a cough and fever 2 weeks ago with azithromycin. The fever has resolved however the cough has been ongoing. She is on an ASA daily. Echo 01/28/15 revealed EF 55%, mild LVH, LA size 41 mm, no significant arrhythmias.  Patient is a 82 y.o. female presenting with chest pain.  Chest Pain   Past Medical History  Diagnosis Date  . CHF (congestive heart failure) ([place])   . Persistent atrial fibrillation ([place])   . Chronic renal insufficiency, stage III (moderate)   . Diabetes mellitus without complication ([place])   . Hypertension   . Dementia   . LBBB (left bundle branch block)   . Overactive bladder   . GI bleeding 11/2013    due to supratherapeutic INR  . Hyperlipidemia   . Vitamin D deficiency   . Vitamin B12 deficiency   . Hyperthyroidism     on amiodarone  . Overweight    Past Surgical History  Procedure Laterality Date  . Abdominal hysterectomy     Family History  Problem Relation Age of Onset  . Heart disease Father   .  Diabetes Mother   . Thyroid disease Neg Hx    Social History  Substance Use Topics  . Smoking status: Never Smoker   . Smokeless tobacco: None  . Alcohol Use: No   OB History    No data available     Review of Systems  Unable to perform ROS: Dementia  Cardiovascular: Positive for chest pain.    Allergies  Pollen extract  Home Medications   Prior to Admission medications   Medication Sig Start Date End Date Taking? Authorizing Provider  ACCU-CHEK AVIVA PLUS test strip PATIENT TO CHECK SUGARS ONCE DAILY. 11/19/15  Yes Historical Provider, MD  acetaminophen (TYLENOL) 500 MG tablet Take 500 mg by mouth every 6 (six) hours as needed for fever.    Yes Historical Provider, MD  amLODipine (NORVASC) 5 MG tablet Take 5 mg by mouth daily.  01/26/15  Yes Historical Provider, MD  aspirin 325 MG tablet Take 325 mg by mouth daily.   Yes Historical Provider, MD  calcitRIOL (ROCALTROL) 0.5 MCG capsule Take 0.5 mcg by mouth 3 (three) times a week. Monday, Wednesday, and Friday only 01/07/15  Yes Historical Provider, MD  docusate sodium (COLACE) 100 MG capsule Take 100 mg by mouth daily as needed for mild constipation.   Yes Historical Provider, MD  donepezil (ARICEPT) 10 MG tablet Take 10 mg by mouth at bedtime.   Yes Historical Provider, MD  fluticasone (FLONASE) 50 MCG/ACT nasal spray Place 2 sprays into both nostrils daily.   Yes Historical Provider, MD  furosemide (LASIX) 20 MG tablet Take 3 tablets (60 mg total) by mouth daily. 08/31/15  Yes Robbie Lis, MD  glimepiride (AMARYL) 4 MG tablet Take 4 mg by mouth daily with breakfast.   Yes Historical Provider, MD  guaiFENesin (MUCINEX) 600 MG 12 hr tablet Take 600 mg by mouth 2 (two) times daily as needed for cough. Reported on 12/22/2015   Yes Historical Provider, MD  loratadine (ALAVERT) 10 MG tablet Take 10 mg by mouth every other day as needed for allergies.   Yes Historical Provider, MD  metoprolol succinate (TOPROL-XL) 25 MG 24 hr tablet Take  6.25 mg by mouth daily.   Yes Historical Provider, MD  MYRBETRIQ 50 MG TB24 tablet Take 50 mg by mouth daily.  11/23/15  Yes Historical Provider, MD  Polyethyl Glycol-Propyl Glycol (SYSTANE) 0.4-0.3 % SOLN Apply 1 drop to eye 2 (two) times daily as needed (dry eyes).   Yes Historical Provider, MD  potassium chloride SA (K-DUR,KLOR-CON) 20 MEQ tablet Take 20 mEq by mouth daily.   Yes Historical Provider, MD  pravastatin (PRAVACHOL) 40 MG tablet Take 40 mg by mouth daily.   Yes Historical Provider, MD  vitamin B-12 (CYANOCOBALAMIN) 500 MCG tablet Take 500 mcg by mouth daily.   Yes Historical Provider, MD  Vitamin D, Ergocalciferol, (DRISDOL) 50000 UNITS CAPS capsule Take 50,000 Units by mouth once a week. Mondays 01/07/15  Yes Historical Provider, MD   BP 130/56 mmHg  Pulse 68  Temp(Src) 97.7 F (36.5 C) (Axillary)  Resp 24  SpO2 100%   Physical Exam  Constitutional: She is oriented to person, place, and time. She appears well-developed and well-nourished. No distress.  Appears pale and is minimally responsive to questions  HENT:  Head: Normocephalic and atraumatic.  Eyes: Conjunctivae are normal. Pupils are equal, round, and reactive to light. Right eye exhibits no discharge. Left eye exhibits no discharge. No scleral icterus.  Neck: Normal range of motion.  Cardiovascular: Normal rate, S1 normal and S2 normal.  An irregular rhythm present. Exam reveals no gallop.   No murmur heard. Pulses:      Radial pulses are 2+ on the right side, and 2+ on the left side.  Pulmonary/Chest: Effort normal. No accessory muscle usage. No respiratory distress. She has no wheezes. She has rhonchi. She has no rales. She exhibits no tenderness.  Scattered rhonchi throughout lung fields  Abdominal: Soft. She exhibits no distension and no mass. There is no tenderness. There is no rebound and no guarding.  Neurological: She is alert and oriented to person, place, and time.  Skin: Skin is warm and dry. She is not  diaphoretic. There is pallor.  Cap refill is >2 sec, decreased skin turgor  Psychiatric: She has a normal mood and affect. Her behavior is normal.    ED Course  Procedures (including critical care time) Labs Review Labs Reviewed  CBC - Abnormal; Notable for the following:    Hemoglobin 11.6 (*)    MCH 25.4 (*)    MCHC 29.6 (*)    All other components within normal limits  D-DIMER, QUANTITATIVE (NOT AT [facility]) - Abnormal; Notable for the following:    D-Dimer, Quant 2.28 (*)    All other components within normal limits  I-STAT CG4 LACTIC ACID, ED - Abnormal; Notable for the following:    Lactic Acid, Venous 2.93 (*)  All other components within normal limits  PROTIME-INR  MAGNESIUM  BRAIN NATRIURETIC PEPTIDE  COMPREHENSIVE METABOLIC PANEL  URINALYSIS, ROUTINE W REFLEX MICROSCOPIC (NOT AT [facility])  I-STAT TROPOININ, ED    Imaging Review Ct Head Wo Contrast  02/05/2016  CLINICAL DATA:  Altered mental status.  Chronic dementia EXAM: CT HEAD WITHOUT CONTRAST TECHNIQUE: Contiguous axial images were obtained from the base of the skull through the vertex without intravenous contrast. COMPARISON:  None. FINDINGS: There is mild diffuse atrophy. There is no intracranial mass hemorrhage, extra-axial fluid collection, or midline shift. There is mild patchy small vessel disease in the centra semiovale bilaterally. Elsewhere, gray-white compartments appear normal. No acute infarct evident. The bony calvarium appears intact. The mastoid air cells are clear. No intraorbital lesions are evident. There is apparent cerumen in the right external auditory canal. IMPRESSION: Mild atrophy with patchy periventricular small vessel disease. No intracranial mass, hemorrhage, or evidence of acute infarct. There is cerumen in the external auditory canal. Electronically Signed   By: Lowella Grip III M.D.   On: 02/05/2016 12:33   Dg Chest Portable 1 View  02/05/2016  CLINICAL DATA:  Chest pain and shortness of  breath. EXAM: PORTABLE CHEST 1 VIEW COMPARISON:  08/26/2015 FINDINGS: Lung markings are mildly prominent but appear chronic. No focal airspace disease or pulmonary edema. Heart and mediastinum are within normal limits. The trachea is midline. Negative for a pneumothorax. No acute bone abnormality. Subtle linear density in the left -lower chest appears stable. IMPRESSION: No active disease. Electronically Signed   By: Markus Daft M.D.   On: 02/05/2016 12:12   I have personally reviewed and evaluated these images and lab results as part of my medical decision-making.   EKG Interpretation   Date/Time:  Friday February 05 2016 11:46:44 EDT Ventricular Rate:  97 PR Interval:  251 QRS Duration: 169 QT Interval:  421 QTC Calculation: 535 R Axis:   56 Text Interpretation:  Sinus rhythm Multiple premature complexes, vent &  supraven Prolonged PR interval Left bundle branch block Abnormal ekg  Confirmed by BEATON  MD, ROBERT ([id number]) on 02/05/2016 12:18:14 PM      MDM   Final diagnoses:  Near syncope   82 year old female presents with altered mental status and chest pain. Unclear etiology. She was initially hypoxic on arrival placed on 2 L of O2 via nasal cannula which improved her sats to 100%. She is hypotensive at times. She is afebrile without tachycardia. She is bradycardic at baseline. Patient has an irregular rhythm with scattered rhonchi on exam. She is not complaining of any pain however she has received aspirin and nitroglycerin before arrival. Extremities are cool.   Chest pain work up is unremarkable. EKG is NSR with chronic LBBB, 1st degree AVB, and multiple PVCs. CXR shows chronic prominent lung markings but is otherwise unremarkable. Troponin is 0.01. CBG is 193. Lactic acid is 2.93 however CBC shows WBC of 6.5. BMP is remarkable for BUN/SCr of 21/1.78 which is slightly elevated from baseline. BNP is 117.7 D-dimer is 2.28 - will obtain V/Q scan due to CKD. CT head is unremarkable. UA is  clean. Will consult hospitalist for admission due to increased Lactate and near syncope.  Recardo Evangelist, PA-C 02/06/16 1242  Leonard Schwartz, MD 02/12/16 [phone]

## 2016-02-06 DIAGNOSIS — E118 Type 2 diabetes mellitus with unspecified complications: Secondary | ICD-10-CM | POA: Diagnosis not present

## 2016-02-06 DIAGNOSIS — E1121 Type 2 diabetes mellitus with diabetic nephropathy: Secondary | ICD-10-CM | POA: Diagnosis not present

## 2016-02-06 DIAGNOSIS — I951 Orthostatic hypotension: Secondary | ICD-10-CM | POA: Diagnosis not present

## 2016-02-06 DIAGNOSIS — E0590 Thyrotoxicosis, unspecified without thyrotoxic crisis or storm: Secondary | ICD-10-CM | POA: Diagnosis not present

## 2016-02-06 DIAGNOSIS — R55 Syncope and collapse: Secondary | ICD-10-CM | POA: Diagnosis not present

## 2016-02-06 DIAGNOSIS — R531 Weakness: Secondary | ICD-10-CM | POA: Diagnosis not present

## 2016-02-06 DIAGNOSIS — R42 Dizziness and giddiness: Secondary | ICD-10-CM | POA: Diagnosis not present

## 2016-02-06 DIAGNOSIS — N183 Chronic kidney disease, stage 3 (moderate): Secondary | ICD-10-CM | POA: Diagnosis not present

## 2016-02-06 LAB — TROPONIN I
TROPONIN I: 0.17 ng/mL — AB (ref <0.031)
Troponin I: <0.03 ng/mL (ref <0.031)

## 2016-02-06 LAB — BASIC METABOLIC PANEL
ANION GAP: 8 (ref 5–15)
BUN: 21 mg/dL — ABNORMAL HIGH (ref 6–20)
CO2: 23 mmol/L (ref 22–32)
Calcium: 9 mg/dL (ref 8.9–10.3)
Chloride: 109 mmol/L (ref 101–111)
Creatinine, Ser: 1.66 mg/dL — ABNORMAL HIGH (ref 0.44–1.00)
GFR, EST AFRICAN AMERICAN: 31 mL/min — AB (ref >60)
GFR, EST NON AFRICAN AMERICAN: 27 mL/min — AB (ref >60)
GLUCOSE: 82 mg/dL (ref 65–99)
POTASSIUM: 3.5 mmol/L (ref 3.5–5.1)
Sodium: 140 mmol/L (ref 135–145)

## 2016-02-06 LAB — CBC
HEMATOCRIT: 34.8 % — AB (ref 36.0–46.0)
HEMOGLOBIN: 10.3 g/dL — AB (ref 12.0–15.0)
MCH: 25.2 pg — ABNORMAL LOW (ref 26.0–34.0)
MCHC: 29.6 g/dL — AB (ref 30.0–36.0)
MCV: 85.3 fL (ref 78.0–100.0)
Platelets: 197 10*3/uL (ref 150–400)
RBC: 4.08 MIL/uL (ref 3.87–5.11)
RDW: 13.9 % (ref 11.5–15.5)
WBC: 6.5 10*3/uL (ref 4.0–10.5)

## 2016-02-06 LAB — HEMOGLOBIN A1C
Hgb A1c MFr Bld: 6 % — ABNORMAL HIGH (ref 4.8–5.6)
Mean Plasma Glucose: 126 mg/dL

## 2016-02-06 LAB — GLUCOSE, CAPILLARY
GLUCOSE-CAPILLARY: 172 mg/dL — AB (ref 65–99)
GLUCOSE-CAPILLARY: 256 mg/dL — AB (ref 65–99)
Glucose-Capillary: 117 mg/dL — ABNORMAL HIGH (ref 65–99)
Glucose-Capillary: 120 mg/dL — ABNORMAL HIGH (ref 65–99)
Glucose-Capillary: 63 mg/dL — ABNORMAL LOW (ref 65–99)

## 2016-02-06 LAB — PROTIME-INR
INR: 1.18 (ref 0.00–1.49)
Prothrombin Time: 15.1 seconds (ref 11.6–15.2)

## 2016-02-06 MED ORDER — METOPROLOL SUCCINATE ER 25 MG PO TB24: 6.2500 mg | ORAL_TABLET | Freq: Every day | ORAL | Status: DC

## 2016-02-06 MED ORDER — SODIUM CHLORIDE 0.9 % IV SOLN
INTRAVENOUS | Status: DC
  Administered 2016-02-06 – 2016-02-07 (×3): via INTRAVENOUS

## 2016-02-06 MED ORDER — CALCITRIOL 0.5 MCG PO CAPS
0.5000 ug | ORAL_CAPSULE | ORAL | Status: DC
Start: 2016-02-06 — End: 2016-02-09
  Administered 2016-02-06 – 2016-02-08 (×2): 0.5 ug via ORAL
  Filled 2016-02-06 (×2): qty 1

## 2016-02-06 MED ORDER — MIRABEGRON ER 25 MG PO TB24
50.0000 mg | ORAL_TABLET | Freq: Every day | ORAL | Status: DC
  Administered 2016-02-06 – 2016-02-09 (×4): 50 mg via ORAL
  Filled 2016-02-06: qty 2
  Filled 2016-02-06: qty 1
  Filled 2016-02-06: qty 2
  Filled 2016-02-06: qty 1
  Filled 2016-02-06: qty 2

## 2016-02-06 MED ORDER — FUROSEMIDE 40 MG PO TABS: 60.0000 mg | ORAL_TABLET | Freq: Every day | ORAL | Status: DC

## 2016-02-06 MED ORDER — CYANOCOBALAMIN 500 MCG PO TABS
500.0000 ug | ORAL_TABLET | Freq: Every day | ORAL | Status: DC
  Administered 2016-02-06 – 2016-02-09 (×4): 500 ug via ORAL
  Filled 2016-02-06 (×5): qty 1

## 2016-02-06 NOTE — Progress Notes (Signed)
PT Cancellation Note  Patient Details Name: Anna [name] MRN: [id number] DOB: [birth year]   Cancelled Treatment:    Reason Eval Not Completed: PT screened, no needs identified, will sign off   Daughter present, pt reclined in recliner. Daughter stated pt began feeling dizzy "a few minutes ago" and she reclined her chair/elevated her legs. BP assessed by therapist with MAP 70, HR 58. Daughter denied fall at home. Daughter reported she helped pt on/off BSC earlier this afternoon and patient is "slower" than usual, however did not required more physical assist than her usual. (Daughter provides all patient's care and reports she continues to do OPPT exercises she was given).  Appears issues are medically being managed. Daughter familiar with PT and agrees there is no need for our services at this time. Please re-order if we are needed.    Rondal Vandevelde 02/06/2016, 4:43 PM Pager [phone]

## 2016-02-06 NOTE — Progress Notes (Signed)
PROGRESS NOTE    Anna [name]  [id number] DOB: December 03, 1928 DOA: 02/05/2016 PCP: Haywood Pao, MD   Outpatient Specialists:     Brief Narrative:  Anna [name] is a 82 y.o. female with medical history significant for Afib, A fib not on anticoagulants due to h/o GIB, CKD st 3, HTN, LBBB, hyperthyroidism, DM,CHF, HLD, and recent URI treated with azithromycin, brought to the ED by EMS after she suffered acute episode of chest pain near syncope as she was getting up to go to the bathroom. On transit she was given ASA 324 mg and 1 NTG with chest pain relief. On arrival her Osats were 38, continued on 2l [place] and then O2 dc's as she is on 98 RA.  Chest Pain was accompanied by diaphoresis without Radiation, 6/10, pounding in nature. Pain notworsened with deep inspiration, movement or exertion. Since admission she is pain free. Reports some cough. Denies any fever or chills. Denies any nausea, vomiting or abdominal pain. Appetite is normal and eats healthy foods Denies any leg swelling or calf pain. Denies any headaches or vision changes. Denies any seizures. Currently her mental status is at her normal baseline. Never seen by a cardiologist    Assessment & Plan:   Principal Problem:   Pre-syncope Active Problems:   Persistent atrial fibrillation (HCC)   Benign essential HTN   DM (diabetes mellitus) type II controlled with renal manifestation (HCC)   CKD (chronic kidney disease), stage III   Hyperthyroidism   Generalized weakness   Near syncope   Chest pain   Diabetes mellitus with complication (H[facility])   Orthostatic hypotension -+orthostatics -gentle IVF -daughter has been adjusted lasix based on cough/congestion -hold lasix  Chest pain - I ordered CE as not done by admitting doc  DM -SSI  HTN -BP low -hold meds  CKD -baseline 1.6  Hyperthyroidism -outpatient follow up   DVT prophylaxis:  SCD's  Code Status: Full Code   Family  Communication: Daughter at bedside  Disposition Plan:  Home with daughter in AM  Consultants:     Procedures:        Subjective: Patient has no complaints Daughter has many issues  Objective: Filed Vitals:   02/06/16 0453 02/06/16 1002 02/06/16 1005 02/06/16 1232  BP: 130/59   137/53  Pulse: 45 67 71 61  Temp: 97.8 F (36.6 C)   98.6 F (37 C)  TempSrc: Oral   Oral  Resp: 18   20  Height:      Weight: 91.037 kg (200 lb 11.2 oz)     SpO2: 94%   97%    Intake/Output Summary (Last 24 hours) at 02/06/16 1533 Last data filed at 02/06/16 1406  Gross per 24 hour  Intake   1080 ml  Output      0 ml  Net   1080 ml   Filed Weights   02/05/16 1707 02/06/16 0453  Weight: 93.759 kg (206 lb 11.2 oz) 91.037 kg (200 lb 11.2 oz)    Examination:  General exam: pleasantly confused Respiratory system: Clear to auscultation. Respiratory effort normal. Cardiovascular system: S1 & S2 heard, RRR. No JVD, murmurs, rubs, gallops or clicks. No pedal edema. Gastrointestinal system: Abdomen is nondistended, soft and nontender. No organomegaly or masses felt. Normal bowel sounds heard.     Data Reviewed: I have personally reviewed following labs and imaging studies  CBC:  Recent Labs Lab 02/05/16 1205 02/06/16 0300  WBC 6.5 6.5  HGB 11.6* 10.3*  HCT  39.2 34.8*  MCV 86.0 85.3  PLT 202 [id number]   Basic Metabolic Panel:  Recent Labs Lab 02/05/16 1205 02/06/16 0300  NA 142 140  K 3.9 3.5  CL 109 109  CO2 23 23  GLUCOSE 218* 82  BUN 21* 21*  CREATININE 1.78* 1.66*  CALCIUM 9.3 9.0  MG 2.1  --    GFR: Estimated Creatinine Clearance: 27.1 mL/min (by C-G formula based on Cr of 1.66). Liver Function Tests:  Recent Labs Lab 02/05/16 1205  AST 16  ALT 11*  ALKPHOS 83  BILITOT 0.7  PROT 6.2*  ALBUMIN 3.5   No results for input(s): LIPASE, AMYLASE in the last 168 hours. No results for input(s): AMMONIA in the last 168 hours. Coagulation Profile:  Recent  Labs Lab 02/05/16 1205 02/06/16 0300  INR 1.10 1.18   Cardiac Enzymes: No results for input(s): CKTOTAL, CKMB, CKMBINDEX, TROPONINI in the last 168 hours. BNP (last 3 results) No results for input(s): PROBNP in the last 8760 hours. HbA1C:  Recent Labs  02/05/16 1730  HGBA1C 6.0*   CBG:  Recent Labs Lab 02/05/16 1236 02/05/16 1709 02/05/16 2128 02/06/16 0743 02/06/16 1144  GLUCAP 193* 141* 129* 117* 172*   Lipid Profile: No results for input(s): CHOL, HDL, LDLCALC, TRIG, CHOLHDL, LDLDIRECT in the last 72 hours. Thyroid Function Tests:  Recent Labs  02/05/16 2050  TSH 0.302*   Anemia Panel: No results for input(s): VITAMINB12, FOLATE, FERRITIN, TIBC, IRON, RETICCTPCT in the last 72 hours. Urine analysis:    Component Value Date/Time   COLORURINE YELLOW 02/05/2016 [place] 02/05/2016 1252   LABSPEC 1.016 02/05/2016 1252   PHURINE 6.0 02/05/2016 1252   GLUCOSEU NEGATIVE 02/05/2016 1252   HGBUR NEGATIVE 02/05/2016 1252   [place] 02/05/2016 1252   KETONESUR NEGATIVE 02/05/2016 1252   PROTEINUR NEGATIVE 02/05/2016 1252   UROBILINOGEN 0.2 01/27/2015 1353   NITRITE NEGATIVE 02/05/2016 [place] 02/05/2016 1252     )No results found for this or any previous visit (from the past 240 hour(s)).    Anti-infectives    None       Radiology Studies: Ct Head Wo Contrast  02/05/2016  CLINICAL DATA:  Altered mental status.  Chronic dementia EXAM: CT HEAD WITHOUT CONTRAST TECHNIQUE: Contiguous axial images were obtained from the base of the skull through the vertex without intravenous contrast. COMPARISON:  None. FINDINGS: There is mild diffuse atrophy. There is no intracranial mass hemorrhage, extra-axial fluid collection, or midline shift. There is mild patchy small vessel disease in the centra semiovale bilaterally. Elsewhere, gray-white compartments appear normal. No acute infarct evident. The bony calvarium appears  intact. The mastoid air cells are clear. No intraorbital lesions are evident. There is apparent cerumen in the right external auditory canal. IMPRESSION: Mild atrophy with patchy periventricular small vessel disease. No intracranial mass, hemorrhage, or evidence of acute infarct. There is cerumen in the external auditory canal. Electronically Signed   By: Lowella Grip III M.D.   On: 02/05/2016 12:33   Nm Pulmonary Perf And Vent  02/05/2016  CLINICAL DATA:  Shortness of breath, BILATERAL lower extremity edema, atrial fibrillation but not on anti coagulation due to GI bleeding, hypertension, stage III chronic kidney disease, diabetes mellitus, CHF EXAM: NUCLEAR MEDICINE VENTILATION - PERFUSION LUNG SCAN TECHNIQUE: Ventilation images were obtained in multiple projections using inhaled aerosol Tc-38m DTPA. Perfusion images were obtained in multiple projections after intravenous injection of Tc-15m MAA. Unable to perform lateral views due  to patient being unable to move arms from imaged field. RADIOPHARMACEUTICALS:  30.5 mCi Technetium-44m DTPA aerosol inhalation and 4.0 mCi Technetium-46m MAA IV COMPARISON:  None Correlation: Chest radiograph 02/05/2016 FINDINGS: Ventilation: No focal ventilatory defects. Perfusion: Normal perfusion pattern. Chest radiograph: Enlargement of cardiac silhouette without acute infiltrate. IMPRESSION: Normal ventilation and perfusion lung scan. Electronically Signed   By: Lavonia Dana M.D.   On: 02/05/2016 17:03   Dg Chest Portable 1 View  02/05/2016  CLINICAL DATA:  Chest pain and shortness of breath. EXAM: PORTABLE CHEST 1 VIEW COMPARISON:  08/26/2015 FINDINGS: Lung markings are mildly prominent but appear chronic. No focal airspace disease or pulmonary edema. Heart and mediastinum are within normal limits. The trachea is midline. Negative for a pneumothorax. No acute bone abnormality. Subtle linear density in the left -lower chest appears stable. IMPRESSION: No active disease.  Electronically Signed   By: Markus Daft M.D.   On: 02/05/2016 12:12        Scheduled Meds: . aspirin  325 mg Oral Daily  . donepezil  10 mg Oral QHS  . [START ON 02/07/2016] furosemide  60 mg Oral Daily  . insulin aspart  0-9 Units Subcutaneous TID WC  . pravastatin  40 mg Oral Daily   Continuous Infusions: . sodium chloride 75 mL/hr at 02/06/16 1300        Time spent: 25 min    [place], DO [facility] Pager [phone]  If 7PM-7AM, please contact night-coverage [email or web address] Password [facility] 02/06/2016, 3:33 PM

## 2016-02-07 DIAGNOSIS — N183 Chronic kidney disease, stage 3 (moderate): Secondary | ICD-10-CM

## 2016-02-07 DIAGNOSIS — R42 Dizziness and giddiness: Secondary | ICD-10-CM | POA: Diagnosis not present

## 2016-02-07 DIAGNOSIS — E0590 Thyrotoxicosis, unspecified without thyrotoxic crisis or storm: Secondary | ICD-10-CM | POA: Diagnosis not present

## 2016-02-07 DIAGNOSIS — I951 Orthostatic hypotension: Secondary | ICD-10-CM | POA: Diagnosis not present

## 2016-02-07 DIAGNOSIS — E1121 Type 2 diabetes mellitus with diabetic nephropathy: Secondary | ICD-10-CM | POA: Diagnosis not present

## 2016-02-07 DIAGNOSIS — E118 Type 2 diabetes mellitus with unspecified complications: Secondary | ICD-10-CM | POA: Diagnosis not present

## 2016-02-07 DIAGNOSIS — R55 Syncope and collapse: Secondary | ICD-10-CM | POA: Diagnosis not present

## 2016-02-07 DIAGNOSIS — R531 Weakness: Secondary | ICD-10-CM | POA: Diagnosis not present

## 2016-02-07 LAB — BASIC METABOLIC PANEL
Anion gap: 5 (ref 5–15)
BUN: 18 mg/dL (ref 6–20)
CHLORIDE: 110 mmol/L (ref 101–111)
CO2: 24 mmol/L (ref 22–32)
CREATININE: 1.46 mg/dL — AB (ref 0.44–1.00)
Calcium: 8.8 mg/dL — ABNORMAL LOW (ref 8.9–10.3)
GFR calc non Af Amer: 32 mL/min — ABNORMAL LOW (ref >60)
GFR, EST AFRICAN AMERICAN: 37 mL/min — AB (ref >60)
Glucose, Bld: 115 mg/dL — ABNORMAL HIGH (ref 65–99)
POTASSIUM: 3.5 mmol/L (ref 3.5–5.1)
SODIUM: 139 mmol/L (ref 135–145)

## 2016-02-07 LAB — GLUCOSE, CAPILLARY
GLUCOSE-CAPILLARY: 104 mg/dL — AB (ref 65–99)
GLUCOSE-CAPILLARY: 159 mg/dL — AB (ref 65–99)
GLUCOSE-CAPILLARY: 153 mg/dL — AB (ref 65–99)

## 2016-02-07 LAB — CBC
HEMATOCRIT: 32.5 % — AB (ref 36.0–46.0)
HEMOGLOBIN: 9.7 g/dL — AB (ref 12.0–15.0)
MCH: 25.7 pg — ABNORMAL LOW (ref 26.0–34.0)
MCHC: 29.8 g/dL — AB (ref 30.0–36.0)
MCV: 86 fL (ref 78.0–100.0)
Platelets: 181 10*3/uL (ref 150–400)
RBC: 3.78 MIL/uL — AB (ref 3.87–5.11)
RDW: 14.2 % (ref 11.5–15.5)
WBC: 6.5 10*3/uL (ref 4.0–10.5)

## 2016-02-07 LAB — TROPONIN I: TROPONIN I: 0.03 ng/mL (ref <0.031)

## 2016-02-07 MED ORDER — AMLODIPINE BESYLATE 2.5 MG PO TABS: 2.5000 mg | ORAL_TABLET | Freq: Every day | ORAL | Status: DC

## 2016-02-07 MED ORDER — INSULIN ASPART 100 UNIT/ML ~~LOC~~ SOLN
0.0000 [IU] | Freq: Three times a day (TID) | SUBCUTANEOUS | Status: DC
  Administered 2016-02-08 – 2016-02-09 (×2): 1 [IU] via SUBCUTANEOUS

## 2016-02-07 MED ORDER — CARVEDILOL 3.125 MG PO TABS
3.1250 mg | ORAL_TABLET | Freq: Two times a day (BID) | ORAL | Status: DC
  Administered 2016-02-07 – 2016-02-08 (×2): 3.125 mg via ORAL
  Filled 2016-02-07 (×3): qty 1

## 2016-02-07 NOTE — Progress Notes (Signed)
PROGRESS NOTE    Anna [name]  [id number] DOB: 08/22/1929 DOA: 02/05/2016 PCP: Haywood Pao, MD   Outpatient Specialists:     Brief Narrative:  Anna [name] is a 82 y.o. female with medical history significant for Afib, A fib not on anticoagulants due to h/o GIB, CKD st 3, HTN, LBBB, hyperthyroidism, DM,CHF, HLD, and recent URI treated with azithromycin, brought to the ED by EMS after she suffered acute episode of chest pain near syncope as she was getting up to go to the bathroom. On transit she was given ASA 324 mg and 1 NTG with chest pain relief. On arrival her Osats were 15, continued on 2l [place] and then O2 dc's as she is on 98 RA.  Chest Pain was accompanied by diaphoresis without Radiation, 6/10, pounding in nature. Pain notworsened with deep inspiration, movement or exertion. Since admission she is pain free. Reports some cough. Denies any fever or chills. Denies any nausea, vomiting or abdominal pain. Appetite is normal and eats healthy foods Denies any leg swelling or calf pain. Denies any headaches or vision changes. Denies any seizures. Currently her mental status is at her normal baseline.   Assessment & Plan:   Principal Problem:   Pre-syncope Active Problems:   Persistent atrial fibrillation (HCC)   Benign essential HTN   DM (diabetes mellitus) type II controlled with renal manifestation (HCC)   CKD (chronic kidney disease), stage III   Hyperthyroidism   Generalized weakness   Near syncope   Chest pain   Diabetes mellitus with complication (HCC)   Orthostatic hypotension -+orthostatics -gentle IVF -daughter has been adjusted lasix based on cough/congestion -hold lasix for now -recheck orthos in AM-- patient not able to give good history--- complains of "dizziness" when sitting up but unable to quantify-- no dizziness laying down-- doubt CVA -family declined PT  Chest pain - resolved -has LBBB -CE trended from 0.17 to < .0.03 -patient  has dementia and not a candidate for intervention-- medical management  DM -SSI  HTN --low dose coreg  CKD -baseline 1.6?  Hyperthyroidism -outpatient follow up  Dementia -daughter appears to be in denial, says patient's dementia is "not bad"  DVT prophylaxis:  SCD's  Code Status: Full Code   Family Communication: Daughter at bedside  Disposition Plan:  Home with daughter  Consultants:     Procedures:        Subjective: C/o dizziness when sitting up  Objective: Filed Vitals:   02/06/16 1833 02/06/16 2031 02/07/16 0609 02/07/16 1212  BP:  137/49 163/70 163/57  Pulse: 66 70 69 100  Temp:  98.5 F (36.9 C) 98.3 F (36.8 C) 98.8 F (37.1 C)  TempSrc:  Oral Oral Oral  Resp:  18  20  Height:      Weight:   96.344 kg (212 lb 6.4 oz)   SpO2:  99% 96% 97%    Intake/Output Summary (Last 24 hours) at 02/07/16 1253 Last data filed at 02/07/16 0837  Gross per 24 hour  Intake 2346.25 ml  Output      0 ml  Net 2346.25 ml   Filed Weights   02/05/16 1707 02/06/16 0453 02/07/16 0609  Weight: 93.759 kg (206 lb 11.2 oz) 91.037 kg (200 lb 11.2 oz) 96.344 kg (212 lb 6.4 oz)    Examination:  General exam: pleasantly confused Respiratory system: Clear to auscultation. Respiratory effort normal. Cardiovascular system: S1 & S2 heard, RRR. No JVD, murmurs, rubs, gallops or clicks. No pedal edema.  Gastrointestinal system: Abdomen is nondistended, soft and nontender. No organomegaly or masses felt. Normal bowel sounds heard.     Data Reviewed: I have personally reviewed following labs and imaging studies  CBC:  Recent Labs Lab 02/05/16 1205 02/06/16 0300 02/07/16 0329  WBC 6.5 6.5 6.5  HGB 11.6* 10.3* 9.7*  HCT 39.2 34.8* 32.5*  MCV 86.0 85.3 86.0  PLT 202 197 0000000   Basic Metabolic Panel:  Recent Labs Lab 02/05/16 1205 02/06/16 0300 02/07/16 0329  NA 142 140 139  K 3.9 3.5 3.5  CL 109 109 110  CO2 23 23 24   GLUCOSE 218* 82 115*  BUN 21*  21* 18  CREATININE 1.78* 1.66* 1.46*  CALCIUM 9.3 9.0 8.8*  MG 2.1  --   --    GFR: Estimated Creatinine Clearance: 31.7 mL/min (by C-G formula based on Cr of 1.46). Liver Function Tests:  Recent Labs Lab 02/05/16 1205  AST 16  ALT 11*  ALKPHOS 83  BILITOT 0.7  PROT 6.2*  ALBUMIN 3.5   No results for input(s): LIPASE, AMYLASE in the last 168 hours. No results for input(s): AMMONIA in the last 168 hours. Coagulation Profile:  Recent Labs Lab 02/05/16 1205 02/06/16 0300  INR 1.10 1.18   Cardiac Enzymes:  Recent Labs Lab 02/06/16 1543 02/06/16 2113 02/07/16 0329  TROPONINI 0.17* <0.03 0.03   BNP (last 3 results) No results for input(s): PROBNP in the last 8760 hours. HbA1C:  Recent Labs  02/05/16 1730  HGBA1C 6.0*   CBG:  Recent Labs Lab 02/06/16 1542 02/06/16 2037 02/06/16 2122 02/07/16 0601 02/07/16 1123  GLUCAP 256* 63* 120* 104* 159*   Lipid Profile: No results for input(s): CHOL, HDL, LDLCALC, TRIG, CHOLHDL, LDLDIRECT in the last 72 hours. Thyroid Function Tests:  Recent Labs  02/05/16 2050  TSH 0.302*   Anemia Panel: No results for input(s): VITAMINB12, FOLATE, FERRITIN, TIBC, IRON, RETICCTPCT in the last 72 hours. Urine analysis:    Component Value Date/Time   COLORURINE YELLOW 02/05/2016 [place] 02/05/2016 1252   LABSPEC 1.016 02/05/2016 1252   PHURINE 6.0 02/05/2016 1252   GLUCOSEU NEGATIVE 02/05/2016 1252   HGBUR NEGATIVE 02/05/2016 1252   [place] 02/05/2016 1252   KETONESUR NEGATIVE 02/05/2016 1252   PROTEINUR NEGATIVE 02/05/2016 1252   UROBILINOGEN 0.2 01/27/2015 1353   NITRITE NEGATIVE 02/05/2016 [place] 02/05/2016 1252     )No results found for this or any previous visit (from the past 240 hour(s)).    Anti-infectives    None       Radiology Studies: Nm Pulmonary Perf And Vent  02/05/2016  CLINICAL DATA:  Shortness of breath, BILATERAL lower extremity edema,  atrial fibrillation but not on anti coagulation due to GI bleeding, hypertension, stage III chronic kidney disease, diabetes mellitus, CHF EXAM: NUCLEAR MEDICINE VENTILATION - PERFUSION LUNG SCAN TECHNIQUE: Ventilation images were obtained in multiple projections using inhaled aerosol Tc-58m DTPA. Perfusion images were obtained in multiple projections after intravenous injection of Tc-21m MAA. Unable to perform lateral views due to patient being unable to move arms from imaged field. RADIOPHARMACEUTICALS:  30.5 mCi Technetium-83m DTPA aerosol inhalation and 4.0 mCi Technetium-46m MAA IV COMPARISON:  None Correlation: Chest radiograph 02/05/2016 FINDINGS: Ventilation: No focal ventilatory defects. Perfusion: Normal perfusion pattern. Chest radiograph: Enlargement of cardiac silhouette without acute infiltrate. IMPRESSION: Normal ventilation and perfusion lung scan. Electronically Signed   By: Lavonia Dana M.D.   On: 02/05/2016 17:03  Scheduled Meds: . aspirin  325 mg Oral Daily  . calcitRIOL  0.5 mcg Oral Once per day on Mon Wed Fri  . carvedilol  3.125 mg Oral BID WC  . cyanocobalamin  500 mcg Oral Daily  . donepezil  10 mg Oral QHS  . insulin aspart  0-9 Units Subcutaneous TID WC  . mirabegron ER  50 mg Oral Daily  . pravastatin  40 mg Oral Daily   Continuous Infusions: . sodium chloride 75 mL/hr at 02/07/16 0042        Time spent: 25 min    [place], DO [facility] Pager [phone]  If 7PM-7AM, please contact night-coverage [email or web address] Password [facility] 02/07/2016, 12:53 PM

## 2016-02-08 ENCOUNTER — Encounter (HOSPITAL_COMMUNITY): Payer: Self-pay | Admitting: Cardiology

## 2016-02-08 DIAGNOSIS — I10 Essential (primary) hypertension: Secondary | ICD-10-CM

## 2016-02-08 DIAGNOSIS — N183 Chronic kidney disease, stage 3 (moderate): Secondary | ICD-10-CM | POA: Diagnosis not present

## 2016-02-08 DIAGNOSIS — I951 Orthostatic hypotension: Secondary | ICD-10-CM | POA: Diagnosis not present

## 2016-02-08 DIAGNOSIS — E0590 Thyrotoxicosis, unspecified without thyrotoxic crisis or storm: Secondary | ICD-10-CM | POA: Diagnosis not present

## 2016-02-08 DIAGNOSIS — R55 Syncope and collapse: Secondary | ICD-10-CM | POA: Diagnosis not present

## 2016-02-08 DIAGNOSIS — R42 Dizziness and giddiness: Secondary | ICD-10-CM | POA: Diagnosis not present

## 2016-02-08 DIAGNOSIS — R531 Weakness: Secondary | ICD-10-CM | POA: Diagnosis not present

## 2016-02-08 DIAGNOSIS — E1121 Type 2 diabetes mellitus with diabetic nephropathy: Secondary | ICD-10-CM | POA: Diagnosis not present

## 2016-02-08 DIAGNOSIS — E118 Type 2 diabetes mellitus with unspecified complications: Secondary | ICD-10-CM | POA: Diagnosis not present

## 2016-02-08 LAB — BASIC METABOLIC PANEL
Anion gap: 4 — ABNORMAL LOW (ref 5–15)
BUN: 14 mg/dL (ref 6–20)
CALCIUM: 8.8 mg/dL — AB (ref 8.9–10.3)
CO2: 25 mmol/L (ref 22–32)
CREATININE: 1.37 mg/dL — AB (ref 0.44–1.00)
Chloride: 112 mmol/L — ABNORMAL HIGH (ref 101–111)
GFR calc non Af Amer: 34 mL/min — ABNORMAL LOW (ref >60)
GFR, EST AFRICAN AMERICAN: 40 mL/min — AB (ref >60)
GLUCOSE: 169 mg/dL — AB (ref 65–99)
Potassium: 4.3 mmol/L (ref 3.5–5.1)
Sodium: 141 mmol/L (ref 135–145)

## 2016-02-08 LAB — GLUCOSE, CAPILLARY
GLUCOSE-CAPILLARY: 156 mg/dL — AB (ref 65–99)
GLUCOSE-CAPILLARY: 155 mg/dL — AB (ref 65–99)
Glucose-Capillary: 119 mg/dL — ABNORMAL HIGH (ref 65–99)
Glucose-Capillary: 149 mg/dL — ABNORMAL HIGH (ref 65–99)
Glucose-Capillary: 119 mg/dL — ABNORMAL HIGH (ref 65–99)

## 2016-02-08 MED ORDER — CARBAMIDE PEROXIDE 6.5 % OT SOLN
5.0000 [drp] | Freq: Two times a day (BID) | OTIC | Status: DC
  Administered 2016-02-08 – 2016-02-09 (×3): 5 [drp] via OTIC
  Filled 2016-02-08: qty 15

## 2016-02-08 MED ORDER — MECLIZINE HCL 25 MG PO TABS
12.5000 mg | ORAL_TABLET | Freq: Three times a day (TID) | ORAL | Status: DC
  Administered 2016-02-08 – 2016-02-09 (×3): 12.5 mg via ORAL
  Filled 2016-02-08 (×3): qty 1

## 2016-02-08 NOTE — Progress Notes (Signed)
PROGRESS NOTE    Anna [name]  [id number] DOB: [birth year] DOA: 02/05/2016 PCP: Haywood Pao, MD   Outpatient Specialists:     Brief Narrative:  Anna [name] is a 82 y.o. female with medical history significant for Afib, A fib not on anticoagulants due to h/o GIB, CKD st 3, HTN, LBBB, hyperthyroidism, DM,CHF, HLD, and recent URI treated with azithromycin, brought to the ED by EMS after she suffered acute episode of chest pain near syncope as she was getting up to go to the bathroom. On transit she was given ASA 324 mg and 1 NTG with chest pain relief. On arrival her Osats were 40, continued on 2l [place] and then O2 dc's as she is on 98 RA.  Chest Pain was accompanied by diaphoresis without Radiation, 6/10, pounding in nature. Pain notworsened with deep inspiration, movement or exertion. Since admission she is pain free. Reports some cough. Denies any fever or chills. Denies any nausea, vomiting or abdominal pain. Appetite is normal and eats healthy foods Denies any leg swelling or calf pain. Denies any headaches or vision changes. Denies any seizures. Currently her mental status is at her normal baseline.   Assessment & Plan:   Principal Problem:   Pre-syncope Active Problems:   Persistent atrial fibrillation (HCC)   Benign essential HTN   DM (diabetes mellitus) type II controlled with renal manifestation (HCC)   CKD (chronic kidney disease), stage III   Hyperthyroidism   Generalized weakness   Near syncope   Chest pain   Diabetes mellitus with complication (HCC)   Orthostatic hypotension/dizziness -initially +orthostatic, now with IVF negative but still with dizziness -daughter has been adjusting lasix based on cough/congestion Daughter has also been cutting metoprolol into quarters -hold lasix for now -no dizziness when laying down-- doubt CVA, inial head CT negative -family declined PT but ordered vestibular PT- not able to follow directions for complete  eval but spoke with PT and may benefit from meclizine -from reviewing chart, dizziness has been an issue for > 1 year  Chest pain - resolved -has LBBB -CE trended from 0.17 to < .0.03 -patient has dementia and not a candidate for intervention-- medical management  DM -SSI  HTN --low dose coreg  CKD -baseline 1.6?  Hyperthyroidism -outpatient follow up  Dementia -daughter appears to be in denial, says patient's dementia is "not bad" -husband  Cerumen -daughter cleans ears with Q tip -debrox BID to loosen  DVT prophylaxis:  SCD's  Code Status: Full Code   Family Communication: Daughter at bedside  Disposition Plan:  Home with daughter  Consultants:   PT  cards  Procedures:        Subjective: Daughter states patient is dizzy and gets pale when sitting up Patient has no complaints  Objective: Filed Vitals:   02/07/16 2001 02/08/16 0553 02/08/16 1028 02/08/16 1130  BP: 144/45  134/56 139/52  Pulse: 62  49 51  Temp: 99.6 F (37.6 C) 98.3 F (36.8 C) 98.4 F (36.9 C) 98 F (36.7 C)  TempSrc: Oral Oral Oral Oral  Resp: 18 18 18 18   Height:      Weight:  96.934 kg (213 lb 11.2 oz)    SpO2: 96% 95% 97% 98%    Intake/Output Summary (Last 24 hours) at 02/08/16 1559 Last data filed at 02/08/16 1255  Gross per 24 hour  Intake   1150 ml  Output      0 ml  Net   1150 ml  Filed Weights   02/06/16 0453 02/07/16 0609 02/08/16 0553  Weight: 91.037 kg (200 lb 11.2 oz) 96.344 kg (212 lb 6.4 oz) 96.934 kg (213 lb 11.2 oz)    Examination:  General exam: pleasantly confused- does not talk much as daughter speaks for her Respiratory system: Clear to auscultation. Respiratory effort normal. Cardiovascular system: S1 & S2 heard, RRR. No JVD, murmurs, rubs, gallops or clicks. No pedal edema. Gastrointestinal system: Abdomen is nondistended, soft and nontender. No organomegaly or masses felt. Normal bowel sounds heard.     Data Reviewed: I have  personally reviewed following labs and imaging studies  CBC:  Recent Labs Lab 02/05/16 1205 02/06/16 0300 02/07/16 0329  WBC 6.5 6.5 6.5  HGB 11.6* 10.3* 9.7*  HCT 39.2 34.8* 32.5*  MCV 86.0 85.3 86.0  PLT 202 197 0000000   Basic Metabolic Panel:  Recent Labs Lab 02/05/16 1205 02/06/16 0300 02/07/16 0329 02/08/16 1344  NA 142 140 139 141  K 3.9 3.5 3.5 4.3  CL 109 109 110 112*  CO2 23 23 24 25   GLUCOSE 218* 82 115* 169*  BUN 21* 21* 18 14  CREATININE 1.78* 1.66* 1.46* 1.37*  CALCIUM 9.3 9.0 8.8* 8.8*  MG 2.1  --   --   --    GFR: Estimated Creatinine Clearance: 33.9 mL/min (by C-G formula based on Cr of 1.37). Liver Function Tests:  Recent Labs Lab 02/05/16 1205  AST 16  ALT 11*  ALKPHOS 83  BILITOT 0.7  PROT 6.2*  ALBUMIN 3.5   No results for input(s): LIPASE, AMYLASE in the last 168 hours. No results for input(s): AMMONIA in the last 168 hours. Coagulation Profile:  Recent Labs Lab 02/05/16 1205 02/06/16 0300  INR 1.10 1.18   Cardiac Enzymes:  Recent Labs Lab 02/06/16 1543 02/06/16 2113 02/07/16 0329  TROPONINI 0.17* <0.03 0.03   BNP (last 3 results) No results for input(s): PROBNP in the last 8760 hours. HbA1C:  Recent Labs  02/05/16 1730  HGBA1C 6.0*   CBG:  Recent Labs Lab 02/07/16 1123 02/07/16 1613 02/07/16 2131 02/08/16 0554 02/08/16 1137  GLUCAP 159* 153* 156* 119* 149*   Lipid Profile: No results for input(s): CHOL, HDL, LDLCALC, TRIG, CHOLHDL, LDLDIRECT in the last 72 hours. Thyroid Function Tests:  Recent Labs  02/05/16 2050  TSH 0.302*   Anemia Panel: No results for input(s): VITAMINB12, FOLATE, FERRITIN, TIBC, IRON, RETICCTPCT in the last 72 hours. Urine analysis:    Component Value Date/Time   COLORURINE YELLOW 02/05/2016 [place] 02/05/2016 1252   LABSPEC 1.016 02/05/2016 1252   PHURINE 6.0 02/05/2016 1252   GLUCOSEU NEGATIVE 02/05/2016 1252   HGBUR NEGATIVE 02/05/2016 1252    [place] 02/05/2016 1252   KETONESUR NEGATIVE 02/05/2016 1252   PROTEINUR NEGATIVE 02/05/2016 1252   UROBILINOGEN 0.2 01/27/2015 1353   NITRITE NEGATIVE 02/05/2016 [place] 02/05/2016 1252     )No results found for this or any previous visit (from the past 240 hour(s)).    Anti-infectives    None       Radiology Studies: No results found.      Scheduled Meds: . aspirin  325 mg Oral Daily  . calcitRIOL  0.5 mcg Oral Once per day on Mon Wed Fri  . carbamide peroxide  5 drop Both Ears BID  . carvedilol  3.125 mg Oral BID WC  . cyanocobalamin  500 mcg Oral Daily  . donepezil  10 mg Oral QHS  .  insulin aspart  0-9 Units Subcutaneous TID WC  . meclizine  12.5 mg Oral TID  . mirabegron ER  50 mg Oral Daily  . pravastatin  40 mg Oral Daily   Continuous Infusions:        Time spent: 25 min    [place], DO [facility] Pager [phone]  If 7PM-7AM, please contact night-coverage [email or web address] Password [email or web address] 02/08/2016, 3:59 PM

## 2016-02-08 NOTE — Progress Notes (Signed)
Unable to complete orthostatic vitals, pt c/o dizziness while sitting. Pt placed back in bed. Will monitor.

## 2016-02-08 NOTE — Care Management Obs Status (Signed)
[place] NOTIFICATION   Patient Details  Name: Anna [name] MRN: [id number] Date of Birth: [birth year]   Medicare Observation Status Notification Given:  Yes    Lyn Joens T, RN 02/08/2016, 11:30 AM

## 2016-02-08 NOTE — Evaluation (Signed)
Physical Therapy Evaluation Patient Details Name: Anna [name] MRN: [id number] DOB: Aug 11, 1929 Today's Date: 02/08/2016   History of Present Illness  Anna [name] is a 82 y.o. female with medical history significant for Afib, A fib not on anticoagulants due to h/o GIB, CKD st 3, HTN, LBBB, hyperthyroidism, DM,CHF, HLD, and recent URI treated with azithromycin, brought to the ED by EMS after she suffered acute episode of chest pain near syncope as she was getting up to go to the bathroom. On transit she was given ASA 324 mg and 1 NTG with chest pain relief. On arrival her Osats were 51, continued on 2l [place] and then O2 dc's as she is on 98 RA.   Clinical Impression  Pt admitted with above diagnosis. Pt currently with functional limitations due to the deficits listed below (see PT Problem List). Pt appears to possibly have orthostatic hypotension per nurse.  Vestibular testing somewhat limited due to pt cognition but it does appear pt may have a left hypofunction.  Attempted x1 exercises which are difficult for pt to complete as she needs constant cues and is distracted easily.  Pt may benefit from Meclizine or Antivert if she cannot perform exercises consistently.  Will follow acutely.  Pt will benefit from skilled PT to increase their independence and safety with mobility to allow discharge to the venue listed below.      Follow Up Recommendations Home health PT;Supervision/Assistance - 24 hour (for vestibular rehab or SNF if daughter can't take pt)    Equipment Recommendations  Other (comment);Hospital bed (hoyer lift)    Recommendations for Other Services       Precautions / Restrictions Precautions Precautions: Fall Restrictions Weight Bearing Restrictions: No      Mobility  Bed Mobility Overal bed mobility: Needs Assistance Bed Mobility: Supine to Sit     Supine to sit: Min assist;Mod assist        Transfers                 General transfer comment: too  dizzy to stand.  Was orthostatic earlier with nursing.  Ambulation/Gait                Stairs            Wheelchair Mobility    Modified Rankin (Stroke Patients Only)       Balance                                             Pertinent Vitals/Pain Pain Assessment: No/denies pain  VSS    Home Living Family/patient expects to be discharged to:: Private residence Living Arrangements: Children Available Help at Discharge: Family;Available 24 hours/day Type of Home: House Home Access: Stairs to enter Entrance Stairs-Rails: Right Entrance Stairs-Number of Steps: 2 Home Layout: Able to live on main level with bedroom/bathroom Home Equipment: Walker - 2 wheels;Wheelchair - manual;Shower seat;Bedside commode      Prior Function Level of Independence: Needs assistance   Gait / Transfers Assistance Needed: walks with RW short distance or transfers with daughter  ADL's / Homemaking Assistance Needed: assist for bathing/dressing        Hand Dominance        Extremity/Trunk Assessment   Upper Extremity Assessment: Defer to OT evaluation           Lower Extremity Assessment: Generalized weakness  Cervical / Trunk Assessment: Kyphotic  Communication   Communication: No difficulties  Cognition Arousal/Alertness: Awake/alert Behavior During Therapy: Flat affect Overall Cognitive Status: History of cognitive impairments - at baseline                      General Comments General comments (skin integrity, edema, etc.): Tested for BPPV all canals and negative testing.  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      Exercises Other Exercises Other Exercises: Very difficult for pt to perform x1 exercises as pt cannot attend more than a few seconds.  Pt needed constant cues and feel that it will be difficult to progress pt through exercises.  Pt may do better with Antivert or Meclizine given her dementia.        Assessment/Plan    PT Assessment Patient needs continued PT services  PT Diagnosis Generalized weakness (dizziness)   PT Problem List Decreased activity tolerance;Decreased balance;Decreased mobility;Decreased knowledge of use of DME;Decreased safety awareness;Decreased knowledge of precautions  PT Treatment Interventions DME instruction;Functional mobility training;Therapeutic activities;Therapeutic exercise;Balance training;Patient/family education (vestibular rehab)   PT Goals (Current goals can be found in the Care Plan section) Acute Rehab PT Goals Patient Stated Goal: unable to state due to dementia PT Goal Formulation: Patient unable to participate in goal setting Time For Goal Achievement: 02/22/16 Potential to Achieve Goals: Good    Frequency Min 2X/week   Barriers to discharge        Co-evaluation               End of Session   Activity Tolerance: Patient limited by fatigue (limited by dizziness) Patient left: in bed;with call bell/phone within reach;with bed alarm set Nurse Communication: Mobility status    Functional Assessment Tool Used: clinical judgment Functional Limitation: Changing and maintaining body position Changing and Maintaining Body Position Current Status [id number]): At least 20 percent but less than 40 percent impaired, limited or restricted Changing and Maintaining Body Position Goal Status [id number]): At least 1 percent but less than 20 percent impaired, limited or restricted    Time: 1432-1450 PT Time Calculation (min) (ACUTE ONLY): 18 min   Charges:   PT Evaluation $PT Eval Moderate Complexity: 1 Procedure     PT G Codes:   PT G-Codes **NOT FOR INPATIENT CLASS** Functional Assessment Tool Used: clinical judgment Functional Limitation: Changing and maintaining body position Changing and Maintaining Body Position Current Status [id number]): At least 20 percent but less than 40 percent impaired, limited or restricted Changing and  Maintaining Body Position Goal Status [id number]): At least 1 percent but less than 20 percent impaired, limited or restricted    [name] 02/08/2016, 3:21 PM Jensen Engelbert Sevin,PT Acute Rehabilitation [phone] (pager)

## 2016-02-08 NOTE — Consult Note (Signed)
Reason for Consult: dizziness   Referring Physician: Dr. Eliseo Squires   PCP:  Haywood Pao, MD  Primary Cardiologist: Dr. Aggie Cosier, NP  Anna [name] is an 82 y.o. female.    Chief Complaint:  Pt admitted 02/05/16 with chest pain, near syncope. 1 NTG with relief. Some dizziness as well.   HPI:  82 yr old female with hx of PAF, dementia, hyperthyroidism while on amiodarone so amio d/c'd.  She had difficulty with supratherapeutic INR due to confusions with her previous anticoagulation clinic in [place] and had a GI bleed in 2015. She has been off of coumadin and has been on only ASA since that time.She has dementia DM, HTN and hx HF. Though echo 01/2015 with EF 55%, mild LVH.  Repeat Echo with EF 45-50% diffuse hypokinesis.  Mild MR mild TR mildly elevated pulmonary pressure.    She has had prior bradycardia and thus her beta blocker has been weaned over time to 6.25 BID.  Currently on coreg at 3.125  BID.   Came to ER for chest pain and near syncope and 1 NTG with relief of pain. Her EKG with LBBB and freq PVCs.  Troponin pk at 0.17  She was with her daughter and had voided and became weak, pales, staring ahead.  Her glucose was stable, her sp02 was 92 and HR was 39 to 36.  Prior to this for several days she was DOE.   Previously had not done this.  CT head without acute process.   Today pt continue with dizziness.  When she sits on side of bed here, she becomes pale and dizzy. No pain her daughter states she has not had chest pain.  Her amlodipine lasix stopped.   She has memory issues with food and state, at times, but can ambulate some.  No dizziness until this admit.      Past Medical History  Diagnosis Date  . CHF (congestive heart failure) ([place])   . Persistent atrial fibrillation ([place])   . Chronic renal insufficiency, stage III (moderate)   . Diabetes mellitus without complication ([place])   . Hypertension   . Dementia   . LBBB (left bundle branch block)   .  Overactive bladder   . GI bleeding 11/2013    due to supratherapeutic INR  . Hyperlipidemia   . Vitamin D deficiency   . Vitamin B12 deficiency   . Hyperthyroidism     on amiodarone  . Overweight     Past Surgical History  Procedure Laterality Date  . Abdominal hysterectomy      Family History  Problem Relation Age of Onset  . Heart disease Father   . Diabetes Mother   . Thyroid disease Neg Hx    Social History:  reports that she has never smoked. She does not have any smokeless tobacco history on file. She reports that she does not drink alcohol or use illicit drugs.  Allergies:  Allergies  Allergen Reactions  . Pollen Extract     Sneezing, watery eyes    OUTPATIENT MEDICATIONS: No current facility-administered medications on file prior to encounter.   Current Outpatient Prescriptions on File Prior to Encounter  Medication Sig Dispense Refill  . ACCU-CHEK AVIVA PLUS test strip PATIENT TO CHECK SUGARS ONCE DAILY.  3  . acetaminophen (TYLENOL) 500 MG tablet Take 500 mg by mouth every 6 (six) hours as needed for fever.     Marland Kitchen amLODipine (NORVASC) 5 MG  tablet Take 5 mg by mouth daily.     Marland Kitchen aspirin 325 MG tablet Take 325 mg by mouth daily.    . calcitRIOL (ROCALTROL) 0.5 MCG capsule Take 0.5 mcg by mouth 3 (three) times a week. Monday, Wednesday, and Friday only    . docusate sodium (COLACE) 100 MG capsule Take 100 mg by mouth daily as needed for mild constipation.    Marland Kitchen donepezil (ARICEPT) 10 MG tablet Take 10 mg by mouth at bedtime.    . fluticasone (FLONASE) 50 MCG/ACT nasal spray Place 2 sprays into both nostrils daily.    . furosemide (LASIX) 20 MG tablet Take 3 tablets (60 mg total) by mouth daily. 30 tablet 0  . glimepiride (AMARYL) 4 MG tablet Take 4 mg by mouth daily with breakfast.    . guaiFENesin (MUCINEX) 600 MG 12 hr tablet Take 600 mg by mouth 2 (two) times daily as needed for cough. Reported on 12/22/2015    . loratadine (ALAVERT) 10 MG tablet Take 10 mg by  mouth every other day as needed for allergies.    . metoprolol succinate (TOPROL-XL) 25 MG 24 hr tablet Take 6.25 mg by mouth daily.    Marland Kitchen MYRBETRIQ 50 MG TB24 tablet Take 50 mg by mouth daily.     Vladimir Faster Glycol-Propyl Glycol (SYSTANE) 0.4-0.3 % SOLN Apply 1 drop to eye 2 (two) times daily as needed (dry eyes).    . potassium chloride SA (K-DUR,KLOR-CON) 20 MEQ tablet Take 20 mEq by mouth daily.    . pravastatin (PRAVACHOL) 40 MG tablet Take 40 mg by mouth daily.    . vitamin B-12 (CYANOCOBALAMIN) 500 MCG tablet Take 500 mcg by mouth daily.    . Vitamin D, Ergocalciferol, (DRISDOL) 50000 UNITS CAPS capsule Take 50,000 Units by mouth once a week. Mondays        CURRENT MEDICATIONS:  Scheduled Meds: . aspirin  325 mg Oral Daily  . calcitRIOL  0.5 mcg Oral Once per day on Mon Wed Fri  . carbamide peroxide  5 drop Both Ears BID  . cyanocobalamin  500 mcg Oral Daily  . donepezil  10 mg Oral QHS  . insulin aspart  0-9 Units Subcutaneous TID WC  . meclizine  12.5 mg Oral TID  . mirabegron ER  50 mg Oral Daily  . pravastatin  40 mg Oral Daily   Continuous Infusions:  PRN Meds:.promethazine, technetium TC 41M diethylenetriame-pentaacetic acid  Results for orders placed or performed during the hospital encounter of 02/05/16 (from the past 48 hour(s))  Glucose, capillary     Status: Abnormal   Collection Time: 02/06/16  8:37 PM  Result Value Ref Range   Glucose-Capillary 63 (L) 65 - 99 mg/dL  Troponin I (q 6hr x 3)     Status: None   Collection Time: 02/06/16  9:13 PM  Result Value Ref Range   Troponin I <0.03 <0.031 ng/mL    Comment:        NO INDICATION OF MYOCARDIAL INJURY.   Glucose, capillary     Status: Abnormal   Collection Time: 02/06/16  9:22 PM  Result Value Ref Range   Glucose-Capillary 120 (H) 65 - 99 mg/dL   Comment 1 Notify RN    Comment 2 Document in Chart   Troponin I (q 6hr x 3)     Status: None   Collection Time: 02/07/16  3:29 AM  Result Value Ref Range    Troponin I 0.03 <0.031 ng/mL  Comment:        NO INDICATION OF MYOCARDIAL INJURY.   CBC     Status: Abnormal   Collection Time: 02/07/16  3:29 AM  Result Value Ref Range   WBC 6.5 4.0 - 10.5 K/uL   RBC 3.78 (L) 3.87 - 5.11 MIL/uL   Hemoglobin 9.7 (L) 12.0 - 15.0 g/dL   HCT 32.5 (L) 36.0 - 46.0 %   MCV 86.0 78.0 - 100.0 fL   MCH 25.7 (L) 26.0 - 34.0 pg   MCHC 29.8 (L) 30.0 - 36.0 g/dL   RDW 14.2 11.5 - 15.5 %   Platelets 181 150 - 400 K/uL  Basic metabolic panel     Status: Abnormal   Collection Time: 02/07/16  3:29 AM  Result Value Ref Range   Sodium 139 135 - 145 mmol/L   Potassium 3.5 3.5 - 5.1 mmol/L   Chloride 110 101 - 111 mmol/L   CO2 24 22 - 32 mmol/L   Glucose, Bld 115 (H) 65 - 99 mg/dL   BUN 18 6 - 20 mg/dL   Creatinine, Ser 1.46 (H) 0.44 - 1.00 mg/dL   Calcium 8.8 (L) 8.9 - 10.3 mg/dL   GFR calc non Af Amer 32 (L) >60 mL/min   GFR calc Af Amer 37 (L) >60 mL/min    Comment: (NOTE) The eGFR has been calculated using the CKD EPI equation. This calculation has not been validated in all clinical situations. eGFR's persistently <60 mL/min signify possible Chronic Kidney Disease.    Anion gap 5 5 - 15  Glucose, capillary     Status: Abnormal   Collection Time: 02/07/16  6:01 AM  Result Value Ref Range   Glucose-Capillary 104 (H) 65 - 99 mg/dL  Glucose, capillary     Status: Abnormal   Collection Time: 02/07/16 11:23 AM  Result Value Ref Range   Glucose-Capillary 159 (H) 65 - 99 mg/dL   Comment 1 Notify RN   Glucose, capillary     Status: Abnormal   Collection Time: 02/07/16  4:13 PM  Result Value Ref Range   Glucose-Capillary 153 (H) 65 - 99 mg/dL   Comment 1 Notify RN   Glucose, capillary     Status: Abnormal   Collection Time: 02/07/16  9:31 PM  Result Value Ref Range   Glucose-Capillary 156 (H) 65 - 99 mg/dL   Comment 1 Notify RN    Comment 2 Document in Chart   Glucose, capillary     Status: Abnormal   Collection Time: 02/08/16  5:54 AM  Result  Value Ref Range   Glucose-Capillary 119 (H) 65 - 99 mg/dL  Glucose, capillary     Status: Abnormal   Collection Time: 02/08/16 11:37 AM  Result Value Ref Range   Glucose-Capillary 149 (H) 65 - 99 mg/dL  Basic metabolic panel     Status: Abnormal   Collection Time: 02/08/16  1:44 PM  Result Value Ref Range   Sodium 141 135 - 145 mmol/L   Potassium 4.3 3.5 - 5.1 mmol/L   Chloride 112 (H) 101 - 111 mmol/L   CO2 25 22 - 32 mmol/L   Glucose, Bld 169 (H) 65 - 99 mg/dL   BUN 14 6 - 20 mg/dL   Creatinine, Ser 1.37 (H) 0.44 - 1.00 mg/dL   Calcium 8.8 (L) 8.9 - 10.3 mg/dL   GFR calc non Af Amer 34 (L) >60 mL/min   GFR calc Af Amer 40 (L) >60 mL/min    Comment: (  NOTE) The eGFR has been calculated using the CKD EPI equation. This calculation has not been validated in all clinical situations. eGFR's persistently <60 mL/min signify possible Chronic Kidney Disease.    Anion gap 4 (L) 5 - 15  Glucose, capillary     Status: Abnormal   Collection Time: 02/08/16  4:41 PM  Result Value Ref Range   Glucose-Capillary 119 (H) 65 - 99 mg/dL   No results found.  ROS: General:no colds or fevers, + weight increase Skin:no rashes or ulcers HEENT:no blurred vision, no congestion CV:see HPI PUL:see HPI GI:no diarrhea constipation or melena, no indigestion GU:no hematuria, no dysuria MS:no joint pain, no claudication Neuro:no syncope, no lightheadedness Endo:+ diabetes, + thyroid disease   Blood pressure 139/52, pulse 51, temperature 98 F (36.7 C), temperature source Oral, resp. rate 18, height '5\' 4"'  (1.626 m), weight 213 lb 11.2 oz (96.934 kg), SpO2 98 %.  Wt Readings from Last 3 Encounters:  02/08/16 213 lb 11.2 oz (96.934 kg)  01/11/16 211 lb 6.4 oz (95.89 kg)  01/11/16 202 lb 3.2 oz (91.717 kg)    PE: per Dr. Stanford Breed  General:Pleasant affect, NAD Skin:Warm and dry, brisk capillary refill HEENT:normocephalic, sclera clear, mucus membranes moist Neck:supple, no JVD, no bruits    Heart:S1S2 RRR without murmur, gallup, rub or click Lungs:clear without rales, rhonchi, or wheezes SKA:JGOT, non tender, + BS, do not palpate liver spleen or masses Ext:no lower ext edema, 2+ pedal pulses, 2+ radial pulses Neuro:alert and oriented, MAE, follows commands, + facial symmetry    Assessment/Plan Principal Problem:   Pre-syncope Active Problems:   PAF (paroxysmal atrial fibrillation) (HCC)   Benign essential HTN   DM (diabetes mellitus) type II controlled with renal manifestation ([facility])   CKD (chronic kidney disease), stage III   Hyperthyroidism   Generalized weakness   Near syncope   Chest pain   Diabetes mellitus with complication ([place])  1. Sinus brady to 46 on tele, stop BB, may need to stop aricept if brady continues and pt symptomatic.   2. Dizziness BPs have been stable. Currently off Lasix and amlodipine, stopping BB may help - agree with holding lasix and monitor wt and edema. +3,906 3. Elevated troponin pk 0,17  Have all returned to normal  4. PAF maintaining SR  No anticoagulation with hx of GI bleed. Though now she is with family and closer management   5. Hyper thyroid and TSH now 0.302  At discharge lasix 40 mg and may need to decrease to 20.  Neg VQ scan  Cecilie Kicks  Nurse Practitioner Certified [employer] Pager [phone] or after 5pm or weekends call [phone] 02/08/2016, 5:20 PM As above, patient seen and examined. Briefly she is an 82 year old female with past medical history of paroxysmal atrial fibrillation, amiodarone induced hyperthyroidism dementia, hypertension and diabetes mellitus for evaluation of near syncope. Patient appears to have significant dementia as she is alert and oriented 1. She thinks she is in [place] and does not know the year. At the time of this evaluation the patient denies any symptoms. Her daughter states that on the day of admission she became white and was looking at her but would not speak. She  did not loose frank consciousness. She also apparently was diaphoretic. The daughter states the patient did not complain of dyspnea or chest pain.  She was admitted and initial troponin in the emergency room 0.17. However follow-up negative. Electrocardiogram showed sinus rhythm first-degree AV block, PVCs and couplets  and left bundle branch block. VQ scan negative. BNP 117. Recent orthostatics are negative. Patient had Lasix held and was gently hydrated. Telemetry shows sinus rhythm with PVCs.  1 near syncope-etiology unclear. Patient appears to have significant dementia although the daughter does not believe it is severe. History is difficult. Note she did not have frank syncope. She has some bradycardia with heart rates in the 40s on telemetry but no long pauses. Given baseline first-degree AV block and left bundle branch block would discontinue beta-blockade. Could consider a monitor if she has recurrent symptoms in the future though given dementia not clear she would use appropriately. There has been some question of orthostasis. Her Lasix has been held. Can resume at discharge at 40 mg daily with an additional 20 mg as needed. 2 chronic diastolic congestive heart failure-she does not appear to be volume overloaded on examination. Initial BNP is not significantly elevated. Resume Lasix at discharge at 40 mg daily with additional 20 mg as needed. Follow weights at home. 3 mild elevation in troponin-patient's daughter states she never had chest pain and patient has no recollection of events. Follow-up troponins are normal. This is not consistent with an acute coronary syndrome and she is not a good candidate for further ischemia evaluation. 4 hypertension-The patient's blood pressure at present appears to be reasonable off of medications. Would follow and could resume low-dose amlodipine if needed. 5 Dementia-appears to be significant. Kirk Ruths

## 2016-02-08 NOTE — Progress Notes (Signed)
Wrong Patient Information

## 2016-02-09 ENCOUNTER — Telehealth: Payer: Self-pay | Admitting: Endocrinology

## 2016-02-09 DIAGNOSIS — E1121 Type 2 diabetes mellitus with diabetic nephropathy: Secondary | ICD-10-CM | POA: Diagnosis not present

## 2016-02-09 DIAGNOSIS — I10 Essential (primary) hypertension: Secondary | ICD-10-CM | POA: Diagnosis not present

## 2016-02-09 DIAGNOSIS — R42 Dizziness and giddiness: Secondary | ICD-10-CM | POA: Diagnosis not present

## 2016-02-09 DIAGNOSIS — R531 Weakness: Secondary | ICD-10-CM | POA: Diagnosis not present

## 2016-02-09 DIAGNOSIS — I951 Orthostatic hypotension: Secondary | ICD-10-CM | POA: Diagnosis not present

## 2016-02-09 DIAGNOSIS — R55 Syncope and collapse: Secondary | ICD-10-CM | POA: Diagnosis not present

## 2016-02-09 DIAGNOSIS — E118 Type 2 diabetes mellitus with unspecified complications: Secondary | ICD-10-CM | POA: Diagnosis not present

## 2016-02-09 DIAGNOSIS — N183 Chronic kidney disease, stage 3 (moderate): Secondary | ICD-10-CM | POA: Diagnosis not present

## 2016-02-09 DIAGNOSIS — E0590 Thyrotoxicosis, unspecified without thyrotoxic crisis or storm: Secondary | ICD-10-CM | POA: Diagnosis not present

## 2016-02-09 LAB — GLUCOSE, CAPILLARY
Glucose-Capillary: 107 mg/dL — ABNORMAL HIGH (ref 65–99)
Glucose-Capillary: 135 mg/dL — ABNORMAL HIGH (ref 65–99)

## 2016-02-09 MED ORDER — MECLIZINE HCL 12.5 MG PO TABS: 12.5000 mg | ORAL_TABLET | Freq: Three times a day (TID) | ORAL | Status: DC

## 2016-02-09 MED ORDER — FUROSEMIDE 40 MG PO TABS: 40.0000 mg | ORAL_TABLET | Freq: Every day | ORAL | Status: DC

## 2016-02-09 MED ORDER — CARBAMIDE PEROXIDE 6.5 % OT SOLN: 5.0000 [drp] | Freq: Two times a day (BID) | OTIC | Status: DC

## 2016-02-09 NOTE — Progress Notes (Signed)
Physical Therapy Treatment Patient Details Name: Anna [name] MRN: [id number] DOB: June 21, 1929 Today's Date: 02/09/2016    History of Present Illness Sarita Zamot is a 82 y.o. female with medical history significant for Afib, A fib not on anticoagulants due to h/o GIB, CKD st 3, HTN, LBBB, hyperthyroidism, DM,CHF, HLD, and recent URI treated with azithromycin, brought to the ED by EMS after she suffered acute episode of chest pain near syncope as she was getting up to go to the bathroom. On transit she was given ASA 324 mg and 1 NTG with chest pain relief. On arrival her Osats were 67, continued on 2l [place] and then O2 dc's as she is on 98 RA.     PT Comments    Pt con't to have difficulty participating in VORx1 exercises for L hypofunction however daughter understand exercise and is able to assist mother as able. Pt did stand this date to move to [facility] but appears to be in discomfort from hemorrhoids.  Follow Up Recommendations  Home health PT;Supervision/Assistance - 24 hour     Equipment Recommendations  Other (comment);Hospital bed    Recommendations for Other Services       Precautions / Restrictions Precautions Precautions: Fall Restrictions Weight Bearing Restrictions: No    Mobility  Bed Mobility Overal bed mobility: Needs Assistance Bed Mobility: Supine to Sit     Supine to sit: Mod assist        Transfers Overall transfer level: Needs assistance Equipment used: 2 person hand held assist (lift with gait belt) Transfers: Sit to/from Stand Sit to Stand: Min assist;Mod assist;+2 physical assistance         General transfer comment: dtr assisted pt with transfer  Ambulation/Gait         Gait velocity: slow   General Gait Details: limited to side steps to [facility] in prep for going inside , max directional and tactile verbal cues   Stairs            Wheelchair Mobility    Modified Rankin (Stroke Patients Only)       Balance                                     Cognition Arousal/Alertness: Awake/alert Behavior During Therapy: Flat affect Overall Cognitive Status: History of cognitive impairments - at baseline (dementa)                      Exercises Other Exercises Other Exercises: went over VOR x 1 exercises. pt unable to stay on task due to inability to maintain attn and noted onset of dizziness as pt would close eyes. pts daughter with good understanding of exercises and knows how to execute them 3x/day, 3 reps each time    General Comments        Pertinent Vitals/Pain Pain Assessment: Faces Faces Pain Scale: Hurts little more Pain Location: hemrroids    Home Living                      Prior Function            PT Goals (current goals can now be found in the care plan section) Progress towards PT goals: Progressing toward goals    Frequency  Min 2X/week    PT Plan Current plan remains appropriate    Co-evaluation  End of Session Equipment Utilized During Treatment: Gait belt Activity Tolerance: Patient limited by fatigue (and discomfort of hemrroids) Patient left: in bed;with call bell/phone within reach;with bed alarm set;with family/visitor present     Time: [id number] PT Time Calculation (min) (ACUTE ONLY): 16 min  Charges:  $Therapeutic Exercise: 8-22 mins                    G Codes:      Kingsley Callander 02/09/2016, 3:07 PM   Kittie Plater, PT, DPT Pager #: [phone] Office #: [phone]

## 2016-02-09 NOTE — Care Management Note (Signed)
Case Management Note  Patient Details  Name: Anna [name] MRN: [id number] Date of Birth: 12/23/1928  Subjective/Objective:       Admitted with pre syncope             Action/Plan: Patient lives at home with her daughter; PCP is Dr Osborne Casco; private insurance with [facility] Medicare with prescription drug coverage; Long talk with pt / daughter about [facility] choices, pt/ daughter refused HHC at this time. 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 CM informed patient and daughter that if she changed her mind after discharge and wanted [place] services to call her PCP and he can make the arrangements from the office; 3:1 was ordered and to be delivered to the room today prior to discharging home.  Expected Discharge Date: 02/09/2016              Expected Discharge Plan:  [place]  Discharge planning Services  CM Consult    Choice offered to:  Patient, Adult Children  DME Arranged:  3-N-1 DME Agency:  [place]:  Patient Refused [facility] Agency:     Status of Service:  Completed, signed off  Sherrilyn Rist [id number] 02/09/2016, 2:55 PM

## 2016-02-09 NOTE — Progress Notes (Signed)
Pt has orders to be discharged. Discharge instructions given and pt has no additional questions at this time. Medication regimen reviewed and pt educated. Pt verbalized understanding and has no additional questions. Telemetry box removed. IV removed and site in good condition. Pt stable and waiting for transportation. 

## 2016-02-09 NOTE — Telephone Encounter (Signed)
Only partial labs were done, TSH test borderline.  Will need to reschedule her appointment

## 2016-02-09 NOTE — Discharge Summary (Signed)
Physician Discharge Summary  Anna [name] [id number] DOB: [birth year] DOA: 02/05/2016  PCP: Haywood Pao, MD  Admit date: 02/05/2016 Discharge date: 02/09/2016   Recommendations for Outpatient Follow-Up:   1. May need d/c of aricept if continues to be bradycardic 2. Home health-- daughter refused as well 3. Daughter refused hospital bed/lift 4. Daily weights   Discharge Diagnosis:   Principal Problem:   Pre-syncope Active Problems:   PAF (paroxysmal atrial fibrillation) (HCC)   Benign essential HTN   DM (diabetes mellitus) type II controlled with renal manifestation (HCC)   CKD (chronic kidney disease), stage III   Hyperthyroidism   Generalized weakness   Near syncope   Chest pain   Diabetes mellitus with complication [facility])   Discharge disposition:  Home with 24 hour care  Discharge Condition: Improved.  Diet recommendation: Low sodium, heart healthy  Wound care: None.   History of Present Illness:   Anna [name] is a 82 y.o. female with medical history significant for Afib, A fib not on anticoagulants due to h/o GIB, CKD st 3, HTN, LBBB, hyperthyroidism, DM,CHF, HLD, and recent URI treated with azithromycin, brought to the ED by EMS after she suffered acute episode of chest pain near syncope as she was getting up to go to the bathroom. On transit she was given ASA 324 mg and 1 NTG with chest pain relief. On arrival her Osats were 73, continued on 2l [place] and then O2 dc's as she is on 98 RA.  Chest Pain was accompanied by diaphoresis without Radiation, 6/10, pounding in nature. Pain notworsened with deep inspiration, movement or exertion. Since admission she is pain free. Reports some cough. Denies any fever or chills. Denies any nausea, vomiting or abdominal pain. Appetite is normal and eats healthy foods Denies any leg swelling or calf pain. Denies any headaches or vision changes. Denies any seizures. Currently her mental status is at her normal  baseline. Never seen by a cardiologist    Hospital Course by Problem:   Orthostatic hypotension/dizziness -initially +orthostatic, now with IVF negative but still with dizziness -daughter has been adjusting lasix based on cough/congestion Daughter has also been cutting metoprolol into quarters -resume lasix at 40 mg daily- can use 20 mg PRN BMP 1 week  vestibular PT- not able to follow directions for complete eval but spoke with PT and may benefit from meclizine -home PT -from reviewing chart, dizziness has been an issue for > 1 year  Chest pain - resolved -has LBBB -CE trended from 0.17 to < .0.03 -patient has dementia and not a candidate for intervention-- medical management  DM -resume home meds HTN --hold all meds -could resume low-dose amlodipine if needed as outpt.  CKD -baseline 1.6? -improve with hydration  Hyperthyroidism -outpatient follow up  Dementia -daughter appears to be in denial, says patient's dementia is "not bad" -long discussion of prognosis and expected course  Cerumen -debrox BID to loosen    Medical Consultants:    cards   Discharge Exam:   Filed Vitals:   02/09/16 0900 02/09/16 1131  BP: 131/41 149/62  Pulse: 58 55  Temp: 98.7 F (37.1 C) 99.1 F (37.3 C)  Resp: 18 18   Filed Vitals:   02/08/16 2111 02/09/16 0556 02/09/16 0900 02/09/16 1131  BP: 135/42 146/49 131/41 149/62  Pulse: 55 55 58 55  Temp: 99 F (37.2 C) 98 F (36.7 C) 98.7 F (37.1 C) 99.1 F (37.3 C)  TempSrc: Oral Oral Oral Oral  Resp: 18 20 18 18   Height:      Weight:  97.115 kg (214 lb 1.6 oz)    SpO2: 98% 97% 97% 95%    Gen:  NAD- only says few words    The results of significant diagnostics from this hospitalization (including imaging, microbiology, ancillary and laboratory) are listed below for reference.     Procedures and Diagnostic Studies:   Ct Head Wo Contrast  02/05/2016  CLINICAL DATA:  Altered mental status.  Chronic dementia  EXAM: CT HEAD WITHOUT CONTRAST TECHNIQUE: Contiguous axial images were obtained from the base of the skull through the vertex without intravenous contrast. COMPARISON:  None. FINDINGS: There is mild diffuse atrophy. There is no intracranial mass hemorrhage, extra-axial fluid collection, or midline shift. There is mild patchy small vessel disease in the centra semiovale bilaterally. Elsewhere, gray-white compartments appear normal. No acute infarct evident. The bony calvarium appears intact. The mastoid air cells are clear. No intraorbital lesions are evident. There is apparent cerumen in the right external auditory canal. IMPRESSION: Mild atrophy with patchy periventricular small vessel disease. No intracranial mass, hemorrhage, or evidence of acute infarct. There is cerumen in the external auditory canal. Electronically Signed   By: Lowella Grip III M.D.   On: 02/05/2016 12:33   Nm Pulmonary Perf And Vent  02/05/2016  CLINICAL DATA:  Shortness of breath, BILATERAL lower extremity edema, atrial fibrillation but not on anti coagulation due to GI bleeding, hypertension, stage III chronic kidney disease, diabetes mellitus, CHF EXAM: NUCLEAR MEDICINE VENTILATION - PERFUSION LUNG SCAN TECHNIQUE: Ventilation images were obtained in multiple projections using inhaled aerosol Tc-61m DTPA. Perfusion images were obtained in multiple projections after intravenous injection of Tc-62m MAA. Unable to perform lateral views due to patient being unable to move arms from imaged field. RADIOPHARMACEUTICALS:  30.5 mCi Technetium-63m DTPA aerosol inhalation and 4.0 mCi Technetium-68m MAA IV COMPARISON:  None Correlation: Chest radiograph 02/05/2016 FINDINGS: Ventilation: No focal ventilatory defects. Perfusion: Normal perfusion pattern. Chest radiograph: Enlargement of cardiac silhouette without acute infiltrate. IMPRESSION: Normal ventilation and perfusion lung scan. Electronically Signed   By: Lavonia Dana M.D.   On: 02/05/2016  17:03   Dg Chest Portable 1 View  02/05/2016  CLINICAL DATA:  Chest pain and shortness of breath. EXAM: PORTABLE CHEST 1 VIEW COMPARISON:  08/26/2015 FINDINGS: Lung markings are mildly prominent but appear chronic. No focal airspace disease or pulmonary edema. Heart and mediastinum are within normal limits. The trachea is midline. Negative for a pneumothorax. No acute bone abnormality. Subtle linear density in the left -lower chest appears stable. IMPRESSION: No active disease. Electronically Signed   By: Markus Daft M.D.   On: 02/05/2016 12:12     Labs:   Basic Metabolic Panel:  Recent Labs Lab 02/05/16 1205 02/06/16 0300 02/07/16 0329 02/08/16 1344  NA 142 140 139 141  K 3.9 3.5 3.5 4.3  CL 109 109 110 112*  CO2 23 23 24 25   GLUCOSE 218* 82 115* 169*  BUN 21* 21* 18 14  CREATININE 1.78* 1.66* 1.46* 1.37*  CALCIUM 9.3 9.0 8.8* 8.8*  MG 2.1  --   --   --    GFR Estimated Creatinine Clearance: 34 mL/min (by C-G formula based on Cr of 1.37). Liver Function Tests:  Recent Labs Lab 02/05/16 1205  AST 16  ALT 11*  ALKPHOS 83  BILITOT 0.7  PROT 6.2*  ALBUMIN 3.5   No results for input(s): LIPASE, AMYLASE in the last 168 hours. No  results for input(s): AMMONIA in the last 168 hours. Coagulation profile  Recent Labs Lab 02/05/16 1205 02/06/16 0300  INR 1.10 1.18    CBC:  Recent Labs Lab 02/05/16 1205 02/06/16 0300 02/07/16 0329  WBC 6.5 6.5 6.5  HGB 11.6* 10.3* 9.7*  HCT 39.2 34.8* 32.5*  MCV 86.0 85.3 86.0  PLT 202 197 181   Cardiac Enzymes:  Recent Labs Lab 02/06/16 1543 02/06/16 2113 02/07/16 0329  TROPONINI 0.17* <0.03 0.03   BNP: Invalid input(s): POCBNP CBG:  Recent Labs Lab 02/08/16 1137 02/08/16 1641 02/08/16 2102 02/09/16 0553 02/09/16 1129  GLUCAP 149* 119* 155* 107* 135*   D-Dimer No results for input(s): DDIMER in the last 72 hours. Hgb A1c No results for input(s): HGBA1C in the last 72 hours. Lipid Profile No results for  input(s): CHOL, HDL, LDLCALC, TRIG, CHOLHDL, LDLDIRECT in the last 72 hours. Thyroid function studies No results for input(s): TSH, T4TOTAL, T3FREE, THYROIDAB in the last 72 hours.  Invalid input(s): FREET3 Anemia work up No results for input(s): VITAMINB12, FOLATE, FERRITIN, TIBC, IRON, RETICCTPCT in the last 72 hours. Microbiology No results found for this or any previous visit (from the past 240 hour(s)).   Discharge Instructions:       Discharge Instructions    (HEART FAILURE PATIENTS) Call MD:  Anytime you have any of the following symptoms: 1) 3 pound weight gain in 24 hours or 5 pounds in 1 week 2) shortness of breath, with or without a dry hacking cough 3) swelling in the hands, feet or stomach 4) if you have to sleep on extra pillows at night in order to breathe.    Complete by:  As directed      Diet - low sodium heart healthy    Complete by:  As directed      Discharge instructions    Complete by:  As directed   BMP 1 week Home health- PT - vestibular     Increase activity slowly    Complete by:  As directed             Medication List    STOP taking these medications        ALAVERT 10 MG tablet  Generic drug:  loratadine     amLODipine 5 MG tablet  Commonly known as:  NORVASC     metoprolol succinate 25 MG 24 hr tablet  Commonly known as:  TOPROL-XL      TAKE these medications        ACCU-CHEK AVIVA PLUS test strip  Generic drug:  glucose blood  PATIENT TO CHECK SUGARS ONCE DAILY.     acetaminophen 500 MG tablet  Commonly known as:  TYLENOL  Take 500 mg by mouth every 6 (six) hours as needed for fever.     aspirin 325 MG tablet  Take 325 mg by mouth daily.     calcitRIOL 0.5 MCG capsule  Commonly known as:  ROCALTROL  Take 0.5 mcg by mouth 3 (three) times a week. Monday, Wednesday, and Friday only     carbamide peroxide 6.5 % otic solution  Commonly known as:  DEBROX  Place 5 drops into both ears 2 (two) times daily.     docusate sodium 100  MG capsule  Commonly known as:  COLACE  Take 100 mg by mouth daily as needed for mild constipation.     donepezil 10 MG tablet  Commonly known as:  ARICEPT  Take 10 mg by mouth at bedtime.  fluticasone 50 MCG/ACT nasal spray  Commonly known as:  FLONASE  Place 2 sprays into both nostrils daily.     furosemide 40 MG tablet  Commonly known as:  LASIX  Take 1 tablet (40 mg total) by mouth daily. Can cut 1 in half and take a PRN 20 mg for LE swelling, weight gain > 5lbs in 24 hour peroid     glimepiride 4 MG tablet  Commonly known as:  AMARYL  Take 4 mg by mouth daily with breakfast.     guaiFENesin 600 MG 12 hr tablet  Commonly known as:  MUCINEX  Take 600 mg by mouth 2 (two) times daily as needed for cough. Reported on 12/22/2015     meclizine 12.5 MG tablet  Commonly known as:  ANTIVERT  Take 1 tablet (12.5 mg total) by mouth 3 (three) times daily.     MYRBETRIQ 50 MG Tb24 tablet  Generic drug:  mirabegron ER  Take 50 mg by mouth daily.     potassium chloride SA 20 MEQ tablet  Commonly known as:  K-DUR,KLOR-CON  Take 20 mEq by mouth daily.     pravastatin 40 MG tablet  Commonly known as:  PRAVACHOL  Take 40 mg by mouth daily.     SYSTANE 0.4-0.3 % Soln  Generic drug:  Polyethyl Glycol-Propyl Glycol  Apply 1 drop to eye 2 (two) times daily as needed (dry eyes).     vitamin B-12 500 MCG tablet  Commonly known as:  CYANOCOBALAMIN  Take 500 mcg by mouth daily.     Vitamin D (Ergocalciferol) 50000 units Caps capsule  Commonly known as:  DRISDOL  Take 50,000 Units by mouth once a week. Mondays       Follow-up Information    Follow up with Haywood Pao, MD In 1 week.   Specialty:  Internal Medicine   Why:  Office will call patient with appointment   Contact information:   [address] [place] [address] [phone]        Time coordinating discharge: 35 min  Signed:  Maston Wight Alison Stalling   [facility] 02/09/2016, 3:05  PM

## 2016-02-09 NOTE — Telephone Encounter (Signed)
FYI pt is hospitalized in [place] had labs yesterday please review

## 2016-02-09 NOTE — Progress Notes (Signed)
    Subjective:  Confused; alert and oriented to person; Denies CP or dyspnea   Objective:  Filed Vitals:   02/08/16 1951 02/08/16 2111 02/09/16 0556 02/09/16 0900  BP: 131/63 135/42 146/49 131/41  Pulse: 60 55 55 58  Temp: 98.4 F (36.9 C) 99 F (37.2 C) 98 F (36.7 C) 98.7 F (37.1 C)  TempSrc: Oral Oral Oral Oral  Resp: 18 18 20 18   Height:      Weight:   214 lb 1.6 oz (97.115 kg)   SpO2:  98% 97% 97%    Intake/Output from previous day:  Intake/Output Summary (Last 24 hours) at 02/09/16 1029 Last data filed at 02/09/16 0600  Gross per 24 hour  Intake    670 ml  Output      0 ml  Net    670 ml    Physical Exam: Physical exam: Well-developed obese in no acute distress.  Skin is warm and dry.  HEENT is normal.  Neck is supple.  Chest is clear to auscultation with normal expansion.  Cardiovascular exam is regular rate and rhythm.  Abdominal exam nontender or distended. No masses palpated. Extremities show no edema. neuro confused    Lab Results: Basic Metabolic Panel:  Recent Labs  02/07/16 0329 02/08/16 1344  NA 139 141  K 3.5 4.3  CL 110 112*  CO2 24 25  GLUCOSE 115* 169*  BUN 18 14  CREATININE 1.46* 1.37*  CALCIUM 8.8* 8.8*   CBC:  Recent Labs  02/07/16 0329  WBC 6.5  HGB 9.7*  HCT 32.5*  MCV 86.0  PLT 181   Cardiac Enzymes:  Recent Labs  02/06/16 1543 02/06/16 2113 02/07/16 0329  TROPONINI 0.17* <0.03 0.03     Assessment/Plan:  1 near syncope-etiology unclear. History is difficult due to dementia. Note she did not have frank syncope. She has some bradycardia with heart rates in the 40s on telemetry but no long pauses. Beta blocker DCed. Would continue off. Could consider a monitor if she has recurrent symptoms in the future though given dementia not clear she would use appropriately. There has been some question of orthostasis. Her Lasix has been held. I/O + [phone] since admission. Resume at 40 mg daily with an additional 20 mg as  needed. 2 chronic diastolic congestive heart failure-+ I/O; weight 214. Resume Lasix at 40 mg daily with additional 20 mg as needed. Follow weights at home. BMET one week. 3 mild elevation in troponin-patient's daughter states she never had chest pain and patient has no recollection of events. Follow-up troponins are normal. This is not consistent with an acute coronary syndrome and she is not a good candidate for further ischemia evaluation. 4 hypertension-The patient's blood pressure at present appears to be reasonable off of medications. Would follow and could resume low-dose amlodipine if needed as outpt. 5 Dementia-appears to be significant. Pt should FU with Roderic Palau in atrial fibrillation clinic and Dr Allred following DC. Kirk Ruths 02/09/2016, 10:29 AM

## 2016-02-11 ENCOUNTER — Ambulatory Visit: Payer: Commercial Managed Care - HMO | Admitting: Endocrinology

## 2016-02-12 DIAGNOSIS — H6123 Impacted cerumen, bilateral: Secondary | ICD-10-CM | POA: Diagnosis not present

## 2016-02-12 DIAGNOSIS — Z6832 Body mass index (BMI) 32.0-32.9, adult: Secondary | ICD-10-CM | POA: Diagnosis not present

## 2016-02-12 DIAGNOSIS — H9203 Otalgia, bilateral: Secondary | ICD-10-CM | POA: Diagnosis not present

## 2016-02-12 DIAGNOSIS — R42 Dizziness and giddiness: Secondary | ICD-10-CM | POA: Diagnosis not present

## 2016-02-15 ENCOUNTER — Telehealth (HOSPITAL_COMMUNITY): Payer: Self-pay | Admitting: *Deleted

## 2016-02-15 MED ORDER — FUROSEMIDE 40 MG PO TABS: 20.0000 mg | ORAL_TABLET | Freq: Every day | ORAL | Status: DC

## 2016-02-15 NOTE — Telephone Encounter (Signed)
Daughter called in concerned regarding lasix dosing of 40mg  daily. States her mother's weight has been stable since being home but she is not voiding much and seem to be dehydrated. The daughter held her lasix today and yesterday due to decreased urination with decreased PO intake and noticed mucus membranes to be extremely dry. BP running 140-150/70-80 HR 70-80s. Dizziness is better. Has follow up with PCP later this week instructed to watch BP over next several days and discuss with PCP regarding restarting any BP medications but would decrease lasix to 20mg  once a day with taking extra 20mg  if increased weight. Patient daughter verbalized understanding.

## 2016-02-16 ENCOUNTER — Telehealth: Payer: Self-pay | Admitting: Internal Medicine

## 2016-02-17 ENCOUNTER — Encounter: Payer: Self-pay | Admitting: Internal Medicine

## 2016-02-17 ENCOUNTER — Other Ambulatory Visit (INDEPENDENT_AMBULATORY_CARE_PROVIDER_SITE_OTHER): Payer: Commercial Managed Care - HMO

## 2016-02-17 ENCOUNTER — Ambulatory Visit (INDEPENDENT_AMBULATORY_CARE_PROVIDER_SITE_OTHER): Payer: Commercial Managed Care - HMO | Admitting: Internal Medicine

## 2016-02-17 VITALS — BP 152/64 | HR 57 | Ht 65.0 in | Wt 204.4 lb

## 2016-02-17 DIAGNOSIS — T462X5A Adverse effect of other antidysrhythmic drugs, initial encounter: Secondary | ICD-10-CM

## 2016-02-17 DIAGNOSIS — I4819 Other persistent atrial fibrillation: Secondary | ICD-10-CM

## 2016-02-17 DIAGNOSIS — I481 Persistent atrial fibrillation: Secondary | ICD-10-CM

## 2016-02-17 DIAGNOSIS — I10 Essential (primary) hypertension: Secondary | ICD-10-CM | POA: Diagnosis not present

## 2016-02-17 DIAGNOSIS — I480 Paroxysmal atrial fibrillation: Secondary | ICD-10-CM

## 2016-02-17 DIAGNOSIS — E0580 Other thyrotoxicosis without thyrotoxic crisis or storm: Secondary | ICD-10-CM

## 2016-02-17 LAB — CBC WITH DIFFERENTIAL/PLATELET
BASOS PCT: 1 %
Basophils Absolute: 54 cells/uL (ref 0–200)
EOS ABS: 216 {cells}/uL (ref 15–500)
Eosinophils Relative: 4 %
HEMATOCRIT: 35 % (ref 35.0–45.0)
HEMOGLOBIN: 11 g/dL — AB (ref 11.7–15.5)
LYMPHS ABS: 1620 {cells}/uL (ref 850–3900)
LYMPHS PCT: 30 %
MCH: 26.7 pg — ABNORMAL LOW (ref 27.0–33.0)
MCHC: 31.4 g/dL — AB (ref 32.0–36.0)
MCV: 85 fL (ref 80.0–100.0)
MONO ABS: 540 {cells}/uL (ref 200–950)
MPV: 9.9 fL (ref 7.5–12.5)
Monocytes Relative: 10 %
NEUTROS PCT: 55 %
Neutro Abs: 2970 cells/uL (ref 1500–7800)
Platelets: 176 10*3/uL (ref 140–400)
RBC: 4.12 MIL/uL (ref 3.80–5.10)
RDW: 15 % (ref 11.0–15.0)
WBC: 5.4 10*3/uL (ref 3.8–10.8)

## 2016-02-17 LAB — BASIC METABOLIC PANEL
BUN: 21 mg/dL (ref 7–25)
CHLORIDE: 106 mmol/L (ref 98–110)
CO2: 18 mmol/L — AB (ref 20–31)
Calcium: 8.9 mg/dL (ref 8.6–10.4)
Creat: 1.52 mg/dL — ABNORMAL HIGH (ref 0.60–0.88)
GLUCOSE: 169 mg/dL — AB (ref 65–99)
POTASSIUM: 4.7 mmol/L (ref 3.5–5.3)
SODIUM: 139 mmol/L (ref 135–146)

## 2016-02-17 LAB — TSH: TSH: 0.38 u[IU]/mL (ref 0.35–4.50)

## 2016-02-17 LAB — T4, FREE: Free T4: 1.12 ng/dL (ref 0.60–1.60)

## 2016-02-17 LAB — T3, FREE: T3, Free: 2.8 pg/mL (ref 2.3–4.2)

## 2016-02-17 NOTE — Progress Notes (Signed)
Electrophysiology Office Note   Date:  02/17/2016   ID:  Anna [name], DOB 1929/06/21, MRN [id number]  PCP:   Dr Osborne Casco  Chief Complaint  Patient presents with  . Atrial Fibrillation     History of Present Illness: Anna [name] is a 82 y.o. female who presents today for electrophysiology evaluation.   She has dementia and continues to decline.  Afib seems well controlled.  She did have a recent admission with sinus bradycardia noted.  Medicines were adjusted and daughter reports heart rates 50s-60.  She is checking her BP too much.  The daughter does not seem to recognize that the patients decline is an expected part of her natural health decline.  The patient is not very interactive today.  No complaints.  Her daughter feels that she does have blurred vision though this seems unlikely to be cardiac in etiology. The patient is unable to provide ROS.  he patient is tolerating medications without difficulties and is otherwise without complaint today.    Past Medical History  Diagnosis Date  . CHF (congestive heart failure) ([place])   . Persistent atrial fibrillation ([place])   . Chronic renal insufficiency, stage III (moderate)   . Diabetes mellitus without complication ([place])   . Hypertension   . Dementia   . LBBB (left bundle branch block)   . Overactive bladder   . GI bleeding 11/2013    due to supratherapeutic INR  . Hyperlipidemia   . Vitamin D deficiency   . Vitamin B12 deficiency   . Hyperthyroidism     on amiodarone  . Overweight    Past Surgical History  Procedure Laterality Date  . Abdominal hysterectomy       Current Outpatient Prescriptions  Medication Sig Dispense Refill  . ACCU-CHEK AVIVA PLUS test strip PATIENT TO CHECK SUGARS ONCE DAILY.  3  . acetaminophen (TYLENOL) 500 MG tablet Take 500 mg by mouth every 6 (six) hours as needed for fever.     Marland Kitchen aspirin 325 MG tablet Take 325 mg by mouth daily.    . calcitRIOL (ROCALTROL) 0.5 MCG capsule Take 0.5  mcg by mouth 3 (three) times a week. Monday, Wednesday, and Friday only    . docusate sodium (COLACE) 100 MG capsule Take 100 mg by mouth daily as needed for mild constipation.    Marland Kitchen donepezil (ARICEPT) 10 MG tablet Take 10 mg by mouth at bedtime.    . fluticasone (FLONASE) 50 MCG/ACT nasal spray Place 2 sprays into both nostrils daily.    . furosemide (LASIX) 20 MG tablet Take 1 tablet by mouth as needed.    Marland Kitchen glimepiride (AMARYL) 4 MG tablet Take 4 mg by mouth daily with breakfast.    . guaiFENesin (MUCINEX) 600 MG 12 hr tablet Take 600 mg by mouth 2 (two) times daily as needed for cough. Reported on 12/22/2015    . MYRBETRIQ 50 MG TB24 tablet Take 50 mg by mouth daily.     Vladimir Faster Glycol-Propyl Glycol (SYSTANE) 0.4-0.3 % SOLN Apply 1 drop to eye 2 (two) times daily as needed (dry eyes).    . potassium chloride SA (K-DUR,KLOR-CON) 20 MEQ tablet Take 10 mEq by mouth daily.    . pravastatin (PRAVACHOL) 40 MG tablet Take 40 mg by mouth daily.    . vitamin B-12 (CYANOCOBALAMIN) 500 MCG tablet Take 500 mcg by mouth daily.    . Vitamin D, Ergocalciferol, (DRISDOL) 50000 UNITS CAPS capsule Take 50,000 Units by mouth once a  week. Mondays    . carbamide peroxide (DEBROX) 6.5 % otic solution Place 5 drops into both ears 2 (two) times daily. (Patient not taking: Reported on 02/17/2016) 15 mL 0   No current facility-administered medications for this visit.    Allergies:   Pollen extract   Social History:  The patient  reports that she has never smoked. She does not have any smokeless tobacco history on file. She reports that she does not drink alcohol or use illicit drugs.   Family History:  The patient's  family history includes Diabetes in her mother; Heart disease in her father. There is no history of Thyroid disease.    ROS:  Pt is unable to provide   PHYSICAL EXAM: VS:  BP 152/64 mmHg  Pulse 57  Ht 5\' 5"  (1.651 m)  Wt 204 lb 6.4 oz (92.715 kg)  BMI 34.01 kg/m2 , BMI Body mass index is  34.01 kg/(m^2). GEN: overweight and elderly, in no acute distress, does not talk much but responds to questions, history is per her daughter who talks for her HEENT: normal Neck: no JVD, carotid bruits, or masses Cardiac: RRR; no murmurs, rubs, or gallops,+ dependant edema  Respiratory:  clear to auscultation bilaterally, normal work of breathing GI: soft, nontender, nondistended, + BS MS: no deformity or atrophy Skin: warm and dry  Neuro:  Strength and sensation are intact Psych: euthymic mood, full affect  EKG:  EKG is ordered today. The ekg ordered today shows sinus rhythm 57 bpm, PR 248 msec, LBBB, PVCs   Recent Labs: 02/05/2016: ALT 11*; B Natriuretic Peptide 117.7*; Magnesium 2.1 02/07/2016: Hemoglobin 9.7*; Platelets 181 02/08/2016: BUN 14; Creatinine, Ser 1.37*; Potassium 4.3; Sodium 141 02/17/2016: TSH 0.38    Lipid Panel  No results found for: CHOL, TRIG, HDL, CHOLHDL, VLDL, LDLCALC, LDLDIRECT   Wt Readings from Last 3 Encounters:  02/17/16 204 lb 6.4 oz (92.715 kg)  02/09/16 214 lb 1.6 oz (97.115 kg)  01/11/16 211 lb 6.4 oz (95.89 kg)      Other studies Reviewed: Additional studies/ records that were reviewed today include: recent hospital records are reviewed Review of the above records today demonstrates: as above   ASSESSMENT AND PLAN:  1.  Persistent afib Currently in sinus Not a candidate for anticoagulation  2. Chronic diastolic dysfunction euvolemic today 2 gram sodium restriction Daily weights  3. HTN Some elevation at home.  I have reassured her daughter that we should not check BP more than a couple times per week and that our goals are SBP 150 and DBP 90.  No changes advised today.  Could consider amlodipine 2.5mg  daily if BP elevates significantly.  4. Sinus bradycardia Stable Poor candidate for any EP procedures Would manage conservatively  5. Code status I have encouraged a comfort care/ palliative approach.  I do not think that her  daughter is ready for this. The patient and family report that her wishes are DNI but that they would want chest compressions or shocks "one time" to see if it worked.  She will follow-up in the AF clinic every 3 months going forward and I will see as needed   Current medicines are reviewed at length with the patient today.   The patient does not have concerns regarding her medicines.  The following changes were made today:  none   Signed, Thompson Grayer, MD  02/17/2016 5:49 PM     [address]  [place] [address] [phone] (office) [phone] (  fax)

## 2016-02-17 NOTE — Patient Instructions (Addendum)
Medication Instructions:  Your physician has recommended you make the following change in your medication:  Take Furosemide as needed only--weigh daily   Labwork: Your physician recommends that you return for lab work today: BMP/CBC   Testing/Procedures: None ordered   Follow-Up: Your physician recommends that you schedule a follow-up appointment as scheduled   Any Other Special Instructions Will Be Listed Below (If Applicable).  Low-Sodium Eating Plan Sodium raises blood pressure and causes water to be held in the body. Getting less sodium from food will help lower your blood pressure, reduce any swelling, and protect your heart, liver, and kidneys. We get sodium by adding salt (sodium chloride) to food. Most of our sodium comes from canned, boxed, and frozen foods. Restaurant foods, fast foods, and pizza are also very high in sodium. Even if you take medicine to lower your blood pressure or to reduce fluid in your body, getting less sodium from your food is important. WHAT IS MY PLAN? Most people should limit their sodium intake to 2,300 mg a day. Your health care provider recommends that you limit your sodium intake to 2 gram a day.  WHAT DO I NEED TO KNOW ABOUT THIS EATING PLAN? For the low-sodium eating plan, you will follow these general guidelines:  Choose foods with a % Daily Value for sodium of less than 5% (as listed on the food label).   Use salt-free seasonings or herbs instead of table salt or sea salt.   Check with your health care provider or pharmacist before using salt substitutes.   Eat fresh foods.  Eat more vegetables and fruits.  Limit canned vegetables. If you do use them, rinse them well to decrease the sodium.   Limit cheese to 1 oz (28 g) per day.   Eat lower-sodium products, often labeled as "lower sodium" or "no salt added."  Avoid foods that contain monosodium glutamate (MSG). MSG is sometimes added to [place] food and some canned  foods.  Check food labels (Nutrition Facts labels) on foods to learn how much sodium is in one serving.  Eat more home-cooked food and less restaurant, buffet, and fast food.  When eating at a restaurant, ask that your food be prepared with less salt, or no salt if possible.  HOW DO I READ FOOD LABELS FOR SODIUM INFORMATION? The Nutrition Facts label lists the amount of sodium in one serving of the food. If you eat more than one serving, you must multiply the listed amount of sodium by the number of servings. Food labels may also identify foods as:  Sodium free--Less than 5 mg in a serving.  Very low sodium--35 mg or less in a serving.  Low sodium--140 mg or less in a serving.  Light in sodium--50% less sodium in a serving. For example, if a food that usually has 300 mg of sodium is changed to become light in sodium, it will have 150 mg of sodium.  Reduced sodium--25% less sodium in a serving. For example, if a food that usually has 400 mg of sodium is changed to reduced sodium, it will have 300 mg of sodium. WHAT FOODS CAN I EAT? Grains Low-sodium cereals, including oats, puffed wheat and rice, and shredded wheat cereals. Low-sodium crackers. Unsalted rice and pasta. Lower-sodium bread.  Vegetables Frozen or fresh vegetables. Low-sodium or reduced-sodium canned vegetables. Low-sodium or reduced-sodium tomato sauce and paste. Low-sodium or reduced-sodium tomato and vegetable juices.  Fruits Fresh, frozen, and canned fruit. Fruit juice.  Meat and Other Protein Products  Low-sodium canned tuna and salmon. Fresh or frozen meat, poultry, seafood, and fish. Lamb. Unsalted nuts. Dried beans, peas, and lentils without added salt. Unsalted canned beans. Homemade soups without salt. Eggs.  Dairy Milk. Soy milk. Ricotta cheese. Low-sodium or reduced-sodium cheeses. Yogurt.  Condiments Fresh and dried herbs and spices. Salt-free seasonings. Onion and garlic powders. Low-sodium varieties  of mustard and ketchup. Fresh or refrigerated horseradish. Lemon juice.  Fats and Oils Reduced-sodium salad dressings. Unsalted butter.  Other Unsalted popcorn and pretzels.  The items listed above may not be a complete list of recommended foods or beverages. Contact your dietitian for more options. WHAT FOODS ARE NOT RECOMMENDED? Grains Instant hot cereals. Bread stuffing, pancake, and biscuit mixes. Croutons. Seasoned rice or pasta mixes. Noodle soup cups. Boxed or frozen macaroni and cheese. Self-rising flour. Regular salted crackers. Vegetables Regular canned vegetables. Regular canned tomato sauce and paste. Regular tomato and vegetable juices. Frozen vegetables in sauces. Salted [place] fries. Olives. Angie Fava. Relishes. Sauerkraut. Salsa. Meat and Other Protein Products Salted, canned, smoked, spiced, or pickled meats, seafood, or fish. Bacon, ham, sausage, hot dogs, corned beef, chipped beef, and packaged luncheon meats. Salt pork. Jerky. Pickled herring. Anchovies, regular canned tuna, and sardines. Salted nuts. Dairy Processed cheese and cheese spreads. Cheese curds. Blue cheese and cottage cheese. Buttermilk.  Condiments Onion and garlic salt, seasoned salt, table salt, and sea salt. Canned and packaged gravies. Worcestershire sauce. Tartar sauce. Barbecue sauce. Teriyaki sauce. Soy sauce, including reduced sodium. Steak sauce. Fish sauce. Oyster sauce. Cocktail sauce. Horseradish that you find on the shelf. Regular ketchup and mustard. Meat flavorings and tenderizers. Bouillon cubes. Hot sauce. Tabasco sauce. Marinades. Taco seasonings. Relishes. Fats and Oils Regular salad dressings. Salted butter. Margarine. Ghee. Bacon fat.  Other Potato and tortilla chips. Corn chips and puffs. Salted popcorn and pretzels. Canned or dried soups. Pizza. Frozen entrees and pot pies.  The items listed above may not be a complete list of foods and beverages to avoid. Contact your dietitian  for more information.   This information is not intended to replace advice given to you by your health care provider. Make sure you discuss any questions you have with your health care provider.   Document Released: 01/28/2002 Document Revised: 08/29/2014 Document Reviewed: 06/12/2013 Elsevier Interactive Patient Education [employer].     If you need a refill on your cardiac medications before your next appointment, please call your pharmacy.

## 2016-02-18 NOTE — Progress Notes (Signed)
Quick Note:  Please let the daughter know that the lab result is normal and no further action needed, she can continue to follow-up with primary care physician ______

## 2016-02-24 ENCOUNTER — Telehealth: Payer: Self-pay | Admitting: Internal Medicine

## 2016-02-24 DIAGNOSIS — R300 Dysuria: Secondary | ICD-10-CM | POA: Diagnosis not present

## 2016-02-24 DIAGNOSIS — N390 Urinary tract infection, site not specified: Secondary | ICD-10-CM | POA: Diagnosis not present

## 2016-02-24 NOTE — Telephone Encounter (Signed)
I spoke with patient's daughter, Anna [name], with pt's verbal permission.

## 2016-02-24 NOTE — Telephone Encounter (Signed)
Discussed lab results from 02/17/16 with pt's daughter, [name], will forward copy to Dr Osborne Casco, her PCP.

## 2016-02-24 NOTE — Telephone Encounter (Signed)
Calling to get her Mother's lab results from Wednesday of last week. Please call   Thanks

## 2016-02-25 DIAGNOSIS — H2513 Age-related nuclear cataract, bilateral: Secondary | ICD-10-CM | POA: Diagnosis not present

## 2016-02-25 DIAGNOSIS — H04123 Dry eye syndrome of bilateral lacrimal glands: Secondary | ICD-10-CM | POA: Diagnosis not present

## 2016-02-26 ENCOUNTER — Telehealth: Payer: Self-pay

## 2016-02-26 NOTE — Telephone Encounter (Signed)
Called patient daughter to notify her of her mothers lab results. No voicemail left as mailbox was too full. Will try again later.

## 2016-02-29 ENCOUNTER — Telehealth: Payer: Self-pay

## 2016-02-29 NOTE — Telephone Encounter (Signed)
Tried to call patient daughter and notify her when they come back for follow ups to also get lab draws. Patient did not answer, no way to leave voicemail. Will try again later.

## 2016-03-08 ENCOUNTER — Ambulatory Visit: Payer: Commercial Managed Care - HMO | Admitting: Internal Medicine

## 2016-03-08 DIAGNOSIS — D692 Other nonthrombocytopenic purpura: Secondary | ICD-10-CM | POA: Diagnosis not present

## 2016-03-08 DIAGNOSIS — R5381 Other malaise: Secondary | ICD-10-CM | POA: Diagnosis not present

## 2016-03-08 DIAGNOSIS — I130 Hypertensive heart and chronic kidney disease with heart failure and stage 1 through stage 4 chronic kidney disease, or unspecified chronic kidney disease: Secondary | ICD-10-CM | POA: Diagnosis not present

## 2016-03-08 DIAGNOSIS — E538 Deficiency of other specified B group vitamins: Secondary | ICD-10-CM | POA: Diagnosis not present

## 2016-03-08 DIAGNOSIS — E0590 Thyrotoxicosis, unspecified without thyrotoxic crisis or storm: Secondary | ICD-10-CM | POA: Diagnosis not present

## 2016-03-08 DIAGNOSIS — E1129 Type 2 diabetes mellitus with other diabetic kidney complication: Secondary | ICD-10-CM | POA: Diagnosis not present

## 2016-03-08 DIAGNOSIS — E559 Vitamin D deficiency, unspecified: Secondary | ICD-10-CM | POA: Diagnosis not present

## 2016-03-08 DIAGNOSIS — R627 Adult failure to thrive: Secondary | ICD-10-CM | POA: Diagnosis not present

## 2016-03-08 DIAGNOSIS — I10 Essential (primary) hypertension: Secondary | ICD-10-CM | POA: Diagnosis not present

## 2016-03-08 DIAGNOSIS — Z6833 Body mass index (BMI) 33.0-33.9, adult: Secondary | ICD-10-CM | POA: Diagnosis not present

## 2016-03-21 DIAGNOSIS — N3281 Overactive bladder: Secondary | ICD-10-CM | POA: Diagnosis not present

## 2016-03-21 DIAGNOSIS — N390 Urinary tract infection, site not specified: Secondary | ICD-10-CM | POA: Diagnosis not present

## 2016-03-25 ENCOUNTER — Telehealth (HOSPITAL_COMMUNITY): Payer: Self-pay | Admitting: *Deleted

## 2016-03-25 NOTE — Telephone Encounter (Signed)
Pt daughter called in concerned her mother's O2 sat was reading 90-92% (its normally 95-97% as she checks it daily). Pt seems more congested with coughing. Takes lasix normally as needed she has required it daily for the past week. Instructed daughter to try 1/2 tablet (10mg ) of lasix now and see if this improves symptoms. Pt does not seem more short of breath or fever at present time. If continues with congestion and lowered O2 sat may need to be seen by PCP or urgent care for further assessment.  Daughter questioned if she should take her mother to the ER at this time - instructed if O2 sat started to be below 88% consistently or coughing/congestion increased or fever then ER visit would be appropriate at that time. Pt daughter verbalized understanding.

## 2016-04-07 DIAGNOSIS — N390 Urinary tract infection, site not specified: Secondary | ICD-10-CM | POA: Diagnosis not present

## 2016-04-07 DIAGNOSIS — R300 Dysuria: Secondary | ICD-10-CM | POA: Diagnosis not present

## 2016-04-11 ENCOUNTER — Inpatient Hospital Stay (HOSPITAL_COMMUNITY)
Admission: RE | Admit: 2016-04-11 | Payer: Commercial Managed Care - HMO | Source: Ambulatory Visit | Admitting: Nurse Practitioner

## 2016-04-11 ENCOUNTER — Telehealth (HOSPITAL_COMMUNITY): Payer: Self-pay | Admitting: *Deleted

## 2016-04-11 NOTE — Telephone Encounter (Signed)
Pts daughter called to reschedule patient appt to Thurs 08/24.

## 2016-04-13 ENCOUNTER — Ambulatory Visit (HOSPITAL_COMMUNITY): Payer: Commercial Managed Care - HMO | Admitting: Nurse Practitioner

## 2016-04-14 ENCOUNTER — Ambulatory Visit (HOSPITAL_COMMUNITY)
Admission: RE | Admit: 2016-04-14 | Discharge: 2016-04-14 | Disposition: A | Discharge status: Home / Self Care | Payer: Commercial Managed Care - HMO | Source: Ambulatory Visit | Attending: Nurse Practitioner | Admitting: Nurse Practitioner

## 2016-04-14 ENCOUNTER — Encounter (HOSPITAL_COMMUNITY): Payer: Self-pay | Admitting: Nurse Practitioner

## 2016-04-14 VITALS — BP 126/70 | Ht 65.0 in | Wt 209.8 lb

## 2016-04-14 DIAGNOSIS — Z833 Family history of diabetes mellitus: Secondary | ICD-10-CM | POA: Insufficient documentation

## 2016-04-14 DIAGNOSIS — I130 Hypertensive heart and chronic kidney disease with heart failure and stage 1 through stage 4 chronic kidney disease, or unspecified chronic kidney disease: Secondary | ICD-10-CM | POA: Insufficient documentation

## 2016-04-14 DIAGNOSIS — I480 Paroxysmal atrial fibrillation: Secondary | ICD-10-CM | POA: Insufficient documentation

## 2016-04-14 DIAGNOSIS — Z7984 Long term (current) use of oral hypoglycemic drugs: Secondary | ICD-10-CM | POA: Insufficient documentation

## 2016-04-14 DIAGNOSIS — Z9181 History of falling: Secondary | ICD-10-CM | POA: Insufficient documentation

## 2016-04-14 DIAGNOSIS — I509 Heart failure, unspecified: Secondary | ICD-10-CM | POA: Diagnosis not present

## 2016-04-14 DIAGNOSIS — E785 Hyperlipidemia, unspecified: Secondary | ICD-10-CM | POA: Insufficient documentation

## 2016-04-14 DIAGNOSIS — I481 Persistent atrial fibrillation: Secondary | ICD-10-CM

## 2016-04-14 DIAGNOSIS — N183 Chronic kidney disease, stage 3 (moderate): Secondary | ICD-10-CM | POA: Insufficient documentation

## 2016-04-14 DIAGNOSIS — Z8249 Family history of ischemic heart disease and other diseases of the circulatory system: Secondary | ICD-10-CM | POA: Diagnosis not present

## 2016-04-14 DIAGNOSIS — E0590 Thyrotoxicosis, unspecified without thyrotoxic crisis or storm: Secondary | ICD-10-CM | POA: Diagnosis not present

## 2016-04-14 DIAGNOSIS — F0280 Dementia in other diseases classified elsewhere without behavioral disturbance: Secondary | ICD-10-CM | POA: Diagnosis not present

## 2016-04-14 DIAGNOSIS — E1122 Type 2 diabetes mellitus with diabetic chronic kidney disease: Secondary | ICD-10-CM | POA: Insufficient documentation

## 2016-04-14 DIAGNOSIS — Z7982 Long term (current) use of aspirin: Secondary | ICD-10-CM | POA: Insufficient documentation

## 2016-04-14 DIAGNOSIS — Z79899 Other long term (current) drug therapy: Secondary | ICD-10-CM | POA: Insufficient documentation

## 2016-04-14 DIAGNOSIS — G309 Alzheimer's disease, unspecified: Secondary | ICD-10-CM | POA: Diagnosis not present

## 2016-04-14 DIAGNOSIS — I4819 Other persistent atrial fibrillation: Secondary | ICD-10-CM

## 2016-04-14 LAB — BASIC METABOLIC PANEL
Anion gap: 7 (ref 5–15)
BUN: 23 mg/dL — ABNORMAL HIGH (ref 6–20)
CHLORIDE: 107 mmol/L (ref 101–111)
CO2: 26 mmol/L (ref 22–32)
CREATININE: 1.53 mg/dL — AB (ref 0.44–1.00)
Calcium: 9.6 mg/dL (ref 8.9–10.3)
GFR calc Af Amer: 35 mL/min — ABNORMAL LOW (ref >60)
GFR, EST NON AFRICAN AMERICAN: 30 mL/min — AB (ref >60)
Glucose, Bld: 136 mg/dL — ABNORMAL HIGH (ref 65–99)
Potassium: 4 mmol/L (ref 3.5–5.1)
SODIUM: 140 mmol/L (ref 135–145)

## 2016-04-14 NOTE — Progress Notes (Signed)
Patient ID: Anna [name], female   DOB: [birth year], 82 y.o.   MRN: [id number]      Primary Care Physician: Anna Pao, MD Referring Physician: Dr. Sandria Manly Anna [name] is a 82 y.o. female with a h/o PAF. Initially seen 9/16. She recently moved to [place] from [place] to be near her daughter. She has dementia and requires significant care. She has a h/o persistent afib for which she was previously managed with coumadin and amiodarone. She developed hyperthyroidism and her amiodarone was discontinued. She had difficulty with supratherapeutic INR due to confusions with her previous anticoagulation clinic in [place] and had a GI bleed in 2015. She has been off of coumadin and has been on only ASA since that time. She has had prior bradycardia and thus her beta blocker has been weaned over time. She has has some difficulty with SOB as well as BLE edema. Echo 01/28/15 revealed EF 55%, mild LVH, LA size 41 mm, no significant arrhythmias. She has now been placed on toprol XL 12.5 mg daily and is doing "very well" per pts daughter. The daughter has not noticed  any symptoms which would indicate any afib in the mother. She thinks she is asymptomatic when she does have afib.She does have occasional falls. The family wishes to keep her off anticoagulants.  Afib clinc for f/u 12/29. The daughter has not noticed any afib, but she has had some difficulties with upper respiratory symptoms over the last month. She took two rounds of antibiotics with symptoms of cough, shortness of breath with rest and exertion not improving. The daughter noticed that her abdomen appeared larger so on her own increased her mother's doe of lasix to 60 mg a day from 40 mg daily. Within a few days, her symptoms improved. She then went back to 40 mg on Saturday, but by Tuesday the symptoms returned, so she increased the lasix again and today, she is improved. The pt has an appointment with her PCP tomorrow and wants to  discuss if she can stay on 60 mg a day. She is in S brady which is her usual. NO PND/orthopnea. No pedal edema.  Returns to afib clinic 2/20, she had a hospitalization 1/4 for UTI. She also has been found to have hyperactive thyroid and has been started on appropriate meds for this. The pt thinks her mom is doing much better. She has have better energy. The daughter has cut back on her mom's BB to a quarter of a tablet, she does have baseline Sinus brady with LBBB, which is stable.  HR can increase to around 100 with walking but there has been no evidence of afib.  Clinic visit 5/22, per daughter and review of records thyroid has stabilized. She has some ongoing issues with fluid retention and daughters concern if she is over or under hydrated. Very hard to weight daily.She will adjust her lasix between 40-60 mg a day from her symptoms. Bmet done recently showed stable renal status with Creat cl around 1.6. She looks normovolemic in office today. She does not participate in the clinic conversation but is alert. She is in sinus brady today. No reason to think afib has been present.  Afib clinic visit 8/24, pt is in [place]. Overall pt is stable appearing. Daughter adjusts lasix as needed for fluid status. Duaghter speaks for the mother due to lack of communication secondary to  the alzheimers. Currently is stable on 30 mg a day but does have tendency toward  dehydration so will obtain bmet. Daughter states she frequently will have some mild congestion but lungs are clear and PO is 100 %. Daughter noted one day the the patients HR was in the 40's and she appeared sluggish.  Today, daughter for the mother denies symptoms of palpitations, chest pain, shortness of breath, orthopnea, PND, lower extremity edema, dizziness, presyncope, syncope, or neurologic sequela. The patient is tolerating medications without difficulties and is otherwise without complaint today.   Past Medical History:  Diagnosis Date  . CHF  (congestive heart failure) ([place])   . Chronic renal insufficiency, stage III (moderate)   . Dementia   . Diabetes mellitus without complication ([place])   . GI bleeding 11/2013   due to supratherapeutic INR  . Hyperlipidemia   . Hypertension   . Hyperthyroidism    on amiodarone  . LBBB (left bundle branch block)   . Overactive bladder   . Overweight   . Persistent atrial fibrillation ([place])   . Vitamin B12 deficiency   . Vitamin D deficiency    Past Surgical History:  Procedure Laterality Date  . ABDOMINAL HYSTERECTOMY      Current Outpatient Prescriptions  Medication Sig Dispense Refill  . ACCU-CHEK AVIVA PLUS test strip PATIENT TO CHECK SUGARS ONCE DAILY.  3  . acetaminophen (TYLENOL) 500 MG tablet Take 500 mg by mouth every 6 (six) hours as needed for fever.     Marland Kitchen aspirin 325 MG tablet Take 325 mg by mouth daily.    . calcitRIOL (ROCALTROL) 0.5 MCG capsule Take 0.5 mcg by mouth 3 (three) times a week. Monday, Wednesday, and Friday only    . docusate sodium (COLACE) 100 MG capsule Take 100 mg by mouth daily as needed for mild constipation.    Marland Kitchen donepezil (ARICEPT) 10 MG tablet Take 10 mg by mouth at bedtime.    . fluticasone (FLONASE) 50 MCG/ACT nasal spray Place 2 sprays into both nostrils daily.    . furosemide (LASIX) 20 MG tablet Take 1.5 tablets by mouth as needed.     Marland Kitchen glimepiride (AMARYL) 2 MG tablet Take 2 mg by mouth daily with breakfast.    . guaiFENesin (MUCINEX) 600 MG 12 hr tablet Take 600 mg by mouth 2 (two) times daily as needed for cough. Reported on 12/22/2015    . loratadine (ALAVERT) 10 MG tablet Take 10 mg by mouth. Every 2 days    . MYRBETRIQ 50 MG TB24 tablet Take 50 mg by mouth daily.     Vladimir Faster Glycol-Propyl Glycol (SYSTANE) 0.4-0.3 % SOLN Apply 1 drop to eye 2 (two) times daily as needed (dry eyes).    . potassium chloride SA (K-DUR,KLOR-CON) 20 MEQ tablet Take 10 mEq by mouth daily.    . pravastatin (PRAVACHOL) 40 MG tablet Take 40 mg by mouth daily.      . vitamin B-12 (CYANOCOBALAMIN) 500 MCG tablet Take 500 mcg by mouth daily.    . Vitamin D, Ergocalciferol, (DRISDOL) 50000 UNITS CAPS capsule Take 50,000 Units by mouth once a week. Mondays    . carbamide peroxide (DEBROX) 6.5 % otic solution Place 5 drops into both ears 2 (two) times daily. (Patient not taking: Reported on 04/14/2016) 15 mL 0   No current facility-administered medications for this encounter.     Allergies  Allergen Reactions  . Pollen Extract     Sneezing, watery eyes    Social History   Social History  . Marital status: Unknown    Spouse name: N/A  .  Number of children: N/A  . Years of education: N/A   Occupational History  . Not on file.   Social History Main Topics  . Smoking status: Never Smoker  . Smokeless tobacco: Not on file  . Alcohol use No  . Drug use: No  . Sexual activity: Not on file   Other Topics Concern  . Not on file   Social History Narrative   Pt recently moved to [place] with daughter from [place]       Family History  Problem Relation Age of Onset  . Heart disease Father   . Diabetes Mother   . Thyroid disease Neg Hx     ROS- All systems are reviewed and negative except as per the HPI above  Physical Exam: Vitals:   04/14/16 1431  BP: 126/70  Weight: 209 lb 12.8 oz (95.2 kg)  Height: 5\' 5"  (1.651 m)    GEN- The patient is well appearing, alert and oriented x 3 today.   Head- normocephalic, atraumatic Eyes-  Sclera clear, conjunctiva pink Ears- hearing intact Oropharynx- clear Neck- supple, no JVP Lymph- no cervical lymphadenopathy Lungs- Clear to ausculation bilaterally, normal work of breathing Heart- Slow, regular rate and rhythm, no murmurs, rubs or gallops, PMI not laterally displaced GI- soft, NT, ND, + BS Extremities- no clubbing, cyanosis, or edema MS- no significant deformity or atrophy Skin- no rash or lesion Psych- euthymic mood, full affect Neuro- strength and sensation are intact  EKG-  sinus brady with first degree AVB with PAC's with aberrant conduction LBBB pr int 260 ms, qrs int 154 ms, qtc 455 ms Epic records reviewed Bmet-1.60 ms, BUN 27, glucose 173, gfr 32.55    Assessment and Plan: 1. PAF Maintaining SR Off BB Continue asa per family's wishes, understanding that asa does not protect against stroke per guidelines, but GI bleed in the past on warfarin. If HR is found to be in the 40's again, call office and a monitor will be placed.  2.CHF Stable On lasix 30 mg daily bmet  3. Hyperthyroidism Improved with treatment  F/u with Endocrinologist  F/u with 3 months  Butch Penny C. Taline Nass, [facility] [address] [place], [place] [address] [phone]

## 2016-04-15 ENCOUNTER — Telehealth (HOSPITAL_COMMUNITY): Payer: Self-pay | Admitting: *Deleted

## 2016-04-15 DIAGNOSIS — N390 Urinary tract infection, site not specified: Secondary | ICD-10-CM | POA: Diagnosis not present

## 2016-04-15 DIAGNOSIS — R300 Dysuria: Secondary | ICD-10-CM | POA: Diagnosis not present

## 2016-04-15 NOTE — Telephone Encounter (Signed)
-----   Message from Sherran Needs, NP sent at 04/15/2016  8:32 AM EDT ----- K+ stable, renal function, creatinine unchanged,BUN up slightly, encourage an extra glass of water for the next couple of days

## 2016-04-15 NOTE — Telephone Encounter (Signed)
I cld and spoke to the patients daughter Anna [name] and gave lab results.  Advised to have pt drink extra glass of water daily per DC.  Pt daughter asked if the lasix should be dropped to 20 mg daily from 30 mg.  Per Roderic Palau, NP pt labs have not changed significantly for the lasix to be changed since pt has tendency to volume overload.  Pt daughter advised and understood.

## 2016-04-18 DIAGNOSIS — H04123 Dry eye syndrome of bilateral lacrimal glands: Secondary | ICD-10-CM | POA: Diagnosis not present

## 2016-04-26 DIAGNOSIS — R300 Dysuria: Secondary | ICD-10-CM | POA: Diagnosis not present

## 2016-05-17 DIAGNOSIS — R300 Dysuria: Secondary | ICD-10-CM | POA: Diagnosis not present

## 2016-06-06 DIAGNOSIS — N3281 Overactive bladder: Secondary | ICD-10-CM | POA: Diagnosis not present

## 2016-06-06 DIAGNOSIS — I130 Hypertensive heart and chronic kidney disease with heart failure and stage 1 through stage 4 chronic kidney disease, or unspecified chronic kidney disease: Secondary | ICD-10-CM | POA: Diagnosis not present

## 2016-06-06 DIAGNOSIS — E559 Vitamin D deficiency, unspecified: Secondary | ICD-10-CM | POA: Diagnosis not present

## 2016-06-06 DIAGNOSIS — I517 Cardiomegaly: Secondary | ICD-10-CM | POA: Diagnosis not present

## 2016-06-06 DIAGNOSIS — E0590 Thyrotoxicosis, unspecified without thyrotoxic crisis or storm: Secondary | ICD-10-CM | POA: Diagnosis not present

## 2016-06-06 DIAGNOSIS — E1129 Type 2 diabetes mellitus with other diabetic kidney complication: Secondary | ICD-10-CM | POA: Diagnosis not present

## 2016-06-06 DIAGNOSIS — D692 Other nonthrombocytopenic purpura: Secondary | ICD-10-CM | POA: Diagnosis not present

## 2016-06-06 DIAGNOSIS — G309 Alzheimer's disease, unspecified: Secondary | ICD-10-CM | POA: Diagnosis not present

## 2016-06-06 DIAGNOSIS — Z6832 Body mass index (BMI) 32.0-32.9, adult: Secondary | ICD-10-CM | POA: Diagnosis not present

## 2016-07-18 ENCOUNTER — Encounter (HOSPITAL_COMMUNITY): Payer: Self-pay | Admitting: Nurse Practitioner

## 2016-07-18 ENCOUNTER — Ambulatory Visit (HOSPITAL_COMMUNITY)
Admission: RE | Admit: 2016-07-18 | Discharge: 2016-07-18 | Disposition: A | Discharge status: Home / Self Care | Payer: Commercial Managed Care - HMO | Source: Ambulatory Visit | Attending: Nurse Practitioner | Admitting: Nurse Practitioner

## 2016-07-18 VITALS — BP 158/94 | HR 67 | Ht 65.0 in | Wt 202.6 lb

## 2016-07-18 DIAGNOSIS — I130 Hypertensive heart and chronic kidney disease with heart failure and stage 1 through stage 4 chronic kidney disease, or unspecified chronic kidney disease: Secondary | ICD-10-CM | POA: Insufficient documentation

## 2016-07-18 DIAGNOSIS — I509 Heart failure, unspecified: Secondary | ICD-10-CM | POA: Diagnosis not present

## 2016-07-18 DIAGNOSIS — Z79899 Other long term (current) drug therapy: Secondary | ICD-10-CM | POA: Diagnosis not present

## 2016-07-18 DIAGNOSIS — I481 Persistent atrial fibrillation: Secondary | ICD-10-CM | POA: Diagnosis not present

## 2016-07-18 DIAGNOSIS — Z7982 Long term (current) use of aspirin: Secondary | ICD-10-CM | POA: Diagnosis not present

## 2016-07-18 DIAGNOSIS — E0590 Thyrotoxicosis, unspecified without thyrotoxic crisis or storm: Secondary | ICD-10-CM | POA: Diagnosis not present

## 2016-07-18 DIAGNOSIS — I480 Paroxysmal atrial fibrillation: Secondary | ICD-10-CM

## 2016-07-18 LAB — BASIC METABOLIC PANEL
Anion gap: 7 (ref 5–15)
BUN: 22 mg/dL — ABNORMAL HIGH (ref 6–20)
CALCIUM: 9.1 mg/dL (ref 8.9–10.3)
CHLORIDE: 110 mmol/L (ref 101–111)
CO2: 25 mmol/L (ref 22–32)
CREATININE: 1.29 mg/dL — AB (ref 0.44–1.00)
GFR calc non Af Amer: 37 mL/min — ABNORMAL LOW (ref >60)
GFR, EST AFRICAN AMERICAN: 43 mL/min — AB (ref >60)
Glucose, Bld: 184 mg/dL — ABNORMAL HIGH (ref 65–99)
Potassium: 3.9 mmol/L (ref 3.5–5.1)
SODIUM: 142 mmol/L (ref 135–145)

## 2016-07-18 NOTE — Progress Notes (Signed)
Patient ID: Anna [name], female   DOB: February 21, 1929, 82 y.o.   MRN: [id number]      Primary Care Physician: Haywood Pao, MD Referring Physician: Dr. Sandria Manly Anna [name] is a 82 y.o. female with a h/o PAF. Initially seen 9/16. She recently moved to [place] from [place] to be near her daughter. She has dementia and requires significant care. She has a h/o persistent afib for which she was previously managed with coumadin and amiodarone. She developed hyperthyroidism and her amiodarone was discontinued. She had difficulty with supratherapeutic INR due to confusions with her previous anticoagulation clinic in [place] and had a GI bleed in 2015. She has been off of coumadin and has been on only ASA since that time. She has had prior bradycardia and thus her beta blocker has been weaned over time. She has has some difficulty with SOB as well as BLE edema. Echo 01/28/15 revealed EF 55%, mild LVH, LA size 41 mm, no significant arrhythmias. She has now been placed on toprol XL 12.5 mg daily and is doing "very well" per pts daughter. The daughter has not noticed  any symptoms which would indicate any afib in the mother. She thinks she is asymptomatic when she does have afib.She does have occasional falls. The family wishes to keep her off anticoagulants.  Afib clinc for f/u 12/29. The daughter has not noticed any afib, but she has had some difficulties with upper respiratory symptoms over the last month. She took two rounds of antibiotics with symptoms of cough, shortness of breath with rest and exertion not improving. The daughter noticed that her abdomen appeared larger so on her own increased her mother's doe of lasix to 60 mg a day from 40 mg daily. Within a few days, her symptoms improved. She then went back to 40 mg on Saturday, but by Tuesday the symptoms returned, so she increased the lasix again and today, she is improved. The pt has an appointment with her PCP tomorrow and wants to  discuss if she can stay on 60 mg a day. She is in S brady which is her usual. NO PND/orthopnea. No pedal edema.  Returns to afib clinic 2/20, she had a hospitalization 1/4 for UTI. She also has been found to have hyperactive thyroid and has been started on appropriate meds for this. The pt thinks her mom is doing much better. She has have better energy. The daughter has cut back on her mom's BB to a quarter of a tablet, she does have baseline Sinus brady with LBBB, which is stable.  HR can increase to around 100 with walking but there has been no evidence of afib.  Clinic visit 5/22, per daughter and review of records thyroid has stabilized. She has some ongoing issues with fluid retention and daughters concern if she is over or under hydrated. Very hard to weight daily.She will adjust her lasix between 40-60 mg a day from her symptoms. Bmet done recently showed stable renal status with Creat cl around 1.6. She looks normovolemic in office today. She does not participate in the clinic conversation but is alert. She is in sinus brady today. No reason to think afib has been present.  Afib clinic visit 8/24, pt is in [place]. Overall pt is stable appearing. Daughter adjusts lasix as needed for fluid status. Duaghter speaks for the mother due to lack of communication secondary to  the alzheimers. Currently is stable on 30 mg a day but does have tendency toward  dehydration so will obtain bmet. Daughter states she frequently will have some mild congestion but lungs are clear and PO is 100 %. Daughter noted one day the the patients HR was in the 40's and she appeared sluggish.  F/u in the afib clinic 11/27. The history is by the  Daughter and she states that they traveled by car to [place] 2 weeks ago and the pt developed some chest congestion and had a mild congested cough. She increased the lasix and gave robitussin and the cough improved.  Pulse ox has stayed around mid 90's. SR on EKG today. The daughter has not  picked up on any elevation of HR or irregularity.  Today, daughter for the mother denies symptoms of palpitations, chest pain, shortness of breath, orthopnea, PND, lower extremity edema, dizziness, presyncope, syncope, or neurologic sequela. The patient is tolerating medications without difficulties and is otherwise without complaint today.   Past Medical History:  Diagnosis Date  . CHF (congestive heart failure) ([place])   . Chronic renal insufficiency, stage III (moderate)   . Dementia   . Diabetes mellitus without complication ([place])   . GI bleeding 11/2013   due to supratherapeutic INR  . Hyperlipidemia   . Hypertension   . Hyperthyroidism    on amiodarone  . LBBB (left bundle branch block)   . Overactive bladder   . Overweight   . Persistent atrial fibrillation ([place])   . Vitamin B12 deficiency   . Vitamin D deficiency    Past Surgical History:  Procedure Laterality Date  . ABDOMINAL HYSTERECTOMY      Current Outpatient Prescriptions  Medication Sig Dispense Refill  . acetaminophen (TYLENOL) 500 MG tablet Take 500 mg by mouth every 6 (six) hours as needed for fever.     Marland Kitchen aspirin 325 MG tablet Take 325 mg by mouth daily.    . calcitRIOL (ROCALTROL) 0.5 MCG capsule Take 0.5 mcg by mouth 3 (three) times a week. Monday, Wednesday, and Friday only    . carbamide peroxide (DEBROX) 6.5 % otic solution Place 5 drops into both ears 2 (two) times daily. 15 mL 0  . docusate sodium (COLACE) 100 MG capsule Take 100 mg by mouth daily as needed for mild constipation.    Marland Kitchen donepezil (ARICEPT) 10 MG tablet Take 10 mg by mouth at bedtime.    . fluticasone (FLONASE) 50 MCG/ACT nasal spray Place 2 sprays into both nostrils daily.    . furosemide (LASIX) 20 MG tablet Take 1.5 tablets by mouth as needed.     Marland Kitchen glimepiride (AMARYL) 2 MG tablet Take 2 mg by mouth daily with breakfast.    . guaiFENesin (MUCINEX) 600 MG 12 hr tablet Take 600 mg by mouth 2 (two) times daily as needed for cough. Reported  on 12/22/2015    . loratadine (ALAVERT) 10 MG tablet Take 10 mg by mouth. Every 2 days    . MYRBETRIQ 50 MG TB24 tablet Take 50 mg by mouth daily.     Vladimir Faster Glycol-Propyl Glycol (SYSTANE) 0.4-0.3 % SOLN Apply 1 drop to eye 2 (two) times daily as needed (dry eyes).    . potassium chloride SA (K-DUR,KLOR-CON) 20 MEQ tablet Take 10 mEq by mouth daily.    . pravastatin (PRAVACHOL) 40 MG tablet Take 40 mg by mouth daily.    . vitamin B-12 (CYANOCOBALAMIN) 500 MCG tablet Take 500 mcg by mouth daily.    . Vitamin D, Ergocalciferol, (DRISDOL) 50000 UNITS CAPS capsule Take 50,000 Units by mouth  once a week. Mondays    . ACCU-CHEK AVIVA PLUS test strip PATIENT TO CHECK SUGARS ONCE DAILY.  3   No current facility-administered medications for this encounter.     Allergies  Allergen Reactions  . Pollen Extract     Sneezing, watery eyes    Social History   Social History  . Marital status: Unknown    Spouse name: N/A  . Number of children: N/A  . Years of education: N/A   Occupational History  . Not on file.   Social History Main Topics  . Smoking status: Never Smoker  . Smokeless tobacco: Not on file  . Alcohol use No  . Drug use: No  . Sexual activity: Not on file   Other Topics Concern  . Not on file   Social History Narrative   Pt recently moved to [place] with daughter from [place]       Family History  Problem Relation Age of Onset  . Heart disease Father   . Diabetes Mother   . Thyroid disease Neg Hx     ROS- All systems are reviewed and negative except as per the HPI above  Physical Exam: Vitals:   07/18/16 1414  BP: (!) 158/94  Pulse: 67  Weight: 202 lb 9.6 oz (91.9 kg)  Height: 5\' 5"  (1.651 m)    GEN- The patient is well appearing, alert and oriented x 3 today.   Head- normocephalic, atraumatic Eyes-  Sclera clear, conjunctiva pink Ears- hearing intact Oropharynx- clear Neck- supple, no JVP Lymph- no cervical lymphadenopathy Lungs- Clear to  ausculation bilaterally, normal work of breathing Heart- Regular rate and rhythm, no murmurs, rubs or gallops, PMI not laterally displaced GI- soft, NT, ND, + BS Extremities- no clubbing, cyanosis, or edema MS- no significant deformity or atrophy Skin- no rash or lesion Psych- euthymic mood, full affect Neuro- strength and sensation are intact  EKG- sinus rhythm with first degree AVB with PVC's,  LBBB pr int 224 ms, qrs int 154 ms, qtc 481 ms Epic records reviewed Bmet pending   Assessment and Plan: 1. PAF Maintaining SR Off BB Continue asa per family's wishes, understanding that asa does not protect against stroke per guidelines, but GI bleed in the past on warfarin.  2.CHF Stable On lasix 30 mg daily bmet pending  3. Hyperthyroidism Improved with treatment  F/u with Endocrinologist  F/u with 6 months , sooner if needed  Butch Penny C. Rani Sisney, [facility] [address] [place], [place] [address] [phone]

## 2016-07-26 DIAGNOSIS — R300 Dysuria: Secondary | ICD-10-CM | POA: Diagnosis not present

## 2016-07-26 DIAGNOSIS — N390 Urinary tract infection, site not specified: Secondary | ICD-10-CM | POA: Diagnosis not present

## 2016-08-01 ENCOUNTER — Telehealth (HOSPITAL_COMMUNITY): Payer: Self-pay | Admitting: *Deleted

## 2016-08-01 NOTE — Telephone Encounter (Signed)
Pt daughter called in stating pt had a presyncopal episode yesterday where pt got very pale and was unable to speak but never LOC. She lowered her to the floor and put her in trendelenburg. HR was ranging from 48-118 BP 139/70. Pt laid in floor for a good deal of time before getting up and after moving around for few minutes her BP was 135/75. Pt daughter had held her lasix on and off over weekend b/c she felt she was dehydrated. Encouraged good PO intake for patient. Pt is back to baseline today. Instructed her to call if further occurrences of these episodes as with last hospitalization they recommended a monitor if episodes continued. Daughter verbalized understanding.

## 2016-08-14 ENCOUNTER — Telehealth: Payer: Self-pay | Admitting: Cardiology

## 2016-08-14 ENCOUNTER — Other Ambulatory Visit: Payer: Self-pay | Admitting: Cardiology

## 2016-08-14 MED ORDER — METOPROLOL SUCCINATE ER 25 MG PO TB24: 12.5000 mg | ORAL_TABLET | Freq: Every day | ORAL | 6 refills | Status: DC

## 2016-08-14 NOTE — Telephone Encounter (Signed)
Pt's daughter called and her mother is in a fib with HR up to 120, this is first episode episode in 2 years.  Just started this AM.  BP is 139/76 SP02 at 93%, she does have metoprolol at home and will take 12.5 mg now and another this evening if HR is still elevated.  Also her lasix has been decreased from 20 mg to 10 mg every other day.  She will take an extra 10 mg now and a one time dose of Kdur 10 meq.   If further problems will call.  I will ask for a fib clinic to see her next week if possible.  Sending in new script for metoprolol as well.

## 2016-08-16 DIAGNOSIS — N390 Urinary tract infection, site not specified: Secondary | ICD-10-CM | POA: Diagnosis not present

## 2016-08-16 DIAGNOSIS — R300 Dysuria: Secondary | ICD-10-CM | POA: Diagnosis not present

## 2016-08-19 ENCOUNTER — Telehealth (HOSPITAL_COMMUNITY): Payer: Self-pay | Admitting: *Deleted

## 2016-08-19 NOTE — Telephone Encounter (Signed)
Pt daughter called with update. One extra dose of metoprolol helped return to NSR. More concerned with lasix dosing. She feels the 20mg  of lasix dehydrates pt and would prefer to use 10mg  unless pt looks fluid overloaded.  Encouraged daughter she can use metoprolol prn if another episode of afib should arise as this worked well for pt. Will call if further issues.

## 2016-08-19 NOTE — Telephone Encounter (Signed)
-----   Message from Thompson Grayer, MD sent at 08/14/2016 11:08 AM EST ----- Please follow-up with patient   ----- Message ----- From: Isaiah Serge, NP Sent: 08/14/2016  10:36 AM To: Thompson Grayer, MD, Sherran Needs, NP  Butch Penny could pt be seen in a fib clinic the week of Christmas?  Thanks.

## 2016-08-23 DIAGNOSIS — R300 Dysuria: Secondary | ICD-10-CM | POA: Diagnosis not present

## 2016-08-26 ENCOUNTER — Telehealth (HOSPITAL_COMMUNITY): Payer: Self-pay | Admitting: *Deleted

## 2016-08-26 DIAGNOSIS — R05 Cough: Secondary | ICD-10-CM | POA: Diagnosis not present

## 2016-08-26 DIAGNOSIS — R300 Dysuria: Secondary | ICD-10-CM | POA: Diagnosis not present

## 2016-08-26 DIAGNOSIS — D696 Thrombocytopenia, unspecified: Secondary | ICD-10-CM | POA: Diagnosis not present

## 2016-08-26 DIAGNOSIS — R5381 Other malaise: Secondary | ICD-10-CM | POA: Diagnosis not present

## 2016-08-26 DIAGNOSIS — I10 Essential (primary) hypertension: Secondary | ICD-10-CM | POA: Diagnosis not present

## 2016-08-26 DIAGNOSIS — I5020 Unspecified systolic (congestive) heart failure: Secondary | ICD-10-CM | POA: Diagnosis not present

## 2016-08-26 DIAGNOSIS — I480 Paroxysmal atrial fibrillation: Secondary | ICD-10-CM | POA: Diagnosis not present

## 2016-08-26 DIAGNOSIS — J309 Allergic rhinitis, unspecified: Secondary | ICD-10-CM | POA: Diagnosis not present

## 2016-08-26 NOTE — Telephone Encounter (Signed)
Patient's daughter called in stating her mother's blood pressure has began to be elevated consistently and is concerned she should be started on BP medication. HR is stable. Daughter also endorses pt is sounding congested and sleeping more but no fever. Instructed daughter to call PCP office to be assessed as PCP can manage BP alone and follow pt closely. Daughter verbalized understanding.

## 2016-09-03 ENCOUNTER — Telehealth: Payer: Self-pay | Admitting: Physician Assistant

## 2016-09-03 NOTE — Telephone Encounter (Signed)
83 yo with parox AF.  She is not on coumadin 2/2 hx of GI bleed.  She is followed in the AF clinic. Her daughter, Con Memos called b/c Ms. Askeland was dx with pneumonia earlier this week and started on antibiotics. She was doing ok until 2 days ago.  Her daughter notes that she was feeling poorly and more lethargic. She has also been short of breath. Today, by her pulse ox check, she is back in AF with HR 80-120.  She took Metoprolol 12.5 x 1 this AM. Her O2 sats are a little lower (93%). Given her age and recent dx of pneumonia, dyspnea and now recurrent AF, I have recommended she go to the ED for evaluation. Her daughter agreed with this plan Richardson Dopp, PA-C   09/03/2016 9:21 AM

## 2016-09-12 DIAGNOSIS — R300 Dysuria: Secondary | ICD-10-CM | POA: Diagnosis not present

## 2016-09-12 DIAGNOSIS — I5020 Unspecified systolic (congestive) heart failure: Secondary | ICD-10-CM | POA: Diagnosis not present

## 2016-09-12 DIAGNOSIS — J181 Lobar pneumonia, unspecified organism: Secondary | ICD-10-CM | POA: Diagnosis not present

## 2016-09-12 DIAGNOSIS — R05 Cough: Secondary | ICD-10-CM | POA: Diagnosis not present

## 2016-09-13 ENCOUNTER — Telehealth (HOSPITAL_COMMUNITY): Payer: Self-pay | Admitting: *Deleted

## 2016-09-13 NOTE — Telephone Encounter (Signed)
Patient's daughter, Coralyn Mark, called in stating her mother has been in afib since last evening. She has taken a total of 25mg  of toprol since 1am (last quarter of medication was at 630am). HR is ranging from 80-120s with BP 130/80s. Re-iterated that pneumonia can exacerbate atrial fibrillation if her HR is still up this afternoon I would repeat metoprolol this evening. Can take metoprolol daily if afib continues and HR is staying at or above 100. Reviewed reasons she should take pt to the ER. Pt had follow up CXR yesterday at PCP office but has not received results -- encouraged daughter to follow up with their office to ensure pneumonia has cleared. Daughter will call back if further issues or concerns.

## 2016-09-14 ENCOUNTER — Observation Stay (HOSPITAL_COMMUNITY)
Admission: EM | Admit: 2016-09-14 | Discharge: 2016-09-17 | Disposition: A | Discharge status: Home / Self Care | Payer: Medicare HMO | Attending: Internal Medicine | Admitting: Internal Medicine

## 2016-09-14 ENCOUNTER — Emergency Department (HOSPITAL_COMMUNITY): Payer: Medicare HMO

## 2016-09-14 ENCOUNTER — Telehealth: Payer: Self-pay | Admitting: Physician Assistant

## 2016-09-14 ENCOUNTER — Telehealth (HOSPITAL_COMMUNITY): Payer: Self-pay | Admitting: *Deleted

## 2016-09-14 DIAGNOSIS — I130 Hypertensive heart and chronic kidney disease with heart failure and stage 1 through stage 4 chronic kidney disease, or unspecified chronic kidney disease: Secondary | ICD-10-CM | POA: Diagnosis not present

## 2016-09-14 DIAGNOSIS — I4891 Unspecified atrial fibrillation: Secondary | ICD-10-CM | POA: Diagnosis present

## 2016-09-14 DIAGNOSIS — R1310 Dysphagia, unspecified: Secondary | ICD-10-CM | POA: Diagnosis not present

## 2016-09-14 DIAGNOSIS — E785 Hyperlipidemia, unspecified: Secondary | ICD-10-CM | POA: Diagnosis not present

## 2016-09-14 DIAGNOSIS — Z7982 Long term (current) use of aspirin: Secondary | ICD-10-CM | POA: Diagnosis not present

## 2016-09-14 DIAGNOSIS — R05 Cough: Secondary | ICD-10-CM

## 2016-09-14 DIAGNOSIS — R471 Dysarthria and anarthria: Secondary | ICD-10-CM

## 2016-09-14 DIAGNOSIS — D696 Thrombocytopenia, unspecified: Secondary | ICD-10-CM | POA: Insufficient documentation

## 2016-09-14 DIAGNOSIS — E118 Type 2 diabetes mellitus with unspecified complications: Secondary | ICD-10-CM | POA: Diagnosis present

## 2016-09-14 DIAGNOSIS — D509 Iron deficiency anemia, unspecified: Secondary | ICD-10-CM | POA: Diagnosis not present

## 2016-09-14 DIAGNOSIS — Z7984 Long term (current) use of oral hypoglycemic drugs: Secondary | ICD-10-CM | POA: Diagnosis not present

## 2016-09-14 DIAGNOSIS — R059 Cough, unspecified: Secondary | ICD-10-CM

## 2016-09-14 DIAGNOSIS — N183 Chronic kidney disease, stage 3 unspecified: Secondary | ICD-10-CM | POA: Diagnosis present

## 2016-09-14 DIAGNOSIS — I481 Persistent atrial fibrillation: Principal | ICD-10-CM | POA: Insufficient documentation

## 2016-09-14 DIAGNOSIS — E1122 Type 2 diabetes mellitus with diabetic chronic kidney disease: Secondary | ICD-10-CM | POA: Insufficient documentation

## 2016-09-14 DIAGNOSIS — E663 Overweight: Secondary | ICD-10-CM | POA: Insufficient documentation

## 2016-09-14 DIAGNOSIS — I5041 Acute combined systolic (congestive) and diastolic (congestive) heart failure: Secondary | ICD-10-CM | POA: Diagnosis present

## 2016-09-14 DIAGNOSIS — Z6834 Body mass index (BMI) 34.0-34.9, adult: Secondary | ICD-10-CM | POA: Insufficient documentation

## 2016-09-14 DIAGNOSIS — I5043 Acute on chronic combined systolic (congestive) and diastolic (congestive) heart failure: Secondary | ICD-10-CM | POA: Diagnosis not present

## 2016-09-14 DIAGNOSIS — E538 Deficiency of other specified B group vitamins: Secondary | ICD-10-CM | POA: Diagnosis not present

## 2016-09-14 DIAGNOSIS — F0390 Unspecified dementia without behavioral disturbance: Secondary | ICD-10-CM | POA: Insufficient documentation

## 2016-09-14 DIAGNOSIS — E1129 Type 2 diabetes mellitus with other diabetic kidney complication: Secondary | ICD-10-CM | POA: Diagnosis present

## 2016-09-14 DIAGNOSIS — R5381 Other malaise: Secondary | ICD-10-CM | POA: Insufficient documentation

## 2016-09-14 DIAGNOSIS — I110 Hypertensive heart disease with heart failure: Secondary | ICD-10-CM | POA: Diagnosis not present

## 2016-09-14 DIAGNOSIS — I480 Paroxysmal atrial fibrillation: Secondary | ICD-10-CM | POA: Insufficient documentation

## 2016-09-14 DIAGNOSIS — I5033 Acute on chronic diastolic (congestive) heart failure: Secondary | ICD-10-CM

## 2016-09-14 DIAGNOSIS — E0590 Thyrotoxicosis, unspecified without thyrotoxic crisis or storm: Secondary | ICD-10-CM | POA: Diagnosis present

## 2016-09-14 DIAGNOSIS — Z79899 Other long term (current) drug therapy: Secondary | ICD-10-CM | POA: Diagnosis not present

## 2016-09-14 DIAGNOSIS — F4489 Other dissociative and conversion disorders: Secondary | ICD-10-CM | POA: Diagnosis not present

## 2016-09-14 DIAGNOSIS — I6789 Other cerebrovascular disease: Secondary | ICD-10-CM | POA: Diagnosis not present

## 2016-09-14 LAB — BASIC METABOLIC PANEL
ANION GAP: 7 (ref 5–15)
BUN: 22 mg/dL — ABNORMAL HIGH (ref 6–20)
CALCIUM: 9.1 mg/dL (ref 8.9–10.3)
CO2: 21 mmol/L — ABNORMAL LOW (ref 22–32)
Chloride: 109 mmol/L (ref 101–111)
Creatinine, Ser: 1.22 mg/dL — ABNORMAL HIGH (ref 0.44–1.00)
GFR, EST AFRICAN AMERICAN: 45 mL/min — AB (ref >60)
GFR, EST NON AFRICAN AMERICAN: 39 mL/min — AB (ref >60)
GLUCOSE: 174 mg/dL — AB (ref 65–99)
Potassium: 4.6 mmol/L (ref 3.5–5.1)
SODIUM: 137 mmol/L (ref 135–145)

## 2016-09-14 LAB — CBC WITH DIFFERENTIAL/PLATELET
BASOS ABS: 0.1 10*3/uL (ref 0.0–0.1)
Basophils Relative: 1 %
EOS ABS: 0.1 10*3/uL (ref 0.0–0.7)
Eosinophils Relative: 2 %
HCT: 35 % — ABNORMAL LOW (ref 36.0–46.0)
Hemoglobin: 10.4 g/dL — ABNORMAL LOW (ref 12.0–15.0)
LYMPHS PCT: 39 %
Lymphs Abs: 2.4 10*3/uL (ref 0.7–4.0)
MCH: 23.1 pg — ABNORMAL LOW (ref 26.0–34.0)
MCHC: 29.7 g/dL — ABNORMAL LOW (ref 30.0–36.0)
MCV: 77.8 fL — AB (ref 78.0–100.0)
MONOS PCT: 7 %
Monocytes Absolute: 0.4 10*3/uL (ref 0.1–1.0)
NEUTROS ABS: 3.2 10*3/uL (ref 1.7–7.7)
NEUTROS PCT: 51 %
PLATELETS: 75 10*3/uL — AB (ref 150–400)
RBC: 4.5 MIL/uL (ref 3.87–5.11)
RDW: 16.6 % — AB (ref 11.5–15.5)
WBC: 6.2 10*3/uL (ref 4.0–10.5)

## 2016-09-14 LAB — I-STAT TROPONIN, ED: TROPONIN I, POC: 0.01 ng/mL (ref 0.00–0.08)

## 2016-09-14 LAB — BRAIN NATRIURETIC PEPTIDE: B NATRIURETIC PEPTIDE 5: 188.2 pg/mL — AB (ref 0.0–100.0)

## 2016-09-14 MED ORDER — METOPROLOL TARTRATE 25 MG PO TABS
12.5000 mg | ORAL_TABLET | Freq: Once | ORAL | Status: AC
  Administered 2016-09-15: 12.5 mg via ORAL
  Filled 2016-09-14: qty 1

## 2016-09-14 MED ORDER — SODIUM CHLORIDE 0.9 % IV BOLUS (SEPSIS)
500.0000 mL | Freq: Once | INTRAVENOUS | Status: AC
  Administered 2016-09-14: 500 mL via INTRAVENOUS

## 2016-09-14 MED ORDER — SODIUM CHLORIDE 0.9 % IV BOLUS (SEPSIS): 1000.0000 mL | Freq: Once | INTRAVENOUS | Status: DC

## 2016-09-14 NOTE — ED Triage Notes (Signed)
Pt was sent in today after having uncontrolled a-fib and hypertension. Family stopped giving her lasix 2 days ago because they thought she was dehydrated because she hasn't been drinking much. Alert to self at baseline.

## 2016-09-14 NOTE — ED Notes (Signed)
Bed: [id number] Expected date:  Expected time:  Means of arrival:  Comments: 83 yo F  Possible dehydration, a fib

## 2016-09-14 NOTE — Telephone Encounter (Signed)
Talked with daughter who is very anxious with her mother being in afib. Tried to reassure again that if her HR is controlled she does not need to take the prn metoprolol. Worried because her blood pressure is 125/99 but pt has not taken lasix in 2 days due to low PO intake. Encouraged daughter to push fluids. Watch HR if consistently over 100bpm take metoprolol if not just continue to monitor patient. Daughter very weary of any instructions given and does not understand why her mother is still having afib if her pneumonia has cleared via cxr report. Reassured it often takes time for the body to recoup from infection and may take time for afib to resolve. Pt on aspirin only due to risk of falls.

## 2016-09-14 NOTE — Telephone Encounter (Signed)
   Patient's daughter terry called about persistent afib in mom, Anna [name]. HR mostly under 100 but up over 100 with activity. Not sustaining over >100. Not feeling great. Worried because HR fluctuating. Provided reassurance and asked her to call afib clinic when office opens.   Angelena Form PA-C  [facility]

## 2016-09-14 NOTE — ED Notes (Signed)
ED Provider at bedside. 

## 2016-09-15 ENCOUNTER — Other Ambulatory Visit (HOSPITAL_COMMUNITY): Payer: Commercial Managed Care - HMO

## 2016-09-15 ENCOUNTER — Telehealth (HOSPITAL_COMMUNITY): Payer: Self-pay | Admitting: *Deleted

## 2016-09-15 ENCOUNTER — Telehealth: Payer: Self-pay | Admitting: Internal Medicine

## 2016-09-15 ENCOUNTER — Encounter (HOSPITAL_COMMUNITY): Payer: Self-pay

## 2016-09-15 DIAGNOSIS — I5041 Acute combined systolic (congestive) and diastolic (congestive) heart failure: Secondary | ICD-10-CM

## 2016-09-15 DIAGNOSIS — I482 Chronic atrial fibrillation: Secondary | ICD-10-CM | POA: Diagnosis not present

## 2016-09-15 DIAGNOSIS — I4891 Unspecified atrial fibrillation: Secondary | ICD-10-CM | POA: Diagnosis not present

## 2016-09-15 DIAGNOSIS — E663 Overweight: Secondary | ICD-10-CM | POA: Diagnosis not present

## 2016-09-15 DIAGNOSIS — E1122 Type 2 diabetes mellitus with diabetic chronic kidney disease: Secondary | ICD-10-CM | POA: Diagnosis not present

## 2016-09-15 DIAGNOSIS — F0390 Unspecified dementia without behavioral disturbance: Secondary | ICD-10-CM | POA: Diagnosis not present

## 2016-09-15 DIAGNOSIS — E118 Type 2 diabetes mellitus with unspecified complications: Secondary | ICD-10-CM

## 2016-09-15 DIAGNOSIS — I481 Persistent atrial fibrillation: Secondary | ICD-10-CM

## 2016-09-15 DIAGNOSIS — I130 Hypertensive heart and chronic kidney disease with heart failure and stage 1 through stage 4 chronic kidney disease, or unspecified chronic kidney disease: Secondary | ICD-10-CM | POA: Diagnosis not present

## 2016-09-15 DIAGNOSIS — N183 Chronic kidney disease, stage 3 (moderate): Secondary | ICD-10-CM

## 2016-09-15 DIAGNOSIS — E0590 Thyrotoxicosis, unspecified without thyrotoxic crisis or storm: Secondary | ICD-10-CM | POA: Diagnosis not present

## 2016-09-15 DIAGNOSIS — I10 Essential (primary) hypertension: Secondary | ICD-10-CM | POA: Diagnosis not present

## 2016-09-15 DIAGNOSIS — I480 Paroxysmal atrial fibrillation: Secondary | ICD-10-CM | POA: Diagnosis not present

## 2016-09-15 DIAGNOSIS — I5043 Acute on chronic combined systolic (congestive) and diastolic (congestive) heart failure: Secondary | ICD-10-CM | POA: Diagnosis not present

## 2016-09-15 DIAGNOSIS — E785 Hyperlipidemia, unspecified: Secondary | ICD-10-CM | POA: Diagnosis not present

## 2016-09-15 DIAGNOSIS — R69 Illness, unspecified: Secondary | ICD-10-CM | POA: Diagnosis not present

## 2016-09-15 LAB — GLUCOSE, CAPILLARY
Glucose-Capillary: 131 mg/dL — ABNORMAL HIGH (ref 65–99)
Glucose-Capillary: 174 mg/dL — ABNORMAL HIGH (ref 65–99)

## 2016-09-15 LAB — URINALYSIS, ROUTINE W REFLEX MICROSCOPIC
BILIRUBIN URINE: NEGATIVE
Glucose, UA: NEGATIVE mg/dL
Hgb urine dipstick: NEGATIVE
KETONES UR: NEGATIVE mg/dL
Leukocytes, UA: NEGATIVE
NITRITE: NEGATIVE
Protein, ur: NEGATIVE mg/dL
Specific Gravity, Urine: 1.006 (ref 1.005–1.030)
pH: 5 (ref 5.0–8.0)

## 2016-09-15 LAB — MAGNESIUM: MAGNESIUM: 2 mg/dL (ref 1.7–2.4)

## 2016-09-15 LAB — PATHOLOGIST SMEAR REVIEW

## 2016-09-15 LAB — LACTATE DEHYDROGENASE: LDH: 142 U/L (ref 98–192)

## 2016-09-15 LAB — TSH: TSH: 0.738 u[IU]/mL (ref 0.350–4.500)

## 2016-09-15 MED ORDER — METOPROLOL TARTRATE 12.5 MG HALF TABLET: 12.5000 mg | ORAL_TABLET | Freq: Once | ORAL | Status: DC

## 2016-09-15 MED ORDER — ONDANSETRON HCL 4 MG/2ML IJ SOLN: 4.0000 mg | Freq: Four times a day (QID) | INTRAMUSCULAR | Status: DC | PRN

## 2016-09-15 MED ORDER — METOPROLOL TARTRATE 25 MG PO TABS: 12.5000 mg | ORAL_TABLET | Freq: Two times a day (BID) | ORAL | 0 refills | Status: DC | PRN

## 2016-09-15 MED ORDER — DONEPEZIL HCL 5 MG PO TABS
10.0000 mg | ORAL_TABLET | Freq: Every day | ORAL | Status: DC
  Administered 2016-09-15 – 2016-09-16 (×2): 10 mg via ORAL
  Filled 2016-09-15 (×3): qty 2

## 2016-09-15 MED ORDER — POLYVINYL ALCOHOL 1.4 % OP SOLN: 1.0000 [drp] | Freq: Two times a day (BID) | OPHTHALMIC | Status: DC | PRN

## 2016-09-15 MED ORDER — CHLORHEXIDINE GLUCONATE 0.12 % MT SOLN
15.0000 mL | Freq: Two times a day (BID) | OROMUCOSAL | Status: DC
  Administered 2016-09-15 – 2016-09-17 (×4): 15 mL via OROMUCOSAL
  Filled 2016-09-15 (×3): qty 15

## 2016-09-15 MED ORDER — CARBAMIDE PEROXIDE 6.5 % OT SOLN: 5.0000 [drp] | Freq: Every day | OTIC | Status: DC | PRN

## 2016-09-15 MED ORDER — GUAIFENESIN ER 600 MG PO TB12: 600.0000 mg | ORAL_TABLET | Freq: Two times a day (BID) | ORAL | Status: DC | PRN

## 2016-09-15 MED ORDER — FUROSEMIDE 10 MG/ML IJ SOLN
20.0000 mg | Freq: Every day | INTRAMUSCULAR | Status: DC
  Administered 2016-09-15: 20 mg via INTRAVENOUS
  Filled 2016-09-15: qty 2

## 2016-09-15 MED ORDER — LORATADINE 10 MG PO TABS: 10.0000 mg | ORAL_TABLET | Freq: Every day | ORAL | Status: DC | PRN

## 2016-09-15 MED ORDER — FUROSEMIDE 20 MG PO TABS
10.0000 mg | ORAL_TABLET | Freq: Every day | ORAL | Status: DC
  Administered 2016-09-15 – 2016-09-17 (×3): 10 mg via ORAL
  Filled 2016-09-15 (×3): qty 1

## 2016-09-15 MED ORDER — METOPROLOL TARTRATE 25 MG PO TABS
25.0000 mg | ORAL_TABLET | Freq: Two times a day (BID) | ORAL | Status: DC
  Administered 2016-09-15 – 2016-09-17 (×4): 25 mg via ORAL
  Filled 2016-09-15 (×4): qty 1

## 2016-09-15 MED ORDER — METOPROLOL SUCCINATE 12.5 MG HALF TABLET
12.5000 mg | ORAL_TABLET | Freq: Every day | ORAL | Status: DC
  Administered 2016-09-15: 12.5 mg via ORAL
  Filled 2016-09-15: qty 1

## 2016-09-15 MED ORDER — INSULIN ASPART 100 UNIT/ML ~~LOC~~ SOLN: 0.0000 [IU] | Freq: Every day | SUBCUTANEOUS | Status: DC

## 2016-09-15 MED ORDER — CYANOCOBALAMIN 500 MCG PO TABS
500.0000 ug | ORAL_TABLET | Freq: Every day | ORAL | Status: DC
  Administered 2016-09-15 – 2016-09-17 (×3): 500 ug via ORAL
  Filled 2016-09-15 (×3): qty 1

## 2016-09-15 MED ORDER — ONDANSETRON HCL 4 MG/2ML IJ SOLN: 4.0000 mg | Freq: Three times a day (TID) | INTRAMUSCULAR | Status: DC | PRN

## 2016-09-15 MED ORDER — METOPROLOL TARTRATE 12.5 MG HALF TABLET: 12.5000 mg | ORAL_TABLET | Freq: Two times a day (BID) | ORAL | Status: DC

## 2016-09-15 MED ORDER — DOCUSATE SODIUM 100 MG PO CAPS: 100.0000 mg | ORAL_CAPSULE | Freq: Every day | ORAL | Status: DC | PRN

## 2016-09-15 MED ORDER — PRAVASTATIN SODIUM 40 MG PO TABS
40.0000 mg | ORAL_TABLET | Freq: Every day | ORAL | Status: DC
  Administered 2016-09-15 – 2016-09-16 (×2): 40 mg via ORAL
  Filled 2016-09-15 (×3): qty 1

## 2016-09-15 MED ORDER — CALCITRIOL 0.5 MCG PO CAPS
0.5000 ug | ORAL_CAPSULE | ORAL | Status: DC
  Administered 2016-09-16: 0.5 ug via ORAL
  Filled 2016-09-15: qty 1

## 2016-09-15 MED ORDER — INSULIN ASPART 100 UNIT/ML ~~LOC~~ SOLN: 0.0000 [IU] | Freq: Three times a day (TID) | SUBCUTANEOUS | Status: DC

## 2016-09-15 MED ORDER — FLUTICASONE PROPIONATE 50 MCG/ACT NA SUSP
2.0000 | Freq: Every day | NASAL | Status: DC
  Administered 2016-09-15 – 2016-09-17 (×3): 2 via NASAL
  Filled 2016-09-15: qty 16

## 2016-09-15 MED ORDER — METOPROLOL TARTRATE 25 MG PO TABS
25.0000 mg | ORAL_TABLET | Freq: Once | ORAL | Status: AC
  Administered 2016-09-15: 25 mg via ORAL
  Filled 2016-09-15: qty 1

## 2016-09-15 MED ORDER — INSULIN ASPART 100 UNIT/ML ~~LOC~~ SOLN
0.0000 [IU] | Freq: Three times a day (TID) | SUBCUTANEOUS | Status: DC
  Administered 2016-09-15: 2 [IU] via SUBCUTANEOUS
  Administered 2016-09-15 – 2016-09-16 (×4): 1 [IU] via SUBCUTANEOUS
  Administered 2016-09-16 – 2016-09-17 (×2): 2 [IU] via SUBCUTANEOUS
  Administered 2016-09-17: 1 [IU] via SUBCUTANEOUS

## 2016-09-15 MED ORDER — ENOXAPARIN SODIUM 40 MG/0.4ML ~~LOC~~ SOLN
40.0000 mg | Freq: Every day | SUBCUTANEOUS | Status: DC
  Administered 2016-09-15 – 2016-09-17 (×3): 40 mg via SUBCUTANEOUS
  Filled 2016-09-15 (×3): qty 0.4

## 2016-09-15 MED ORDER — ASPIRIN 325 MG PO TABS
325.0000 mg | ORAL_TABLET | Freq: Every day | ORAL | Status: DC
Start: 2016-09-15 — End: 2016-09-17
  Administered 2016-09-15 – 2016-09-17 (×3): 325 mg via ORAL
  Filled 2016-09-15 (×3): qty 1

## 2016-09-15 MED ORDER — ACETAMINOPHEN 500 MG PO TABS
500.0000 mg | ORAL_TABLET | Freq: Four times a day (QID) | ORAL | Status: DC | PRN
  Administered 2016-09-16 – 2016-09-17 (×2): 500 mg via ORAL
  Filled 2016-09-15 (×3): qty 1

## 2016-09-15 NOTE — Progress Notes (Signed)
PHARMACY NOTE -  Enoxaparin   Pharmacy has been assisting with dosing of Lovenox for VTE ppx.  Dosage remains stable at 40 mg SQ q24 hr and need for further dosage adjustment appears unlikely at present.    Will sign off at this time but will follow peripherally for low platelets  Please reconsult if a change in clinical status warrants re-evaluation of dosage.  Reuel Boom, PharmD, BCPS Pager: [phone] 09/15/2016, 10:35 AM

## 2016-09-15 NOTE — ED Provider Notes (Signed)
[place] [facility] DEPT Provider Note   CSN: [id number] Arrival date & time: 09/14/16  2027     History   Chief Complaint Chief Complaint  Patient presents with  . Atrial Fibrillation    HPI Anna [name] is a 83 y.o. female.  The history is provided by a relative.  Weakness  Primary symptoms comment: global weakness, inability to ambulate. This is a new problem. The current episode started more than 2 days ago. The problem has not changed since onset.There was no focality noted. There has been no fever. Associated symptoms include shortness of breath (with cough and congestion). Associated medical issues comments: atrial fibrillation.    Past Medical History:  Diagnosis Date  . CHF (congestive heart failure) ([place])   . Chronic renal insufficiency, stage III (moderate)   . Dementia   . Diabetes mellitus without complication ([place])   . GI bleeding 11/2013   due to supratherapeutic INR  . Hyperlipidemia   . Hypertension   . Hyperthyroidism    on amiodarone  . LBBB (left bundle branch block)   . Overactive bladder   . Overweight   . Persistent atrial fibrillation ([place])   . Vitamin B12 deficiency   . Vitamin D deficiency     Patient Active Problem List   Diagnosis Date Noted  . Near syncope 02/05/2016  . Pre-syncope 02/05/2016  . Chest pain   . Diabetes mellitus with complication ([place])   . Hypokalemia 08/28/2015  . UTI (lower urinary tract infection) 08/26/2015  . Generalized weakness 08/26/2015  . Acute combined systolic and diastolic heart failure ([place]) 02/16/2015  . Hyperthyroidism 01/28/2015  . Dyspnea 01/27/2015  . SOB (shortness of breath) 01/27/2015  . Failure to thrive in adult 01/27/2015  . Persistent atrial fibrillation ([place]) 01/27/2015  . Benign essential HTN 01/27/2015  . DM (diabetes mellitus) type II controlled with renal manifestation ([place]) 01/27/2015  . CKD (chronic kidney disease), stage III 01/27/2015    Past Surgical History:  Procedure  Laterality Date  . ABDOMINAL HYSTERECTOMY      OB History    No data available       Home Medications    Prior to Admission medications   Medication Sig Start Date End Date Taking? Authorizing Provider  acetaminophen (TYLENOL) 500 MG tablet Take 500 mg by mouth every 6 (six) hours as needed for fever.    Yes Historical Provider, MD  aspirin 325 MG tablet Take 325 mg by mouth daily.   Yes Historical Provider, MD  calcitRIOL (ROCALTROL) 0.5 MCG capsule Take 0.5 mcg by mouth 3 (three) times a week. Monday, Wednesday, and Friday only 01/07/15  Yes Historical Provider, MD  carbamide peroxide (DEBROX) 6.5 % otic solution Place 5 drops into both ears 2 (two) times daily. Patient taking differently: Place 5 drops into both ears daily as needed.  02/09/16  Yes Geradine Girt, DO  docusate sodium (COLACE) 100 MG capsule Take 100 mg by mouth daily as needed for mild constipation.   Yes Historical Provider, MD  donepezil (ARICEPT) 10 MG tablet Take 10 mg by mouth at bedtime.   Yes Historical Provider, MD  fluticasone (FLONASE) 50 MCG/ACT nasal spray Place 2 sprays into both nostrils daily.   Yes Historical Provider, MD  furosemide (LASIX) 20 MG tablet Take 1.5 tablets by mouth as needed.  01/19/16  Yes Historical Provider, MD  furosemide (LASIX) 20 MG tablet Take 10 mg by mouth daily.   Yes Historical Provider, MD  glimepiride (AMARYL) 2 MG  tablet Take 2 mg by mouth daily with breakfast.   Yes Historical Provider, MD  guaiFENesin (MUCINEX) 600 MG 12 hr tablet Take 600 mg by mouth 2 (two) times daily as needed for cough. Reported on 12/22/2015   Yes Historical Provider, MD  loratadine (ALAVERT) 10 MG tablet Take 10 mg by mouth daily as needed for allergies. Every 2 days    Yes Historical Provider, MD  Polyethyl Glycol-Propyl Glycol (SYSTANE) 0.4-0.3 % SOLN Apply 1 drop to eye 2 (two) times daily as needed (dry eyes).   Yes Historical Provider, MD  pravastatin (PRAVACHOL) 40 MG tablet Take 40 mg by mouth  daily.   Yes Historical Provider, MD  vitamin B-12 (CYANOCOBALAMIN) 500 MCG tablet Take 500 mcg by mouth daily.   Yes Historical Provider, MD  Vitamin D, Ergocalciferol, (DRISDOL) 50000 UNITS CAPS capsule Take 50,000 Units by mouth once a week. Mondays 01/07/15  Yes Historical Provider, MD  [place] test strip PATIENT TO CHECK SUGARS ONCE DAILY. 11/19/15   Historical Provider, MD  metoprolol succinate (TOPROL XL) 25 MG 24 hr tablet Take 0.5 tablets (12.5 mg total) by mouth daily. May take a second time each day for rapid HR.  If HR is slow ok to hold Patient taking differently: Take 12.5 mg by mouth daily as needed (rapid HR). May take a second time each day for rapid HR.  If HR is slow ok to hold 08/14/16   Isaiah Serge, NP    Family History Family History  Problem Relation Age of Onset  . Heart disease Father   . Diabetes Mother   . Thyroid disease Neg Hx     Social History Social History  Substance Use Topics  . Smoking status: Never Smoker  . Smokeless tobacco: Not on file  . Alcohol use No     Allergies   Pollen extract   Review of Systems Review of Systems  Respiratory: Positive for shortness of breath (with cough and congestion).   Neurological: Positive for weakness.  All other systems reviewed and are negative.    Physical Exam Updated Vital Signs BP 133/86   Pulse 91   Temp 97.8 F (36.6 C) (Oral)   Resp 23   SpO2 97%   Physical Exam  Constitutional: She is oriented to person, place, and time. She appears well-developed and well-nourished. No distress.  HENT:  Head: Normocephalic.  Nose: Nose normal.  Eyes: Conjunctivae are normal.  Neck: Neck supple. No tracheal deviation present.  Cardiovascular: An irregularly irregular rhythm present. Tachycardia present.   Pulmonary/Chest: Effort normal. No respiratory distress. She has rales (diffuse).  Abdominal: Soft. She exhibits no distension.  Neurological: She is alert and oriented to person,  place, and time.  Skin: Skin is warm and dry.  Psychiatric: She has a normal mood and affect.     ED Treatments / Results  Labs (all labs ordered are listed, but only abnormal results are displayed) Labs Reviewed  CBC WITH DIFFERENTIAL/PLATELET - Abnormal; Notable for the following:       Result Value   Hemoglobin 10.4 (*)    HCT 35.0 (*)    MCV 77.8 (*)    MCH 23.1 (*)    MCHC 29.7 (*)    RDW 16.6 (*)    Platelets 75 (*)    All other components within normal limits  BASIC METABOLIC PANEL - Abnormal; Notable for the following:    CO2 21 (*)    Glucose, Bld 174 (*)  BUN 22 (*)    Creatinine, Ser 1.22 (*)    GFR calc non Af Amer 39 (*)    GFR calc Af Amer 45 (*)    All other components within normal limits  BRAIN NATRIURETIC PEPTIDE - Abnormal; Notable for the following:    B Natriuretic Peptide 188.2 (*)    All other components within normal limits  I-STAT TROPOININ, ED    EKG  EKG Interpretation  Date/Time:  Wednesday September 14 2016 20:41:23 EST Ventricular Rate:  107 PR Interval:    QRS Duration: 149 QT Interval:  398 QTC Calculation: 531 R Axis:   -24 Text Interpretation:  Atrial fibrillation Ventricular premature complex Left bundle branch block Since last tracing rate faster Otherwise no significant change Confirmed by Kanylah Muench MD, Quillian Quince [id number]) on 09/14/2016 10:46:01 PM       Radiology Dg Chest 2 View  Result Date: 09/15/2016 CLINICAL DATA:  Initial evaluation for with a acute cough, AFib with hypertension. EXAM: CHEST  2 VIEW COMPARISON:  None. FINDINGS: Cardiomegaly.  Mediastinal silhouette within normal limits. Lungs hypoinflated. Diffuse vascular congestion with interstitial prominence, suspected to reflect pulmonary interstitial edema. Superimposed streaky left basilar opacities favored to reflect atelectasis. No other focal infiltrates identified. No pleural effusion. No pneumothorax. No acute osseous abnormality. IMPRESSION: 1. Cardiomegaly with diffuse  pulmonary vascular congestion and interstitial prominence, suggesting mild pulmonary interstitial edema. 2. Superimposed patchy and linear left basilar opacity, favored to reflect atelectasis. Electronically Signed   By: Jeannine Boga M.D.   On: 09/15/2016 00:03    Procedures Procedures (including critical care time)  Medications Ordered in ED Medications  metoprolol tartrate (LOPRESSOR) tablet 12.5 mg (not administered)  metoprolol succinate (TOPROL-XL) 24 hr tablet 12.5 mg (not administered)  loratadine (CLARITIN) tablet 10 mg (not administered)  carbamide peroxide (DEBROX) 6.5 % otic solution 5 drop (not administered)  acetaminophen (TYLENOL) tablet 500 mg (not administered)  Polyethyl Glycol-Propyl Glycol 0.4-0.3 % SOLN 1 drop (not administered)  fluticasone (FLONASE) 50 MCG/ACT nasal spray 2 spray (not administered)  calcitRIOL (ROCALTROL) capsule 0.5 mcg (not administered)  docusate sodium (COLACE) capsule 100 mg (not administered)  vitamin B-12 (CYANOCOBALAMIN) tablet 500 mcg (not administered)  aspirin tablet 325 mg (not administered)  donepezil (ARICEPT) tablet 10 mg (not administered)  guaiFENesin (MUCINEX) 12 hr tablet 600 mg (not administered)  pravastatin (PRAVACHOL) tablet 40 mg (not administered)  sodium chloride 0.9 % bolus 500 mL (0 mLs Intravenous Stopped 09/15/16 0135)  metoprolol tartrate (LOPRESSOR) tablet 12.5 mg (12.5 mg Oral Given 09/15/16 0002)     Initial Impression / Assessment and Plan / ED Course  I have reviewed the triage vital signs and the nursing notes.  Pertinent labs & imaging results that were available during my care of the patient were reviewed by me and considered in my medical decision making (see chart for details).  CHA2DS2/VAS Stroke Risk Points      6 >= 2 Points: High Risk  1 - 1.99 Points: Medium Risk  0 Points: Low Risk    The previous score was 5 on 03/19/2016.:  Change:         Details    Note: External data might be a  factor in metrics not marked with    Points Metrics   This score determines the patient's risk of having a stroke if the  patient has atrial fibrillation.       1 Has Congestive Heart Failure:  Yes   0 Has Vascular Disease:  No  1 Has Hypertension:  Yes   2 Age:  57   1 Has Diabetes:  Yes   0 Had Stroke:  No Had TIA:  No Had thromboembolism:  No   1 Female:  Yes       83 y.o. female presents with worsening weakness form home. Patient's daughter provides history. She appears to have fairly poor insight into her mother's condition. She states she was told to stop metoprolol for 2 weeks following previous diagnosis of pneumonia but phone encounters show she was instructed to give prn toprol-xl for HR > 100 at home which she has not done. Pt is 100-120 at rest initially, given 12.5 IR metoprolol. Poor po intake at home and suspect she is volume down, given small fluid bolus. Interstitial edema on XR suggests diastolic dysfunction with poor rate control. When ambulated HR goes to 120+ when only standing at side of bed after metoprolol. Rest of 25 mg dose given, plan for admission to hospital to see PT/OT for suspected deconditioning and monitoring of HR/restarting medications. Hospitalist was consulted for admission and will see the patient in the emergency department.   Final Clinical Impressions(s) / ED Diagnoses   Final diagnoses:  Paroxysmal atrial fibrillation ([place])  Acute on chronic diastolic heart failure [facility])    New Prescriptions New Prescriptions   No medications on file     Leo Grosser, MD 09/15/16 [phone]

## 2016-09-15 NOTE — Progress Notes (Signed)
This is a no charge note  Pending admission per Dr. Laneta Simmers  83 year old lady with past medical history of A. fib, hypertension, hyperlipidemia, diabetes mellitus, left bundle blockage, CHF with EF 45%, CKD-III, who presents with generalized weakness for 2 days. Patient stopped taking metoprolol at home. Found to have A. fib with RVR with heart rate up to 111 to 120. Negative troponin, BNP 188, chest x-ray showed vascular congestion. Stable renal function. Thrombocytopenia with platelets 75. Pt is accepted to tele bed for obs. Will check LDH and peripheral smear. Also check UA.   Ivor Costa, MD  [facility] Pager [phone]  If 7PM-7AM, please contact night-coverage [email or web address] Password [email or web address] 09/15/2016, 2:52 AM

## 2016-09-15 NOTE — Progress Notes (Addendum)
Order for strict I&O and urinalysis. Patient incontinent. Notified MD. MD stated to do an I&O cath for urinalysis. Urine collected by I&O cath and sent for UA.

## 2016-09-15 NOTE — Evaluation (Signed)
Clinical/Bedside Swallow Evaluation Patient Details  Name: Anna [name] MRN: [id number] Date of Birth: [birth year]  Today's Date: 09/15/2016 Time: SLP Start Time (ACUTE ONLY): 1520 SLP Stop Time (ACUTE ONLY): 1538 SLP Time Calculation (min) (ACUTE ONLY): 18 min  Past Medical History:  Past Medical History:  Diagnosis Date  . CHF (congestive heart failure) ([place])   . Chronic renal insufficiency, stage III (moderate)   . Dementia   . Diabetes mellitus without complication ([place])   . GI bleeding 11/2013   due to supratherapeutic INR  . Hyperlipidemia   . Hypertension   . Hyperthyroidism    on amiodarone  . LBBB (left bundle branch block)   . Overactive bladder   . Overweight   . Persistent atrial fibrillation ([place])   . Vitamin B12 deficiency   . Vitamin D deficiency    Past Surgical History:  Past Surgical History:  Procedure Laterality Date  . ABDOMINAL HYSTERECTOMY     HPI:  83 year old lady with past medical history of A. fib, hypertension, hyperlipidemia, diabetes mellitus, left bundle blockage, CHF with EF 45%, CKD-III, who presents with generalized weakness for 2 days. Patient stopped taking metoprolol at home. Found to have A. fib with RVR with heart rate up to 111 to 120. Negative troponin, BNP 188, chest x-ray showed vascular congestion.   Assessment / Plan / Recommendation Clinical Impression  Pt demonstrated significant lethargy and confusion impacting safety with PO.  Pt took straw sips of water from daughter; brief oral holding observed followed by swallow with a grimace. Pt seemed to have pain with swallow. One sips also followed by a soft cough.  Pt noted to not follow verbal commands, not to verbalize at all during session despite max cueing and could not hold up her right arm, though she could do so on the left. Daughter and RN report she has been able to answer some orientation questions earlier in the day. Reported this to MD. Overall pts intake has been poor and  pts daughter strongly feels she is dehydrated. Pt may continue current diet when alert. SLP will follow up tomorrow for further diagnostic treatment.     Aspiration Risk  Moderate aspiration risk    Diet Recommendation Thin liquid;Regular   Medication Administration: Whole meds with liquid Supervision: Full supervision/cueing for compensatory strategies Compensations: Slow rate;Small sips/bites Postural Changes: Seated upright at 90 degrees    Other  Recommendations Oral Care Recommendations: Oral care BID   Follow up Recommendations 24 hour supervision/assistance      Frequency and Duration min 2x/week          Prognosis        Swallow Study   General HPI: 83 year old lady with past medical history of A. fib, hypertension, hyperlipidemia, diabetes mellitus, left bundle blockage, CHF with EF 45%, CKD-III, who presents with generalized weakness for 2 days. Patient stopped taking metoprolol at home. Found to have A. fib with RVR with heart rate up to 111 to 120. Negative troponin, BNP 188, chest x-ray showed vascular congestion. Type of Study: Bedside Swallow Evaluation Diet Prior to this Study: Regular;Thin liquids Temperature Spikes Noted: No Respiratory Status: Room air History of Recent Intubation: No Behavior/Cognition: Lethargic/Drowsy;Confused;Doesn't follow directions Oral Care Completed by SLP: No Oral Cavity - Dentition: Adequate natural dentition Self-Feeding Abilities: Total assist Patient Positioning: Upright in bed Baseline Vocal Quality: Not observed    Oral/Motor/Sensory Function Overall Oral Motor/Sensory Function: Other (comment) (Pt would not follow oral motor commands)   Ice Chips Ice  chips: Not tested   Thin Liquid Thin Liquid: Impaired Presentation: Straw Oral Phase Functional Implications: Oral holding Pharyngeal  Phase Impairments: Cough - Delayed;Suspected delayed Swallow    Nectar Thick Nectar Thick Liquid: Not tested   Honey Thick Honey Thick  Liquid: Not tested   Puree Puree: Not tested   Solid   GO   Solid: Not tested       Herbie Baltimore, MA CCC-SLP [phone]  [name] 09/15/2016,4:22 PM

## 2016-09-15 NOTE — Consult Note (Signed)
Cardiology Consult    Patient ID: Anna [name] MRN: [id number], DOB/AGE: February 17, 1929   Admit date: 09/14/2016 Date of Consult: 09/15/2016  Primary Physician: Haywood Pao, MD Primary Cardiologist: Dr. Rayann Heman Requesting Provider: Lonny Prude  Patient Profile    Anna [name] is a 83 yo female well-known to our EP clinic for PAF who previously maintained NSR without anticoagulation for history of GI bleed.  She has a PMH significant for HTN, DM, CKD stage 3, and combined systolic and diastolic heart failure.  She recently presented to the ED with generalized weakness and rate controlled atrial fibrillation.  Past Medical History   Past Medical History:  Diagnosis Date   CHF (congestive heart failure) (HCC)    Chronic renal insufficiency, stage III (moderate)    Dementia    Diabetes mellitus without complication (HCC)    GI bleeding 11/2013   due to supratherapeutic INR   Hyperlipidemia    Hypertension    Hyperthyroidism    on amiodarone   LBBB (left bundle branch block)    Overactive bladder    Overweight    Persistent atrial fibrillation (HCC)    Vitamin B12 deficiency    Vitamin D deficiency     Past Surgical History:  Procedure Laterality Date   ABDOMINAL HYSTERECTOMY       Allergies  Allergies  Allergen Reactions   Pollen Extract     Sneezing, watery eyes    History of Present Illness    HPI largely collected from daughter as patient was not willing to answer questions.  Anna [name] presented to ED with generalized weakness for two days and atrial fibrillation with rates in the 100-130s. She has not been taking her lopressor as she has previously maintained NSR.  She was diagnosed with pneumonia during the week of Aug 29 2016, which seems to have exacerbated her Afib.   Record review reveals several phone calls from her daughter in which she was instructed to take lopressor PRN for HR over 100, but that rate controlled Afib was acceptable in the  immediate period following an infection.  The daughter is very upset about being instructed to hold her beta blocker while in rate controlled Afib in the presence of PNA and absence of anticoagulation.  She stated she converted back to NSR last week, but was again found to be in Afib in the evening of 09/12/16. She was again instructed to hold lopressor for HR under 100.  She reportedly had low PO intake and stopped taking her lasix for the past 2 days.  Daughter and patient were encouraged to increase PO intake and to take PRN lopressor for sustained HR greater than 100. Over the past 2 days, the daughter also states the patient has been choking on thin liquids, which is not normal for her. Daughter brought the patient to the ED because she "has not been feeling well" and for return of her atrial fibrillation.  After a long discussion about the risk of Afib in the presence of infection, volume overload, and dehydration, she stated that she is unhappy with the Afib clinic and wants to speak with an MD.    Inpatient Medications     aspirin  325 mg Oral Daily   [START ON 09/16/2016] calcitRIOL  0.5 mcg Oral Once per day on Mon Wed Fri   cyanocobalamin  500 mcg Oral Daily   donepezil  10 mg Oral QHS   enoxaparin (LOVENOX) injection  40 mg Subcutaneous Daily   fluticasone  2 spray Each Nare Daily   furosemide  20 mg Intravenous Daily   insulin aspart  0-5 Units Subcutaneous QHS   insulin aspart  0-9 Units Subcutaneous TID WC   metoprolol succinate  12.5 mg Oral Daily   metoprolol tartrate  12.5 mg Oral Once   pravastatin  40 mg Oral Daily    Family History    Family History  Problem Relation Age of Onset   Heart disease Father    Diabetes Mother    Thyroid disease Neg Hx     Social History    Social History   Social History   Marital status: Unknown    Spouse name: N/A   Number of children: N/A   Years of education: N/A   Occupational History   Not on file.    Social History Main Topics   Smoking status: Never Smoker   Smokeless tobacco: Never Used   Alcohol use No   Drug use: No   Sexual activity: Not on file   Other Topics Concern   Not on file   Social History Narrative   Pt recently moved to [place] with daughter from [place]:  No fever, chills, or weight changes  Cardiovascular:  No chest pain, dyspnea on exertion, edema, orthopnea, positive for palpitations Dermatological: No rash, lesions/masses Respiratory: No cough, dyspnea Urologic: No hematuria, dysuria Abdominal:   No nausea, vomiting, diarrhea, bright red blood per rectum, melena, or hematemesis Neurologic:  No visual changes, wkns, changes in mental status. All other systems reviewed and are otherwise negative except as noted above.  Physical Exam    Blood pressure 138/78, pulse 100, temperature 97.7 F (36.5 C), temperature source Oral, resp. rate 20, weight 201 lb 11.5 oz (91.5 kg), SpO2 96 %.  General: Does not appear in acute distress, but will not freely answer questions Psych: Flat affect Neuro: Alert. Moves all extremities spontaneously. HEENT: Normal  Neck: Supple without bruits or JVD. Lungs:  Resp regular and unlabored, diminished in bases, no wheezes or rhonchi Heart: Irregular rhythm and regular rate, no murmurs. Abdomen: Soft, non-tender, non-distended, BS + x 4.  Extremities: No clubbing, or cyanosis; trace edema. DP/PT/Radials 2+ and equal bilaterally.  Labs    Troponin [facility] Test)  Recent Labs  09/14/16 2223  TROPIPOC 0.01   No results for input(s): CKTOTAL, CKMB, TROPONINI in the last 72 hours. Lab Results  Component Value Date   WBC 6.2 09/14/2016   HGB 10.4 (L) 09/14/2016   HCT 35.0 (L) 09/14/2016   MCV 77.8 (L) 09/14/2016   PLT 75 (L) 09/14/2016     Recent Labs Lab 09/14/16 2215  NA 137  K 4.6  CL 109  CO2 21*  BUN 22*  CREATININE 1.22*  CALCIUM 9.1  GLUCOSE 174*    No results found for: CHOL, HDL, LDLCALC, TRIG Lab Results  Component Value Date   DDIMER 2.28 (H) 02/05/2016     Radiology Studies    Dg Chest 2 View  Result Date: 09/15/2016 CLINICAL DATA:  Initial evaluation for with a acute cough, AFib with hypertension. EXAM: CHEST  2 VIEW COMPARISON:  None. FINDINGS: Cardiomegaly.  Mediastinal silhouette within normal limits. Lungs hypoinflated. Diffuse vascular congestion with interstitial prominence, suspected to reflect pulmonary interstitial edema. Superimposed streaky left basilar opacities favored to reflect atelectasis. No other focal infiltrates identified. No pleural effusion. No pneumothorax. No acute osseous abnormality. IMPRESSION: 1. Cardiomegaly  with diffuse pulmonary vascular congestion and interstitial prominence, suggesting mild pulmonary interstitial edema. 2. Superimposed patchy and linear left basilar opacity, favored to reflect atelectasis. Electronically Signed   By: Jeannine Boga M.D.   On: 09/15/2016 00:03    ECG & Cardiac Imaging    EKG 09/14/16: Atrial fibrillation, ventricular rate 107, LBBB  Echocardiogram 08/27/15: Study Conclusions - Procedure narrative: Transthoracic echocardiography. Image  quality was adequate. The study was technically difficult. - Left ventricle: The cavity size was normal. Wall thickness was increased in a pattern of mild LVH. Systolic function was mildly reduced. The estimated ejection fraction was in the range of 45% to 50%. Diffuse hypokinesis. Doppler parameters are consistent with abnormal left ventricular relaxation (grade 1 diastolic dysfunction). Doppler parameters are consistent with high ventricular filling pressure. - Ventricular septum: Septal motion showed abnormal function and dyssynergy. - Aortic valve: There was trivial regurgitation. - Mitral valve: There was mild regurgitation. - Left atrium: The atrium was mildly dilated. - Right atrium: The atrium was mildly dilated. -  Pulmonary arteries: Systolic pressure was mildly increased.  Impressions: - Technically difficult; septal dyssynergy due to LBBB; overall mildly reduced LV function; grade 1 diastolic dysfunction with elevated LV filling pressure; mild biatrial enlargement; mild Mr; mild TR with mildly elevated pulmonary pressure.   Assessment & Plan    1. Atrial fibrillation - telemetry reveals rate controlled Afib in the 90s - potassium WNL - recommend checking Mag and TSH (ordered) - 12.5 lopressor given at 0002 + toprol XL 12.5 given at 0330 today - recommend dosing lopressor with IR formula to allow easier dose adjustments, consider increasing lopressor to 12.5 mg BID - recommend repeat TTE to evaluate function and valvular abnormalities (ordered) - continue ASA, holding AC for history of GI bleed - 20 mg lasix IV daily, continue and check K daily - continue telemetry - difficult to determine if patient is symptomatic with Afib - recommend UA to r/o UTI infection as a contributing cause of Afib  - This patients CHA2DS2-VASc Score and unadjusted Ischemic Stroke Rate (% per year) is equal to 9.7 % stroke rate/year from a score of 6  (age, sex, CHF, Dm, HTN); however, AC held for hx of GI bleed   2. Combined systolic and diastolic heart failure - 0000000 echo with LVEF 45-50% with grade 1 diastolic dysfunction - patient does not appear hypervolemic on exam - CXR with mild B pleural effusion   3. New onset dysphagia - on exam, pt daughter reports choking on this liquids - recommend FEES / PFS to r/o aspiration as a possible cause for infection   4. HTN - BP 133/86 - 157-89 - will titrate Toprol and lasix first before adding antihypertensives   5. CKD stage 3 - baseline sCr 1.3-1.5 - admission sCr 1.22 - strict Is & Os    6. DM - per primary team   Signed, Minette Brine, PA-C 09/15/2016, 10:30 AM

## 2016-09-15 NOTE — H&P (Addendum)
History and Physical    Anna [name] [id number] DOB: [birth year] DOA: 09/14/2016  PCP: Haywood Pao, MD  Patient coming from: Home  Chief Complaint: Weakness  HPI: Anna [name] is a 83 y.o. female with medical history significant of by systolic and diastolic heart failure, diabetes mellitus, type II, essential hypertension, hyperlipidemia, hyperthyroidism, dementia. For the past two days, patient has had symptoms of weakness. She has associated rapid heart rates into the 130s. Her daughter has been working with the atrial fibrillation clinic and has been giving her metoprolol XL prn for sustained heart rates which has not helped regulate her heart rate. In addition to tachycardia, she has had increased coughing with eating. Her daughter has been helping her use the incentive spirometer for which the patient has worsening effort. She has recently been treated for a pneumonia with Augmentin. Course was completed and repeat chest x-ray showed improvement of infiltrate. Symptoms overall, have made her feel very weak and she has not been on her feet for the past 5 days.  ED Course: Vitals: Afebrile, tachycardia to 110s. Normotensive. On room air Labs: BNP of 188. CO2 of 21. Creatinine of 1.22. Platelets of 75 Imaging: CXR significant for cardiomegaly and diffuse vascular congestion/interstitial edema in addition to left basilar opacity thought to be atelectasis Medications/Course: 522mL NS bolus  Review of Systems: Review of Systems  Constitutional: Negative for chills, fever and malaise/fatigue.  Respiratory: Positive for cough, shortness of breath and wheezing. Negative for sputum production.   Cardiovascular: Negative for chest pain, palpitations and leg swelling.  Gastrointestinal: Negative for abdominal pain, constipation, diarrhea, nausea and vomiting.  Genitourinary: Negative for dysuria and frequency.  Neurological: Positive for weakness.  All other systems reviewed and  are negative.   Past Medical History:  Diagnosis Date  . CHF (congestive heart failure) ([place])   . Chronic renal insufficiency, stage III (moderate)   . Dementia   . Diabetes mellitus without complication ([place])   . GI bleeding 11/2013   due to supratherapeutic INR  . Hyperlipidemia   . Hypertension   . Hyperthyroidism    on amiodarone  . LBBB (left bundle branch block)   . Overactive bladder   . Overweight   . Persistent atrial fibrillation ([place])   . Vitamin B12 deficiency   . Vitamin D deficiency     Past Surgical History:  Procedure Laterality Date  . ABDOMINAL HYSTERECTOMY       reports that she has never smoked. She does not have any smokeless tobacco history on file. She reports that she does not drink alcohol or use drugs.  Allergies  Allergen Reactions  . Pollen Extract     Sneezing, watery eyes    Family History  Problem Relation Age of Onset  . Heart disease Father   . Diabetes Mother   . Thyroid disease Neg Hx     Prior to Admission medications   Medication Sig Start Date End Date Taking? Authorizing Provider  acetaminophen (TYLENOL) 500 MG tablet Take 500 mg by mouth every 6 (six) hours as needed for fever.    Yes Historical Provider, MD  aspirin 325 MG tablet Take 325 mg by mouth daily.   Yes Historical Provider, MD  calcitRIOL (ROCALTROL) 0.5 MCG capsule Take 0.5 mcg by mouth 3 (three) times a week. Monday, Wednesday, and Friday only 01/07/15  Yes Historical Provider, MD  carbamide peroxide (DEBROX) 6.5 % otic solution Place 5 drops into both ears 2 (two) times daily. Patient taking  differently: Place 5 drops into both ears daily as needed.  02/09/16  Yes Geradine Girt, DO  docusate sodium (COLACE) 100 MG capsule Take 100 mg by mouth daily as needed for mild constipation.   Yes Historical Provider, MD  donepezil (ARICEPT) 10 MG tablet Take 10 mg by mouth at bedtime.   Yes Historical Provider, MD  fluticasone (FLONASE) 50 MCG/ACT nasal spray Place 2 sprays  into both nostrils daily.   Yes Historical Provider, MD  furosemide (LASIX) 20 MG tablet Take 1.5 tablets by mouth as needed.  01/19/16  Yes Historical Provider, MD  furosemide (LASIX) 20 MG tablet Take 10 mg by mouth daily.   Yes Historical Provider, MD  glimepiride (AMARYL) 2 MG tablet Take 2 mg by mouth daily with breakfast.   Yes Historical Provider, MD  guaiFENesin (MUCINEX) 600 MG 12 hr tablet Take 600 mg by mouth 2 (two) times daily as needed for cough. Reported on 12/22/2015   Yes Historical Provider, MD  loratadine (ALAVERT) 10 MG tablet Take 10 mg by mouth daily as needed for allergies. Every 2 days    Yes Historical Provider, MD  Polyethyl Glycol-Propyl Glycol (SYSTANE) 0.4-0.3 % SOLN Apply 1 drop to eye 2 (two) times daily as needed (dry eyes).   Yes Historical Provider, MD  pravastatin (PRAVACHOL) 40 MG tablet Take 40 mg by mouth daily.   Yes Historical Provider, MD  vitamin B-12 (CYANOCOBALAMIN) 500 MCG tablet Take 500 mcg by mouth daily.   Yes Historical Provider, MD  Vitamin D, Ergocalciferol, (DRISDOL) 50000 UNITS CAPS capsule Take 50,000 Units by mouth once a week. Mondays 01/07/15  Yes Historical Provider, MD  [place] test strip PATIENT TO CHECK SUGARS ONCE DAILY. 11/19/15   Historical Provider, MD  metoprolol succinate (TOPROL XL) 25 MG 24 hr tablet Take 0.5 tablets (12.5 mg total) by mouth daily. May take a second time each day for rapid HR.  If HR is slow ok to hold Patient taking differently: Take 12.5 mg by mouth daily as needed (rapid HR). May take a second time each day for rapid HR.  If HR is slow ok to hold 08/14/16   Isaiah Serge, NP    Physical Exam: Vitals:   09/14/16 2200 09/15/16 0000 09/15/16 0030 09/15/16 0308  BP: 133/86 146/93 140/88 138/78  Pulse: 91 99 115 100  Resp: 23 25 26 20   Temp:    97.7 F (36.5 C)  TempSrc:    Oral  SpO2: 97% 98% 100% 96%     Constitutional: NAD, calm, comfortable Eyes: PERRL, lids and conjunctivae normal ENMT:  Mucous membranes are moist. Posterior pharynx clear of any exudate or lesions.Normal dentition.  Neck: normal, supple, no masses, no thyromegaly Respiratory: clear to auscultation bilaterally, no wheezing, no crackles. Normal respiratory effort. No accessory muscle use.  Cardiovascular: Regular rate and irregular rhythm, no murmurs / rubs / gallops. No extremity edema. 2+ pedal pulses.  Abdomen: no tenderness, no masses palpated. Bowel sounds positive.  Musculoskeletal: no clubbing / cyanosis. No joint deformity upper and lower extremities. Good ROM, no contractures. Normal muscle tone.  Skin: no rashes, lesions, ulcers. No induration Neurologic: CN 2-12 grossly intact. Sensation intact, DTR normal. Psychiatric: Normal judgment and impaired. Alert. Patient not answering my questions for orientation. Flat affect.    Labs on Admission: I have personally reviewed following labs and imaging studies  CBC:  Recent Labs Lab 09/14/16 2215  WBC 6.2  NEUTROABS 3.2  HGB 10.4*  HCT  35.0*  MCV 77.8*  PLT 75*   Basic Metabolic Panel:  Recent Labs Lab 09/14/16 2215  NA 137  K 4.6  CL 109  CO2 21*  GLUCOSE 174*  BUN 22*  CREATININE 1.22*  CALCIUM 9.1   GFR: CrCl cannot be calculated (Unknown ideal weight.). Liver Function Tests: No results for input(s): AST, ALT, ALKPHOS, BILITOT, PROT, ALBUMIN in the last 168 hours. No results for input(s): LIPASE, AMYLASE in the last 168 hours. No results for input(s): AMMONIA in the last 168 hours. Coagulation Profile: No results for input(s): INR, PROTIME in the last 168 hours. Cardiac Enzymes: No results for input(s): CKTOTAL, CKMB, CKMBINDEX, TROPONINI in the last 168 hours. BNP (last 3 results) No results for input(s): PROBNP in the last 8760 hours. HbA1C: No results for input(s): HGBA1C in the last 72 hours. CBG: No results for input(s): GLUCAP in the last 168 hours. Lipid Profile: No results for input(s): CHOL, HDL, LDLCALC, TRIG,  CHOLHDL, LDLDIRECT in the last 72 hours. Thyroid Function Tests: No results for input(s): TSH, T4TOTAL, FREET4, T3FREE, THYROIDAB in the last 72 hours. Anemia Panel: No results for input(s): VITAMINB12, FOLATE, FERRITIN, TIBC, IRON, RETICCTPCT in the last 72 hours. Urine analysis:    Component Value Date/Time   COLORURINE YELLOW 02/05/2016 [place] 02/05/2016 1252   LABSPEC 1.016 02/05/2016 1252   PHURINE 6.0 02/05/2016 1252   GLUCOSEU NEGATIVE 02/05/2016 1252   HGBUR NEGATIVE 02/05/2016 1252   BILIRUBINUR NEGATIVE 02/05/2016 1252   KETONESUR NEGATIVE 02/05/2016 1252   PROTEINUR NEGATIVE 02/05/2016 1252   UROBILINOGEN 0.2 01/27/2015 1353   NITRITE NEGATIVE 02/05/2016 1252   LEUKOCYTESUR NEGATIVE 02/05/2016 1252   Sepsis Labs: !!!!!!!!!!!!!!!!!!!!!!!!!!!!!!!!!!!!!!!!!!!! @LABRCNTIP (procalcitonin:4,lacticidven:4) )No results found for this or any previous visit (from the past 240 hour(s)).   Radiological Exams on Admission: Dg Chest 2 View  Result Date: 09/15/2016 CLINICAL DATA:  Initial evaluation for with a acute cough, AFib with hypertension. EXAM: CHEST  2 VIEW COMPARISON:  None. FINDINGS: Cardiomegaly.  Mediastinal silhouette within normal limits. Lungs hypoinflated. Diffuse vascular congestion with interstitial prominence, suspected to reflect pulmonary interstitial edema. Superimposed streaky left basilar opacities favored to reflect atelectasis. No other focal infiltrates identified. No pleural effusion. No pneumothorax. No acute osseous abnormality. IMPRESSION: 1. Cardiomegaly with diffuse pulmonary vascular congestion and interstitial prominence, suggesting mild pulmonary interstitial edema. 2. Superimposed patchy and linear left basilar opacity, favored to reflect atelectasis. Electronically Signed   By: Jeannine Boga M.D.   On: 09/15/2016 00:03    EKG: Independently reviewed. Slight tachycardia. LBBB. Atrial fibrillation  Assessment/Plan Principal  Problem:   Atrial fibrillation (HCC) Active Problems:   DM (diabetes mellitus) type II controlled with renal manifestation (HCC)   CKD (chronic kidney disease), stage III   Hyperthyroidism   Acute combined systolic and diastolic heart failure (HCC)   Diabetes mellitus with complication (HCC)   Atrial fibrillation with RVR (HCC)    Persistent atrial fibrillation with RVR CHA2DS2-VASc Score is 6. Not on anticoagulation secondary to GI bleeding. Symptoms possibly worsened by recent illness (pneumonia), which has now resolved. Was previously taken off betablocker secondary to hypotension and bradycardia. Currently rate controlled on metoprolol -continue metoprolol -cardiology consult  Thrombocytopenia New. Last platelet count of 176 one 7 months ago. -repeat CBC -pathologist smear review and LDH pending  Hyperthyroidism Previous history. Thought to be secondary to amiodarone. Last TSH of 0.738 today  Hyperlipidemia -continue pravastatin  Diabetes mellitus, type 2 -SSI while in the hospital  Dementia -continue  Aricept  Chronic kidney disease, stage III Stable. Baseline around 0000000  Combined systolic and diastolic congestive heart failure No previous echocardiogram present in chart however, previous cardiology note mentions an EF of 45-50% with grade 1 diastolic dysfunction. Seems fluid up from chest x-ray but euvolemic on exam. Possibly could be heading in direction which would likely be secondary to recent afib w/ RVR -lasix 20mg  IV daily -daily weights -in/outs -echocardiogram   DVT prophylaxis: Lovenox Code Status: Full code Family Communication: Daughter at bedside Disposition Plan: Discharge home in 2-3 days Consults called: Cardiology Admission status: Inpatient, telemetry   Cordelia Poche, MD [facility] Pager [phone]  If 7PM-7AM, please contact night-coverage [email or web address] Password [email or web address]  09/15/2016, 7:50 AM

## 2016-09-15 NOTE — Progress Notes (Signed)
PT Cancellation Note  Patient Details Name: Nandika Landaverde MRN: [id number] DOB: 1929-02-18   Cancelled Treatment:    Reason Eval/Treat Not Completed: Fatigue/lethargy limiting ability to participate (per RN.)   Claretha Cooper 09/15/2016, 4:14 PM Tresa Endo PT [phone]

## 2016-09-15 NOTE — ED Notes (Signed)
Attempted to walk PT with walker daughter state her mother will not ne able to walk after being sick for few days. When this Haematologist PT up PT HR went from 102-117 MD have been made aware

## 2016-09-15 NOTE — Telephone Encounter (Signed)
Late entry; Pt daughter [name] called at 5 minutes until 5 on Wednesday 09/14/2016 with concerns over her mother.  She has previously discussed concerns with RN about mothers fluctuating HR.  She was instructed not to give the pt extra metoprolol since she was on PRN metoprolol.   Pt heart rate was in the 70s upon rest and was only going up with exertion.  It was explained to pt daughter that this was likely due to dehydration as she had told RN and myself that pt had only 15 0z. Of water that day.  She stated her mother was not taking in a lot of fluids.  I consulted Roderic Palau, NP and told pt daughter that if she continued to not intake fluids, became less responsive she may need to go to ER.  If pt stays stable, continue to get patient to drink more water and do not take the metoprolol.  She was asked multiple times to give pt more water and told that she would need to contact patient PCP if she was not feeling better or improving.  Daughter voiced understanding that Roderic Palau, NP did not feel that the issues of fatigue, coughing and such were cardiac related and that she would need to contact mothers PCP.

## 2016-09-15 NOTE — Telephone Encounter (Signed)
New Message  Pt daughter call requesting to speak with RN. Pt daughter states pt is currently in the hospital. She states she was instructed by the afib clinic not to give pt her metoprolol medication for two days. Pt daughter she had to take pt to the hospital and was told but the ER that pt should not have come off the medication for two days. Pt daughter would like to discuss with RN. Please call back to discuss

## 2016-09-16 ENCOUNTER — Inpatient Hospital Stay (HOSPITAL_BASED_OUTPATIENT_CLINIC_OR_DEPARTMENT_OTHER): Payer: Medicare HMO

## 2016-09-16 DIAGNOSIS — I4891 Unspecified atrial fibrillation: Secondary | ICD-10-CM

## 2016-09-16 DIAGNOSIS — E0590 Thyrotoxicosis, unspecified without thyrotoxic crisis or storm: Secondary | ICD-10-CM | POA: Diagnosis not present

## 2016-09-16 DIAGNOSIS — I480 Paroxysmal atrial fibrillation: Secondary | ICD-10-CM | POA: Diagnosis not present

## 2016-09-16 DIAGNOSIS — I5041 Acute combined systolic (congestive) and diastolic (congestive) heart failure: Secondary | ICD-10-CM | POA: Diagnosis not present

## 2016-09-16 DIAGNOSIS — I10 Essential (primary) hypertension: Secondary | ICD-10-CM | POA: Diagnosis not present

## 2016-09-16 DIAGNOSIS — N183 Chronic kidney disease, stage 3 (moderate): Secondary | ICD-10-CM | POA: Diagnosis not present

## 2016-09-16 DIAGNOSIS — I481 Persistent atrial fibrillation: Secondary | ICD-10-CM | POA: Diagnosis not present

## 2016-09-16 LAB — GLUCOSE, CAPILLARY
GLUCOSE-CAPILLARY: 144 mg/dL — AB (ref 65–99)
GLUCOSE-CAPILLARY: 130 mg/dL — AB (ref 65–99)
Glucose-Capillary: 126 mg/dL — ABNORMAL HIGH (ref 65–99)
Glucose-Capillary: 146 mg/dL — ABNORMAL HIGH (ref 65–99)
Glucose-Capillary: 154 mg/dL — ABNORMAL HIGH (ref 65–99)
Glucose-Capillary: 163 mg/dL — ABNORMAL HIGH (ref 65–99)

## 2016-09-16 LAB — BASIC METABOLIC PANEL
Anion gap: 8 (ref 5–15)
BUN: 22 mg/dL — AB (ref 6–20)
CO2: 26 mmol/L (ref 22–32)
Calcium: 9.1 mg/dL (ref 8.9–10.3)
Chloride: 109 mmol/L (ref 101–111)
Creatinine, Ser: 1.32 mg/dL — ABNORMAL HIGH (ref 0.44–1.00)
GFR calc Af Amer: 41 mL/min — ABNORMAL LOW (ref >60)
GFR, EST NON AFRICAN AMERICAN: 36 mL/min — AB (ref >60)
GLUCOSE: 131 mg/dL — AB (ref 65–99)
POTASSIUM: 3.6 mmol/L (ref 3.5–5.1)
Sodium: 143 mmol/L (ref 135–145)

## 2016-09-16 LAB — CBC
HCT: 34.6 % — ABNORMAL LOW (ref 36.0–46.0)
Hemoglobin: 10 g/dL — ABNORMAL LOW (ref 12.0–15.0)
MCH: 22.6 pg — AB (ref 26.0–34.0)
MCHC: 28.9 g/dL — AB (ref 30.0–36.0)
MCV: 78.3 fL (ref 78.0–100.0)
PLATELETS: 240 10*3/uL (ref 150–400)
RBC: 4.42 MIL/uL (ref 3.87–5.11)
RDW: 16.7 % — AB (ref 11.5–15.5)
WBC: 6.8 10*3/uL (ref 4.0–10.5)

## 2016-09-16 LAB — ECHOCARDIOGRAM COMPLETE: Weight: 3227.53 oz

## 2016-09-16 LAB — INFLUENZA PANEL BY PCR (TYPE A & B)
INFLBPCR: NEGATIVE
Influenza A By PCR: NEGATIVE

## 2016-09-16 MED ORDER — PERFLUTREN LIPID MICROSPHERE
1.0000 mL | INTRAVENOUS | Status: AC | PRN
  Administered 2016-09-16: 2 mL via INTRAVENOUS
  Filled 2016-09-16: qty 10

## 2016-09-16 MED ORDER — PERFLUTREN LIPID MICROSPHERE
INTRAVENOUS | Status: AC
  Filled 2016-09-16: qty 10

## 2016-09-16 NOTE — Care Management CC44 (Signed)
Condition Code 44 Documentation Completed  Patient Details  Name: Audi Higuchi MRN: [id number] Date of Birth: 08/10/1929   Condition Code 44 given:  Yes Patient signature on Condition Code 44 notice:  Yes Documentation of 2 MD's agreement:  Yes Code 44 added to claim:  Yes    Guadalupe Maple, RN 09/16/2016, 3:08 PM

## 2016-09-16 NOTE — Progress Notes (Signed)
Progress Note  Patient Name: Anna [name] Date of Encounter: 09/16/2016  Primary Cardiologist: Dr. Nikki [name]  Subjective   Feels well.  Denies pain.  Unable to answer many questions  Inpatient Medications    Scheduled Meds: . aspirin  325 mg Oral Daily  . calcitRIOL  0.5 mcg Oral Once per day on Mon Wed Fri  . chlorhexidine  15 mL Mouth/Throat BID  . cyanocobalamin  500 mcg Oral Daily  . donepezil  10 mg Oral QHS  . enoxaparin (LOVENOX) injection  40 mg Subcutaneous Daily  . fluticasone  2 spray Each Nare Daily  . furosemide  10 mg Oral Daily  . insulin aspart  0-5 Units Subcutaneous QHS  . insulin aspart  0-9 Units Subcutaneous TID WC  . metoprolol tartrate  25 mg Oral BID  . pravastatin  40 mg Oral Daily   Continuous Infusions:  PRN Meds: acetaminophen, carbamide peroxide, docusate sodium, guaiFENesin, loratadine, ondansetron (ZOFRAN) IV, polyvinyl alcohol   Vital Signs    Vitals:   09/15/16 1016 09/15/16 1347 09/15/16 2117 09/16/16 0654  BP:  (!) 150/82 (!) 162/84 (!) 144/74  Pulse:  (!) 126 (!) 105 87  Resp:  18 18 18   Temp:  98.3 F (36.8 C) 98.6 F (37 C) 97.8 F (36.6 C)  TempSrc:  Axillary Oral Oral  SpO2:  97% 96% 94%  Weight: 91.5 kg (201 lb 11.5 oz)       Intake/Output Summary (Last 24 hours) at 09/16/16 1054 Last data filed at 09/16/16 0958  Gross per 24 hour  Intake              120 ml  Output              250 ml  Net             -130 ml   Filed Weights   09/15/16 1016  Weight: 91.5 kg (201 lb 11.5 oz)    Telemetry    Atrial fibrillation.  Rates <100 bpm - Personally Reviewed  ECG    n/a  Physical Exam   GEN: No acute distress.  Resting comfortably. Neck: No JVD Cardiac: Irregularly irregular.  no murmurs, rubs, or gallops.  Respiratory: Faint crackles at R base.  GI: Soft, nontender, non-distended  MS: No edema; No deformity. Neuro:  AAOx1.  Psych: Normal affect  Labs    Chemistry Recent Labs Lab 09/14/16 2215  09/16/16 0623  NA 137 143  K 4.6 3.6  CL 109 109  CO2 21* 26  GLUCOSE 174* 131*  BUN 22* 22*  CREATININE 1.22* 1.32*  CALCIUM 9.1 9.1  GFRNONAA 39* 36*  GFRAA 45* 41*  ANIONGAP 7 8     Hematology Recent Labs Lab 09/14/16 2215 09/16/16 0623  WBC 6.2 6.8  RBC 4.50 4.42  HGB 10.4* 10.0*  HCT 35.0* 34.6*  MCV 77.8* 78.3  MCH 23.1* 22.6*  MCHC 29.7* 28.9*  RDW 16.6* 16.7*  PLT 75* 240    Cardiac EnzymesNo results for input(s): TROPONINI in the last 168 hours.  Recent Labs Lab 09/14/16 2223  TROPIPOC 0.01     BNP Recent Labs Lab 09/14/16 2215  BNP 188.2*     DDimer No results for input(s): DDIMER in the last 168 hours.   Radiology    Dg Chest 2 View  Result Date: 09/15/2016 CLINICAL DATA:  Initial evaluation for with a acute cough, AFib with hypertension. EXAM: CHEST  2 VIEW COMPARISON:  None. FINDINGS: Cardiomegaly.  Mediastinal silhouette within normal limits. Lungs hypoinflated. Diffuse vascular congestion with interstitial prominence, suspected to reflect pulmonary interstitial edema. Superimposed streaky left basilar opacities favored to reflect atelectasis. No other focal infiltrates identified. No pleural effusion. No pneumothorax. No acute osseous abnormality. IMPRESSION: 1. Cardiomegaly with diffuse pulmonary vascular congestion and interstitial prominence, suggesting mild pulmonary interstitial edema. 2. Superimposed patchy and linear left basilar opacity, favored to reflect atelectasis. Electronically Signed   By: Anna [name] M.D.   On: 09/15/2016 00:03    Cardiac Studies   Echo pending  Patient Profile     Ms. Anna [name] is an 26F with paroxysmal atrial fibrillation, chronic systolic and diastolic heart failure (LVEF 45-50%), hypertension, diabetes, and CKD 3 here with atrial fibrillation with rapid ventricular response in the setting of recent community acquired pneumonia.   Assessment & Plan    # Atrial fibrillation: Remains in atrial  fibrillation.  Rates are well-controlled on metoprolol 25mg  q12h.  She is not on anticoagulation due to fall risk.  Continue aspirin.    # Hypertension: BP above goal.  Improved on higher dose metoprolol.  Continue to monitor for now.   # Chronic systolic and diastolic heart failure: LVEF 45-50%.  Echo pending.  Started home lasix 10mg  po daily.  Continue metoprolol.  If BP permits, may add low dose ACE-I or ARB.   # Hyperlipidemia: Continue pravastatin.  Time spent: 60 minutes-Greater than 50% of this time was spent in counseling, explanation of diagnosis, planning of further management, and coordination of care.  Ms. Anna [name]' daughter is very anxious about her mother's care and had many questions and concerns.  She prefers to follow up at the [facility] office with either myself or Dr. Stanford [name].   Signed, Anna Latch, MD  09/16/2016, 10:54 AM

## 2016-09-16 NOTE — Progress Notes (Signed)
PROGRESS NOTE    Anna [name]  [id number] DOB: 1928-11-27 DOA: 09/14/2016 PCP: Haywood Pao, MD   Brief Narrative:  Anna [name] is a 83 y.o. female with medical history significant of by systolic and diastolic heart failure, diabetes mellitus, type II, essential hypertension, hyperlipidemia, hyperthyroidism, dementia. For the past two days, patient has had symptoms of weakness. She has associated rapid heart rates into the 130s. Her daughter has been working with the atrial fibrillation clinic and has been giving her metoprolol XL prn for sustained heart rates which has not helped regulate her heart rate.Pt also increased coughing with eating. She has recently been treated for a pneumonia with Augmentin- 10 days. Course was completed and repeat chest x-ray showed improvement of infiltrate. Symptoms overall, have made her feel very weak and she has not been on her feet for the past 5 days.  Assessment & Plan:   Principal Problem:   Atrial fibrillation ([place]) Active Problems:   DM (diabetes mellitus) type II controlled with renal manifestation (HCC)   CKD (chronic kidney disease), stage III   Hyperthyroidism   Acute combined systolic and diastolic heart failure (HCC)   Diabetes mellitus with complication (HCC)   Atrial fibrillation with RVR (HCC)  Persistent atrial fibrillation with RVR CHA2DS2-VASc Scoreis 6. Not on anticoagulation secondary to falls. Symptoms possibly worsened by recent illness (pneumonia), which has now resolved. Was previously taken off betablocker secondary to hypotension and bradycardia. Currently rate controlled on metoprolol -continue metoprolol -cardiology eval appreciated, awaiting ECHO, appears euvolemic, cont home lasix- 10mg  daily, cont aspirin. - F/u ECHO, cards eval, PT recs, can d/c to tomorrow.  Weakness- Generalized. Per daughter weakness has improved. Speech therapist reported to me that pts speech was slightly dysarthric, and ignoring  her right side. CT head ordered, pts daughter declined. I went to re-evaluate pt, I did not notice facial droop, left side neglect. Speech appears slightly slurred but pts daughter says this is normal for her, present for several years, strenght 5/5 all extremities, but with a  Mild right arm drift, which daughter says is normal, after sustaining a fall and subsequent fracture to right shoulder years ago. Daughter says pt is at her baseline and rarely makes sentences, just one word answers, has severe dementia. - Talked extensively with daughter, we decided to cancel Ct head Wo contrast. - Pt eval pending - Likely due to Afib with RVR, recovering from PNA. - Flu negative.  Thrombocytopenia- resolved. 240- 09/16/16.  Hyperthyroidism Previous history. Thought to be secondary to amiodarone. -  TSH checked - 0.738   Hyperlipidemia -continue pravastatin  Diabetes mellitus, type 2 -SSI while in the hospital  Dementia -continue Aricept  Chronic kidney disease, stage III Stable. Baseline around 0000000  Combined systolic and diastolic congestive heart failure No previous echocardiogram present in chart however, previous cardiology note mentions an EF of 45-50% with grade 1 diastolic dysfunction. Seems fluid up from chest x-ray but euvolemic on exam, likely secondary to recent afib w/ RVR -cont home lasix 10mg  per day, per cards -daily weights -in/outs -echocardiogram   DVT prophylaxis: Lovenox (Lovenox/Heparin/SCD's/anticoagulated/None (if comfort care) Code Status: Full (Full/Partial - specify details) Family Communication: Daughter at bedside. Discussed extensively with her- 08/23/16.  Disposition Plan: ECHO, CArds evaluation, PT recommendation.   Consultants:   Cardiology  Procedures: Echo  Antimicrobials: None.  Subjective: PAtient has no complaints. She is able to answer simple questions appropriately and surprising co-operated with me for neuro exam, considering her  dementia.  Objective: Vitals:   09/15/16 1347 09/15/16 2117 09/16/16 0654 09/16/16 1500  BP: (!) 150/82 (!) 162/84 (!) 144/74 120/70  Pulse: (!) 126 (!) 105 87 82  Resp: 18 18 18 18   Temp: 98.3 F (36.8 C) 98.6 F (37 C) 97.8 F (36.6 C) 98.1 F (36.7 C)  TempSrc: Axillary Oral Oral Oral  SpO2: 97% 96% 94% 94%  Weight:        Intake/Output Summary (Last 24 hours) at 09/16/16 1937 Last data filed at 09/16/16 1900  Gross per 24 hour  Intake              480 ml  Output                0 ml  Net              480 ml   Filed Weights   09/15/16 1016  Weight: 91.5 kg (201 lb 11.5 oz)    Examination:  General exam: Appears calm and comfortable  Respiratory system: Clear to auscultation. Respiratory effort normal. Cardiovascular system: S1 & S2 heard, RRR. No JVD, murmurs, rubs, gallops or clicks. No pedal edema. Gastrointestinal system: Abdomen is nondistended, soft and nontender. No organomegaly or masses felt. Normal bowel sounds heard. Central nervous system: Alert and oriented to person, place, could tell me daughters name, but not what she had for lunch. Strenght- 5/5 all extremities, Cr N- 2-12 intact. Sensation intact. Mild right arm drift. Extremities: Symmetric 5 x 5 power. Skin: No rashes, lesions or ulcers  Data Reviewed: I have personally reviewed following labs and imaging studies  CBC:  Recent Labs Lab 09/14/16 2215 09/16/16 0623  WBC 6.2 6.8  NEUTROABS 3.2  --   HGB 10.4* 10.0*  HCT 35.0* 34.6*  MCV 77.8* 78.3  PLT 75* [id number]   Basic Metabolic Panel:  Recent Labs Lab 09/14/16 2215 09/15/16 0850 09/16/16 0623  NA 137  --  143  K 4.6  --  3.6  CL 109  --  109  CO2 21*  --  26  GLUCOSE 174*  --  131*  BUN 22*  --  22*  CREATININE 1.22*  --  1.32*  CALCIUM 9.1  --  9.1  MG  --  2.0  --    CBG:  Recent Labs Lab 09/15/16 1721 09/15/16 2123 09/16/16 0714 09/16/16 1242 09/16/16 1642  GLUCAP 126* 154* 144* 163* 130*   Thyroid Function  Tests:  Recent Labs  09/15/16 0850  TSH 0.738   Radiology Studies: Dg Chest 2 View  Result Date: 09/15/2016 CLINICAL DATA:  Initial evaluation for with a acute cough, AFib with hypertension. EXAM: CHEST  2 VIEW COMPARISON:  None. FINDINGS: Cardiomegaly.  Mediastinal silhouette within normal limits. Lungs hypoinflated. Diffuse vascular congestion with interstitial prominence, suspected to reflect pulmonary interstitial edema. Superimposed streaky left basilar opacities favored to reflect atelectasis. No other focal infiltrates identified. No pleural effusion. No pneumothorax. No acute osseous abnormality. IMPRESSION: 1. Cardiomegaly with diffuse pulmonary vascular congestion and interstitial prominence, suggesting mild pulmonary interstitial edema. 2. Superimposed patchy and linear left basilar opacity, favored to reflect atelectasis. Electronically Signed   By: Jeannine Boga M.D.   On: 09/15/2016 00:03   Scheduled Meds: . aspirin  325 mg Oral Daily  . calcitRIOL  0.5 mcg Oral Once per day on Mon Wed Fri  . chlorhexidine  15 mL Mouth/Throat BID  . cyanocobalamin  500 mcg Oral Daily  . donepezil  10 mg Oral QHS  .  enoxaparin (LOVENOX) injection  40 mg Subcutaneous Daily  . fluticasone  2 spray Each Nare Daily  . furosemide  10 mg Oral Daily  . insulin aspart  0-5 Units Subcutaneous QHS  . insulin aspart  0-9 Units Subcutaneous TID WC  . metoprolol tartrate  25 mg Oral BID  . pravastatin  40 mg Oral Daily   Continuous Infusions:   LOS: 1 day  Bethena Roys, MD [facility] Pager [phone]  If 7PM-7AM, please contact night-coverage [email or web address] Password [facility] 09/16/2016, 7:37 PM

## 2016-09-16 NOTE — Progress Notes (Signed)
Speech Language Pathology Treatment: Dysphagia  Patient Details Name: Anna [name] MRN: [id number] DOB: 1928-09-10 Today's Date: 09/16/2016 Time: [id number] SLP Time Calculation (min) (ACUTE ONLY): 20 min  Assessment / Plan / Recommendation Clinical Impression  Pt seen with lunch meal, more alert today than yesterday, but behavior still concerning for neuromuscular and language impairment. Pt repositioned upright with max assist, tray placed in front of her. Though fully alert pt could not sustain attention to tray, often picking up items in her visual field or exploring IV site. She seemed to prefer to her left visual field and fed herself with her left hand though she is typically right handed. SLP cued her to initiate feeding with her right hand, but movement appeared weaker and discoordinated, often overshooting her target. Daughter reports this has been weak for a while. Pt was able to follow 50% of verbal only commands, but was more consistent when visual cues (pointing or gestures) were given. Pt also named 3/4 objects with a single word response, I could not elicit a phrase from her. She seemed to have slightly dysarthric speech at the word level,suspect lingual weakness. Discussed finding with MD yesterday and new MD today. Pt is tolerating her current diet, but an occasional cough with liquids is concerning. Await further w/u to determine if MBS may be beneficial.    HPI HPI: 83 year old lady with past medical history of A. fib, hypertension, hyperlipidemia, diabetes mellitus, left bundle blockage, CHF with EF 45%, CKD-III, who presents with generalized weakness for 2 days. Patient stopped taking metoprolol at home. Found to have A. fib with RVR with heart rate up to 111 to 120. Negative troponin, BNP 188, chest x-ray showed vascular congestion.      SLP Plan  Continue with current plan of care     Recommendations  Diet recommendations: Dysphagia 3 (mechanical soft);Thin  liquid Liquids provided via: Cup;Straw Medication Administration: Whole meds with liquid Supervision: Full supervision/cueing for compensatory strategies;Patient able to self feed;Trained caregiver to feed patient (caregiver to give assit) Compensations: Slow rate;Small sips/bites                General recommendations: Rehab consult Oral Care Recommendations: Oral care BID Follow up Recommendations: 24 hour supervision/assistance Plan: Continue with current plan of care       Anna Elison Worrel, MA CCC-SLP [id number]  Anna [name] 09/16/2016, 2:54 PM

## 2016-09-16 NOTE — Progress Notes (Signed)
PT Cancellation Note  Patient Details Name: Anna [name] MRN: [id number] DOB: Apr 06, 1929   Cancelled Treatment:    Reason Eval/Treat Not Completed: Other (comment)--attempted x2 this am; pt about to begin echo, then pt eating and dtr declined; will attempt again next day or as schedule permits   [facility] 09/16/2016, 9:50 AM

## 2016-09-16 NOTE — Care Management Obs Status (Signed)
[place] NOTIFICATION   Patient Details  Name: Anna [name] MRN: [id number] Date of Birth: [birth year]   Medicare Observation Status Notification Given:  Yes    Guadalupe Maple, RN 09/16/2016, 3:08 PM

## 2016-09-16 NOTE — Progress Notes (Signed)
  Echocardiogram 2D Echocardiogram has been performed with definity.  Aggie Cosier 09/16/2016, 11:09 AM

## 2016-09-17 ENCOUNTER — Observation Stay (HOSPITAL_COMMUNITY): Payer: Medicare HMO

## 2016-09-17 DIAGNOSIS — I10 Essential (primary) hypertension: Secondary | ICD-10-CM | POA: Diagnosis not present

## 2016-09-17 DIAGNOSIS — I5033 Acute on chronic diastolic (congestive) heart failure: Secondary | ICD-10-CM | POA: Diagnosis not present

## 2016-09-17 DIAGNOSIS — E1121 Type 2 diabetes mellitus with diabetic nephropathy: Secondary | ICD-10-CM | POA: Diagnosis not present

## 2016-09-17 DIAGNOSIS — E0590 Thyrotoxicosis, unspecified without thyrotoxic crisis or storm: Secondary | ICD-10-CM

## 2016-09-17 DIAGNOSIS — I480 Paroxysmal atrial fibrillation: Secondary | ICD-10-CM | POA: Diagnosis not present

## 2016-09-17 DIAGNOSIS — R05 Cough: Secondary | ICD-10-CM | POA: Diagnosis not present

## 2016-09-17 DIAGNOSIS — I481 Persistent atrial fibrillation: Secondary | ICD-10-CM | POA: Diagnosis not present

## 2016-09-17 DIAGNOSIS — I5041 Acute combined systolic (congestive) and diastolic (congestive) heart failure: Secondary | ICD-10-CM | POA: Diagnosis not present

## 2016-09-17 DIAGNOSIS — N183 Chronic kidney disease, stage 3 (moderate): Secondary | ICD-10-CM | POA: Diagnosis not present

## 2016-09-17 LAB — GLUCOSE, CAPILLARY
GLUCOSE-CAPILLARY: 187 mg/dL — AB (ref 65–99)
Glucose-Capillary: 141 mg/dL — ABNORMAL HIGH (ref 65–99)
Glucose-Capillary: 190 mg/dL — ABNORMAL HIGH (ref 65–99)

## 2016-09-17 MED ORDER — FUROSEMIDE 20 MG PO TABS: 20.0000 mg | ORAL_TABLET | Freq: Every day | ORAL | 0 refills | Status: DC

## 2016-09-17 MED ORDER — METOPROLOL TARTRATE 25 MG PO TABS: 25.0000 mg | ORAL_TABLET | Freq: Two times a day (BID) | ORAL | 2 refills | Status: DC

## 2016-09-17 MED ORDER — FUROSEMIDE 20 MG PO TABS: 20.0000 mg | ORAL_TABLET | Freq: Once | ORAL | Status: DC

## 2016-09-17 MED ORDER — FUROSEMIDE 20 MG PO TABS: 20.0000 mg | ORAL_TABLET | Freq: Every day | ORAL | Status: DC

## 2016-09-17 NOTE — Progress Notes (Addendum)
Progress Note  Patient Name: Anna [name] Date of Encounter: 09/17/2016  Primary Cardiologist: Dr. Rayann Heman  (pt requests switch to [facility] or [place])  Subjective   Feels well.  Denies pain.  Nodes and shakes her head in response to questions.   Inpatient Medications    Scheduled Meds: . aspirin  325 mg Oral Daily  . calcitRIOL  0.5 mcg Oral Once per day on Mon Wed Fri  . chlorhexidine  15 mL Mouth/Throat BID  . cyanocobalamin  500 mcg Oral Daily  . donepezil  10 mg Oral QHS  . enoxaparin (LOVENOX) injection  40 mg Subcutaneous Daily  . fluticasone  2 spray Each Nare Daily  . [START ON 09/18/2016] furosemide  20 mg Oral Daily  . insulin aspart  0-5 Units Subcutaneous QHS  . insulin aspart  0-9 Units Subcutaneous TID WC  . metoprolol tartrate  25 mg Oral BID  . pravastatin  40 mg Oral Daily   Continuous Infusions:  PRN Meds: acetaminophen, carbamide peroxide, docusate sodium, guaiFENesin, loratadine, ondansetron (ZOFRAN) IV, polyvinyl alcohol   Vital Signs    Vitals:   09/16/16 0654 09/16/16 1500 09/16/16 2147 09/17/16 0602  BP: (!) 144/74 120/70 (!) 152/89 130/79  Pulse: 87 82 90 82  Resp: 18 18 18 18   Temp: 97.8 F (36.6 C) 98.1 F (36.7 C) 97.8 F (36.6 C) 98.1 F (36.7 C)  TempSrc: Oral Oral Oral Axillary  SpO2: 94% 94% 97% 96%  Weight:      Height:        Intake/Output Summary (Last 24 hours) at 09/17/16 1309 Last data filed at 09/17/16 0959  Gross per 24 hour  Intake              480 ml  Output                0 ml  Net              480 ml   Filed Weights   09/15/16 1016  Weight: 91.5 kg (201 lb 11.5 oz)    Telemetry    Atrial fibrillation.  Rates <100 bpm Occasional PVCs. - Personally Reviewed  ECG    n/a  Physical Exam   GEN: No acute distress.  Resting comfortably. Neck: No JVD Cardiac: Irregularly irregular.  no murmurs, rubs, or gallops.  Respiratory: Faint crackles at R base.  GI: Soft, nontender, non-distended  MS: No  edema; No deformity. Neuro:  AAOx1.  Psych: Normal affect  Labs    Chemistry  Recent Labs Lab 09/14/16 2215 09/16/16 0623  NA 137 143  K 4.6 3.6  CL 109 109  CO2 21* 26  GLUCOSE 174* 131*  BUN 22* 22*  CREATININE 1.22* 1.32*  CALCIUM 9.1 9.1  GFRNONAA 39* 36*  GFRAA 45* 41*  ANIONGAP 7 8     Hematology  Recent Labs Lab 09/14/16 2215 09/16/16 0623  WBC 6.2 6.8  RBC 4.50 4.42  HGB 10.4* 10.0*  HCT 35.0* 34.6*  MCV 77.8* 78.3  MCH 23.1* 22.6*  MCHC 29.7* 28.9*  RDW 16.6* 16.7*  PLT 75* 240    Cardiac EnzymesNo results for input(s): TROPONINI in the last 168 hours.   Recent Labs Lab 09/14/16 2223  TROPIPOC 0.01     BNP  Recent Labs Lab 09/14/16 2215  BNP 188.2*     DDimer No results for input(s): DDIMER in the last 168 hours.   Radiology    Dg Chest [facility]  Result Date: 09/17/2016 CLINICAL DATA:  Cough. EXAM: PORTABLE CHEST 1 VIEW COMPARISON:  09/14/2016 FINDINGS: The cardiac silhouette is mildly enlarged. Mediastinal contours appear intact. Thoracic aorta is torturous. There is bilateral hilar/perihilar fullness. There is no evidence of focal airspace consolidation, pleural effusion or pneumothorax. Minimal pulmonary vascular congestion. Osseous structures are without acute abnormality. Soft tissues are grossly normal. IMPRESSION: Bilateral hilar/ perihilar fullness which may be seen with pulmonary arterial hypertension. Minimal pulmonary vascular congestion. Electronically Signed   By: Fidela Salisbury M.D.   On: 09/17/2016 10:49    Cardiac Studies   Echo pending  Patient Profile     Anna [name] is an 68F with paroxysmal atrial fibrillation, chronic systolic and diastolic heart failure (LVEF 45-50%), hypertension, diabetes, and CKD 3 here with atrial fibrillation with rapid ventricular response in the setting of recent community acquired pneumonia.   Assessment & Plan    # Atrial fibrillation: Remains in atrial fibrillation.  Rates  are well-controlled on metoprolol 25mg  q12h.  She is not on anticoagulation due to fall risk.  Continue aspirin.    # Hypertension: BP well-controlled on metoprolol  Improved on higher dose metoprolol.  Continue to monitor for now.   # Chronic systolic and diastolic heart failure: LVEF 45-50% previously and 35-40% this admission.  Clinically she is euvolemic and renal function improved from baseline.  There is no evidence of ischemia.  May be rate related.  Increase lasix to 20mg  po daily.  Faint crackles noted at R base.  If BP and renal function permit, may add low dose ACE-I or ARB as an outpatient.  Check BMP at follow up.  # Hyperlipidemia: Continue pravastatin.  Time spent: 45 minutes-Greater than 50% of this time was spent in counseling, explanation of diagnosis, planning of further management, and coordination of care.  Anna [name]' daughter is very anxious about her mother's care and had many questions and concerns.  She prefers to follow up at the [facility] office with either myself or Dr. Stanford Breed.   Signed, Skeet Latch, MD  09/17/2016, 1:09 PM

## 2016-09-17 NOTE — Discharge Summary (Signed)
Physician Discharge Summary  Tyniesha Leh [id number] DOB: 1929/06/26 DOA: 09/14/2016  PCP: Haywood Pao, MD  Admit date: 09/14/2016 Discharge date: 09/17/2016  Admitted From: Home Disposition:  Home  Recommendations for Outpatient Follow-up:  1. Follow up with PCP in 1-2 weeks 2. Please follow up with Cardiology in 1-2 weeks 3. Please obtain BMP/CBC in one week  Home Health:No  Equipment/Devices:Walker, wheelchair (patient already owned)   Discharge Condition:Stable  CODE STATUS:Full  Diet recommendation:Heart Healthy   Brief/Interim Summary: Anna [name] a 83 y.o.femalewith medical history significant of by systolic and diastolic heart failure, diabetes mellitus, type II, essential hypertension, hyperlipidemia, hyperthyroidism, dementia. On admission, patient had symptoms of weakness. She had associated rapid heart rates into the 130s. Her daughter had been working with the atrial fibrillation clinic and has been giving her metoprolol XL prn for sustained heart rates which has not helped regulate her heart rate.  Pt also increased coughing with eating. She has recently been treated for a pneumonia with Augmentin- 10 days. Course was completed and repeat chest x-ray showed improvement of infiltrate. Symptoms overall, have made her feel very weak and she has not been on her feet for the past 5 days prior to admission.  She was found to be in atrial fibrillation during the hospital stay and this was treated.  Cardiology followed with [place].   Discharge Diagnoses:   Atrial fibrillation with RVR Ms. Breyer was admitted on 1/25 and found to be in RVR to the 130s.  She was recently treated for an acute pneumonia and then afterwards she began to feel weak.  She was not on anticoagulation, despite a high CHADS-Vasc score of 6 due to h/o GI bleeding.  She was only on an aspirin.  She was previously taken a beta blocker, but had apparently been off of it due to hypotension and  weakness.  She was initiated on metoprolol while in house and was monitored on telemetry.  Cardiology was consulted and Metoprolol tartrate was initiated and finally she was rate controlled at 25mg  BID. This was her discharge dosing.  TTE was done and showed EF of 35-40% with no aortic stenosis.  Aspirin was continued on discharge.    Thrombocytopenia Monitored closely, no bleeding.  Normalized on day of discharge (? Falsely low)  Generalized weakness This was related to acute pneumonia and Afib.  She had been debilitated for 5-7 days with her acute illness and while in the hospital.  Family refused PT on multiple occasions because her daughter felt that the therapist was trying to advance her mothers activity too quickly.  Ms. [place] was dizzy on discharge, however, her HR and BP were remaining normal during the dizzy episode.  This was felt to be due to debilitation as opposed to orthostasis or bradycardia related to the beta blocker.  Had a long discussion with daughter on day of discharge and strongly recommended home health PT, but this was declined.  Patient did have a speech evaluation, but the daughter thought this was inadequate. Flu was negative.  TSH was normal.  She did have some mild anemia, microcytic.  This should be followed up in the outpatient setting, appears chronic in nature.  She had no active bleeding while in house.    DM (diabetes mellitus) type II controlled with renal manifestation  She was initially placed on SSI while in hospital and only receiving small amounts of insulin.  She will be re-started on glimepiride at discharge.  We discussed her A1C  and how with her age it is likely too low.  I advised her to give her mother a less strict diet at home.     CKD (chronic kidney disease), stage III This was not an acute issue while she was in the hospital.  Her Cr remained around her baseline of 1.3-1.5    Combined systolic and diastolic heart failure Initial CXR was  concerning for some pulmonary edema.  She was started on IV lasix to help with this.  The worsening was thought to be due to the rapid rate and her volume status did improve with improvement in her heart rate.  Cardiology was consulted and she was quickly transitioned from IV lasix to PO lasix at her home dose of 10mg .  Metoprolol tartrate was initiated and finally was rate controlled at 25mg  BID.  This was the discharge dose.    HLD Not an acute issue, statin was continued.   Dementia Patient with limited speech.  She was interactive and answered questions when directed at her.  Daughter did most of the discussion with medicine team. Donepezil was continued for her.   H/O Hyperthyroidism Previous history.  TSH normal at 0.738 on admission.    Discharge Instructions  Discharge Instructions    Call MD for:    Complete by:  As directed    Call MD for:  difficulty breathing, headache or visual disturbances    Complete by:  As directed    Call MD for:  extreme fatigue    Complete by:  As directed    Call MD for:  persistant dizziness or light-headedness    Complete by:  As directed    Call MD for:  severe uncontrolled pain    Complete by:  As directed    Call MD for:  temperature >100.4    Complete by:  As directed    Diet - low sodium heart healthy    Complete by:  As directed    Discharge instructions    Complete by:  As directed    Ms. [place] are doing well and ready for discharge!  The only change to your medication is your metoprolol.   At home you have metoprolol succinate which is the long acting form.  You have been changed to metoprolol TARTRATE, which is taken twice a day.  This is the medication that has controlled your heart rate.  Please pick up the new prescription for the different metoprolol sent to your pharmacy.    Follow with Primary MD Haywood Pao, MD in 7 - 14 days.   Follow up with Cardiology, Dr. Oval Linsey, in 7-14 days.    Get bloodwork  checked  by your primary doctor or cardiologist in 7-14 days ( we routinely change or add medications that can affect your baseline labs and fluid status, therefore we recommend that you get the mentioned basic workup next visit with your PCP, your PCP may decide not to get them or add new tests based on their clinical decision)   Activity: Slow return to activity.  Ask your PCP to order PT if needed   Disposition Home with family   Diet:   Heart Healthy, 1200 mL fluid restriction   For Heart failure patients - Check your Weight same time everyday, if you gain over 2 pounds, or you develop more leg swelling, experience more shortness of breath or chest pain, call your Primary MD immediately.    On your next visit with  your primary care physician please Get Medicines reviewed and adjusted.   If you experience worsening of your admission symptoms, develop shortness of breath, life threatening emergency, suicidal or homicidal thoughts you must seek medical attention immediately by calling 911 or calling your MD immediately  if symptoms less severe.  You Must read instructions/literature along with all the possible adverse reactions/side effects for all the Medicines you take and that have been prescribed to you. Take any new Medicines after you have completely understood and accept all the possible adverse reactions/side effects.   Do not take more than prescribed Pain, Sleep and Anxiety Medications  Wear Seat belts while driving.   Please note  You were cared for by a hospitalist during your hospital stay. If you have any questions about your discharge medications or the care you received while you were in the hospital after you are discharged, you can call the unit and asked to speak with the hospitalist on call if the hospitalist that took care of you is not available. Once you are discharged, your primary care physician will handle any further medical issues. Please note that NO REFILLS  for any discharge medications will be authorized once you are discharged, as it is imperative that you return to your primary care physician (or establish a relationship with a primary care physician if you do not have one) for your aftercare needs so that they can reassess your need for medications and monitor your lab values.   Increase activity slowly    Complete by:  As directed      Allergies as of 09/17/2016      Reactions   Pollen Extract    Sneezing, watery eyes      Medication List    STOP taking these medications   metoprolol succinate 25 MG 24 hr tablet Commonly known as:  TOPROL XL     TAKE these medications   ACCU-CHEK AVIVA PLUS test strip Generic drug:  glucose blood PATIENT TO CHECK SUGARS ONCE DAILY.   acetaminophen 500 MG tablet Commonly known as:  TYLENOL Take 500 mg by mouth every 6 (six) hours as needed for fever.   ALAVERT 10 MG tablet Generic drug:  loratadine Take 10 mg by mouth daily as needed for allergies. Every 2 days   aspirin 325 MG tablet Take 325 mg by mouth daily.   calcitRIOL 0.5 MCG capsule Commonly known as:  ROCALTROL Take 0.5 mcg by mouth 3 (three) times a week. Monday, Wednesday, and Friday only   carbamide peroxide 6.5 % otic solution Commonly known as:  DEBROX Place 5 drops into both ears 2 (two) times daily. What changed:  when to take this  reasons to take this   docusate sodium 100 MG capsule Commonly known as:  COLACE Take 100 mg by mouth daily as needed for mild constipation.   donepezil 10 MG tablet Commonly known as:  ARICEPT Take 10 mg by mouth at bedtime.   fluticasone 50 MCG/ACT nasal spray Commonly known as:  FLONASE Place 2 sprays into both nostrils daily.   furosemide 20 MG tablet Commonly known as:  LASIX Take 1 tablet (20 mg total) by mouth daily. What changed:  how much to take  Another medication with the same name was removed. Continue taking this medication, and follow the directions you see  here.   glimepiride 2 MG tablet Commonly known as:  AMARYL Take 2 mg by mouth daily with breakfast.   guaiFENesin 600 MG 12 hr tablet  Commonly known as:  MUCINEX Take 600 mg by mouth 2 (two) times daily as needed for cough. Reported on 12/22/2015   metoprolol tartrate 25 MG tablet Commonly known as:  LOPRESSOR Take 1 tablet (25 mg total) by mouth 2 (two) times daily.   pravastatin 40 MG tablet Commonly known as:  PRAVACHOL Take 40 mg by mouth daily.   SYSTANE 0.4-0.3 % Soln Generic drug:  Polyethyl Glycol-Propyl Glycol Apply 1 drop to eye 2 (two) times daily as needed (dry eyes).   vitamin B-12 500 MCG tablet Commonly known as:  CYANOCOBALAMIN Take 500 mcg by mouth daily.   Vitamin D (Ergocalciferol) 50000 units Caps capsule Commonly known as:  DRISDOL Take 50,000 Units by mouth once a week. Mondays      Follow-up Information    Schedule an appointment as soon as possible for a visit with Haywood Pao, MD.   Specialty:  Internal Medicine Why:  for re-evaluation Contact information: [place] [address] [phone]        Skeet Latch, MD Follow up.   Specialty:  Cardiology Why:  Cardiology Hospital Follow-Up on 09/30/2016 at 9:00AM.  Contact information: [place] [address] [phone]          Allergies  Allergen Reactions  . Pollen Extract     Sneezing, watery eyes    Consultations:  Cardiology, Dr. Oval Linsey   Procedures/Studies: Dg Chest 2 View  Result Date: 09/15/2016 CLINICAL DATA:  Initial evaluation for with a acute cough, AFib with hypertension. EXAM: CHEST  2 VIEW COMPARISON:  None. FINDINGS: Cardiomegaly.  Mediastinal silhouette within normal limits. Lungs hypoinflated. Diffuse vascular congestion with interstitial prominence, suspected to reflect pulmonary interstitial edema. Superimposed streaky left basilar opacities favored to reflect atelectasis. No other focal infiltrates  identified. No pleural effusion. No pneumothorax. No acute osseous abnormality. IMPRESSION: 1. Cardiomegaly with diffuse pulmonary vascular congestion and interstitial prominence, suggesting mild pulmonary interstitial edema. 2. Superimposed patchy and linear left basilar opacity, favored to reflect atelectasis. Electronically Signed   By: Jeannine Boga M.D.   On: 09/15/2016 00:03   Dg Chest Port 1 View  Result Date: 09/17/2016 CLINICAL DATA:  Cough. EXAM: PORTABLE CHEST 1 VIEW COMPARISON:  09/14/2016 FINDINGS: The cardiac silhouette is mildly enlarged. Mediastinal contours appear intact. Thoracic aorta is torturous. There is bilateral hilar/perihilar fullness. There is no evidence of focal airspace consolidation, pleural effusion or pneumothorax. Minimal pulmonary vascular congestion. Osseous structures are without acute abnormality. Soft tissues are grossly normal. IMPRESSION: Bilateral hilar/ perihilar fullness which may be seen with pulmonary arterial hypertension. Minimal pulmonary vascular congestion. Electronically Signed   By: Fidela Salisbury M.D.   On: 09/17/2016 10:49       Subjective: Patient reports feeling fine.  Her daughter had many questions about her pneumonia, rattling in her chest and issues with telling fluid vs. pnuemonia based on symptoms.  She was also concerned about her DM and we discussed DM diet and a higher A1C goal for Ms. Kreuzer.  The daughter is very involved in the care of her mother and obviously cares deeply for her mothers health.   Discharge Exam: Vitals:   09/17/16 0602 09/17/16 1334  BP: 130/79 111/71  Pulse: 82 79  Resp: 18 18  Temp: 98.1 F (36.7 C) 97.8 F (36.6 C)   Vitals:   09/16/16 1500 09/16/16 2147 09/17/16 0602 09/17/16 1334  BP: 120/70 (!) 152/89 130/79 111/71  Pulse: 82 90 82 79  Resp: 18  18 18 18   Temp: 98.1 F (36.7 C) 97.8 F (36.6 C) 98.1 F (36.7 C) 97.8 F (36.6 C)  TempSrc: Oral Oral Axillary Oral  SpO2: 94% 97%  96% 96%  Weight:      Height:        General exam: Elderly woman, sitting in bed eating breakfast, Appears calm and comfortable  Eyes: Anicteric sclerae, no conjunctival injection.  Respiratory system: Crackles in right base. Respiratory effort normal.  Some coughing with eating Cardiovascular system: S1 & S2 heard, Irreg Irreg. No murmur.  No pedal edema. Gastrointestinal system: Abdomen is nondistended, soft and nontender. +BS Central nervous system: Alert, answers questions. No focal neurological deficits. Skin: She has a nodule on buttock, covered with soft dressing.  No rash Psychiatry: Judgement and insight appear normal. Mood & affect appropriate.     The results of significant diagnostics from this hospitalization (including imaging, microbiology, ancillary and laboratory) are listed below for reference.     Microbiology: No results found for this or any previous visit (from the past 240 hour(s)).   Labs: BNP (last 3 results)  Recent Labs  02/05/16 1205 09/14/16 2215  BNP 117.7* [id number]   Basic Metabolic Panel:  Recent Labs Lab 09/14/16 2215 09/15/16 0850 09/16/16 0623  NA 137  --  143  K 4.6  --  3.6  CL 109  --  109  CO2 21*  --  26  GLUCOSE 174*  --  131*  BUN 22*  --  22*  CREATININE 1.22*  --  1.32*  CALCIUM 9.1  --  9.1  MG  --  2.0  --    Liver Function Tests: No results for input(s): AST, ALT, ALKPHOS, BILITOT, PROT, ALBUMIN in the last 168 hours. No results for input(s): LIPASE, AMYLASE in the last 168 hours. No results for input(s): AMMONIA in the last 168 hours. CBC:  Recent Labs Lab 09/14/16 2215 09/16/16 0623  WBC 6.2 6.8  NEUTROABS 3.2  --   HGB 10.4* 10.0*  HCT 35.0* 34.6*  MCV 77.8* 78.3  PLT 75* 240   Cardiac Enzymes: No results for input(s): CKTOTAL, CKMB, CKMBINDEX, TROPONINI in the last 168 hours. BNP: Invalid input(s): POCBNP CBG:  Recent Labs Lab 09/16/16 0714 09/16/16 1242 09/16/16 1642 09/17/16 0755  09/17/16 1147  GLUCAP 144* 163* 130* 141* 190*   Thyroid function studies  Recent Labs  09/15/16 0850  TSH 0.738   Anemia work up No results for input(s): VITAMINB12, FOLATE, FERRITIN, TIBC, IRON, RETICCTPCT in the last 72 hours. Urinalysis    Component Value Date/Time   COLORURINE STRAW (A) 09/15/2016 1550   APPEARANCEUR CLEAR 09/15/2016 1550   LABSPEC 1.006 09/15/2016 1550   PHURINE 5.0 09/15/2016 1550   GLUCOSEU NEGATIVE 09/15/2016 1550   HGBUR NEGATIVE 09/15/2016 1550   BILIRUBINUR NEGATIVE 09/15/2016 1550   KETONESUR NEGATIVE 09/15/2016 1550   PROTEINUR NEGATIVE 09/15/2016 1550   UROBILINOGEN 0.2 01/27/2015 1353   NITRITE NEGATIVE 09/15/2016 1550   LEUKOCYTESUR NEGATIVE 09/15/2016 1550   Sepsis Labs Invalid input(s): PROCALCITONIN,  WBC,  LACTICIDVEN Microbiology No results found for this or any previous visit (from the past 240 hour(s)).   Time coordinating discharge: 60 minutes  SIGNED:   Gilles Chiquito, MD  [facility] 09/17/2016, 3:23 PM   If 7PM-7AM, please contact night-coverage [email or web address] Password [email or web address]

## 2016-09-17 NOTE — Progress Notes (Signed)
PROGRESS NOTE    Anna [name]  [id number] DOB: February 24, 1929 DOA: 09/14/2016 PCP: Haywood Pao, MD    Brief Narrative:  Anna Brooker [place] a 83 y.o.femalewith medical history significant of by systolic and diastolic heart failure, diabetes mellitus, type II, essential hypertension, hyperlipidemia, hyperthyroidism, dementia. On admission, patient had symptoms of weakness. She had associated rapid heart rates into the 130s. Her daughter had been working with the atrial fibrillation clinic and has been giving her metoprolol XL prn for sustained heart rates which has not helped regulate her heart rate.  Pt also increased coughing with eating. She has recently been treated for a pneumonia with Augmentin- 10 days. Course was completed and repeat chest x-ray showed improvement of infiltrate. Symptoms overall, have made her feel very weak and she has not been on her feet for the past 5 days prior to admission.  Cardiology following.    Assessment & Plan:   Atrial fibrillation with RVR, CHADS VASC 6, on aspirin  - HR controlled today on Metoprolol tartrate 25mg  BID, which will be continued on discharge - Cardiology following, will see in outpatient setting - Spoke extensively with daughter today about plan - TTE today was not overly different from previous report of EF 40% - Lasix will be increased to home dose of 20mg  - She had some mild crackles at right base, CXR shows mild congestion.  Dr. Oval Linsey recommended an extra dose of lasix today, however, family was concerned about her dizziness and this was declined, I cancelled.   Generalized weakness - Likely deconditioning after pneumonia and then Afib with RVR - Family has on multiple occasions refused PT evaluation, and did again today - Recommend evaluation for outpatient PT by PCP.   DM (diabetes mellitus) type II controlled with renal manifestation - She has been receiving very small amounts of sliding scale insulin - She is on  glimepiride at home, resume at discharge - She has a very well controlled A1C per daughter, we discussed diet at length and how that at 87 she could relax the very strict diabetic diet she is following now.     CKD (chronic kidney disease), stage III - Baseline 1.2-1.3, last check at 1.32 - Continue to monitor in the outpatient setting - She also has a mild normocytic anemia which is likely related, follow up with PCP.     Acute combined systolic and diastolic heart failure  - Reported previous EF of around 45%.   - TTE this admission with EF 35-40% - Cardiology following - She is on beta blocker for Afib and lasix for volume, she is euvolemic at this time, but with some crackles in bases of lungs - possibly atelectatic as well - She may benefit from a small dose of ACE-I, but given dizziness and debility, can be added in the outpatient setting.   - Follow up planned with Cardiology.   Hyperlipidemia - Continue pravastatin at discharge  Dementia - Continue aricept.    DVT prophylaxis: Lovenox Code Status: Full Family Communication: Daughter at bedside Disposition Plan: discharge today   Consultants:   Cardiology  PT  Procedures:   TTE 1/27  Antimicrobials:   None    Subjective: Patient reports feeling fine.  Her daughter had many questions about her pneumonia, rattling in her chest and issues with telling fluid vs. pnuemonia based on symptoms.  She was also concerned about her DM and we discussed DM diet and a higher A1C goal for Anna [name].  The daughter  is very involved in the care of her mother and obviously cares deeply for her mothers health.   Saw Daughter and patient at about 1pm.  They had seen Dr. Oval Linsey in Cardiology who planned to see them outpatient. TTE was largely unchanged. She is euvolemic.  Family wants to take her home.  They are refusing PT evaluation.   Saw Daughter and patient again around 3pm.  Apparently when getting up to get dressed (stood  twice to get on pants) patient felt dizzy and had to sit back down.  There was no syncope, no loss of consciousness.  Only associated symptom was clammy hands.  Family is very opposed to rehab or even PT evaluation stating that they have been through it before and know what to do.  BP during this event was 123/62 with HR in the 60s.  She was not standing long enough to get true orthostatics.  I discussed this with the daughter.  She is only on metoprolol for Afib and lasix daily.  They are not going to take the extra dose of lasix as ordered and only take the AM dose already given, with plans to start 20mg  of lasix tomorrow morning.  I advised PT 2 times, family refused.  We will do a trial of patient sitting in wheelchair to see if she can tolerate being transported home in daughter's car.    Saw patient and daughter again at 330pm.  Anna [name] is doing very well in wheelchair and family more reassured that she will do well going home.  Plan for discharge.    Objective: Vitals:   09/16/16 0654 09/16/16 1500 09/16/16 2147 09/17/16 0602  BP: (!) 144/74 120/70 (!) 152/89 130/79  Pulse: 87 82 90 82  Resp: 18 18 18 18   Temp: 97.8 F (36.6 C) 98.1 F (36.7 C) 97.8 F (36.6 C) 98.1 F (36.7 C)  TempSrc: Oral Oral Oral Axillary  SpO2: 94% 94% 97% 96%  Weight:      Height:        Intake/Output Summary (Last 24 hours) at 09/17/16 0944 Last data filed at 09/16/16 1900  Gross per 24 hour  Intake              480 ml  Output                0 ml  Net              480 ml   Filed Weights   09/15/16 1016  Weight: 201 lb 11.5 oz (91.5 kg)    Examination:  General exam: Elderly woman, sitting in bed eating breakfast, Appears calm and comfortable  Eyes: Anicteric sclerae, no conjunctival injection.  Respiratory system: Crackles in right base. Respiratory effort normal.  Some coughing with eating Cardiovascular system: S1 & S2 heard, Irreg Irreg. No murmur.  No pedal edema. Gastrointestinal  system: Abdomen is nondistended, soft and nontender. +BS Central nervous system: Alert, answers questions. No focal neurological deficits. Skin: She has a nodule on buttock, covered with soft dressing.  No rash Psychiatry: Judgement and insight appear normal. Mood & affect appropriate.     Data Reviewed: I have personally reviewed following labs and imaging studies  CBC:  Recent Labs Lab 09/14/16 2215 09/16/16 0623  WBC 6.2 6.8  NEUTROABS 3.2  --   HGB 10.4* 10.0*  HCT 35.0* 34.6*  MCV 77.8* 78.3  PLT 75* [id number]   Basic Metabolic Panel:  Recent Labs Lab 09/14/16 2215 09/15/16 0850  09/16/16 0623  NA 137  --  143  K 4.6  --  3.6  CL 109  --  109  CO2 21*  --  26  GLUCOSE 174*  --  131*  BUN 22*  --  22*  CREATININE 1.22*  --  1.32*  CALCIUM 9.1  --  9.1  MG  --  2.0  --    CBG:  Recent Labs Lab 09/15/16 2123 09/16/16 0714 09/16/16 1242 09/16/16 1642 09/17/16 0755  GLUCAP 154* 144* 163* 130* 141*   Lipid Profile: No results for input(s): CHOL, HDL, LDLCALC, TRIG, CHOLHDL, LDLDIRECT in the last 72 hours. Thyroid Function Tests:  Recent Labs  09/15/16 0850  TSH 0.738      Radiology Studies: No results found.    Scheduled Meds: . aspirin  325 mg Oral Daily  . calcitRIOL  0.5 mcg Oral Once per day on Mon Wed Fri  . chlorhexidine  15 mL Mouth/Throat BID  . cyanocobalamin  500 mcg Oral Daily  . donepezil  10 mg Oral QHS  . enoxaparin (LOVENOX) injection  40 mg Subcutaneous Daily  . fluticasone  2 spray Each Nare Daily  . furosemide  10 mg Oral Daily  . insulin aspart  0-5 Units Subcutaneous QHS  . insulin aspart  0-9 Units Subcutaneous TID WC  . metoprolol tartrate  25 mg Oral BID  . pravastatin  40 mg Oral Daily   Continuous Infusions:   LOS: 1 day    Time spent: 60 minutes    Gilles Chiquito, MD [facility] Pager [phone]  If 7PM-7AM, please contact night-coverage [email or web address] Password [facility] 09/17/2016, 9:44 AM

## 2016-09-17 NOTE — Care Management Note (Signed)
Case Management Note  Patient Details  Name: Anna [name] MRN: [id number] Date of Birth: 1928-12-19  Subjective/Objective:  afib with RVR, generalized weakness, DM                  Action/Plan: Discharge Planning: Chart reviewed. Spoke to attending and family is declining [place] at this time.   PCP  Haywood Pao MD  Expected Discharge Date:  09/17/16               Expected Discharge Plan:  Home/Self Care  In-House Referral:  NA  Discharge planning Services  CM Consult  Post Acute Care Choice:  NA Choice offered to:  NA  DME Arranged:  N/A DME Agency:  NA  HH Arranged:  NA HH Agency:  NA  Status of Service:  Completed, signed off  If discussed at [place] of Stay Meetings, dates discussed:    Additional Comments:  Erenest Rasher, RN 09/17/2016, 3:42 PM

## 2016-09-17 NOTE — Progress Notes (Signed)
PT Cancellation Note  Patient Details Name: Anna [name] MRN: [id number] DOB: May 17, 1929   Cancelled Treatment:    Reason Eval/Treat Not Completed: Patient declined, no reason specified (pt leaving, refused PT)   [facility] 09/17/2016, 3:50 PM

## 2016-09-19 NOTE — Telephone Encounter (Signed)
This was given to our team at the hospital to address.

## 2016-09-23 ENCOUNTER — Telehealth: Payer: Self-pay | Admitting: Cardiovascular Disease

## 2016-09-23 NOTE — Telephone Encounter (Signed)
Spoke to daughter  she states patient has been having breathing heavy  When sitting or when standing deep loud breathing pattern-  notice cough. ,wheezing Unable to weigh patient daily. No lower edema. Patient is using 2 extra pillows per daughter. Per daughter some bloating in abdomen.  patient is lasix 20 mg daily since hospitalization ,but daughter has given patient extra 10 mg  Twice this week  Wednesday and today oxygen sat have been 95-96 % sitting if walking 94-90% per daughter.  Blood pressure today was 130/68.   patient schedule for an appointment 09/30/16.  daughter wanted to know if may increase diuretic Daughter patient very concerned about breathing pattern.

## 2016-09-23 NOTE — Telephone Encounter (Signed)
Spoke with daughter and per Dr Oval Linsey ok for patient to continue 30 mg of Furosemide until appointment next week Moved appointment up to 09/27/16, daughter aware of date and time

## 2016-09-23 NOTE — Telephone Encounter (Signed)
New Message  Pt c/o medication issue:  1. Name of Medication: Lasix   2. How are you currently taking this medication (dosage and times per day)? 20mg    3. Are you having a reaction (difficulty breathing--STAT)? Per pt daughter, winded, heavy breathing some SOB  4. What is your medication issue? Per pt daughter would like to speak with RN to see if pt would need to increase dosage. Please call back to discuss

## 2016-09-23 NOTE — Addendum Note (Signed)
Addended by: Alvina Filbert B on: 09/23/2016 05:28 PM   Modules accepted: Orders

## 2016-09-26 ENCOUNTER — Telehealth: Payer: Self-pay | Admitting: Cardiovascular Disease

## 2016-09-26 NOTE — Telephone Encounter (Signed)
New Message     Pt daughter has question on how you want her to give the pt Lasix , you do want her to split it up or give it to her all at one time

## 2016-09-26 NOTE — Progress Notes (Signed)
Cardiology Office Note   Date:  09/28/2016   ID:  [name], DOB 05-14-1929, MRN [id number]  PCP:  Haywood Pao, MD  Cardiologist:   Skeet Latch, MD   Chief Complaint  Patient presents with  . Follow-up    POST HOSPITAL      History of Present Illness: Anna [name] is a 83 y.o. female with chronic systolic and diastolic heart failure, persistent atrial fibrillation, prior GI bleed, hypertensive heart disease, diabetes, and CKD III who presents for follow up.  [name] was hospitalized 08/2016 with atrial fibrillation with RVR and volume overloaded.  During that hospitalization she had an echocardiogram that revealed LVEF 35-40% with diffuse hypokinesis.  This was reduced from her echo 08/2015 that revealed LVEF 45-50%.  Prior to admission she was treated as an outpatient for pneumonia.  Her metoprolol was increased to 12.5mg  bid with improved rate control.  She was mildly volume overloaded and diuresed with IV lasix.  Prior to her hospitalization [name] had been taken off metoprolol due to hypotension and weakness.    Since her hospitalization Ms. Dirkse' daughter noted increased shortness of breath so she increased lasix to 30 mg daily.  When she initially returned from the hospital her breathing was excellent and she had no shortness of breath. However she gradually started to appear short of breath when she stood up from her chair. Her daughter increased the Lasix to 30 mg daily and has noted improvement in her breathing. She hasn't noted any orthopnea or lower extremity edema.  Her daughter notes that she continues to have difficulty swallowing and sometimes chokes when she eats.   Past Medical History:  Diagnosis Date  . CHF (congestive heart failure) ([place])   . Chronic renal insufficiency, stage III (moderate)   . Dementia   . Diabetes mellitus without complication ([place])   . GI bleeding 11/2013   due to supratherapeutic INR  . Hyperlipidemia   .  Hypertension   . Hyperthyroidism    on amiodarone  . LBBB (left bundle branch block)   . Overactive bladder   . Overweight   . Persistent atrial fibrillation ([place])   . Vitamin B12 deficiency   . Vitamin D deficiency     Past Surgical History:  Procedure Laterality Date  . ABDOMINAL HYSTERECTOMY       Current Outpatient Prescriptions  Medication Sig Dispense Refill  . ACCU-CHEK AVIVA PLUS test strip PATIENT TO CHECK SUGARS ONCE DAILY.  3  . acetaminophen (TYLENOL) 500 MG tablet Take 500 mg by mouth every 6 (six) hours as needed for fever.     Marland Kitchen aspirin 325 MG tablet Take 325 mg by mouth daily.    . calcitRIOL (ROCALTROL) 0.5 MCG capsule Take 0.5 mcg by mouth 3 (three) times a week. Monday, Wednesday, and Friday only    . carbamide peroxide (DEBROX) 6.5 % otic solution Place 5 drops into both ears 2 (two) times daily. (Patient taking differently: Place 5 drops into both ears daily as needed. ) 15 mL 0  . docusate sodium (COLACE) 100 MG capsule Take 100 mg by mouth daily as needed for mild constipation.    Marland Kitchen donepezil (ARICEPT) 10 MG tablet Take 10 mg by mouth at bedtime.    . fluticasone (FLONASE) 50 MCG/ACT nasal spray Place 2 sprays into both nostrils daily.    . furosemide (LASIX) 20 MG tablet Take 30 mg by mouth as directed. 1 and 1/2 tablets daily     .  glimepiride (AMARYL) 2 MG tablet Take 2 mg by mouth daily with breakfast.    . guaiFENesin (MUCINEX) 600 MG 12 hr tablet Take 600 mg by mouth 2 (two) times daily as needed for cough. Reported on 12/22/2015    . loratadine (ALAVERT) 10 MG tablet Take 10 mg by mouth daily as needed for allergies. Every 2 days     . metoprolol tartrate (LOPRESSOR) 25 MG tablet Take 1 tablet (25 mg total) by mouth 2 (two) times daily. 60 tablet 2  . Polyethyl Glycol-Propyl Glycol (SYSTANE) 0.4-0.3 % SOLN Apply 1 drop to eye 2 (two) times daily as needed (dry eyes).    . pravastatin (PRAVACHOL) 40 MG tablet Take 40 mg by mouth daily.    . vitamin B-12  (CYANOCOBALAMIN) 500 MCG tablet Take 500 mcg by mouth daily.    . Vitamin D, Ergocalciferol, (DRISDOL) 50000 UNITS CAPS capsule Take 50,000 Units by mouth once a week. Mondays     No current facility-administered medications for this visit.     Allergies:   Pollen extract    Social History:  The patient  reports that she has never smoked. She has never used smokeless tobacco. She reports that she does not drink alcohol or use drugs.   Family History:  The patient's family history includes Diabetes in her mother; Heart disease in her father.    ROS:  Please see the history of present illness.   Otherwise, review of systems are positive for none.   All other systems are reviewed and negative.    PHYSICAL EXAM: VS:  BP 128/69   Pulse 74   Ht 5\' 5"  (1.651 m)   Wt 91.2 kg (201 lb)   BMI 33.45 kg/m  , BMI Body mass index is 33.45 kg/m. GENERAL:  Well appearing HEENT:  Pupils equal round and reactive, fundi not visualized, oral mucosa unremarkable NECK:  No jugular venous distention, waveform within normal limits, carotid upstroke brisk and symmetric, no bruits, no thyromegaly LYMPHATICS:  No cervical adenopathy LUNGS:  Clear to auscultation bilaterally HEART:  Irregularly irregular.  PMI not displaced or sustained,S1 and S2 within normal limits, no S3, no S4, no clicks, no rubs, no murmurs ABD:  Flat, positive bowel sounds normal in frequency in pitch, no bruits, no rebound, no guarding, no midline pulsatile mass, no hepatomegaly, no splenomegaly EXT:  2 plus pulses throughout, no edema, no cyanosis no clubbing SKIN:  No rashes no nodules NEURO:  Cranial nerves II through XII grossly intact, motor grossly intact throughout PSYCH:  Cognitively intact, oriented to person place and time   EKG:  EKG is not ordered today.   Echo 09/16/16: Study Conclusions  - Left ventricle: The cavity size was normal. Wall thickness was   normal. Systolic function was moderately reduced. The  estimated   ejection fraction was in the range of 35% to 40%. Moderate   diffuse hypokinesis with no identifiable regional variations.   Acoustic contrast opacification revealed no evidence ofthrombus. - Ventricular septum: Septal motion showed abnormal function,   dyssynergy, and paradox. These changes are consistent with a left   bundle branch block. - Left atrium: The atrium was mildly dilated.  Recent Labs: 02/05/2016: ALT 11 09/14/2016: B Natriuretic Peptide 188.2 09/15/2016: Magnesium 2.0; TSH 0.738 09/16/2016: Hemoglobin 10.0; Platelets 240 09/27/2016: BUN 21; Creat 1.25; Potassium 3.8; Sodium 140    Lipid Panel No results found for: CHOL, TRIG, HDL, CHOLHDL, VLDL, LDLCALC, LDLDIRECT    Wt Readings from Last 3  Encounters:  09/27/16 91.2 kg (201 lb)  09/15/16 91.5 kg (201 lb 11.5 oz)  07/18/16 91.9 kg (202 lb 9.6 oz)      ASSESSMENT AND PLAN:  # Persistent atrial fibrillation: Ms. Rueter is not on anticoagulation due to history of GI bleed and fall risk. Her heart rates are much better-controlled. Continue metoprolol and aspirin .   # Chronic systolic and diastolic heart failure: Ms. Drewniak is currently euvolemic. Continue Lasix 30 mg daily and metoprolol. Blood pressure is well-controlled. LVEF 35-40% on her most recent echocardiogram. She is not on an ACE inhibitor due to low blood pressure. She is also had renal dysfunction. If her blood pressure increases in her renal function remained stable could consider trying a low dose of an ACE inhibitor in the future. We will check a basic metabolic panel today.   # CKD III: Check BMP.  # Hyperlipidemia:  Continue pravastatin.   Current medicines are reviewed at length with the patient today.  The patient does not have concerns regarding medicines.  The following changes have been made:  Increase lasix to 30 mg daily.  Labs/ tests ordered today include:   Orders Placed This Encounter  Procedures  . Basic metabolic panel      Disposition:   FU with Jahkai Yandell C. Oval Linsey, MD, [facility] in 2 month    This note was written with the assistance of speech recognition software.  Please excuse any transcriptional errors.  Signed, Tiffanye Hartmann C. Oval Linsey, MD, [facility]  09/28/2016 9:27 AM    [place]

## 2016-09-26 NOTE — Telephone Encounter (Signed)
Returned call-daughter is wondering if patient should take 30mg  (1.5 tablet) Lasix at one time or split it up into separate times.  Advised it would be okay to give 30mg  at one time.  Daughter states that patients breathing is improving and she was able to stand and get up without significant SOB.  Reports minor edema but is improving.  Appt is tomorrow 2/6 with Dr. Oval Linsey at 10:45 at [facility] office.  Daughter verbalized understanding.

## 2016-09-27 ENCOUNTER — Encounter: Payer: Self-pay | Admitting: Cardiovascular Disease

## 2016-09-27 ENCOUNTER — Ambulatory Visit (INDEPENDENT_AMBULATORY_CARE_PROVIDER_SITE_OTHER): Payer: Medicare HMO | Admitting: Cardiovascular Disease

## 2016-09-27 VITALS — BP 128/69 | HR 74 | Ht 65.0 in | Wt 201.0 lb

## 2016-09-27 DIAGNOSIS — G3109 Other frontotemporal dementia: Secondary | ICD-10-CM

## 2016-09-27 DIAGNOSIS — Z5181 Encounter for therapeutic drug level monitoring: Secondary | ICD-10-CM

## 2016-09-27 DIAGNOSIS — E7800 Pure hypercholesterolemia, unspecified: Secondary | ICD-10-CM | POA: Diagnosis not present

## 2016-09-27 DIAGNOSIS — F0280 Dementia in other diseases classified elsewhere without behavioral disturbance: Secondary | ICD-10-CM

## 2016-09-27 DIAGNOSIS — I481 Persistent atrial fibrillation: Secondary | ICD-10-CM

## 2016-09-27 DIAGNOSIS — I4819 Other persistent atrial fibrillation: Secondary | ICD-10-CM

## 2016-09-27 DIAGNOSIS — I5042 Chronic combined systolic (congestive) and diastolic (congestive) heart failure: Secondary | ICD-10-CM

## 2016-09-27 DIAGNOSIS — N390 Urinary tract infection, site not specified: Secondary | ICD-10-CM | POA: Diagnosis not present

## 2016-09-27 DIAGNOSIS — R8299 Other abnormal findings in urine: Secondary | ICD-10-CM | POA: Diagnosis not present

## 2016-09-27 LAB — BASIC METABOLIC PANEL
BUN: 21 mg/dL (ref 7–25)
CALCIUM: 9 mg/dL (ref 8.6–10.4)
CHLORIDE: 106 mmol/L (ref 98–110)
CO2: 23 mmol/L (ref 20–31)
CREATININE: 1.25 mg/dL — AB (ref 0.60–0.88)
Glucose, Bld: 163 mg/dL — ABNORMAL HIGH (ref 65–99)
Potassium: 3.8 mmol/L (ref 3.5–5.3)
Sodium: 140 mmol/L (ref 135–146)

## 2016-09-27 NOTE — Patient Instructions (Signed)
Medication Instructions:  CONTINUE THE FUROSEMIDE 30 MG DAILY  Labwork: BMET AT [facility] ON THE FIRST FLOOR TODAY  Testing/Procedures: NONE  Follow-Up: Your physician recommends that you schedule a follow-up appointment in: 2 MONTH OV  If you need a refill on your cardiac medications before your next appointment, please call your pharmacy.

## 2016-09-28 ENCOUNTER — Telehealth: Payer: Self-pay | Admitting: *Deleted

## 2016-09-28 NOTE — Telephone Encounter (Signed)
-----   Message from Skeet Latch, MD sent at 09/28/2016  8:36 AM EST ----- Kidney function appears stable. Continue Lasix 30 mg daily. Potassium is within normal limits and no adjustments needed. Her glucose does continue to be elevated. Follow-up with PCP as discussed.

## 2016-09-28 NOTE — Telephone Encounter (Signed)
Advised daughter of labs She did want Dr Oval Linsey to know she has a UTI and was given Keflex TID for 5 days, will forward so she will be aware

## 2016-09-30 ENCOUNTER — Ambulatory Visit: Payer: Commercial Managed Care - HMO | Admitting: Cardiovascular Disease

## 2016-09-30 ENCOUNTER — Telehealth: Payer: Self-pay | Admitting: Cardiovascular Disease

## 2016-09-30 DIAGNOSIS — R05 Cough: Secondary | ICD-10-CM | POA: Diagnosis not present

## 2016-09-30 DIAGNOSIS — J189 Pneumonia, unspecified organism: Secondary | ICD-10-CM | POA: Diagnosis not present

## 2016-09-30 DIAGNOSIS — R5383 Other fatigue: Secondary | ICD-10-CM | POA: Diagnosis not present

## 2016-09-30 NOTE — Telephone Encounter (Signed)
New message       Daughter states that pt is "wheezy" retaining fluid.  Should she get a chest xray?

## 2016-09-30 NOTE — Telephone Encounter (Signed)
Pt of Dr. Oval Linsey Seen Tuesday  Spoke to daughter. Patient "congested again", lays down and is coughing, congested. "O2 drops to about 90% when standing". "Doesn't feel good." Does "OK" when sitting, a little labored breathing. When she stands has a hard time breathing, has a hard time speaking. No obvious swelling in legs or abdomen. Can't weigh patient because she can't stand long enough. Seems to be worse since Tuesday. Denies runny nose, fever, cough, etc.  "So my question is, do we bump her up (lasix)". Taking 30mg  daily currently - Used to be on 40mg  - OK to increase? Daughter notes she'd been the one to advocate cutting the dose to begin with.  She'd called Dr. Loren Racer office out of concern that this might be pneumonia and to see if CXR could be done. Has appt today at 1:30p w Dr. Loren Racer PA.  Notes patient not on potassium, used to take 1 pill (7meq) should she take this if she takes extra lasix (has "brand new bottle" on-hand).   Advised daughter might be best to double lasix for a couple days, see if improvement in symptoms, possible to consider baseline increase on lasix but would need Dr. Oval Linsey to give guidance on all of this - pt Ok on assessment Tuesday.

## 2016-09-30 NOTE — Telephone Encounter (Signed)
Spoke with daughter and mother does have pneumonia  Started on Levaquin  Requested records from [employer]

## 2016-09-30 NOTE — Telephone Encounter (Signed)
Please wait until she is seen by Dr. Osborne Casco.  Her volume status was fine when seen earlier this week.  If he doesn't see pneumonia or other cause for her shortness of breath, OK to increase lasix to 40mg .  Repeat BMP next Tues-Wed if lasix is increased.  Her potassium was fine on the BMP.  We will advise if it needs to be increased based on her next BMP.

## 2016-10-03 ENCOUNTER — Emergency Department (HOSPITAL_COMMUNITY): Payer: Medicare HMO

## 2016-10-03 ENCOUNTER — Telehealth: Payer: Self-pay | Admitting: Cardiovascular Disease

## 2016-10-03 ENCOUNTER — Encounter (HOSPITAL_COMMUNITY): Payer: Self-pay | Admitting: Emergency Medicine

## 2016-10-03 ENCOUNTER — Emergency Department (HOSPITAL_COMMUNITY)
Admission: EM | Admit: 2016-10-03 | Discharge: 2016-10-03 | Disposition: A | Discharge status: Home / Self Care | Payer: Medicare HMO | Attending: Emergency Medicine | Admitting: Emergency Medicine

## 2016-10-03 DIAGNOSIS — Z7982 Long term (current) use of aspirin: Secondary | ICD-10-CM | POA: Diagnosis not present

## 2016-10-03 DIAGNOSIS — E1122 Type 2 diabetes mellitus with diabetic chronic kidney disease: Secondary | ICD-10-CM | POA: Insufficient documentation

## 2016-10-03 DIAGNOSIS — R05 Cough: Secondary | ICD-10-CM | POA: Diagnosis not present

## 2016-10-03 DIAGNOSIS — N183 Chronic kidney disease, stage 3 (moderate): Secondary | ICD-10-CM | POA: Insufficient documentation

## 2016-10-03 DIAGNOSIS — I130 Hypertensive heart and chronic kidney disease with heart failure and stage 1 through stage 4 chronic kidney disease, or unspecified chronic kidney disease: Secondary | ICD-10-CM | POA: Insufficient documentation

## 2016-10-03 DIAGNOSIS — I5041 Acute combined systolic (congestive) and diastolic (congestive) heart failure: Secondary | ICD-10-CM | POA: Insufficient documentation

## 2016-10-03 DIAGNOSIS — R0602 Shortness of breath: Secondary | ICD-10-CM | POA: Diagnosis not present

## 2016-10-03 DIAGNOSIS — R6889 Other general symptoms and signs: Secondary | ICD-10-CM

## 2016-10-03 DIAGNOSIS — J111 Influenza due to unidentified influenza virus with other respiratory manifestations: Secondary | ICD-10-CM | POA: Diagnosis not present

## 2016-10-03 LAB — I-STAT CHEM 8, ED
BUN: 20 mg/dL (ref 6–20)
Calcium, Ion: 1.13 mmol/L — ABNORMAL LOW (ref 1.15–1.40)
Chloride: 106 mmol/L (ref 101–111)
Creatinine, Ser: 1.4 mg/dL — ABNORMAL HIGH (ref 0.44–1.00)
Glucose, Bld: 116 mg/dL — ABNORMAL HIGH (ref 65–99)
HCT: 31 % — ABNORMAL LOW (ref 36.0–46.0)
Hemoglobin: 10.5 g/dL — ABNORMAL LOW (ref 12.0–15.0)
Potassium: 3.6 mmol/L (ref 3.5–5.1)
Sodium: 142 mmol/L (ref 135–145)
TCO2: 25 mmol/L (ref 0–100)

## 2016-10-03 LAB — CBC WITH DIFFERENTIAL/PLATELET
Basophils Absolute: 0 10*3/uL (ref 0.0–0.1)
Basophils Relative: 0 %
Eosinophils Absolute: 0.1 10*3/uL (ref 0.0–0.7)
Eosinophils Relative: 3 %
HCT: 31.9 % — ABNORMAL LOW (ref 36.0–46.0)
Hemoglobin: 9.1 g/dL — ABNORMAL LOW (ref 12.0–15.0)
Lymphocytes Relative: 27 %
Lymphs Abs: 1.2 10*3/uL (ref 0.7–4.0)
MCH: 21.6 pg — ABNORMAL LOW (ref 26.0–34.0)
MCHC: 28.5 g/dL — ABNORMAL LOW (ref 30.0–36.0)
MCV: 75.8 fL — ABNORMAL LOW (ref 78.0–100.0)
Monocytes Absolute: 0.6 10*3/uL (ref 0.1–1.0)
Monocytes Relative: 13 %
Neutro Abs: 2.5 10*3/uL (ref 1.7–7.7)
Neutrophils Relative %: 57 %
Platelets: 191 10*3/uL (ref 150–400)
RBC: 4.21 MIL/uL (ref 3.87–5.11)
RDW: 16.8 % — ABNORMAL HIGH (ref 11.5–15.5)
WBC: 4.5 10*3/uL (ref 4.0–10.5)

## 2016-10-03 LAB — I-STAT CG4 LACTIC ACID, ED: Lactic Acid, Venous: 0.99 mmol/L (ref 0.5–1.9)

## 2016-10-03 MED ORDER — BENZONATATE 100 MG PO CAPS
200.0000 mg | ORAL_CAPSULE | Freq: Once | ORAL | Status: AC
  Administered 2016-10-03: 200 mg via ORAL
  Filled 2016-10-03: qty 2

## 2016-10-03 MED ORDER — RIVAROXABAN 20 MG PO TABS: 20.0000 mg | ORAL_TABLET | Freq: Once | ORAL | Status: DC

## 2016-10-03 MED ORDER — BENZONATATE 100 MG PO CAPS: 100.0000 mg | ORAL_CAPSULE | Freq: Three times a day (TID) | ORAL | 0 refills | Status: DC

## 2016-10-03 MED ORDER — ALBUTEROL SULFATE HFA 108 (90 BASE) MCG/ACT IN AERS: 2.0000 | INHALATION_SPRAY | Freq: Four times a day (QID) | RESPIRATORY_TRACT | 2 refills | Status: DC | PRN

## 2016-10-03 NOTE — ED Provider Notes (Signed)
[facility] DEPT Provider Note   CSN: [id number] Arrival date & time: 10/03/16  1205     History   Chief Complaint Chief Complaint  Patient presents with  . Pneumonia    HPI Anna [name] is a 83 y.o. female.  HPI Pt comes in with cc of weakness, cough, pneumonia. Pt here with his daughter, who is the primary care provider. Daughter reports that pt has had some URI like symptoms for the last several days. Pt was seen on Friday by pcp and started on levaquin, which would help both a possible urinary infection and pneumonia. Pt was also admitted for pneumonia back in Jan. Pt has a cough, it's productive, but pt is not spitting it out. Pt also has weakness and reduced po intake. Pt has hx of CHF, and the daughter has held the lasix because of reduced po intake. Pt denies emesis, fevers, chills, chest pains, shortness of breath, headaches, abdominal pain, uti like symptoms.   Past Medical History:  Diagnosis Date  . CHF (congestive heart failure) ([place])   . Chronic renal insufficiency, stage III (moderate)   . Dementia   . Diabetes mellitus without complication ([place])   . GI bleeding 11/2013   due to supratherapeutic INR  . Hyperlipidemia   . Hypertension   . Hyperthyroidism    on amiodarone  . LBBB (left bundle branch block)   . Overactive bladder   . Overweight   . Persistent atrial fibrillation ([name])   . Vitamin B12 deficiency   . Vitamin D deficiency     Patient Active Problem List   Diagnosis Date Noted  . Acute on chronic diastolic heart failure ([place])   . Atrial fibrillation ([place]) 09/15/2016  . Atrial fibrillation with RVR ([place]) 09/15/2016  . Near syncope 02/05/2016  . Pre-syncope 02/05/2016  . Chest pain   . Diabetes mellitus with complication ([place])   . Hypokalemia 08/28/2015  . UTI (lower urinary tract infection) 08/26/2015  . Generalized weakness 08/26/2015  . Acute combined systolic and diastolic heart failure ([place]) 02/16/2015  . Hyperthyroidism  01/28/2015  . Dyspnea 01/27/2015  . SOB (shortness of breath) 01/27/2015  . Failure to thrive in adult 01/27/2015  . Benign essential HTN 01/27/2015  . DM (diabetes mellitus) type II controlled with renal manifestation ([place]) 01/27/2015  . CKD (chronic kidney disease), stage III 01/27/2015    Past Surgical History:  Procedure Laterality Date  . ABDOMINAL HYSTERECTOMY      OB History    No data available       Home Medications    Prior to Admission medications   Medication Sig Start Date End Date Taking? Authorizing Provider  ACCU-CHEK AVIVA PLUS test strip PATIENT TO CHECK SUGARS ONCE DAILY. 11/19/15   Historical Provider, MD  acetaminophen (TYLENOL) 500 MG tablet Take 500 mg by mouth every 6 (six) hours as needed for fever.     Historical Provider, MD  albuterol (PROVENTIL HFA;VENTOLIN HFA) 108 (90 Base) MCG/ACT inhaler Inhale 2 puffs into the lungs every 6 (six) hours as needed for wheezing or shortness of breath. 10/03/16   Varney Biles, MD  aspirin 325 MG tablet Take 325 mg by mouth daily.    Historical Provider, MD  benzonatate (TESSALON) 100 MG capsule Take 1 capsule (100 mg total) by mouth every 8 (eight) hours. 10/03/16   Varney Biles, MD  calcitRIOL (ROCALTROL) 0.5 MCG capsule Take 0.5 mcg by mouth 3 (three) times a week. Monday, Wednesday, and Friday only 01/07/15  Historical Provider, MD  carbamide peroxide (DEBROX) 6.5 % otic solution Place 5 drops into both ears 2 (two) times daily. Patient taking differently: Place 5 drops into both ears daily as needed.  02/09/16   Geradine Girt, DO  docusate sodium (COLACE) 100 MG capsule Take 100 mg by mouth daily as needed for mild constipation.    Historical Provider, MD  donepezil (ARICEPT) 10 MG tablet Take 10 mg by mouth at bedtime.    Historical Provider, MD  fluticasone (FLONASE) 50 MCG/ACT nasal spray Place 2 sprays into both nostrils daily.    Historical Provider, MD  furosemide (LASIX) 20 MG tablet Take 30 mg by mouth as  directed. 1 and 1/2 tablets daily     Historical Provider, MD  glimepiride (AMARYL) 2 MG tablet Take 2 mg by mouth daily with breakfast.    Historical Provider, MD  guaiFENesin (MUCINEX) 600 MG 12 hr tablet Take 600 mg by mouth 2 (two) times daily as needed for cough. Reported on 12/22/2015    Historical Provider, MD  loratadine (ALAVERT) 10 MG tablet Take 10 mg by mouth daily as needed for allergies. Every 2 days     Historical Provider, MD  metoprolol tartrate (LOPRESSOR) 25 MG tablet Take 1 tablet (25 mg total) by mouth 2 (two) times daily. 09/17/16   Sid Falcon, MD  Polyethyl Glycol-Propyl Glycol (SYSTANE) 0.4-0.3 % SOLN Apply 1 drop to eye 2 (two) times daily as needed (dry eyes).    Historical Provider, MD  pravastatin (PRAVACHOL) 40 MG tablet Take 40 mg by mouth daily.    Historical Provider, MD  vitamin B-12 (CYANOCOBALAMIN) 500 MCG tablet Take 500 mcg by mouth daily.    Historical Provider, MD  Vitamin D, Ergocalciferol, (DRISDOL) 50000 UNITS CAPS capsule Take 50,000 Units by mouth once a week. Mondays 01/07/15   Historical Provider, MD    Family History Family History  Problem Relation Age of Onset  . Heart disease Father   . Diabetes Mother   . Thyroid disease Neg Hx     Social History Social History  Substance Use Topics  . Smoking status: Never Smoker  . Smokeless tobacco: Never Used  . Alcohol use No     Allergies   Pollen extract   Review of Systems Review of Systems  ROS 10 Systems reviewed and are negative for acute change except as noted in the HPI.     Physical Exam Updated Vital Signs BP 148/70 (BP Location: Right Arm)   Pulse 84   Temp 98.5 F (36.9 C) (Oral)   Resp 24   Ht 5\' 5"  (1.651 m)   Wt 201 lb (91.2 kg)   SpO2 94%   BMI 33.45 kg/m   Physical Exam  Constitutional: She is oriented to person, place, and time. She appears well-developed and well-nourished.  HENT:  Head: Normocephalic and atraumatic.  Eyes: EOM are normal. Pupils are  equal, round, and reactive to light.  Neck: Neck supple.  Cardiovascular: Normal rate, regular rhythm and normal heart sounds.   Pulmonary/Chest: Effort normal. No respiratory distress.  Abdominal: Soft. She exhibits no distension. There is no tenderness. There is no rebound and no guarding.  Neurological: She is alert and oriented to person, place, and time.  Skin: Skin is warm and dry.  Nursing note and vitals reviewed.    ED Treatments / Results  Labs (all labs ordered are listed, but only abnormal results are displayed) Labs Reviewed  CBC WITH DIFFERENTIAL/PLATELET - Abnormal;  Notable for the following:       Result Value   Hemoglobin 9.1 (*)    HCT 31.9 (*)    MCV 75.8 (*)    MCH 21.6 (*)    MCHC 28.5 (*)    RDW 16.8 (*)    All other components within normal limits  I-STAT CHEM 8, ED - Abnormal; Notable for the following:    Creatinine, Ser 1.40 (*)    Glucose, Bld 116 (*)    Calcium, Ion 1.13 (*)    Hemoglobin 10.5 (*)    HCT 31.0 (*)    All other components within normal limits  I-STAT CG4 LACTIC ACID, ED    EKG  EKG Interpretation  Date/Time:  Monday October 03 2016 12:21:13 EST Ventricular Rate:  71 PR Interval:    QRS Duration: 168 QT Interval:  460 QTC Calculation: 500 R Axis:   -5 Text Interpretation:  Atrial fibrillation Left bundle branch block No acute changes No significant change since last tracing Confirmed by Kathrynn Humble, MD, [facility] [phone]) on 10/03/2016 2:31:41 PM       Radiology Dg Chest 2 View  Result Date: 10/03/2016 CLINICAL DATA:  Recent pneumonia.  Shortness of breath. EXAM: CHEST  2 VIEW COMPARISON:  September 17, 2016 FINDINGS: There is atelectatic change in left base. Lungs elsewhere clear. Heart is slightly enlarged with pulmonary vascularity within normal limits. There is atherosclerotic calcification in the aorta. No adenopathy. No bone lesions. IMPRESSION: Atelectatic change left base. Early pneumonia left base cannot be entirely  excluded. Lungs elsewhere clear. Stable mild cardiac enlargement. There is aortic atherosclerosis. Electronically Signed   By: Lowella Grip III M.D.   On: 10/03/2016 12:43    Procedures Procedures (including critical care time)  Medications Ordered in ED Medications  benzonatate (TESSALON) capsule 200 mg (200 mg Oral Given 10/03/16 1608)     Initial Impression / Assessment and Plan / ED Course  I have reviewed the triage vital signs and the nursing notes.  Pertinent labs & imaging results that were available during my care of the patient were reviewed by me and considered in my medical decision making (see chart for details).     Pt comes in with cough, weakness. She has been sick since before Friday. She was seen by pcp, rapid flu in the clinic was normal, so were all the labs. Pt was started on levaquin. She also had UTI.  I spoke with Dr. Rosana Hoes. He reports that pt likely has flu like illness. He ha san appt to see her this week.  Pt has no new falls, and her symptoms are all upper resp. She is well outside any treatment window with tamiflu, and not sick enough to be admitted based on exam and hx.  Basic labs ordered to ensure there is no profound dehydration that could have led to e'lyte abnormality or AKI.  Strict ER return precautions have been discussed, and patient is agreeing with the plan and is comfortable with the workup done and the recommendations from the ER.   Final Clinical Impressions(s) / ED Diagnoses   Final diagnoses:  Flu-like symptoms    New Prescriptions Discharge Medication List as of 10/03/2016  4:00 PM    START taking these medications   Details  albuterol (PROVENTIL HFA;VENTOLIN HFA) 108 (90 Base) MCG/ACT inhaler Inhale 2 puffs into the lungs every 6 (six) hours as needed for wheezing or shortness of breath., Starting Mon 10/03/2016, Print    benzonatate (TESSALON) 100 MG capsule  Take 1 capsule (100 mg total) by mouth every 8 (eight) hours.,  Starting Mon 10/03/2016, Print         Varney Biles, MD 10/03/16 1630

## 2016-10-03 NOTE — Discharge Instructions (Signed)
Please return to the ER if your symptoms worsen; you have increased pain, fevers, chills, inability to keep any medications down, confusion. Otherwise see the outpatient doctor as requested.  

## 2016-10-03 NOTE — Telephone Encounter (Signed)
Please call,pt has pneumonia and she needs to ask you something. She says she is thinking about taking her to the hospital.She said she only wanted to talk with you.

## 2016-10-03 NOTE — ED Notes (Signed)
Bed: WA01 Expected date:  Expected time:  Means of arrival:  Comments: EMS 80's, confusion

## 2016-10-03 NOTE — Telephone Encounter (Signed)
Spoke with daughter and she stated that patients breathing and congestion has gotten worse since her visit with PCP Friday. Patient was told by PCP if worse to go to ED, daughter is taking her mother as recommended  Dr Oval Linsey made aware

## 2016-10-03 NOTE — ED Notes (Signed)
Discharge instructions, follow up care, and rx x2 reviewed with patient. Patient verbalized understanding. 

## 2016-10-03 NOTE — ED Triage Notes (Addendum)
Per EMS, patient dx with pneumonia and on day 4 on levaquin. Over the last 2 days, patient has had increased fatigue, shortness of breath with exertion, and congestion. Patient is from home.

## 2016-10-04 ENCOUNTER — Telehealth: Payer: Self-pay | Admitting: Cardiovascular Disease

## 2016-10-04 NOTE — Telephone Encounter (Signed)
New Message   Per daughter: Pt was treated in Er for pneumonia, pt is not drinking water she had less than 20oz yesterday and has not had 20oz today, should she take the Lasik?

## 2016-10-04 NOTE — Telephone Encounter (Signed)
Pt daughter notified she will hold today and "try again tomorrow"

## 2016-10-04 NOTE — Telephone Encounter (Signed)
Ok to hold per Dr Oval Linsey

## 2016-10-04 NOTE — Telephone Encounter (Signed)
Spoke with daughter she states that ER (with pneumonia) told her to hold lasix yesterday and give it to her today. She has only had scant amount of urine this morning and none in her pad overnight. Only drank 20oz yesterday and barely about just barely finished her 20 oz today. She had a fever last night 100.4 and this morning 99.3 she gave her tyl and doesn't think she has fever now. Pt is taking all medications as directed along with mucinex and 2 doses left of levaquin.  She has not given her the lasix today. Should she  Give the lasix or should she hold? Daughter states that she would like to hold again today because she has decreased po intake.   Pt daughter denies emesis, chills, chest pains, shortness of breath, headaches, abdominal pain, uti like symptoms.

## 2016-10-06 DIAGNOSIS — J189 Pneumonia, unspecified organism: Secondary | ICD-10-CM | POA: Diagnosis not present

## 2016-10-13 ENCOUNTER — Telehealth: Payer: Self-pay | Admitting: Cardiovascular Disease

## 2016-10-13 NOTE — Telephone Encounter (Signed)
Records received from [facility] for apt on 12/05/16 with Dr Oval Linsey. CN

## 2016-10-14 DIAGNOSIS — N390 Urinary tract infection, site not specified: Secondary | ICD-10-CM | POA: Diagnosis not present

## 2016-10-15 ENCOUNTER — Telehealth: Payer: Self-pay | Admitting: Cardiology

## 2016-10-15 NOTE — Telephone Encounter (Signed)
Pt's daughter called and about some swelling, I called but no answer and unable to leave a message.  I returned the call some time after paged due work in the hospital.

## 2016-10-21 DIAGNOSIS — N390 Urinary tract infection, site not specified: Secondary | ICD-10-CM | POA: Diagnosis not present

## 2016-10-21 DIAGNOSIS — D509 Iron deficiency anemia, unspecified: Secondary | ICD-10-CM | POA: Diagnosis not present

## 2016-10-21 DIAGNOSIS — J309 Allergic rhinitis, unspecified: Secondary | ICD-10-CM | POA: Diagnosis not present

## 2016-10-21 DIAGNOSIS — R5383 Other fatigue: Secondary | ICD-10-CM | POA: Diagnosis not present

## 2016-10-21 DIAGNOSIS — R05 Cough: Secondary | ICD-10-CM | POA: Diagnosis not present

## 2016-10-21 DIAGNOSIS — I5020 Unspecified systolic (congestive) heart failure: Secondary | ICD-10-CM | POA: Diagnosis not present

## 2016-10-21 DIAGNOSIS — R8290 Unspecified abnormal findings in urine: Secondary | ICD-10-CM | POA: Diagnosis not present

## 2016-11-23 NOTE — Progress Notes (Deleted)
   11/23/16 1500  SLP G-Codes **NOT FOR INPATIENT CLASS**  Functional Assessment Tool Used (clinical judgement)  Functional Limitations Swallowing  Swallow Current Status (S2583) CK  Swallow Goal Status (M6219) CI

## 2016-11-23 NOTE — Progress Notes (Signed)
   09/16/16 1400  SLP G-Codes **NOT FOR INPATIENT CLASS**  Functional Assessment Tool Used clinical judgement  Functional Limitations Swallowing  Swallow Current Status (J5009) CK  Swallow Goal Status (F8182) CI  SLP Evaluations  $ SLP Speech Visit 1 Procedure  SLP Evaluations  $Swallowing Treatment 1 Procedure

## 2016-11-25 DIAGNOSIS — R300 Dysuria: Secondary | ICD-10-CM | POA: Diagnosis not present

## 2016-11-25 DIAGNOSIS — J301 Allergic rhinitis due to pollen: Secondary | ICD-10-CM | POA: Diagnosis not present

## 2016-11-25 DIAGNOSIS — I130 Hypertensive heart and chronic kidney disease with heart failure and stage 1 through stage 4 chronic kidney disease, or unspecified chronic kidney disease: Secondary | ICD-10-CM | POA: Diagnosis not present

## 2016-11-25 DIAGNOSIS — R05 Cough: Secondary | ICD-10-CM | POA: Diagnosis not present

## 2016-11-28 ENCOUNTER — Telehealth: Payer: Self-pay | Admitting: Student

## 2016-11-28 NOTE — Telephone Encounter (Signed)
Patient's daughter called reporting her HR has been fluctuating in the 80's - 110's. She has known chronic atrial fibrillation and is on Lopressor 25mg  BID. She denies any recent palpitations, dyspnea, or chest pain. Was seen by PCP earlier this week for increased fatigue and her daughter reports CXR was negative for PNA. No recent fevers or chills.   Reviewed with the patient's daughter that the HR can be variable with her atrial fibrillation. She is due for her evening dose of Lopressor and informed her she could give it now, as HR is currently in the 90's with BP at 146/82. Can give an additional tablet of Lopressor tomorrow if needed for HR sustaining in the 100's and SBP > 110.  Patient's daughter voiced understanding of this and was appreciate of the call.   Signed, Erma Heritage, PA-C 11/28/2016, 7:58 PM

## 2016-12-01 ENCOUNTER — Telehealth: Payer: Self-pay | Admitting: Internal Medicine

## 2016-12-01 NOTE — Telephone Encounter (Signed)
Patients daughter called and would like to know if you would be willing to take her mother on as a patient.  Please advise.

## 2016-12-02 NOTE — Telephone Encounter (Signed)
Very sorry, I am unable to accept at this time 

## 2016-12-05 ENCOUNTER — Ambulatory Visit (INDEPENDENT_AMBULATORY_CARE_PROVIDER_SITE_OTHER): Payer: Medicare HMO | Admitting: Cardiovascular Disease

## 2016-12-05 ENCOUNTER — Encounter: Payer: Self-pay | Admitting: Cardiovascular Disease

## 2016-12-05 VITALS — BP 112/70 | HR 76

## 2016-12-05 DIAGNOSIS — E7800 Pure hypercholesterolemia, unspecified: Secondary | ICD-10-CM | POA: Diagnosis not present

## 2016-12-05 DIAGNOSIS — I4819 Other persistent atrial fibrillation: Secondary | ICD-10-CM

## 2016-12-05 DIAGNOSIS — I5042 Chronic combined systolic (congestive) and diastolic (congestive) heart failure: Secondary | ICD-10-CM

## 2016-12-05 DIAGNOSIS — I481 Persistent atrial fibrillation: Secondary | ICD-10-CM | POA: Diagnosis not present

## 2016-12-05 NOTE — Patient Instructions (Signed)
Medication Instructions:  Your physician recommends that you continue on your current medications as directed. Please refer to the Current Medication list given to you today.  Labwork: none  Testing/Procedures: none  Follow-Up: Your physician wants you to follow-up in: 3 month ov You will receive a reminder letter in the mail two months in advance. If you don't receive a letter, please call our office to schedule the follow-up appointment.  If you need a refill on your cardiac medications before your next appointment, please call your pharmacy.

## 2016-12-05 NOTE — Telephone Encounter (Signed)
Called and LVM for Jolana to call back.  RE. Please inform patient Dr. Jenny Reichmann is not able to take on any patients at this time. And inform of other choices.

## 2016-12-05 NOTE — Progress Notes (Signed)
Cardiology Office Note   Date:  12/05/2016   ID:  Anna [name], DOB [birth year], MRN [id number]  PCP:  Haywood Pao, MD  Cardiologist:   Skeet Latch, MD   No chief complaint on file.     History of Present Illness: Anna [name] is a 83 y.o. female with chronic systolic and diastolic heart failure, persistent atrial fibrillation, prior GI bleed, hypertensive heart disease, diabetes, and CKD III who presents for follow up.  Anna [name] was hospitalized 08/2016 with atrial fibrillation with RVR and volume overloaded.  During that hospitalization she had an echocardiogram that revealed LVEF 35-40% with diffuse hypokinesis.  This was reduced from her echo 08/2015 that revealed LVEF 45-50%.  Prior to admission she was treated as an outpatient for pneumonia.  Her metoprolol was increased to 12.5mg  bid with improved rate control.  She was mildly volume overloaded and diuresed with IV lasix.  Prior to her hospitalization Anna [name] had been taken off metoprolol due to hypotension and weakness.     Since her last appointment Anna [name] has been treated twice for pneumonia.  She was treated with two rounds of antibiotics for both pneumonia and bronchitis.  Until last week she had fevers and her heart rates were elevated to the 110s.  She was taking metoprolol early due to rapid heart rates.  She was also taking furosemide 30 mg instead of 20 mg due to wheezing.  For the last two days she has been doing better.  Her heart rate has normalized.  Her daughter notes that she often coughs when she drinks.  She has been encouraging her to drink between 1200 to 1500 mL daily, but it is sometime difficult to get her to drink that much.  She hasn't noted any lower extremity edema, orthopnea or PND.  Anna [name] remains minimally interactive.  Her daughter thinks that she continues to feel poorly and isn't back to her baseline.  Past Medical History:  Diagnosis Date  . CHF (congestive heart  failure) ([place])   . Chronic renal insufficiency, stage III (moderate)   . Dementia   . Diabetes mellitus without complication ([place])   . GI bleeding 11/2013   due to supratherapeutic INR  . Hyperlipidemia   . Hypertension   . Hyperthyroidism    on amiodarone  . LBBB (left bundle branch block)   . Overactive bladder   . Overweight   . Persistent atrial fibrillation ([place])   . Vitamin B12 deficiency   . Vitamin D deficiency     Past Surgical History:  Procedure Laterality Date  . ABDOMINAL HYSTERECTOMY       Current Outpatient Prescriptions  Medication Sig Dispense Refill  . ACCU-CHEK AVIVA PLUS test strip PATIENT TO CHECK SUGARS ONCE DAILY.  3  . acetaminophen (TYLENOL) 500 MG tablet Take 500 mg by mouth every 6 (six) hours as needed for fever.     Marland Kitchen albuterol (PROVENTIL HFA;VENTOLIN HFA) 108 (90 Base) MCG/ACT inhaler Inhale 2 puffs into the lungs every 6 (six) hours as needed for wheezing or shortness of breath. 1 Inhaler 2  . amoxicillin-clavulanate (AUGMENTIN) 875-125 MG tablet Take 1 tablet by mouth 2 (two) times daily.    Marland Kitchen aspirin 325 MG tablet Take 325 mg by mouth daily.    . calcitRIOL (ROCALTROL) 0.5 MCG capsule Take 0.5 mcg by mouth 3 (three) times a week. Monday, Wednesday, and Friday only    . carbamide peroxide (DEBROX) 6.5 % otic solution Place 5  drops into both ears 2 (two) times daily. (Patient taking differently: Place 5 drops into both ears daily as needed. ) 15 mL 0  . docusate sodium (COLACE) 100 MG capsule Take 100 mg by mouth daily as needed for mild constipation.    Marland Kitchen donepezil (ARICEPT) 10 MG tablet Take 10 mg by mouth at bedtime.    . fluticasone (FLONASE) 50 MCG/ACT nasal spray Place 2 sprays into both nostrils daily.    . furosemide (LASIX) 20 MG tablet Take 20 mg by mouth daily. Take an extra 0.5 tablet daily as needed.    Marland Kitchen glimepiride (AMARYL) 2 MG tablet Take 2 mg by mouth daily with breakfast.    . guaiFENesin (MUCINEX) 600 MG 12 hr tablet Take 600 mg  by mouth 2 (two) times daily as needed for cough. Reported on 12/22/2015    . loratadine (ALAVERT) 10 MG tablet Take 5 mg by mouth daily. Every 2 days     . metoprolol tartrate (LOPRESSOR) 25 MG tablet Take 1 tablet (25 mg total) by mouth 2 (two) times daily. 60 tablet 2  . Polyethyl Glycol-Propyl Glycol (SYSTANE) 0.4-0.3 % SOLN Apply 1 drop to eye 2 (two) times daily as needed (dry eyes).    . pravastatin (PRAVACHOL) 40 MG tablet Take 40 mg by mouth daily.    . vitamin B-12 (CYANOCOBALAMIN) 500 MCG tablet Take 500 mcg by mouth daily.    . Vitamin D, Ergocalciferol, (DRISDOL) 50000 UNITS CAPS capsule Take 50,000 Units by mouth once a week. Mondays     No current facility-administered medications for this visit.     Allergies:   Pollen extract    Social History:  The patient  reports that she has never smoked. She has never used smokeless tobacco. She reports that she does not drink alcohol or use drugs.   Family History:  The patient's family history includes Diabetes in her mother; Heart disease in her father.    ROS:  Unable to obtain ROS due to dementia.    PHYSICAL EXAM: VS:  BP 112/70   Pulse 76  , BMI There is no height or weight on file to calculate BMI.  Patient too weak to get up on scale. GENERAL:  Chronically  Ill-appearing.  No acute distress.  Minimally interactive.  Clearly confused and trying to lean out of chair. HEENT:  Pupils equal round and reactive, fundi not visualized, oral mucosa unremarkable NECK:  No jugular venous distention, waveform within normal limits, carotid upstroke brisk and symmetric, no bruits LYMPHATICS:  No cervical adenopathy LUNGS:  Clear to auscultation bilaterally HEART:  Irregularly irregular.  PMI not displaced or sustained,S1 and S2 within normal limits, no S3, no S4, no clicks, no rubs, no murmurs ABD:  Flat, positive bowel sounds normal in frequency in pitch, no bruits, no rebound, no guarding, no midline pulsatile mass, no hepatomegaly, no  splenomegaly EXT:  2 plus pulses throughout, no edema, no cyanosis no clubbing SKIN:  No rashes no nodules NEURO:  Dementia.  Unable to cooperate with full neuro exam. PSYCH:  Oriented to self only.   EKG:  EKG is not ordered today.   Echo 09/16/16: Study Conclusions  - Left ventricle: The cavity size was normal. Wall thickness was   normal. Systolic function was moderately reduced. The estimated   ejection fraction was in the range of 35% to 40%. Moderate   diffuse hypokinesis with no identifiable regional variations.   Acoustic contrast opacification revealed no evidence ofthrombus. - Ventricular  septum: Septal motion showed abnormal function,   dyssynergy, and paradox. These changes are consistent with a left   bundle branch block. - Left atrium: The atrium was mildly dilated.  Recent Labs: 02/05/2016: ALT 11 09/14/2016: B Natriuretic Peptide 188.2 09/15/2016: Magnesium 2.0; TSH 0.738 10/03/2016: BUN 20; Creatinine, Ser 1.40; Hemoglobin 10.5; Platelets 191; Potassium 3.6; Sodium 142    Lipid Panel No results found for: CHOL, TRIG, HDL, CHOLHDL, VLDL, LDLCALC, LDLDIRECT    Wt Readings from Last 3 Encounters:  10/03/16 91.2 kg (201 lb)  09/27/16 91.2 kg (201 lb)  09/15/16 91.5 kg (201 lb 11.5 oz)      ASSESSMENT AND PLAN:  # Persistent atrial fibrillation: Anna [name] remains in atrial fibrillation.  Rates are well-controlled.  Sh is not on anticoagulation due to history of GI bleed and fall risk.  Continue metoprolol and aspirin.  Will discuss reducing aspirin to 81 mg daily.    # Chronic systolic and diastolic heart failure: Anna [name] is currently euvolemic. Continue Lasix 29-30 mg daily and metoprolol. Blood pressure is well-controlled. LVEF 35-40% on her most recent echocardiogram. She is not on an ACE inhibitor due to low blood pressure. She is also had renal dysfunction.   # Hyperlipidemia:  Continue pravastatin.   Time spent: 35 minutes-Greater than 50% of this  time was spent in counseling, explanation of diagnosis, planning of further management, and coordination of care.  Current medicines are reviewed at length with the patient today.  The patient does not have concerns regarding medicines.  The following changes have been made:  None  Labs/ tests ordered today include:   No orders of the defined types were placed in this encounter.   Time spent: 30 minutes-Greater than 50% of this time was spent in counseling, explanation of diagnosis, planning of further management, and coordination of care.   Disposition:   FU with Anna [name] C. Oval Linsey, MD, [facility] in 3 months   This note was written with the assistance of speech recognition software.  Please excuse any transcriptional errors.  Signed, Xandra Laramee C. Oval Linsey, MD, [facility]  12/05/2016 10:35 PM    [facility]

## 2016-12-13 ENCOUNTER — Encounter: Payer: Self-pay | Admitting: Cardiovascular Disease

## 2016-12-20 ENCOUNTER — Ambulatory Visit (INDEPENDENT_AMBULATORY_CARE_PROVIDER_SITE_OTHER): Payer: Medicare HMO | Admitting: Adult Health

## 2016-12-20 ENCOUNTER — Ambulatory Visit (INDEPENDENT_AMBULATORY_CARE_PROVIDER_SITE_OTHER)
Admission: RE | Admit: 2016-12-20 | Discharge: 2016-12-20 | Disposition: A | Discharge status: Home / Self Care | Payer: Medicare HMO | Source: Ambulatory Visit | Attending: Adult Health | Admitting: Adult Health

## 2016-12-20 ENCOUNTER — Telehealth: Payer: Self-pay | Admitting: Adult Health

## 2016-12-20 ENCOUNTER — Encounter: Payer: Self-pay | Admitting: Adult Health

## 2016-12-20 VITALS — BP 118/78

## 2016-12-20 DIAGNOSIS — J189 Pneumonia, unspecified organism: Secondary | ICD-10-CM

## 2016-12-20 DIAGNOSIS — J181 Lobar pneumonia, unspecified organism: Principal | ICD-10-CM

## 2016-12-20 DIAGNOSIS — I517 Cardiomegaly: Secondary | ICD-10-CM | POA: Diagnosis not present

## 2016-12-20 DIAGNOSIS — R5383 Other fatigue: Secondary | ICD-10-CM | POA: Diagnosis not present

## 2016-12-20 DIAGNOSIS — E118 Type 2 diabetes mellitus with unspecified complications: Secondary | ICD-10-CM

## 2016-12-20 DIAGNOSIS — I481 Persistent atrial fibrillation: Secondary | ICD-10-CM | POA: Diagnosis not present

## 2016-12-20 DIAGNOSIS — F0390 Unspecified dementia without behavioral disturbance: Secondary | ICD-10-CM | POA: Diagnosis not present

## 2016-12-20 DIAGNOSIS — I4819 Other persistent atrial fibrillation: Secondary | ICD-10-CM

## 2016-12-20 LAB — BASIC METABOLIC PANEL
BUN: 18 mg/dL (ref 6–23)
CHLORIDE: 106 meq/L (ref 96–112)
CO2: 28 mEq/L (ref 19–32)
CREATININE: 1.3 mg/dL — AB (ref 0.40–1.20)
Calcium: 9.3 mg/dL (ref 8.4–10.5)
GFR: 41.27 mL/min — ABNORMAL LOW (ref >60.00)
Glucose, Bld: 140 mg/dL — ABNORMAL HIGH (ref 70–99)
POTASSIUM: 3.8 meq/L (ref 3.5–5.1)
SODIUM: 142 meq/L (ref 135–145)

## 2016-12-20 LAB — CBC WITH DIFFERENTIAL/PLATELET
BASOS PCT: 1 % (ref 0.0–3.0)
Basophils Absolute: 0.1 10*3/uL (ref 0.0–0.1)
EOS ABS: 0.2 10*3/uL (ref 0.0–0.7)
Eosinophils Relative: 3.1 % (ref 0.0–5.0)
HEMATOCRIT: 34.2 % — AB (ref 36.0–46.0)
HEMOGLOBIN: 10.2 g/dL — AB (ref 12.0–15.0)
LYMPHS PCT: 26.2 % (ref 12.0–46.0)
Lymphs Abs: 1.4 10*3/uL (ref 0.7–4.0)
MCHC: 29.9 g/dL — ABNORMAL LOW (ref 30.0–36.0)
MCV: 77.2 fl — ABNORMAL LOW (ref 78.0–100.0)
MONOS PCT: 7.9 % (ref 3.0–12.0)
Monocytes Absolute: 0.4 10*3/uL (ref 0.1–1.0)
Neutro Abs: 3.2 10*3/uL (ref 1.4–7.7)
Neutrophils Relative %: 61.8 % (ref 43.0–77.0)
Platelets: 224 10*3/uL (ref 150.0–400.0)
RBC: 4.43 Mil/uL (ref 3.87–5.11)
RDW: 21.5 % — AB (ref 11.5–15.5)
WBC: 5.2 10*3/uL (ref 4.0–10.5)

## 2016-12-20 LAB — TSH: TSH: 0.41 u[IU]/mL (ref 0.35–4.50)

## 2016-12-20 MED ORDER — PREDNISONE 20 MG PO TABS: 20.0000 mg | ORAL_TABLET | Freq: Every day | ORAL | 0 refills | Status: DC

## 2016-12-20 MED ORDER — LEVOFLOXACIN 750 MG PO TABS: 750.0000 mg | ORAL_TABLET | Freq: Every day | ORAL | 0 refills | Status: DC

## 2016-12-20 NOTE — Telephone Encounter (Signed)
Spoke to daughter [name] and informed her of her labs and chest x ray .   IMPRESSION: 1. Suboptimal examination demonstrating findings concerning for diffuse bronchitis and possible bronchopneumonia in the lower lobes of the lungs. 2. Mild cardiomegaly.  I will call in an additional 5 days of Levaquin and short course of prednisone.   Since this is a reoccurring issues I am going to send her to pulmonary for further evaluation

## 2016-12-20 NOTE — Progress Notes (Signed)
Patient presents to clinic today to establish care. She is a pleasant 83 year old female who  has a past medical history of CHF (congestive heart failure) ([place]); Chronic renal insufficiency, stage III (moderate); Dementia; Diabetes mellitus without complication ([place]); GI bleeding (11/2013); Hyperlipidemia; Hypertension; Hyperthyroidism; LBBB (left bundle branch block); Overactive bladder; Overweight; Persistent atrial fibrillation ([place]); Vitamin B12 deficiency; and Vitamin D deficiency.   Acute Concerns: Establish Care  Pneumonia - family reports that Ms. Mungin has been treated multiple times since February for pneumonia and bronchitis. She had been treated with multiple rounds of antibiotics as well as around of Augmentin. Family reports that they do not think she is getting any better and she still seems fatigued and often sleeps a lot. There has been a couple of days where Ms. Shevlin has had a low-grade fever. Family reports that they do not believe she is back to her baseline. Family denies much of a cough and Ms. Guastella has not had any nausea vomiting or diarrhea. Most recent course of Levaquin was prescribed a week ago and they will finish this course today.  Chronic Issues:  Persistent A fib- Rate controlled. She is not on anticoagulation due to history of GI bleed and fall risk. Currently taking Metoprolol and aspirin .   Chronic systolic and diastolic heart failure - Takes lasix 20-30 mg   Dementia - Takes Aricept 10 mg daily. Family believes that her symptoms have slowly become worse. She can remember family names and has an easy time spelling. She is unable to answer multiple point questions and has short term memory issues.   Diabetes - Family reports controlled. Last A1c was 01/2016 with a reading of 6.0    Health Maintenance: Immunizations --UTD  Colonoscopy -- No longer needs Mammogram -- No longer needs PAP -- No longer needs Bone Density -- No longer needs  She is  followed by   Cardiology - Dr. Oval Linsey  Urology - Dr. Karsten Ro      Past Medical History:  Diagnosis Date  . CHF (congestive heart failure) ([place])   . Chronic renal insufficiency, stage III (moderate)   . Dementia   . Diabetes mellitus without complication ([place])   . GI bleeding 11/2013   due to supratherapeutic INR  . Hyperlipidemia   . Hypertension   . Hyperthyroidism    on amiodarone  . LBBB (left bundle branch block)   . Overactive bladder   . Overweight   . Persistent atrial fibrillation ([place])   . Vitamin B12 deficiency   . Vitamin D deficiency     Past Surgical History:  Procedure Laterality Date  . ABDOMINAL HYSTERECTOMY      Current Outpatient Prescriptions on File Prior to Visit  Medication Sig Dispense Refill  . ACCU-CHEK AVIVA PLUS test strip PATIENT TO CHECK SUGARS ONCE DAILY.  3  . acetaminophen (TYLENOL) 500 MG tablet Take 500 mg by mouth every 6 (six) hours as needed for fever.     Marland Kitchen albuterol (PROVENTIL HFA;VENTOLIN HFA) 108 (90 Base) MCG/ACT inhaler Inhale 2 puffs into the lungs every 6 (six) hours as needed for wheezing or shortness of breath. 1 Inhaler 2  . amoxicillin-clavulanate (AUGMENTIN) 875-125 MG tablet Take 1 tablet by mouth 2 (two) times daily.    Marland Kitchen aspirin 325 MG tablet Take 325 mg by mouth daily.    . calcitRIOL (ROCALTROL) 0.5 MCG capsule Take 0.5 mcg by mouth 3 (three) times a week. Monday, Wednesday, and Friday only    .  carbamide peroxide (DEBROX) 6.5 % otic solution Place 5 drops into both ears 2 (two) times daily. (Patient taking differently: Place 5 drops into both ears daily as needed. ) 15 mL 0  . docusate sodium (COLACE) 100 MG capsule Take 100 mg by mouth daily as needed for mild constipation.    Marland Kitchen donepezil (ARICEPT) 10 MG tablet Take 10 mg by mouth at bedtime.    . fluticasone (FLONASE) 50 MCG/ACT nasal spray Place 2 sprays into both nostrils daily.    . furosemide (LASIX) 20 MG tablet Take 20 mg by mouth daily. Take an extra 0.5  tablet daily as needed.    Marland Kitchen glimepiride (AMARYL) 2 MG tablet Take 2 mg by mouth daily with breakfast.    . guaiFENesin (MUCINEX) 600 MG 12 hr tablet Take 600 mg by mouth 2 (two) times daily as needed for cough. Reported on 12/22/2015    . loratadine (ALAVERT) 10 MG tablet Take 5 mg by mouth daily. Every 2 days     . metoprolol tartrate (LOPRESSOR) 25 MG tablet Take 1 tablet (25 mg total) by mouth 2 (two) times daily. 60 tablet 2  . Polyethyl Glycol-Propyl Glycol (SYSTANE) 0.4-0.3 % SOLN Apply 1 drop to eye 2 (two) times daily as needed (dry eyes).    . pravastatin (PRAVACHOL) 40 MG tablet Take 40 mg by mouth daily.    . vitamin B-12 (CYANOCOBALAMIN) 500 MCG tablet Take 500 mcg by mouth daily.    . Vitamin D, Ergocalciferol, (DRISDOL) 50000 UNITS CAPS capsule Take 50,000 Units by mouth once a week. Mondays     No current facility-administered medications on file prior to visit.     Allergies  Allergen Reactions  . Pollen Extract     Sneezing, watery eyes    Family History  Problem Relation Age of Onset  . Heart disease Father   . Diabetes Mother   . Thyroid disease Neg Hx     Social History   Social History  . Marital status: Unknown    Spouse name: N/A  . Number of children: N/A  . Years of education: N/A   Occupational History  . Not on file.   Social History Main Topics  . Smoking status: Never Smoker  . Smokeless tobacco: Never Used  . Alcohol use No  . Drug use: No  . Sexual activity: Not on file   Other Topics Concern  . Not on file   Social History Narrative   Pt recently moved to [place] with daughter from [place]  Unable to perform ROS: Dementia    BP 118/78 (BP Location: Left Arm, Patient Position: Sitting, Cuff Size: Large)   Physical Exam  Constitutional: She is well-developed, well-nourished, and in no distress. Vital signs are normal. She appears unhealthy. No distress.  Clearly confused and falls asleep often in her  chair  HENT:  Head: Normocephalic and atraumatic.  Right Ear: External ear normal.  Left Ear: External ear normal.  Nose: Nose normal.  Mouth/Throat: Oropharynx is clear and moist. No oropharyngeal exudate.  Eyes: Conjunctivae and EOM are normal. Pupils are equal, round, and reactive to light. Right eye exhibits no discharge. Left eye exhibits no discharge. No scleral icterus.  Neck: Normal range of motion. Neck supple.  Cardiovascular: Normal rate and intact distal pulses.  An irregularly irregular rhythm present. Exam reveals no gallop and no friction rub.   No murmur heard. Pulmonary/Chest: Effort normal and breath sounds  normal. No respiratory distress. She has no wheezes. She has no rales. She exhibits no tenderness.  Abdominal: Soft. Bowel sounds are normal. She exhibits no distension and no mass. There is no tenderness. There is no rebound and no guarding.  Lymphadenopathy:    She has no cervical adenopathy.  Neurological:  Dementia.  Unable to cooperate with full neuro exam.  Skin: Skin is warm and dry. No rash noted. She is not diaphoretic. No erythema. No pallor.  Psychiatric: She has a flat affect.  Nursing note and vitals reviewed.   Assessment/Plan: 1. Fatigue, unspecified type - Possibly due to recurrence of pneumonia. Will get chest x-ray to rule out infection. Consider referral to pulmonary since this has been an ongoing issue over the last couple months - Basic metabolic panel - CBC with Differential/Platelet - DG Chest 2 View; Future - TSH - Iron and TIBC  2. Diabetes mellitus with complication (HCC) - Continue Amaryl 2 mg - DG Chest 2 View; Future  3. Persistent atrial fibrillation (HCC) - Rate controlled. Continue metoprolol and aspirin - Basic metabolic panel - CBC with Differential/Platelet - DG Chest 2 View; Future - TSH - Iron and TIBC   4. Dementia without behavioral disturbance, unspecified dementia type - Continue with Aricept 10 mg. Could  consider adding Namenda to medication regimen. I am not convinced that adding this medication will provide any benefit to the patient at this time   Dorothyann Peng, NP

## 2016-12-21 ENCOUNTER — Other Ambulatory Visit: Payer: Self-pay

## 2016-12-21 ENCOUNTER — Telehealth: Payer: Self-pay | Admitting: Internal Medicine

## 2016-12-21 ENCOUNTER — Telehealth: Payer: Self-pay | Admitting: Adult Health

## 2016-12-21 LAB — POCT URINALYSIS DIPSTICK
Bilirubin, UA: NEGATIVE
Blood, UA: NEGATIVE
Glucose, UA: NEGATIVE
KETONES UA: NEGATIVE
Leukocytes, UA: NEGATIVE
Nitrite, UA: NEGATIVE
PROTEIN UA: NEGATIVE
Urobilinogen, UA: 0.2 E.U./dL
pH, UA: 6 (ref 5.0–8.0)

## 2016-12-21 LAB — IRON AND TIBC
%SAT: 6 % — AB (ref 11–50)
Iron: 21 ug/dL — ABNORMAL LOW (ref 45–160)
TIBC: 324 ug/dL (ref 250–450)
UIBC: 303 ug/dL (ref 125–400)

## 2016-12-21 MED ORDER — LEVOFLOXACIN 750 MG PO TABS
750.0000 mg | ORAL_TABLET | Freq: Every day | ORAL | 0 refills | Status: DC
Start: 2016-12-21 — End: 2017-01-05

## 2016-12-21 MED ORDER — PREDNISONE 20 MG PO TABS: 20.0000 mg | ORAL_TABLET | Freq: Every day | ORAL | 0 refills | Status: DC

## 2016-12-21 NOTE — Addendum Note (Signed)
Addended by: Sandria Bales B on: 12/21/2016 02:53 PM   Modules accepted: Orders

## 2016-12-21 NOTE — Telephone Encounter (Signed)
° °  [facility] she uses [facility] can med be resent please    [facility] and [facility]     predniSONE (DELTASONE) 20 MG tablet  levofloxacin (LEVAQUIN) 750 MG tablet

## 2016-12-21 NOTE — Telephone Encounter (Signed)
Prescriptions have been sent to [facility].

## 2016-12-22 NOTE — Telephone Encounter (Signed)
Error

## 2016-12-29 ENCOUNTER — Telehealth: Payer: Self-pay | Admitting: Adult Health

## 2016-12-29 ENCOUNTER — Telehealth: Payer: Self-pay | Admitting: Emergency Medicine

## 2016-12-29 NOTE — Telephone Encounter (Signed)
Spoke with patient daughter [name]) she states that she only wanted to ask Tommi Rumps about her mom coming off the prednisone and would she fell sluggish and tired? Per Tommi Rumps pt was only on a 5 day supply and pt would not feel sluggish or tired. Pt daughter understood but would like a call back. Pt has an appointment with Pulmonary scheduled for May 30th

## 2016-12-29 NOTE — Telephone Encounter (Signed)
I spoke with patient's daughter, [name].  I advised her of Cory's comments and she verbalized understanding.  I also advised patient's daughter that if she notices any worsening symptoms such as shortness of breath, fever, chest pain, etc., that patient should immediatley be seen in the ED. Patient's daughter verbalized understanding.  Pulmonary appt has been moved to May 17 at 1:15 - I notified patient of this as well. Thanks!

## 2016-12-29 NOTE — Telephone Encounter (Signed)
Patient Name: AYN DOMANGUE  DOB: [birth year]    Initial Comment Mom has been on prednisone for 5 days, stopped it the day before yesterday, also has been on Levaquin. Mother has a hard time breathing and seems week    Nurse Assessment  Nurse: Raphael Gibney, RN, Vanita Ingles Date/Time (Eastern Time): 12/29/2016 1:06:38 PM  Confirm and document reason for call. If symptomatic, describe symptoms. ---Caller states mother was taking prednisone 20 mg daily for 7 days and completed it and completed levaquin for 5 days for pneumonia and bronchitis. She is weak and having SOB when she stands up. no fever. She is coughing more and is choking when she eats something occasionally. oxygen sats 94-96%.  Does the patient have any new or worsening symptoms? ---Yes  Will a triage be completed? ---Yes  Related visit to physician within the last 2 weeks? ---Yes  Does the PT have any chronic conditions? (i.e. diabetes, asthma, etc.) ---Yes  List chronic conditions. ---A fib; dementia; diabetes; stage 3 kidney failure  Is this a behavioral health or substance abuse call? ---No     Guidelines    Guideline Title Affirmed Question Affirmed Notes  Cough - Chronic Difficulty breathing (Exception: no change from usual, chronic shortness of breath)    Final Disposition User   Go to ED Now (or PCP triage) Raphael Gibney, RN, Vera    Comments  Daughter only wants mother to see Dorothyann Peng and he does not have any appts available within 4 hrs. Does not want to see another doctor or go to the ER or urgent care. Called back line and gave report to Tommi Rumps that pt saw Dorothyann Peng last week and was diagnosed with bronchitis and pneumonia. She has completed 5 days of levaquin and 7 days of prednisone. Her cough has worsened, she is weak and SOB when she stands. No fever. Triage outcome of go to ER now or (or PCP triage). Daughter only wants to see Tommi Rumps and does not want to take her to the ER.   Referrals  GO TO FACILITY REFUSED    Disagree/Comply: Disagree  Disagree/Comply Reason: Disagree with instructions

## 2016-12-30 ENCOUNTER — Telehealth: Payer: Self-pay | Admitting: Adult Health

## 2016-12-30 ENCOUNTER — Emergency Department (HOSPITAL_COMMUNITY): Payer: Medicare HMO

## 2016-12-30 ENCOUNTER — Encounter (HOSPITAL_COMMUNITY): Payer: Self-pay | Admitting: Emergency Medicine

## 2016-12-30 ENCOUNTER — Emergency Department (HOSPITAL_COMMUNITY)
Admission: EM | Admit: 2016-12-30 | Discharge: 2016-12-30 | Disposition: A | Discharge status: Home / Self Care | Payer: Medicare HMO | Attending: Emergency Medicine | Admitting: Emergency Medicine

## 2016-12-30 DIAGNOSIS — R0602 Shortness of breath: Secondary | ICD-10-CM | POA: Diagnosis not present

## 2016-12-30 DIAGNOSIS — R531 Weakness: Secondary | ICD-10-CM | POA: Diagnosis not present

## 2016-12-30 DIAGNOSIS — Z7982 Long term (current) use of aspirin: Secondary | ICD-10-CM | POA: Insufficient documentation

## 2016-12-30 DIAGNOSIS — E079 Disorder of thyroid, unspecified: Secondary | ICD-10-CM

## 2016-12-30 DIAGNOSIS — I5041 Acute combined systolic (congestive) and diastolic (congestive) heart failure: Secondary | ICD-10-CM | POA: Insufficient documentation

## 2016-12-30 DIAGNOSIS — I130 Hypertensive heart and chronic kidney disease with heart failure and stage 1 through stage 4 chronic kidney disease, or unspecified chronic kidney disease: Secondary | ICD-10-CM | POA: Diagnosis not present

## 2016-12-30 DIAGNOSIS — R42 Dizziness and giddiness: Secondary | ICD-10-CM | POA: Diagnosis not present

## 2016-12-30 DIAGNOSIS — R404 Transient alteration of awareness: Secondary | ICD-10-CM | POA: Diagnosis not present

## 2016-12-30 DIAGNOSIS — E041 Nontoxic single thyroid nodule: Secondary | ICD-10-CM | POA: Diagnosis not present

## 2016-12-30 DIAGNOSIS — R791 Abnormal coagulation profile: Secondary | ICD-10-CM | POA: Diagnosis not present

## 2016-12-30 DIAGNOSIS — F0390 Unspecified dementia without behavioral disturbance: Secondary | ICD-10-CM | POA: Diagnosis not present

## 2016-12-30 DIAGNOSIS — R0989 Other specified symptoms and signs involving the circulatory and respiratory systems: Secondary | ICD-10-CM | POA: Diagnosis not present

## 2016-12-30 DIAGNOSIS — R5383 Other fatigue: Secondary | ICD-10-CM

## 2016-12-30 DIAGNOSIS — Z79899 Other long term (current) drug therapy: Secondary | ICD-10-CM | POA: Insufficient documentation

## 2016-12-30 DIAGNOSIS — R221 Localized swelling, mass and lump, neck: Secondary | ICD-10-CM | POA: Diagnosis not present

## 2016-12-30 DIAGNOSIS — E119 Type 2 diabetes mellitus without complications: Secondary | ICD-10-CM | POA: Diagnosis not present

## 2016-12-30 DIAGNOSIS — Z7984 Long term (current) use of oral hypoglycemic drugs: Secondary | ICD-10-CM | POA: Diagnosis not present

## 2016-12-30 DIAGNOSIS — R7989 Other specified abnormal findings of blood chemistry: Secondary | ICD-10-CM

## 2016-12-30 DIAGNOSIS — N183 Chronic kidney disease, stage 3 (moderate): Secondary | ICD-10-CM | POA: Diagnosis not present

## 2016-12-30 DIAGNOSIS — R9431 Abnormal electrocardiogram [ECG] [EKG]: Secondary | ICD-10-CM | POA: Diagnosis not present

## 2016-12-30 HISTORY — DX: Pneumonia, unspecified organism: J18.9

## 2016-12-30 HISTORY — DX: Bronchitis, not specified as acute or chronic: J40

## 2016-12-30 LAB — COMPREHENSIVE METABOLIC PANEL
ALBUMIN: 3.6 g/dL (ref 3.5–5.0)
ALK PHOS: 61 U/L (ref 38–126)
ALT: 9 U/L — ABNORMAL LOW (ref 14–54)
ANION GAP: 4 — AB (ref 5–15)
AST: 12 U/L — ABNORMAL LOW (ref 15–41)
BILIRUBIN TOTAL: 1.1 mg/dL (ref 0.3–1.2)
BUN: 22 mg/dL — AB (ref 6–20)
CALCIUM: 8.5 mg/dL — AB (ref 8.9–10.3)
CO2: 27 mmol/L (ref 22–32)
Chloride: 109 mmol/L (ref 101–111)
Creatinine, Ser: 1.2 mg/dL — ABNORMAL HIGH (ref 0.44–1.00)
GFR calc Af Amer: 46 mL/min — ABNORMAL LOW (ref >60)
GFR calc non Af Amer: 40 mL/min — ABNORMAL LOW (ref >60)
GLUCOSE: 166 mg/dL — AB (ref 65–99)
Potassium: 3.8 mmol/L (ref 3.5–5.1)
Sodium: 140 mmol/L (ref 135–145)
TOTAL PROTEIN: 6.1 g/dL — AB (ref 6.5–8.1)

## 2016-12-30 LAB — CBC WITH DIFFERENTIAL/PLATELET
BASOS PCT: 1 %
Basophils Absolute: 0 10*3/uL (ref 0.0–0.1)
Eosinophils Absolute: 0.2 10*3/uL (ref 0.0–0.7)
Eosinophils Relative: 2 %
HEMATOCRIT: 34.8 % — AB (ref 36.0–46.0)
HEMOGLOBIN: 10.1 g/dL — AB (ref 12.0–15.0)
LYMPHS ABS: 1.9 10*3/uL (ref 0.7–4.0)
LYMPHS PCT: 24 %
MCH: 22.9 pg — AB (ref 26.0–34.0)
MCHC: 29 g/dL — AB (ref 30.0–36.0)
MCV: 78.7 fL (ref 78.0–100.0)
MONOS PCT: 10 %
Monocytes Absolute: 0.8 10*3/uL (ref 0.1–1.0)
NEUTROS ABS: 5 10*3/uL (ref 1.7–7.7)
NEUTROS PCT: 63 %
Platelets: 203 10*3/uL (ref 150–400)
RBC: 4.42 MIL/uL (ref 3.87–5.11)
RDW: 18.8 % — ABNORMAL HIGH (ref 11.5–15.5)
WBC: 8 10*3/uL (ref 4.0–10.5)

## 2016-12-30 LAB — I-STAT TROPONIN, ED: Troponin i, poc: 0.01 ng/mL (ref 0.00–0.08)

## 2016-12-30 LAB — BRAIN NATRIURETIC PEPTIDE: B NATRIURETIC PEPTIDE 5: 108.5 pg/mL — AB (ref 0.0–100.0)

## 2016-12-30 LAB — URINALYSIS, ROUTINE W REFLEX MICROSCOPIC
BILIRUBIN URINE: NEGATIVE
Glucose, UA: NEGATIVE mg/dL
HGB URINE DIPSTICK: NEGATIVE
Ketones, ur: NEGATIVE mg/dL
Leukocytes, UA: NEGATIVE
Nitrite: NEGATIVE
PH: 6 (ref 5.0–8.0)
Protein, ur: NEGATIVE mg/dL
Specific Gravity, Urine: 1.006 (ref 1.005–1.030)

## 2016-12-30 LAB — TSH: TSH: 0.421 u[IU]/mL (ref 0.350–4.500)

## 2016-12-30 LAB — RAPID URINE DRUG SCREEN, HOSP PERFORMED
Amphetamines: NOT DETECTED
BARBITURATES: NOT DETECTED
Benzodiazepines: NOT DETECTED
Cocaine: NOT DETECTED
Opiates: NOT DETECTED
TETRAHYDROCANNABINOL: NOT DETECTED

## 2016-12-30 LAB — D-DIMER, QUANTITATIVE (NOT AT ARMC): D DIMER QUANT: 11.31 ug{FEU}/mL — AB (ref 0.00–0.50)

## 2016-12-30 LAB — CBG MONITORING, ED: GLUCOSE-CAPILLARY: 152 mg/dL — AB (ref 65–99)

## 2016-12-30 MED ORDER — IPRATROPIUM-ALBUTEROL 0.5-2.5 (3) MG/3ML IN SOLN
3.0000 mL | Freq: Once | RESPIRATORY_TRACT | Status: AC
  Administered 2016-12-30: 3 mL via RESPIRATORY_TRACT
  Filled 2016-12-30: qty 3

## 2016-12-30 MED ORDER — IOPAMIDOL (ISOVUE-370) INJECTION 76%
INTRAVENOUS | Status: AC
  Filled 2016-12-30: qty 100

## 2016-12-30 MED ORDER — IOPAMIDOL (ISOVUE-370) INJECTION 76%
INTRAVENOUS | Status: AC
  Administered 2016-12-30: 100 mL
  Filled 2016-12-30: qty 100

## 2016-12-30 NOTE — ED Notes (Signed)
Pt transported to radiology.

## 2016-12-30 NOTE — Discharge Instructions (Signed)
You need to call your primary care physician or endocrinologist Monday morning to schedule a follow up appointment for the thyroid mass we saw on your CT scan today.   Return to ER for fevers, new or worsening symptoms, any additional concerns.

## 2016-12-30 NOTE — Telephone Encounter (Signed)
° ° °  Pt daughter call to say that she is taking her Mom to [facility] and said you told her to call because you would call ahead and let them know why she is bringing pt to the hospital

## 2016-12-30 NOTE — ED Provider Notes (Signed)
[facility] DEPT Provider Note   CSN: [id number] Arrival date & time: 12/30/16  1247     History   Chief Complaint Chief Complaint  Patient presents with  . Pneumonia    HPI Anna [name] is a 83 y.o. female.  HPI   Level V caveat due to altered mental status.  Anna [name] is a 84 y.o. female, with a history of CHF, DM, dementia, HTN, A. fib, pneumonia, and bronchitis, presenting to the ED with "lethargy," fatigue, sleeping more, and "not feeling well." Endorses nonproductive cough. Patient is usually laughing, talking, and participates in her own activities of daily living.  Previously with recurrent fever and tachycardia. Patient was treated for HCAP in Feb 2018. Augmentin for 7 days beginning 4/10. Levaquin 500 mg daily from 4/25-5/2. Levaquin 750mg  daily beginning 5/2 for 5 days along with 20 mg prednisone daily for 7 days. States her mother seems to improve while on ABX with increased energy and feeding herself again.    History given by Coralyn Mark, daughter and primary care giver. Patient is nonverbal during interview.  Denies fever this week, vomiting/diarrhea, falls or trauma, rashes, or any other complaints.    Past Medical History:  Diagnosis Date  . Bronchitis   . CHF (congestive heart failure) ([place])   . Chronic renal insufficiency, stage III (moderate)   . Dementia   . Diabetes mellitus without complication ([place])   . GI bleeding 11/2013   due to supratherapeutic INR  . Hyperlipidemia   . Hypertension   . Hyperthyroidism    on amiodarone  . LBBB (left bundle branch block)   . Overactive bladder   . Overweight   . Persistent atrial fibrillation ([place])   . Pneumonia   . Vitamin B12 deficiency   . Vitamin D deficiency     Patient Active Problem List   Diagnosis Date Noted  . Acute on chronic diastolic heart failure ([place])   . Atrial fibrillation ([place]) 09/15/2016  . Atrial fibrillation with RVR ([place]) 09/15/2016  . Near syncope 02/05/2016  .  Pre-syncope 02/05/2016  . Chest pain   . Diabetes mellitus with complication ([place])   . Hypokalemia 08/28/2015  . UTI (lower urinary tract infection) 08/26/2015  . Generalized weakness 08/26/2015  . Acute combined systolic and diastolic heart failure ([place]) 02/16/2015  . Hyperthyroidism 01/28/2015  . Dyspnea 01/27/2015  . SOB (shortness of breath) 01/27/2015  . Failure to thrive in adult 01/27/2015  . Benign essential HTN 01/27/2015  . DM (diabetes mellitus) type II controlled with renal manifestation ([place]) 01/27/2015  . CKD (chronic kidney disease), stage III 01/27/2015    Past Surgical History:  Procedure Laterality Date  . ABDOMINAL HYSTERECTOMY      OB History    No data available       Home Medications    Prior to Admission medications   Medication Sig Start Date End Date Taking? Authorizing Provider  ACCU-CHEK AVIVA PLUS test strip PATIENT TO CHECK SUGARS ONCE DAILY. 11/19/15   [provider]  acetaminophen (TYLENOL) 500 MG tablet Take 500 mg by mouth every 6 (six) hours as needed for fever.     [provider]  albuterol (PROVENTIL HFA;VENTOLIN HFA) 108 (90 Base) MCG/ACT inhaler Inhale 2 puffs into the lungs every 6 (six) hours as needed for wheezing or shortness of breath. 10/03/16   Varney Biles, MD  amoxicillin-clavulanate (AUGMENTIN) 875-125 MG tablet Take 1 tablet by mouth 2 (two) times daily. 11/29/16   [provider]  aspirin 325 MG tablet Take 325 mg by mouth daily.    [provider]  calcitRIOL (ROCALTROL) 0.5 MCG capsule Take 0.5 mcg by mouth 3 (three) times a week. Monday, Wednesday, and Friday only 01/07/15   [provider]  carbamide peroxide (DEBROX) 6.5 % otic solution Place 5 drops into both ears 2 (two) times daily. Patient taking differently: Place 5 drops into both ears daily as needed.  02/09/16   Geradine Girt, DO  docusate sodium (COLACE) 100 MG capsule Take 100 mg by mouth daily as needed for mild  constipation.    [provider]  donepezil (ARICEPT) 10 MG tablet Take 10 mg by mouth at bedtime.    [provider]  fluticasone (FLONASE) 50 MCG/ACT nasal spray Place 2 sprays into both nostrils daily.    [provider]  furosemide (LASIX) 20 MG tablet Take 20 mg by mouth daily. Take an extra 0.5 tablet daily as needed.    [provider]  glimepiride (AMARYL) 2 MG tablet Take 2 mg by mouth daily with breakfast.    [provider]  guaiFENesin (MUCINEX) 600 MG 12 hr tablet Take 600 mg by mouth 2 (two) times daily as needed for cough. Reported on 12/22/2015    [provider]  levofloxacin (LEVAQUIN) 750 MG tablet Take 1 tablet (750 mg total) by mouth daily. 12/21/16   Nafziger, Tommi Rumps, NP  loratadine (ALAVERT) 10 MG tablet Take 5 mg by mouth daily. Every 2 days     [provider]  metoprolol tartrate (LOPRESSOR) 25 MG tablet Take 1 tablet (25 mg total) by mouth 2 (two) times daily. 09/17/16   Sid Falcon, MD  Polyethyl Glycol-Propyl Glycol (SYSTANE) 0.4-0.3 % SOLN Apply 1 drop to eye 2 (two) times daily as needed (dry eyes).    [provider]  pravastatin (PRAVACHOL) 40 MG tablet Take 40 mg by mouth daily.    [provider]  predniSONE (DELTASONE) 20 MG tablet Take 1 tablet (20 mg total) by mouth daily with breakfast. 12/21/16   Nafziger, Tommi Rumps, NP  vitamin B-12 (CYANOCOBALAMIN) 500 MCG tablet Take 500 mcg by mouth daily.    [provider]  Vitamin D, Ergocalciferol, (DRISDOL) 50000 UNITS CAPS capsule Take 50,000 Units by mouth once a week. Mondays 01/07/15   [provider]    Family History Family History  Problem Relation Age of Onset  . Heart disease Father   . Diabetes Mother   . Thyroid disease Neg Hx     Social History Social History  Substance Use Topics  . Smoking status: Never Smoker  . Smokeless tobacco: Never Used  . Alcohol use No     Allergies   Pollen  extract   Review of Systems Review of Systems  Unable to perform ROS: Mental status change     Physical Exam Updated Vital Signs BP 119/76 (BP Location: Left Arm)   Pulse 69   Temp 97.5 F (36.4 C) (Oral)   Resp 14   Ht 5\' 5"  (1.651 m)   Wt 88.5 kg   SpO2 96%   BMI 32.45 kg/m   Physical Exam  Constitutional: She appears well-developed and well-nourished. No distress.  HENT:  Head: Normocephalic and atraumatic.  Eyes: Conjunctivae are normal.  Neck: Neck supple.  Cardiovascular: Normal rate, regular rhythm, normal heart sounds and intact distal pulses.   Pulmonary/Chest: Tachypnea noted. She has decreased breath sounds.  Increased work of breathing noted. Patient acts as if  she is trying to speak, but then acts as if she is trying to catch her breath.  Abdominal: Soft. There is no tenderness. There is no guarding.  Musculoskeletal: She exhibits no edema.  Lymphadenopathy:    She has no cervical adenopathy.  Neurological: She is alert.  Patient will smile, close her eyes, raise her hands, and move her feet on command. Strength rated 4/5 and equal bilaterally. Patient does not respond to other commands.   Skin: Skin is warm and dry. She is not diaphoretic.  Psychiatric: She has a normal mood and affect. Her behavior is normal.  Nursing note and vitals reviewed.    ED Treatments / Results  Labs (all labs ordered are listed, but only abnormal results are displayed) Labs Reviewed  CBC WITH DIFFERENTIAL/PLATELET - Abnormal; Notable for the following:       Result Value   Hemoglobin 10.1 (*)    HCT 34.8 (*)    MCH 22.9 (*)    MCHC 29.0 (*)    RDW 18.8 (*)    All other components within normal limits  COMPREHENSIVE METABOLIC PANEL - Abnormal; Notable for the following:    Glucose, Bld 166 (*)    BUN 22 (*)    Creatinine, Ser 1.20 (*)    Calcium 8.5 (*)    Total Protein 6.1 (*)    AST 12 (*)    ALT 9 (*)    GFR calc non Af Amer 40 (*)    GFR calc Af Amer 46 (*)     Anion gap 4 (*)    All other components within normal limits  D-DIMER, QUANTITATIVE (NOT AT [facility]) - Abnormal; Notable for the following:    D-Dimer, Quant 11.31 (*)    All other components within normal limits  CBG MONITORING, ED - Abnormal; Notable for the following:    Glucose-Capillary 152 (*)    All other components within normal limits  URINALYSIS, ROUTINE W REFLEX MICROSCOPIC  RAPID URINE DRUG SCREEN, HOSP PERFORMED  BRAIN NATRIURETIC PEPTIDE  I-STAT TROPOININ, ED    EKG  EKG Interpretation  Date/Time:  Friday Dec 30 2016 13:06:20 EDT Ventricular Rate:  64 PR Interval:    QRS Duration: 157 QT Interval:  460 QTC Calculation: 475 R Axis:   -37 Text Interpretation:  Atrial fibrillation Left bundle branch block Confirmed by Alvino Chapel  MD, Ovid Curd [phone]) on 12/30/2016 3:27:16 PM       Radiology No results found.  Procedures Procedures (including critical care time)  Medications Ordered in ED Medications  ipratropium-albuterol (DUONEB) 0.5-2.5 (3) MG/3ML nebulizer solution 3 mL (3 mLs Nebulization Given 12/30/16 1523)     Initial Impression / Assessment and Plan / ED Course  I have reviewed the triage vital signs and the nursing notes.  Pertinent labs & imaging results that were available during my care of the patient were reviewed by me and considered in my medical decision making (see chart for details).  Clinical Course as of Dec 30 1600  Fri Dec 30, 2016  1545 Patient now with spontaneous eye opening. Still shows increased work of breathing and still only follows few commands.  [SJ]    Clinical Course User Index [SJ] Aanshi Batchelder C, PA-C     Patient presents with change in level of consciousness, correlated clinically.  Ill-appearing upon initial evaluation. Altered mental status workup. Ddimer positive.   End of shift patient care handoff report given to [facility], PA-C. Plan: Continue AMS workup.  Findings and plan  of care discussed with Davonna Belling, MD. Dr. Alvino Chapel personally evaluated and examined this patient.   Vitals:   12/30/16 1303 12/30/16 1430 12/30/16 1500 12/30/16 1515  BP:  127/77 129/65   Pulse:  77 71 68  Resp:  19 (!) 23 18  Temp: 97.5 F (36.4 C)     TempSrc: Oral     SpO2:  93% 93% 94%  Weight:      Height:         Final Clinical Impressions(s) / ED Diagnoses   Final diagnoses:  None    New Prescriptions New Prescriptions   No medications on file     Layla Maw 12/30/16 1604    Lorayne Bender, PA-C 12/30/16 1605    Davonna Belling, MD 01/02/17 1502

## 2016-12-30 NOTE — ED Provider Notes (Signed)
Care assumed from previous provider PA Joy. Please see note for further details. Case discussed, plan agreed upon. Briefly, patient is a 83 y.o. female who presents to ER with daughter for concerns of recurrent pneumonia. Daughter at bedside notes she has been much less interactive than usual, sleeping more than normal and just "not feeling well". Last finished ABX on 5/07. Labs already resulted prior to shift change reviewed by me. White count of 8.0. Trop negative. CMP baseline. At shift change, I evaluated patient who had just finished duoneb. Daughter notes that she seems to feel better and is a little more pleasant and interactive.  Gen: afebrile, VSS HEENT: Atraumatic Resp: no resp distress, no wheezing. CV: RRR Abd: soft, NT, ND MsK: moving all extremities well  CXR, UA, BNP, d-dimer pending. Will follow up on these and dispo appropriately.   UA not infectious. BNP very minimally elevated at 108.5. CXR with mild vascular congestion. D-dimer elevated, therefore CT angiogram was obtained. CT shows no evidence of PE, however does show a 4.1 cm mass to the left thyroid lobe, recommending further evaluation with thyroid ultrasound. TSH obtained and wdl.   I discussed patient case with attending, Dr. Sherry Ruffing. He has reviewed labs and imaging with me. Evaluation does not show pathology that would require ongoing emergent intervention or inpatient treatment. She is maintaining oxygenation sats greater than 93% throughout ED stay. No tachycardia or fevers. Discussed all of these results at length with the patient and daughter at bedside. They understand the importance of following up with her endocrinologist for further evaluation of her thyroid. They will also follow-up with primary care provider next week. I spoke at length about concerning symptoms that would require return to the emergency department. Family and patient feel comfortable with discharge home and would very much like to be home  together for Mother's Day. All questions were answered and patient discharged to home in good condition.    Simuel Stebner, Ozella Almond, PA-C 12/30/16 2216    Tegeler, Gwenyth Allegra, MD 12/31/16 [id number]

## 2016-12-30 NOTE — ED Notes (Signed)
Bed: WA10 Expected date: 12/30/16 Expected time: 12:41 PM Means of arrival: Ambulance Comments: Pneumonia

## 2016-12-30 NOTE — ED Triage Notes (Signed)
Per EMS, patient dx with pneumonia earlier this month. Patient has been PO antibiotics x7 days. Family states patient does not seem to be getting better. Hx of advanced dementia. Alert and oriented x1 (pts baseline). Patient is coming from home.

## 2016-12-30 NOTE — ED Notes (Signed)
ED Provider at bedside. 

## 2016-12-30 NOTE — Telephone Encounter (Signed)
Patient is currently admitted. I will route to [facility] as FYI.

## 2017-01-02 ENCOUNTER — Ambulatory Visit (INDEPENDENT_AMBULATORY_CARE_PROVIDER_SITE_OTHER): Payer: Medicare HMO | Admitting: Endocrinology

## 2017-01-02 ENCOUNTER — Encounter: Payer: Self-pay | Admitting: Endocrinology

## 2017-01-02 ENCOUNTER — Telehealth: Payer: Self-pay | Admitting: Endocrinology

## 2017-01-02 VITALS — BP 132/78 | HR 73

## 2017-01-02 DIAGNOSIS — E041 Nontoxic single thyroid nodule: Secondary | ICD-10-CM | POA: Diagnosis not present

## 2017-01-02 NOTE — Telephone Encounter (Signed)
Patient is coming in today for mass on throat she has been having trouble swelling, she had a x ray and found a thyroid mass on throat.

## 2017-01-02 NOTE — Telephone Encounter (Signed)
See message to be advised.  

## 2017-01-02 NOTE — Progress Notes (Signed)
Patient ID: Anna [name], female   DOB: 1928/10/14, 83 y.o.   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 a few months after stopping amiodarone  According to her daughter she has had recurrent problems with pneumonia, some coughing, difficulty swallowing and she thinks she has discomfort in her throat at times This has been going on for the last 3-4 months  However her CT scan of the lungs was done because of her pneumonia, altered mental status and abnormal d-dimer This showed a 4.1 cm left thyroid nodule which is heterogenous  Previously on her exams no thyroid enlargement has been noticed  Lab Results  Component Value Date   TSH 0.421 12/30/2016   TSH 0.41 12/20/2016   TSH 0.738 09/15/2016   FREET4 1.12 02/17/2016   FREET4 0.88 01/08/2016   FREET4 0.87 12/17/2015    Allergies as of 01/02/2017      Reactions   Pollen Extract    Sneezing, watery eyes      Medication List       Accurate as of 01/02/17  4:55 PM. Always use your most recent med list.          ACCU-CHEK AVIVA PLUS test strip Generic drug:  glucose blood PATIENT TO CHECK SUGARS ONCE DAILY.   acetaminophen 500 MG tablet Commonly known as:  TYLENOL Take 500 mg by mouth every 6 (six) hours as needed for fever.   ALAVERT 10 MG tablet Generic drug:  loratadine Take 5 mg by mouth daily. Every 2 days   albuterol 108 (90 Base) MCG/ACT inhaler Commonly known as:  PROVENTIL HFA;VENTOLIN HFA Inhale 2 puffs into the lungs every 6 (six) hours as needed for wheezing or shortness of breath.   aspirin 325 MG tablet Take 325 mg by mouth daily.   calcitRIOL 0.5 MCG capsule Commonly known as:  ROCALTROL Take 0.5 mcg by mouth 3 (three) times a week. Monday, Wednesday, and  Friday only   carbamide peroxide 6.5 % otic solution Commonly known as:  DEBROX Place 5 drops into both ears 2 (two) times daily.   docusate sodium 100 MG capsule Commonly known as:  COLACE Take 100 mg by mouth daily as needed for mild constipation.   donepezil 10 MG tablet Commonly known as:  ARICEPT Take 10 mg by mouth at bedtime.   fluticasone 50 MCG/ACT nasal spray Commonly known as:  FLONASE Place 2 sprays into both nostrils daily.   furosemide 20 MG tablet Commonly known as:  LASIX Take 20 mg by mouth daily. Take an extra 0.5 tablet daily as needed.   glimepiride 2 MG tablet Commonly known as:  AMARYL Take 2 mg by mouth daily with breakfast.   guaiFENesin 600 MG 12 hr tablet Commonly known as:  MUCINEX Take 600 mg by mouth 2 (two) times daily as needed for cough. Reported on 12/22/2015   levofloxacin 750 MG tablet Commonly known as:  LEVAQUIN Take 1 tablet (750 mg total) by mouth daily.   metoprolol tartrate 25 MG tablet Commonly known as:  LOPRESSOR Take 1 tablet (25 mg total) by mouth 2 (two) times daily.   pravastatin 40 MG tablet Commonly known as:  PRAVACHOL Take 40 mg by mouth daily.  predniSONE 20 MG tablet Commonly known as:  DELTASONE Take 1 tablet (20 mg total) by mouth daily with breakfast.   SYSTANE 0.4-0.3 % Soln Generic drug:  Polyethyl Glycol-Propyl Glycol Apply 1 drop to eye 2 (two) times daily as needed (dry eyes).   vitamin B-12 500 MCG tablet Commonly known as:  CYANOCOBALAMIN Take 500 mcg by mouth daily.   Vitamin D (Ergocalciferol) 50000 units Caps capsule Commonly known as:  DRISDOL Take 50,000 Units by mouth once a week. Mondays       Allergies:  Allergies  Allergen Reactions  . Pollen Extract     Sneezing, watery eyes    Past Medical History:  Diagnosis Date  . Bronchitis   . CHF (congestive heart failure) ([place])   . Chronic renal insufficiency, stage III (moderate)   . Dementia   . Diabetes mellitus without  complication ([place])   . GI bleeding 11/2013   due to supratherapeutic INR  . Hyperlipidemia   . Hypertension   . Hyperthyroidism    on amiodarone  . LBBB (left bundle branch block)   . Overactive bladder   . Overweight   . Persistent atrial fibrillation ([place])   . Pneumonia   . Vitamin B12 deficiency   . Vitamin D deficiency     Past Surgical History:  Procedure Laterality Date  . ABDOMINAL HYSTERECTOMY      Family History  Problem Relation Age of Onset  . Heart disease Father   . Diabetes Mother   . Thyroid disease Neg Hx     Social History:  reports that she has never smoked. She has never used smokeless tobacco. She reports that she does not drink alcohol or use drugs.   Admission on 12/30/2016, Discharged on 12/30/2016  Component Date Value Ref Range Status  . Glucose-Capillary 12/30/2016 152* 65 - 99 mg/dL Final  . WBC 12/30/2016 8.0  4.0 - 10.5 K/uL Final  . RBC 12/30/2016 4.42  3.87 - 5.11 MIL/uL Final  . Hemoglobin 12/30/2016 10.1* 12.0 - 15.0 g/dL Final  . HCT 12/30/2016 34.8* 36.0 - 46.0 % Final  . MCV 12/30/2016 78.7  78.0 - 100.0 fL Final  . MCH 12/30/2016 22.9* 26.0 - 34.0 pg Final  . MCHC 12/30/2016 29.0* 30.0 - 36.0 g/dL Final  . RDW 12/30/2016 18.8* 11.5 - 15.5 % Final  . Platelets 12/30/2016 203  150 - 400 K/uL Final  . Neutrophils Relative % 12/30/2016 63  % Final  . Neutro Abs 12/30/2016 5.0  1.7 - 7.7 K/uL Final  . Lymphocytes Relative 12/30/2016 24  % Final  . Lymphs Abs 12/30/2016 1.9  0.7 - 4.0 K/uL Final  . Monocytes Relative 12/30/2016 10  % Final  . Monocytes Absolute 12/30/2016 0.8  0.1 - 1.0 K/uL Final  . Eosinophils Relative 12/30/2016 2  % Final  . Eosinophils Absolute 12/30/2016 0.2  0.0 - 0.7 K/uL Final  . Basophils Relative 12/30/2016 1  % Final  . Basophils Absolute 12/30/2016 0.0  0.0 - 0.1 K/uL Final  . Sodium 12/30/2016 140  135 - 145 mmol/L Final  . Potassium 12/30/2016 3.8  3.5 - 5.1 mmol/L Final  . Chloride 12/30/2016 109   101 - 111 mmol/L Final  . CO2 12/30/2016 27  22 - 32 mmol/L Final  . Glucose, Bld 12/30/2016 166* 65 - 99 mg/dL Final  . BUN 12/30/2016 22* 6 - 20 mg/dL Final  . Creatinine, Ser 12/30/2016 1.20* 0.44 - 1.00 mg/dL Final  . Calcium 12/30/2016 8.5*  8.9 - 10.3 mg/dL Final  . Total Protein 12/30/2016 6.1* 6.5 - 8.1 g/dL Final  . Albumin 12/30/2016 3.6  3.5 - 5.0 g/dL Final  . AST 12/30/2016 12* 15 - 41 U/L Final  . ALT 12/30/2016 9* 14 - 54 U/L Final  . Alkaline Phosphatase 12/30/2016 61  38 - 126 U/L Final  . Total Bilirubin 12/30/2016 1.1  0.3 - 1.2 mg/dL Final  . GFR calc non Af Amer 12/30/2016 40* >60 mL/min Final  . GFR calc Af Amer 12/30/2016 46* >60 mL/min Final   Comment: (NOTE) The eGFR has been calculated using the CKD EPI equation. This calculation has not been validated in all clinical situations. eGFR's persistently <60 mL/min signify possible Chronic Kidney Disease.   . Anion gap 12/30/2016 4* 5 - 15 Final  . Troponin i, poc 12/30/2016 0.01  0.00 - 0.08 ng/mL Final  . Comment 3 12/30/2016          Final   Comment: Due to the release kinetics of cTnI, a negative result within the first hours of the onset of symptoms does not rule out myocardial infarction with certainty. If myocardial infarction is still suspected, repeat the test at appropriate intervals.   . Color, Urine 12/30/2016 STRAW* YELLOW Final  . APPearance 12/30/2016 CLEAR  CLEAR Final  . Specific Gravity, Urine 12/30/2016 1.006  1.005 - 1.030 Final  . pH 12/30/2016 6.0  5.0 - 8.0 Final  . Glucose, UA 12/30/2016 NEGATIVE  NEGATIVE mg/dL Final  . Hgb urine dipstick 12/30/2016 NEGATIVE  NEGATIVE Final  . Bilirubin Urine 12/30/2016 NEGATIVE  NEGATIVE Final  . Ketones, ur 12/30/2016 NEGATIVE  NEGATIVE mg/dL Final  . Protein, ur 12/30/2016 NEGATIVE  NEGATIVE mg/dL Final  . Nitrite 12/30/2016 NEGATIVE  NEGATIVE Final  . Leukocytes, UA 12/30/2016 NEGATIVE  NEGATIVE Final  . Opiates 12/30/2016 NONE DETECTED  NONE  DETECTED Final  . Cocaine 12/30/2016 NONE DETECTED  NONE DETECTED Final  . Benzodiazepines 12/30/2016 NONE DETECTED  NONE DETECTED Final  . Amphetamines 12/30/2016 NONE DETECTED  NONE DETECTED Final  . Tetrahydrocannabinol 12/30/2016 NONE DETECTED  NONE DETECTED Final  . Barbiturates 12/30/2016 NONE DETECTED  NONE DETECTED Final   Comment:        DRUG SCREEN FOR MEDICAL PURPOSES ONLY.  IF CONFIRMATION IS NEEDED FOR ANY PURPOSE, NOTIFY LAB WITHIN 5 DAYS.        LOWEST DETECTABLE LIMITS FOR URINE DRUG SCREEN Drug Class       Cutoff (ng/mL) Amphetamine      1000 Barbiturate      200 Benzodiazepine   828 Tricyclics       003 Opiates          300 Cocaine          300 THC              50   . B Natriuretic Peptide 12/30/2016 108.5* 0.0 - 100.0 pg/mL Final  . D-Dimer, Quant 12/30/2016 11.31* 0.00 - 0.50 ug/mL-FEU Final   Comment: (NOTE) At the manufacturer cut-off of 0.50 ug/mL FEU, this assay has been documented to exclude PE with a sensitivity and negative predictive value of 97 to 99%.  At this time, this assay has not been approved by the FDA to exclude DVT/VTE. Results should be correlated with clinical presentation.   . TSH 12/30/2016 0.421  0.350 - 4.500 uIU/mL Final   Performed by a 3rd Generation assay with a functional sensitivity of <=0.01 uIU/mL.    EXAM:  BP 132/78   Pulse 73   SpO2 98%   Physical Exam  Patient is in a wheelchair and noncommunicative She is able to swallow a couple of water with a little delay No thyroid masses palpable on the left side even on swallowing  Biceps reflexes appear normal   Assessment/Plan:     Left thyroid nodule: This appears to be incidental and although it's relatively large at 4.1 not clear if this is causing her dysphagia.  Currently having problems with recurrent pneumonia and possible aspiration Apparently has not been evaluated.  Function the difficulties with swallowing Thyroid function is normal with normal TSH  ruling out recurrence of her hyperthyroidism.  Will proceed with thyroid ultrasound although not clear the patient will cooperate for a potential biopsy Reassured her daughter that there is only a 5% chance of malignancy  She will also follow-up with her PCP and see her pulmonologist this week  Most likely she needs a barium swallow to further assess her dysphagia  Michaelpaul Apo 01/02/2017, 4:55 PM

## 2017-01-02 NOTE — Telephone Encounter (Signed)
Am seeing her

## 2017-01-04 ENCOUNTER — Other Ambulatory Visit: Payer: Self-pay | Admitting: Adult Health

## 2017-01-04 NOTE — Telephone Encounter (Signed)
She is followed by endocrinology.  I would think she would be getting refills from them.

## 2017-01-04 NOTE — Telephone Encounter (Signed)
This medication was last refilled 01/01/15. Dr. Elease Hashimoto, would you mind reviewing this while Tommi Rumps is out of the office?

## 2017-01-05 ENCOUNTER — Encounter: Payer: Self-pay | Admitting: Internal Medicine

## 2017-01-05 ENCOUNTER — Ambulatory Visit (INDEPENDENT_AMBULATORY_CARE_PROVIDER_SITE_OTHER): Payer: Medicare HMO | Admitting: Internal Medicine

## 2017-01-05 VITALS — BP 124/76 | HR 72

## 2017-01-05 DIAGNOSIS — R05 Cough: Secondary | ICD-10-CM | POA: Diagnosis not present

## 2017-01-05 DIAGNOSIS — R058 Other specified cough: Secondary | ICD-10-CM

## 2017-01-05 MED ORDER — OMEPRAZOLE 40 MG PO CPDR: DELAYED_RELEASE_CAPSULE | ORAL | 11 refills | Status: DC

## 2017-01-05 NOTE — Telephone Encounter (Signed)
See below

## 2017-01-05 NOTE — Patient Instructions (Addendum)
Omeprazole 40 mg Take 30- 60 min before your first and last meals of the day    GERD (REFLUX)  is an extremely common cause of respiratory symptoms just like yours , many times with no obvious heartburn at all.    It can be treated with medication, but also with lifestyle changes including elevation of the head of your bed (ideally with 6 inch  bed blocks),  Smoking cessation, avoidance of late meals, excessive alcohol, and avoid fatty foods, chocolate, peppermint, colas, red wine, and acidic juices such as orange juice.  NO MINT OR MENTHOL PRODUCTS SO NO COUGH DROPS  USE SUGARLESS CANDY INSTEAD (Jolley ranchers or Stover's or Life Savers) or even ice chips will also do - the key is to swallow to prevent all throat clearing. NO OIL BASED VITAMINS - use powdered substitutes.  Pulmonary follow up is as needed but the swallowing evaluation should be next and she may need ent evaluation in the meantime but I would defer referral to the primary doctor so he can coordinate amongst the specialists

## 2017-01-05 NOTE — Progress Notes (Signed)
Subjective:     Patient ID: Anna [name], female   DOB: 01-06-1929,    MRN: [id number]  HPI  61 yowf never smoker with advance dementia moved to [facility] from [place] around 2015 and since Jan 2018 episodes of  "pna" with main symptoms = grabbing throat, cough, low grade fever dx as pna clinically though CTa neg 12/30/16   01/05/2017 1st  [facility] office visit/ Katriana Dortch   Chief Complaint  Patient presents with  . Pulmonary Consult    Referred by Dorothyann Peng. Pt had PNA x 3 since Jan 2018. She c/o cough and occ choking spells. Her cough has been non prod.     Sleeps fine/ walker x 6 steps only then stops  due to weakness / not sob  Main issue is intermittent dysphagia, choking on foods Seemed better a given round of prednisone and neb rx in past  No obvious day to day or daytime variability or assoc excess/ purulent sputum or mucus plugs or hemoptysis or cp or chest tightness, subjective wheeze or overt sinus or hb symptoms. No unusual exp hx or h/o childhood pna/ asthma or knowledge of premature birth.  Sleeping ok without nocturnal  or early am exacerbation  of respiratory  c/o's or need for noct saba. Also denies any obvious fluctuation of symptoms with weather or environmental changes or other aggravating or alleviating factors except as outlined above   Current Medications, Allergies, Complete Past Medical History, Past Surgical History, Family History, and Social History were reviewed in [employer] record.  ROS  The following are not active complaints unless bolded sore throat, dysphagia, dental problems, itching, sneezing,  nasal congestion or excess/ purulent secretions, ear ache,   fever, chills, sweats, unintended wt loss, classically pleuritic or exertional cp,  orthopnea pnd or leg swelling, presyncope, palpitations, abdominal pain, anorexia, nausea, vomiting, diarrhea  or change in bowel or bladder habits, change in stools or urine,  dysuria,hematuria,  rash, arthralgias, visual complaints, headache, numbness, weakness or ataxia or problems with walking or coordination,  change in mood/affect or memory.            Review of Systems     Objective:   Physical Exam   W/c bound elderly wf  Very stoic/ sits slumped over with blank stare most of the time     Wt Readings from Last 3 Encounters:  12/30/16 195 lb (88.5 kg)  10/03/16 201 lb (91.2 kg)  09/27/16 201 lb (91.2 kg)    Vital signs reviewed  - Note on arrival 02 sats  98% on RA     HEENT: nl dentition, turbinates bilaterally - could not open mouth wide enough for inspection . Nl external ear canals without cough reflex   NECK :  without JVD/Nodes/TM/ nl carotid upstrokes bilaterally   LUNGS: no acc muscle use,  Nl contour chest which is clear to A and P bilaterally without cough on insp or exp maneuvers   CV:  RRR  no s3 or murmur or increase in P2, and no edema   ABD:  soft and nontender with nl inspiratory excursion. No bruits or organomegaly appreciated, bowel sounds nl  MS:  Nl gait/ ext warm without deformities, calf tenderness, cyanosis or clubbing No obvious joint restrictions   SKIN: warm and dry without lesions    NEURO:  No spontaneous speech or verbal responses, would stick out tongue on 3rd or 4th request. No obvious focal motor deficits.  I personally reviewed images and agree with radiology impression as follows:  CTa Chest   12/30/16 1. No evidence of pulmonary embolus. Evaluation for pulmonary embolus is suboptimal due to motion artifact. 2. Chronically increased interstitial markings, with bibasilar atelectasis. Underlying mild chronic interstitial lung disease cannot be excluded. Would correlate with the chronicity of the patient's symptoms. 3. Cardiomegaly.  Diffuse coronary artery calcifications seen. 4. Enlarged mediastinal nodes, measuring up to 1.5 cm in short axis, of uncertain significance.   CT sinus  12/30/16  Neg sinus  dz    Labs ordered/ reviewed:      Chemistry      Component Value Date/Time   NA 140 12/30/2016 1451   K 3.8 12/30/2016 1451   CL 109 12/30/2016 1451   CO2 27 12/30/2016 1451   BUN 22 (H) 12/30/2016 1451   CREATININE 1.20 (H) 12/30/2016 1451   CREATININE 1.25 (H) 09/27/2016 1201      Component Value Date/Time   CALCIUM 8.5 (L) 12/30/2016 1451   ALKPHOS 61 12/30/2016 1451   AST 12 (L) 12/30/2016 1451   ALT 9 (L) 12/30/2016 1451   BILITOT 1.1 12/30/2016 1451        Lab Results  Component Value Date   WBC 8.0 12/30/2016   HGB 10.1 (L) 12/30/2016   HCT 34.8 (L) 12/30/2016   MCV 78.7 12/30/2016   PLT 203 12/30/2016     Lab Results  Component Value Date   DDIMER 11.31 (H) 12/30/2016      Lab Results  Component Value Date   TSH 0.421 12/30/2016               Assessment:

## 2017-01-06 ENCOUNTER — Encounter: Payer: Self-pay | Admitting: Family Medicine

## 2017-01-06 ENCOUNTER — Ambulatory Visit
Admission: RE | Admit: 2017-01-06 | Discharge: 2017-01-06 | Disposition: A | Discharge status: Home / Self Care | Payer: Medicare HMO | Source: Ambulatory Visit | Attending: Endocrinology | Admitting: Endocrinology

## 2017-01-06 ENCOUNTER — Ambulatory Visit (INDEPENDENT_AMBULATORY_CARE_PROVIDER_SITE_OTHER): Payer: Medicare HMO | Admitting: Family Medicine

## 2017-01-06 VITALS — BP 122/78 | HR 78 | Temp 98.0°F

## 2017-01-06 DIAGNOSIS — R058 Other specified cough: Secondary | ICD-10-CM | POA: Insufficient documentation

## 2017-01-06 DIAGNOSIS — R05 Cough: Secondary | ICD-10-CM | POA: Insufficient documentation

## 2017-01-06 DIAGNOSIS — H6122 Impacted cerumen, left ear: Secondary | ICD-10-CM

## 2017-01-06 DIAGNOSIS — E041 Nontoxic single thyroid nodule: Secondary | ICD-10-CM

## 2017-01-06 DIAGNOSIS — R918 Other nonspecific abnormal finding of lung field: Secondary | ICD-10-CM | POA: Diagnosis not present

## 2017-01-06 MED ORDER — CALCITRIOL 0.5 MCG PO CAPS: 0.5000 ug | ORAL_CAPSULE | ORAL | 3 refills | Status: DC

## 2017-01-06 NOTE — Progress Notes (Signed)
Subjective:     Patient ID: Anna [name], female   DOB: 1929/06/10, 83 y.o.   MRN: [id number]  HPI Patient has multiple chronic problems including history of chronic kidney disease, diabetes, combined systolic and diastolic heart failure, atrial fibrillation, hypertension. She is brought in by daughter with concerns of cerumen impaction left canal. She's had problems with this previously. No ear drainage. No fevers or chills.  Past Medical History:  Diagnosis Date  . Bronchitis   . CHF (congestive heart failure) ([place])   . Chronic renal insufficiency, stage III (moderate)   . Dementia   . Diabetes mellitus without complication ([place])   . GI bleeding 11/2013   due to supratherapeutic INR  . Hyperlipidemia   . Hypertension   . Hyperthyroidism    on amiodarone  . LBBB (left bundle branch block)   . Overactive bladder   . Overweight   . Persistent atrial fibrillation ([place])   . Pneumonia   . Vitamin B12 deficiency   . Vitamin D deficiency    Past Surgical History:  Procedure Laterality Date  . ABDOMINAL HYSTERECTOMY      reports that she has never smoked. She has never used smokeless tobacco. She reports that she does not drink alcohol or use drugs. family history includes Diabetes in her mother; Heart disease in her father. Allergies  Allergen Reactions  . Pollen Extract     Sneezing, watery eyes     Review of Systems  Constitutional: Negative for chills and fever.  HENT: Negative for ear pain.        Objective:   Physical Exam  Constitutional: She appears well-developed and well-nourished.  HENT:  Mild cerumen left canal. Nurse attempted irrigation and did remove some cerumen but patient became uncooperative. She has only a residual thin membrane of wax left and we attempted to remove with curette the patient again uncooperative. Most of eardrum visualized and normal  Cardiovascular: Normal rate.   Pulmonary/Chest: Effort normal and breath sounds normal. No respiratory  distress. She has no wheezes. She has no rales.       Assessment:     Cerumen impaction left canal. Most of this was removed with irrigation. She had only residual thin membrane of wax and patient uncooperative with removal    Plan:     -No further intervention required at this time. -Continue close follow-up with primary -We did refill her calcitriol until she can follow-up with her primary  Eulas Post MD [facility]

## 2017-01-06 NOTE — Patient Instructions (Signed)
WE NOW OFFER    [facility] FAST TRACK!!!  SAME DAY Appointments for ACUTE CARE  Such as: Sprains, Injuries, cuts, abrasions, rashes, muscle pain, joint pain, back pain Colds, flu, sore throats, headache, allergies, cough, fever  Ear pain, sinus and eye infections Abdominal pain, nausea, vomiting, diarrhea, upset stomach Animal/insect bites  3 Easy Ways to Schedule: Walk-In Scheduling Call in scheduling Mychart Sign-up: [email or web address]         

## 2017-01-06 NOTE — Assessment & Plan Note (Signed)
Onset Jan 2018  - CTa chest 12/30/16 neg except bibasilar atx/ mild ILD  -  01/05/2017   rec max rx for gerd and then ST eval >>>  Her exam is neg today other than the neurologic findings and I since she grabs her throat and starts coughing at the onset of the symptoms most likely this is a form of Upper airway cough syndrome (previously labeled PNDS) , is  so named because it's frequently impossible to sort out how much is  CR/sinusitis with freq throat clearing (which can be related to primary GERD)   vs  causing  secondary (" extra esophageal")  GERD from wide swings in gastric pressure that occur with throat clearing, often  promoting self use of mint and menthol lozenges that reduce the lower esophageal sphincter tone and exacerbate the problem further in a cyclical fashion.   These are the same pts (now being labeled as having "irritable larynx syndrome" by some cough centers) who not infrequently have a history of having failed to tolerate ace inhibitors,  dry powder inhalers or biphosphonates or report having atypical/extraesophageal reflux symptoms that don't respond to standard doses of PPI  and are easily confused as having aecopd or asthma flares by even experienced allergists/ pulmonologists (myself included).   In her case the most likely cause is LPR/ asp though I could easily see this being confused with asthma and or pneumonia > I note she had the CTa during one of the daughter's perceived episode of pna which was proven not to be the case but would be very skeptical of the quality of a cxr in this setting and would rely more on the clinical exam before rx with additional abx which risk [place] and creating resistant organisms.   The elevated d dimer is a bit suggestive of underlying malignancy and the thyroid nodule is bothersome finding but does not appear to be compressing the trachea enough to cause any symptoms so I defer to PCP whether to pursue w/u which might include ENT referral at some  point.   I don't believe the daughters dealing very well with the fact that her mother's in the final chapter of her life and although she's done a great job keeping her home and attending to her every need,  I explained frankly to her that there was a limit to what medical science can do in this setting and  Very limited role for specialty care esp advanced pulmonary/ccm interventions but I would be happy to see back prn if new issues arise.  Total time devoted to counseling  > 50 % of initial 60 min office visit:  review case with pt/daughter discussion of options/alternatives/ personally creating written customized instructions  in presence of pt  then going over those specific  Instructions directly with the pt including how to use all of the meds but in particular covering each new medication in detail and the difference between the maintenance= "automatic" meds and the prns using an action plan format for the latter (If this problem/symptom => do that organization reading Left to right).  Please see AVS from this visit for a full list of these instructions which I personally wrote for this pt and  are unique to this visit.

## 2017-01-08 NOTE — Progress Notes (Signed)
Please let her daughter know that the left-sided nodule is 4.6 cm and requires needle aspiration biopsy if the patient can cooperate, if agreeable we will send referral to [facility] imaging for scheduling this

## 2017-01-10 ENCOUNTER — Telehealth: Payer: Self-pay | Admitting: Adult Health

## 2017-01-10 ENCOUNTER — Other Ambulatory Visit: Payer: Self-pay | Admitting: Endocrinology

## 2017-01-10 DIAGNOSIS — E041 Nontoxic single thyroid nodule: Secondary | ICD-10-CM

## 2017-01-10 NOTE — Telephone Encounter (Signed)
[name] Daughter [phone]  [name] is calling to see what the [place] results were for [facility]. Please call an advise.

## 2017-01-10 NOTE — Telephone Encounter (Signed)
Cory - Please advise. Thanks! 

## 2017-01-10 NOTE — Telephone Encounter (Signed)
Called and left [name] a message to call back so I can go over results with her about her mom Anna [name].

## 2017-01-10 NOTE — Telephone Encounter (Signed)
° ° °  Pt daughter [name] call to say pt has a mass on her left thyroid please see her labs in system because her iron and hemoglobin low . They currently have her on fusion plus.which were samples from another doctor and is asking if pt should continue taking the med.  Would like a call back   Daughter is aware Tommi Rumps is out of office

## 2017-01-10 NOTE — Telephone Encounter (Signed)
What medication

## 2017-01-11 ENCOUNTER — Telehealth: Payer: Self-pay | Admitting: Endocrinology

## 2017-01-11 NOTE — Telephone Encounter (Signed)
Patient states she is unable to get in touch with [place] for her mother, asking for return phone call.

## 2017-01-12 ENCOUNTER — Other Ambulatory Visit (HOSPITAL_COMMUNITY)
Admission: RE | Admit: 2017-01-12 | Discharge: 2017-01-12 | Disposition: A | Discharge status: Home / Self Care | Payer: Medicare HMO | Source: Ambulatory Visit | Attending: Student | Admitting: Student

## 2017-01-12 ENCOUNTER — Other Ambulatory Visit: Payer: Self-pay | Admitting: Adult Health

## 2017-01-12 ENCOUNTER — Encounter: Payer: Self-pay | Admitting: Internal Medicine

## 2017-01-12 ENCOUNTER — Ambulatory Visit
Admission: RE | Admit: 2017-01-12 | Discharge: 2017-01-12 | Disposition: A | Discharge status: Home / Self Care | Payer: Medicare HMO | Source: Ambulatory Visit | Attending: Endocrinology | Admitting: Endocrinology

## 2017-01-12 DIAGNOSIS — E041 Nontoxic single thyroid nodule: Secondary | ICD-10-CM

## 2017-01-12 DIAGNOSIS — D34 Benign neoplasm of thyroid gland: Secondary | ICD-10-CM | POA: Insufficient documentation

## 2017-01-12 DIAGNOSIS — R1310 Dysphagia, unspecified: Secondary | ICD-10-CM

## 2017-01-12 NOTE — Telephone Encounter (Signed)
What samples did she get?  Endocrinology recommended a barium swallow to evaluate her swallowing Difficulties. I have placed this order and will also place an order for her to see GI

## 2017-01-12 NOTE — Telephone Encounter (Signed)
Called patient and spoke to [name] her daughter and she was able to get through to [place] and they have an appointment today.

## 2017-01-13 ENCOUNTER — Other Ambulatory Visit: Payer: Self-pay | Admitting: Adult Health

## 2017-01-13 MED ORDER — FUSION PLUS PO CAPS: 1.0000 | ORAL_CAPSULE | Freq: Every day | ORAL | 3 refills | Status: DC

## 2017-01-13 NOTE — Telephone Encounter (Signed)
It is ok to take this medication. I have sent in the prescription

## 2017-01-13 NOTE — Telephone Encounter (Signed)
Patient has been taking FusionPlus, an iron and probiotic vitamin supplement. Can be purchased with a prescription at local pharmacies. Should pt continue taking?  Thanks!

## 2017-01-17 ENCOUNTER — Other Ambulatory Visit: Payer: Self-pay | Admitting: Adult Health

## 2017-01-17 ENCOUNTER — Encounter: Payer: Self-pay | Admitting: Endocrinology

## 2017-01-17 NOTE — Telephone Encounter (Signed)
Ok to refill for one year  

## 2017-01-17 NOTE — Telephone Encounter (Signed)
Patients daughter has been notified and verbalized understanding.  

## 2017-01-17 NOTE — Telephone Encounter (Signed)
Ok to refill 

## 2017-01-18 ENCOUNTER — Institutional Professional Consult (permissible substitution): Payer: Medicare HMO | Admitting: Internal Medicine

## 2017-01-19 ENCOUNTER — Encounter: Payer: Self-pay | Admitting: Adult Health

## 2017-01-19 ENCOUNTER — Telehealth: Payer: Self-pay | Admitting: Endocrinology

## 2017-01-19 ENCOUNTER — Telehealth: Payer: Self-pay | Admitting: Adult Health

## 2017-01-19 ENCOUNTER — Ambulatory Visit
Admission: RE | Admit: 2017-01-19 | Discharge: 2017-01-19 | Disposition: A | Discharge status: Home / Self Care | Payer: Medicare HMO | Source: Ambulatory Visit | Attending: Adult Health | Admitting: Adult Health

## 2017-01-19 ENCOUNTER — Telehealth: Payer: Self-pay

## 2017-01-19 DIAGNOSIS — J189 Pneumonia, unspecified organism: Secondary | ICD-10-CM | POA: Diagnosis not present

## 2017-01-19 DIAGNOSIS — R1310 Dysphagia, unspecified: Secondary | ICD-10-CM

## 2017-01-19 NOTE — Telephone Encounter (Signed)
Called and advised the daughter regarding the message through [place], she states that she does not have any access to this, but I advised of message from [place]. Patient was very happy with these results, and is contacting PCP office regarding swallow test that was done today that she has questions on.

## 2017-01-19 NOTE — Telephone Encounter (Signed)
Patient's daughter Anna [name] called to inquire about the thyroid biopsy that the patient had last week. I advised that the results are back however, Dr. Dwyane Dee has not had a chance to review them yet. Anna [name] is requesting a call today with the results. Please advise. Okay to leave a detailed message.

## 2017-01-19 NOTE — Telephone Encounter (Signed)
See below

## 2017-01-19 NOTE — Telephone Encounter (Signed)
Pt daughter would like brooke to returning her call concerning swallowing test

## 2017-01-20 ENCOUNTER — Ambulatory Visit (INDEPENDENT_AMBULATORY_CARE_PROVIDER_SITE_OTHER): Payer: Medicare HMO | Admitting: Nurse Practitioner

## 2017-01-20 ENCOUNTER — Encounter: Payer: Self-pay | Admitting: Nurse Practitioner

## 2017-01-20 ENCOUNTER — Telehealth: Payer: Self-pay

## 2017-01-20 VITALS — BP 136/68 | HR 80

## 2017-01-20 DIAGNOSIS — R1310 Dysphagia, unspecified: Secondary | ICD-10-CM | POA: Diagnosis not present

## 2017-01-20 NOTE — Patient Instructions (Addendum)
You have been scheduled for an endoscopy. Please follow written instructions given to you at your visit today. If you use inhalers (even only as needed), please bring them with you on the day of your procedure.   

## 2017-01-20 NOTE — Telephone Encounter (Signed)
Patient was scheduled for EGD at [facility] on February 16, 2017 at 10 00 am.  Patient had to leave the office before instructions were given due to a fall out of wheelchair.  Instructions have been verbally given to daughter, [name] over the phone and also instructions mailed to the daughter.  Daughter confirmed that her mother is doing well now at home.

## 2017-01-23 NOTE — Progress Notes (Signed)
HPI: Patient is an 84 year old female, referred by PCP Dorothyann Peng, NP for dysphagia. Patient has Alzheimer's, she is unable to provide history but accompanied by her daughter [name] with whom she resides. [name], as well as her husband and her son are all patients of Dr. Henrene Pastor. Patient wuld like her mother to be under the care of Dr. Henrene Pastor as well.  Ms. Younce has a history of recurrent pneumonia/bronchitis. In the process of evaluation patient had a CT of the chest which showed a 4 cm left thyroid mass . She was evaluated by Endocrinology, biopsy was negative for malignancy . Daughter does not yet know what the plans are for this mass. Patient has been on several antibiotics as well as prednisone for recurrent PNA and bronchitis. There is a question of whether the recurrent pneumonia is secondary to aspiration. Patient recently had an esophagram which showed silent aspiration of barium. The upper thoracic esophagus could not be evaluated because the procedure was terminated due to aspiration. There was a mild mass effect upon the upper cervical esophagus and trachea by the known mediastinal mass. In retrospect, daughter now recalls mother grabbing her throat when eating or even throughout the day over the last several months.  She times coughs during meals but the most part is able to get down solid food without difficulty.    Past Medical History:  Diagnosis Date  . Alzheimer's disease   . Atrial fibrillation ([place])   . Bronchitis   . CHF (congestive heart failure) ([place])   . Chronic renal insufficiency, stage III (moderate)   . Diabetes mellitus without complication ([place])   . GERD (gastroesophageal reflux disease)   . GI bleeding 11/2013   due to supratherapeutic INR  . Hyperlipidemia   . Hypertension   . Hyperthyroidism    on amiodarone  . LBBB (left bundle branch block)   . LBBB (left bundle branch block)   . Overactive bladder   . Overweight   . Persistent atrial  fibrillation ([place])   . Pneumonia   . Vitamin B12 deficiency   . Vitamin D deficiency      Past Surgical History:  Procedure Laterality Date  . ABDOMINAL HYSTERECTOMY     Family History  Problem Relation Age of Onset  . Heart disease Father   . Diabetes Mother   . Thyroid disease Neg Hx    Social History  Substance Use Topics  . Smoking status: Never Smoker  . Smokeless tobacco: Never Used  . Alcohol use No   Current Outpatient Prescriptions  Medication Sig Dispense Refill  . ACCU-CHEK AVIVA PLUS test strip PATIENT TO CHECK SUGARS ONCE DAILY.  3  . acetaminophen (TYLENOL) 500 MG tablet Take 500 mg by mouth every 6 (six) hours as needed for fever.     Marland Kitchen aspirin 325 MG tablet Take 325 mg by mouth daily.    . calcitRIOL (ROCALTROL) 0.5 MCG capsule Take 1 capsule (0.5 mcg total) by mouth 3 (three) times a week. Monday, Wednesday, and Friday only 36 capsule 3  . carbamide peroxide (DEBROX) 6.5 % otic solution Place 5 drops into both ears 2 (two) times daily. 15 mL 0  . docusate sodium (COLACE) 100 MG capsule Take 100 mg by mouth daily as needed for mild constipation.    Marland Kitchen donepezil (ARICEPT) 10 MG tablet TAKE 1 TABLET ONCE DAILY. 90 tablet 3  . fluticasone (FLONASE) 50 MCG/ACT nasal spray Place 2 sprays into both nostrils daily.    Marland Kitchen  furosemide (LASIX) 20 MG tablet Take 20 mg by mouth daily. Take an extra 0.5 tablet daily as needed.    Marland Kitchen glimepiride (AMARYL) 2 MG tablet Take 2 mg by mouth daily with breakfast.    . guaiFENesin (MUCINEX) 600 MG 12 hr tablet Take 600 mg by mouth 2 (two) times daily as needed for cough. Reported on 12/22/2015    . Iron-FA-B Cmp-C-Biot-Probiotic (FUSION PLUS) CAPS Take 1 tablet by mouth daily. 90 capsule 3  . loratadine (ALAVERT) 10 MG tablet Take 5 mg by mouth daily. Every 2 days     . metoprolol tartrate (LOPRESSOR) 25 MG tablet Take 1 tablet (25 mg total) by mouth 2 (two) times daily. 60 tablet 2  . omeprazole (PRILOSEC) 40 MG capsule Take 30- 60 min  before your first and last meals of the day 60 capsule 11  . Polyethyl Glycol-Propyl Glycol (SYSTANE) 0.4-0.3 % SOLN Apply 1 drop to eye 2 (two) times daily as needed (dry eyes).    . pravastatin (PRAVACHOL) 40 MG tablet Take 40 mg by mouth daily.    . vitamin B-12 (CYANOCOBALAMIN) 500 MCG tablet Take 500 mcg by mouth daily.    . Vitamin D, Ergocalciferol, (DRISDOL) 50000 UNITS CAPS capsule Take 50,000 Units by mouth once a week. Mondays     No current facility-administered medications for this visit.    Allergies  Allergen Reactions  . Pollen Extract     Sneezing, watery eyes     Review of Systems: Unobtainable. Patient has Alzheimer's disease.    Physical Exam: BP 136/68   Pulse 80  Constitutional:  Overweight white female in a wheelchair in no acute distress. Psychiatric:  Pleasant, tries to be cooperative. . Behavior is normal. EENT: Pupils normal.  Conjunctivae are normal. No scleral icterus. Not evaluating oral cavity, patient would not adequately open her mouth Neck supple.  Cardiovascular: Normal rate, regular rhythm.  Pulmonary/chest: Effort normal and breath sounds normal. No wheezing, rales or rhonchi. Abdominal: limited exam in chair. Soft, obese, nontender, normal bowel sounds. . Lymphadenopathy: No cervical adenopathy noted. Skin: Skin is warm and dry. No rashes noted.   ASSESSMENT AND PLAN:  1. Pleasant 83 year old female with Alzheimer's disease here with recurrent episodes of pneumonia, possibly aspiration related. Esophagram shows mild extrinsic compression of cervical esophagus from known left thyroid mass (non-malignant). Silent reflux demonstrated on the esophagram (in supine position).  Per daughter, patient tolerates most solids. She sometimes coughs during meals and frequently grabs at her throat but the food always goes down with little effort. .  -The mild narrowing of the cervical esophagus may be enough to cause patient's symptoms though she could also  have a more distal stricture. For further evaluation patient will be scheduled for EGD to be done at the hospital.  The risks and benefits of EGD were discussed with patient's daughter who agrees to proceed    2. Recurrent PNA. She could have a component of oropharyngeal dysphagia leading to aspiration.   - A modified barium swallow study may be helpful if EGD is unrevealing.    Tye Savoy, NP  01/23/2017, 10:51 AM  Cc:  Dorothyann Peng, NP

## 2017-01-24 ENCOUNTER — Telehealth: Payer: Self-pay | Admitting: Internal Medicine

## 2017-01-24 ENCOUNTER — Telehealth: Payer: Self-pay | Admitting: Adult Health

## 2017-01-24 NOTE — Telephone Encounter (Signed)
Spoke with pts daughter and she is aware. States that actually this morning her mother has been doing better with her intake. She knows to take her to the ER if worsens. States she will also contact PCP regarding surgical appt as GI does not handle thyroid masses.

## 2017-01-24 NOTE — Telephone Encounter (Signed)
Pts daughter called and states pt is scheduled for EGD with Dr. Henrene Pastor at [facility] 02/16/17. States pt is having more issues swallowing, difficult for her to eat and drink. She has had to start opening up capsules and placing med in applesauce when possible, had very difficult time swallowing cholesterol med. Daughter also states her mother is holding her throat more especially on the left side where the mass is located. Daughter states they have not been told of a plan regarding the mass.She is concerned and does not think pt will be able to wait until the 28th. Please advise.

## 2017-01-24 NOTE — Progress Notes (Signed)
Reviewed. Case discussed with GI nurse practitioner

## 2017-01-24 NOTE — Telephone Encounter (Signed)
I do not see that in Dr. Blanch Media note. He did suggest that she be taken to the ER where she could be admitted and be seen by GI and General surgery if it was deemed fit.    If Dr. Henrene Pastor believes that she needs a general surgery consult he will be the one to to enter the order, but she will have to be seen by him first

## 2017-01-24 NOTE — Telephone Encounter (Signed)
Please advise 

## 2017-01-24 NOTE — Telephone Encounter (Signed)
Patient seen once by GI nurse practitioner. I have not seen the patient will review the note and discussed the case with the nurse practitioner. She is old, has dementia, and a large thyroid mass (which is not something GI handles) which may be affecting her swallowing. Since she is failing to thrive, probably best that they take her to the hospital where she could be admitted by the hospitalist service subsequent specialty consultation (GI, ENT, Gen. surgery etc.).

## 2017-01-24 NOTE — Telephone Encounter (Signed)
Pt is having trouble swallowing and can not see dr Henrene Pastor until  02-16-17 for endocopy. Per pt daughter in meantime dr Henrene Pastor would like the pt to be sch to see general surgery for mass in her throat. Pt contact pt daughter terri with the appointment.

## 2017-01-26 NOTE — Telephone Encounter (Signed)
Spoke with pt's daughter, [name]. Advised her to contact Dr. Ronnie Derby office if they are seeking surgical consult to remove thyroid mass. She voiced understanding and will call his office. Nothing further needed at this time.

## 2017-01-30 ENCOUNTER — Telehealth: Payer: Self-pay | Admitting: *Deleted

## 2017-01-30 ENCOUNTER — Other Ambulatory Visit: Payer: Self-pay | Admitting: Endocrinology

## 2017-01-30 ENCOUNTER — Telehealth: Payer: Self-pay | Admitting: Adult Health

## 2017-01-30 ENCOUNTER — Telehealth: Payer: Self-pay

## 2017-01-30 DIAGNOSIS — E041 Nontoxic single thyroid nodule: Secondary | ICD-10-CM

## 2017-01-30 NOTE — Telephone Encounter (Signed)
I have reviewed the assessment from the gastroenterologist and it may be better to wait until she has her endoscopy to see the surgeon

## 2017-01-30 NOTE — Telephone Encounter (Signed)
[facility] faxed a refill request for Vit D2 1.25mg  (50000 units)-take 1 capsule by mouth once a week-#4. I did not see where this prescribed by [facility] for the pt.  Message sent to [facility].

## 2017-01-30 NOTE — Telephone Encounter (Signed)
Daughter [name] calling b/c Dr. Dorothyann Peng recommended that the patient get a referral to a surgeon RE 4.6 mass pushing on esophagus in relation to thyroid.  Please call to discuss.  Thank you,  -LL

## 2017-01-30 NOTE — Telephone Encounter (Signed)
Patients states that her mother is miserable and she would like for you to send the surgeon referral now so that she can get the process started. Daughter states she already knows the mass is pushing on her mothers esophagus and she is constantly grabbing the left side of her throat even if she is not eating or drinking and has a continuous cough and has had pneumonia twice- the endoscopy is scheduled for 02/16/17 and the daughter would like to be able to make the surgeon appointment shortly after that so she needs referral now please advise

## 2017-01-30 NOTE — Telephone Encounter (Signed)
[place] Patient Name: Anna [name] Gender: Female DOB: 1928-12-10 Age: 83 Y 2 M 29 D Return Phone Number: [phone] (Primary) City/State/Zip: [place] [address] Client [facility] Night - Client Client Site [facility] - Night Physician Nafziger, Tommi Rumps - NP Who Is Calling Patient / Member / Family / Caregiver Call Type Triage / Clinical Caller Name [name] Relationship To Patient Daughter Return Phone Number [phone] (Primary) Chief Complaint Fever (non urgent symptom) (> THREE MONTHS) Reason for Call Symptomatic / Request for Health Information Initial Comment caller states mother might have pneumonia, her heart rate is up after medication. she has a fever of 99.8. she is coughing and they would like an rx called in to hold her over for the weekend. No Triage Reason Patient declined

## 2017-01-30 NOTE — Telephone Encounter (Signed)
Please advise 

## 2017-01-30 NOTE — Telephone Encounter (Signed)
Has been completed

## 2017-01-31 ENCOUNTER — Other Ambulatory Visit: Payer: Self-pay | Admitting: Adult Health

## 2017-01-31 ENCOUNTER — Other Ambulatory Visit (INDEPENDENT_AMBULATORY_CARE_PROVIDER_SITE_OTHER): Payer: Medicare HMO

## 2017-01-31 ENCOUNTER — Other Ambulatory Visit: Payer: Self-pay

## 2017-01-31 DIAGNOSIS — R300 Dysuria: Secondary | ICD-10-CM | POA: Diagnosis not present

## 2017-01-31 LAB — POC URINALSYSI DIPSTICK (AUTOMATED)
Bilirubin, UA: NEGATIVE
Blood, UA: NEGATIVE
GLUCOSE UA: NEGATIVE
KETONES UA: NEGATIVE
Nitrite, UA: POSITIVE
Protein, UA: NEGATIVE
Urobilinogen, UA: 0.2 E.U./dL
pH, UA: 6 (ref 5.0–8.0)

## 2017-01-31 MED ORDER — VITAMIN D (ERGOCALCIFEROL) 1.25 MG (50000 UNIT) PO CAPS: 50000.0000 [IU] | ORAL_CAPSULE | ORAL | 1 refills | Status: DC

## 2017-01-31 MED ORDER — CIPROFLOXACIN HCL 500 MG PO TABS
500.0000 mg | ORAL_TABLET | Freq: Two times a day (BID) | ORAL | 0 refills | Status: DC
Start: 2017-01-31 — End: 2017-03-22

## 2017-01-31 NOTE — Telephone Encounter (Signed)
Spoke with pt's daughter. She states that pt does not have fever. She does have back pain, cough and malodorous urine. She would like to know if it would be ok to bring in urine sample for testing/treatment and not have to bring pt in, as it is hard to get her out of the house.  Cory - Please advise. Thanks!

## 2017-01-31 NOTE — Telephone Encounter (Signed)
She is going to have to come by and get a sterile cup

## 2017-01-31 NOTE — Telephone Encounter (Signed)
Ok to refill 

## 2017-01-31 NOTE — Telephone Encounter (Signed)
Rx has been refilled as directed.

## 2017-01-31 NOTE — Telephone Encounter (Signed)
Spoke with pt's daughter, [name], and advised. She has a hat and sterile cups at home. She will keep sample refrigerated until brings to office. Nothing further needed at this time.

## 2017-02-02 LAB — URINE CULTURE

## 2017-02-03 ENCOUNTER — Other Ambulatory Visit: Payer: Self-pay | Admitting: Adult Health

## 2017-02-03 MED ORDER — NITROFURANTOIN MONOHYD MACRO 100 MG PO CAPS: 100.0000 mg | ORAL_CAPSULE | Freq: Two times a day (BID) | ORAL | 0 refills | Status: DC

## 2017-02-07 ENCOUNTER — Telehealth: Payer: Self-pay | Admitting: Adult Health

## 2017-02-07 NOTE — Telephone Encounter (Signed)
Please advise 

## 2017-02-07 NOTE — Telephone Encounter (Signed)
I am sorry, but I cannot order imaging without a office visit

## 2017-02-07 NOTE — Telephone Encounter (Signed)
Pt has some congestion, doesn't fell well.  Daughter would like to know if pt can get a chest xray. Daughter states cory aware of pt's health issues and declined appt at this time.

## 2017-02-07 NOTE — Telephone Encounter (Signed)
Noted  

## 2017-02-07 NOTE — Telephone Encounter (Signed)
Pt has been scheduled.  °

## 2017-02-08 ENCOUNTER — Ambulatory Visit (INDEPENDENT_AMBULATORY_CARE_PROVIDER_SITE_OTHER): Payer: Medicare HMO | Admitting: Adult Health

## 2017-02-08 ENCOUNTER — Ambulatory Visit (INDEPENDENT_AMBULATORY_CARE_PROVIDER_SITE_OTHER)
Admission: RE | Admit: 2017-02-08 | Discharge: 2017-02-08 | Disposition: A | Discharge status: Home / Self Care | Payer: Medicare HMO | Source: Ambulatory Visit | Attending: Adult Health | Admitting: Adult Health

## 2017-02-08 ENCOUNTER — Encounter: Payer: Self-pay | Admitting: Adult Health

## 2017-02-08 VITALS — BP 120/78 | Temp 98.1°F

## 2017-02-08 DIAGNOSIS — R059 Cough, unspecified: Secondary | ICD-10-CM

## 2017-02-08 DIAGNOSIS — R05 Cough: Secondary | ICD-10-CM

## 2017-02-08 LAB — CBC WITH DIFFERENTIAL/PLATELET
BASOS ABS: 0.1 10*3/uL (ref 0.0–0.1)
BASOS PCT: 0.9 % (ref 0.0–3.0)
EOS ABS: 0.2 10*3/uL (ref 0.0–0.7)
Eosinophils Relative: 2.5 % (ref 0.0–5.0)
HEMATOCRIT: 38.5 % (ref 36.0–46.0)
HEMOGLOBIN: 12.1 g/dL (ref 12.0–15.0)
LYMPHS PCT: 18.7 % (ref 12.0–46.0)
Lymphs Abs: 1.2 10*3/uL (ref 0.7–4.0)
MCHC: 31.6 g/dL (ref 30.0–36.0)
MCV: 81.9 fl (ref 78.0–100.0)
MONOS PCT: 8.2 % (ref 3.0–12.0)
Monocytes Absolute: 0.5 10*3/uL (ref 0.1–1.0)
Neutro Abs: 4.5 10*3/uL (ref 1.4–7.7)
Neutrophils Relative %: 69.7 % (ref 43.0–77.0)
Platelets: 189 10*3/uL (ref 150.0–400.0)
RBC: 4.7 Mil/uL (ref 3.87–5.11)
RDW: 24.7 % — AB (ref 11.5–15.5)
WBC: 6.4 10*3/uL (ref 4.0–10.5)

## 2017-02-08 LAB — BASIC METABOLIC PANEL
BUN: 18 mg/dL (ref 6–23)
CHLORIDE: 108 meq/L (ref 96–112)
CO2: 24 mEq/L (ref 19–32)
CREATININE: 0.97 mg/dL (ref 0.40–1.20)
Calcium: 9.5 mg/dL (ref 8.4–10.5)
GFR: 57.84 mL/min — ABNORMAL LOW (ref >60.00)
GLUCOSE: 164 mg/dL — AB (ref 70–99)
POTASSIUM: 3.9 meq/L (ref 3.5–5.1)
Sodium: 141 mEq/L (ref 135–145)

## 2017-02-08 NOTE — Progress Notes (Signed)
Subjective:    Patient ID: Anna [name], female    DOB: 11-15-1928, 83 y.o.   MRN: [id number]  HPI  83 year old female who  has a past medical history of Alzheimer's disease; Atrial fibrillation ([place]); Bronchitis; CHF (congestive heart failure) ([place]); Chronic renal insufficiency, stage III (moderate); Diabetes mellitus without complication ([place]); GERD (gastroesophageal reflux disease); GI bleeding (11/2013); Hyperlipidemia; Hypertension; Hyperthyroidism; LBBB (left bundle branch block); LBBB (left bundle branch block); Overactive bladder; Overweight; Persistent atrial fibrillation ([place]); Pneumonia; Vitamin B12 deficiency; and Vitamin D deficiency. She presents to the office today with her daughter who gives the entire history.   Per daughter, for the last week Anna [name] has had a non productive cough, fatigue, and low grade fever up to 7F. Family report " she has not been bring anything up with the cough, but we hear wheezing and rattling when she coughs. She has also been sleeping for the last three days. "   Family is worried that she may have pneumonia again.   She is currently being treated for a UTI with Macrobid, she was resistant to many oral antibiotics   Review of Systems  Unable to perform ROS: Dementia   See HPI   Past Medical History:  Diagnosis Date  . Alzheimer's disease   . Atrial fibrillation ([place])   . Bronchitis   . CHF (congestive heart failure) ([place])   . Chronic renal insufficiency, stage III (moderate)   . Diabetes mellitus without complication ([place])   . GERD (gastroesophageal reflux disease)   . GI bleeding 11/2013   due to supratherapeutic INR  . Hyperlipidemia   . Hypertension   . Hyperthyroidism    on amiodarone  . LBBB (left bundle branch block)   . LBBB (left bundle branch block)   . Overactive bladder   . Overweight   . Persistent atrial fibrillation ([place])   . Pneumonia   . Vitamin B12 deficiency   . Vitamin D deficiency     Social History    Social History  . Marital status: Divorced    Spouse name: N/A  . Number of children: N/A  . Years of education: N/A   Occupational History  . Not on file.   Social History Main Topics  . Smoking status: Never Smoker  . Smokeless tobacco: Never Used  . Alcohol use No  . Drug use: No  . Sexual activity: Not on file   Other Topics Concern  . Not on file   Social History Narrative   Pt recently moved to [place] with daughter from [place]       Past Surgical History:  Procedure Laterality Date  . ABDOMINAL HYSTERECTOMY      Family History  Problem Relation Age of Onset  . Heart disease Father   . Diabetes Mother   . Thyroid disease Neg Hx     Allergies  Allergen Reactions  . Pollen Extract     Sneezing, watery eyes    Current Outpatient Prescriptions on File Prior to Visit  Medication Sig Dispense Refill  . ACCU-CHEK AVIVA PLUS test strip PATIENT TO CHECK SUGARS ONCE DAILY.  3  . acetaminophen (TYLENOL) 500 MG tablet Take 500 mg by mouth every 6 (six) hours as needed for fever.     Marland Kitchen aspirin 325 MG tablet Take 325 mg by mouth daily.    . calcitRIOL (ROCALTROL) 0.5 MCG capsule Take 1 capsule (0.5 mcg total) by mouth 3 (three) times a week. Monday, Wednesday, and Friday  only 36 capsule 3  . carbamide peroxide (DEBROX) 6.5 % otic solution Place 5 drops into both ears 2 (two) times daily. 15 mL 0  . ciprofloxacin (CIPRO) 500 MG tablet Take 1 tablet (500 mg total) by mouth 2 (two) times daily. 6 tablet 0  . docusate sodium (COLACE) 100 MG capsule Take 100 mg by mouth daily as needed for mild constipation.    Marland Kitchen donepezil (ARICEPT) 10 MG tablet TAKE 1 TABLET ONCE DAILY. 90 tablet 3  . fluticasone (FLONASE) 50 MCG/ACT nasal spray Place 2 sprays into both nostrils daily.    . furosemide (LASIX) 20 MG tablet Take 20 mg by mouth daily. Take an extra 0.5 tablet daily as needed.    Marland Kitchen glimepiride (AMARYL) 2 MG tablet Take 2 mg by mouth daily with breakfast.    . guaiFENesin  (MUCINEX) 600 MG 12 hr tablet Take 600 mg by mouth 2 (two) times daily as needed for cough. Reported on 12/22/2015    . Iron-FA-B Cmp-C-Biot-Probiotic (FUSION PLUS) CAPS Take 1 tablet by mouth daily. 90 capsule 3  . loratadine (ALAVERT) 10 MG tablet Take 5 mg by mouth daily. Every 2 days     . metoprolol tartrate (LOPRESSOR) 25 MG tablet Take 1 tablet (25 mg total) by mouth 2 (two) times daily. 60 tablet 2  . nitrofurantoin, macrocrystal-monohydrate, (MACROBID) 100 MG capsule Take 1 capsule (100 mg total) by mouth 2 (two) times daily. 10 capsule 0  . omeprazole (PRILOSEC) 40 MG capsule Take 30- 60 min before your first and last meals of the day 60 capsule 11  . Polyethyl Glycol-Propyl Glycol (SYSTANE) 0.4-0.3 % SOLN Apply 1 drop to eye 2 (two) times daily as needed (dry eyes).    . pravastatin (PRAVACHOL) 40 MG tablet Take 40 mg by mouth daily.    . vitamin B-12 (CYANOCOBALAMIN) 500 MCG tablet Take 500 mcg by mouth daily.    . Vitamin D, Ergocalciferol, (DRISDOL) 50000 units CAPS capsule Take 1 capsule (50,000 Units total) by mouth once a week. Mondays 4 capsule 1   No current facility-administered medications on file prior to visit.     BP 120/78 (BP Location: Left Arm, Patient Position: Sitting, Cuff Size: Normal)   Temp 98.1 F (36.7 C) (Oral)       Objective:   Physical Exam  Constitutional: She is oriented to person, place, and time. She appears well-developed and well-nourished. No distress.  Cardiovascular: Normal rate, regular rhythm, normal heart sounds and intact distal pulses.  Exam reveals no gallop and no friction rub.   No murmur heard. Pulmonary/Chest: Effort normal and breath sounds normal. No respiratory distress. She has no wheezes. She has no rales. She exhibits no tenderness.  No wheezing or rhonchi heard. Due to patients cognitive decline she is unable to follow directions well and it was difficult to have her take deep breaths. Possible decreased breath sounds at bases.    Neurological: She is alert and oriented to person, place, and time.  Skin: Skin is warm and dry. No rash noted. She is not diaphoretic. No erythema. No pallor.  Psychiatric: She has a normal mood and affect. Her behavior is normal. Judgment and thought content normal.  Nursing note and vitals reviewed.     Assessment & Plan:  1. Cough - Possible pneumonia? Will get CBC and chest x ray to r/o infectious process  - CBC with Differential/Platelet - Basic metabolic panel - DG Chest 2 View; Future  Dorothyann Peng, NP

## 2017-02-09 ENCOUNTER — Telehealth: Payer: Self-pay | Admitting: Cardiovascular Disease

## 2017-02-09 NOTE — Telephone Encounter (Signed)
New message       Pt c/o Shortness Of Breath: STAT if SOB developed within the last 24 hours or pt is noticeably SOB on the phone  1. Are you currently SOB (can you hear that pt is SOB on the phone)?    2. How long have you been experiencing SOB?  Started about 3 days ago  3. Are you SOB when sitting or when up moving around?  sitting  4. Are you currently experiencing any other symptoms?  Pt saw her PCP yesterday.  Chest xray confirms pulmonary edema.  Patient HR is between 20-102.  Daughter gave pt an extra 10mg  of lasix.  Daughter states that pt has little wheezing sound.  Can pt have extra lasix today?  Can Dr Oval Linsey pull the xray and look at it?  Please call and give instructions on fluid intake and lasix dosage.  Daughter request a call as soon as someone can please.

## 2017-02-09 NOTE — Telephone Encounter (Signed)
Spoke with pt dtr, she was seen by the PCP yesterday and the cxr that was done showed pulmonary edema. She gave the patient an extra 10 mg of furosemide last night. During the night the patient had a lot of urine output and also was quite wet this morning when changed. During the night the dtr moved the patient to the recliner from the bed and her breathing improved. This morning she is breathing better but she still has a little wheezing and her leg is swollen. Advised the dtr to give her 30 mg of the furosemide today to help get all the extra fluid out. She will continue to monitor and let us know of any other issues.

## 2017-02-11 ENCOUNTER — Other Ambulatory Visit (HOSPITAL_COMMUNITY)
Admission: RE | Admit: 2017-02-11 | Discharge: 2017-02-11 | Disposition: A | Discharge status: Home / Self Care | Payer: Medicare HMO | Source: Other Acute Inpatient Hospital | Attending: Family Medicine | Admitting: Family Medicine

## 2017-02-11 ENCOUNTER — Ambulatory Visit (INDEPENDENT_AMBULATORY_CARE_PROVIDER_SITE_OTHER): Payer: Medicare HMO | Admitting: Family Medicine

## 2017-02-11 VITALS — BP 134/78 | HR 69 | Temp 97.7°F

## 2017-02-11 DIAGNOSIS — R8290 Unspecified abnormal findings in urine: Secondary | ICD-10-CM

## 2017-02-11 LAB — POC URINALSYSI DIPSTICK (AUTOMATED)
Bilirubin, UA: NEGATIVE
Blood, UA: NEGATIVE
GLUCOSE UA: NEGATIVE
KETONES UA: NEGATIVE
Leukocytes, UA: NEGATIVE
Nitrite, UA: NEGATIVE
Protein, UA: NEGATIVE
SPEC GRAV UA: 1.02 (ref 1.010–1.025)
Urobilinogen, UA: 0.2 E.U./dL
pH, UA: 6 (ref 5.0–8.0)

## 2017-02-11 NOTE — Progress Notes (Signed)
  Tommi Rumps, MD Phone: [phone]  Anna [name] is a 83 y.o. female who presents today for same-day visit.  Patient presents with her daughter who provides most of the history. She reports the patient has dementia and is pretty much nonverbal. She reports she has not been feeling well. Has had a slight backache today. Her urine was darker than typical yesterday. They're worried about a UTI. They did a first morning catch today and it revealed some sediment in her urine. No history of kidney stones. She was recently treated with Macrobid for UTI. She's had no fevers. Her daughter reports she is acting at her baseline for when she feels poorly. They've not noted any abdominal pain or numbness or weakness. The daughter also reports that the patient's coccyx has been uncomfortable. She's had an area where there has been a callus as well that they're using barrier cream for.  ROS see history of present illness  Objective  Physical Exam Vitals:   02/11/17 1228  BP: 134/78  Pulse: 69  Temp: 97.7 F (36.5 C)    BP Readings from Last 3 Encounters:  02/11/17 134/78  02/08/17 120/78  01/20/17 136/68   Wt Readings from Last 3 Encounters:  12/30/16 195 lb (88.5 kg)  10/03/16 201 lb (91.2 kg)  09/27/16 201 lb (91.2 kg)    Physical Exam  Constitutional: No distress.  Cardiovascular: Normal rate, regular rhythm and normal heart sounds.   Pulmonary/Chest: Effort normal and breath sounds normal.  Abdominal: Soft. Bowel sounds are normal. She exhibits no distension. There is no tenderness. There is no rebound and no guarding.  Musculoskeletal:  No midline spine tenderness, no midline spine step-off, there is mild muscular tenderness in the lumbar spine on the left side, no overlying skin changes, examination of the sacrum and coccyx area does not reveal any skin breakdown or erythema  Neurological: She is alert.  5/5 strength bilateral quads, hamstrings, plantar flexion, and  dorsiflexion, sensation to light touch intact bilaterally lower extremities  Skin: Skin is warm and dry. She is not diaphoretic.     Assessment/Plan: Please see individual problem list.  Abnormal urine sediment Patient's daughter reports abnormal urine sediment on gross visualization of patient urine. UA today is unremarkable. She's afebrile. She does have some mild muscular tenderness of her back and thus might have strained a muscle in her back. Benign abdominal exam. Benign lower extremity neurological exam. Discussed sending urine for culture. Continuing to monitor. If she develops any additional symptoms she'll be reevaluated.   Orders Placed This Encounter  Procedures  . Urine Culture  . POCT Urinalysis Dipstick (Automated)   Tommi Rumps, MD [place]

## 2017-02-11 NOTE — Assessment & Plan Note (Signed)
Patient's daughter reports abnormal urine sediment on gross visualization of patient urine. UA today is unremarkable. She's afebrile. She does have some mild muscular tenderness of her back and thus might have strained a muscle in her back. Benign abdominal exam. Benign lower extremity neurological exam. Discussed sending urine for culture. Continuing to monitor. If she develops any additional symptoms she'll be reevaluated.

## 2017-02-11 NOTE — Patient Instructions (Signed)
Your urine tested normal. We are going to send it for a culture just to confirm that there is no infection.  If you develop fevers or any worsening back pain or any new or change in symptoms please seek medical attention.

## 2017-02-13 ENCOUNTER — Other Ambulatory Visit: Payer: Self-pay | Admitting: Family Medicine

## 2017-02-13 LAB — URINE CULTURE

## 2017-02-13 MED ORDER — NITROFURANTOIN MONOHYD MACRO 100 MG PO CAPS: 100.0000 mg | ORAL_CAPSULE | Freq: Two times a day (BID) | ORAL | 0 refills | Status: DC

## 2017-02-15 ENCOUNTER — Encounter (HOSPITAL_COMMUNITY): Payer: Self-pay | Admitting: *Deleted

## 2017-02-15 NOTE — Progress Notes (Signed)
Anesthesia Chart Review:  Patient is a same day work up  Pt is an 83 year old female scheduled for EGD on 02/16/2017 with Scarlette Shorts, MD  - PCP is Dorothyann Peng, NP - Cardiologist is Skeet Latch, MD, last office visit 12/05/16  PMH includes:  Chronic CHF, persistent atrial fibrillation (not on anticoagulation due to fall risk and prior GI bleed), LBBB, HTN, DM, hyperlipidemia, CKD (stage III), Alzheimer's disease (pt is essentially nonverbal), recurrent pneumonia (suspected aspiration pneumonia), GERD. Never smoker. Pt currently being treated for UTI.   Medications include: ASA 325 mg, Aricept, Cipro, Lasix, glimepiride, iron, metoprolol, Macrobid, Prilosec, pravastatin  Labs 02/08/17 at PCP's office reviewed.  CBC and BMET acceptable for procedure.    CXR 02/08/17: Mild cardiomegaly and pulmonary edema.  EKG 12/30/16: Atrial fibrillation (64 bpm). Left bundle branch block  Echo 09/16/16:  - Left ventricle: The cavity size was normal. Wall thickness was normal. Systolic function was moderately reduced. The estimated ejection fraction was in the range of 35% to 40%. Moderate diffuse hypokinesis with no identifiable regional variations. Acoustic contrast opacification revealed no evidence ofthrombus. - Ventricular septum: Septal motion showed abnormal function, dyssynergy, and paradox. These changes are consistent with a left bundle branch block. - Left atrium: The atrium was mildly dilated.  If no changes, I anticipate pt can proceed with surgery as scheduled.   Willeen Cass, FNP-BC [facility]/Anesthesiology Phone: [phone] 02/15/2017 1:28 PM

## 2017-02-15 NOTE — Progress Notes (Signed)
SDW- pre-op call completed by pt daughter Anna [name] [facility]). Daughter denies that pt C/O SOB and chest [name]. Pt under the care of Dr. Skeet Latch, Cardiology. Daughter denies that pt had a stress test and cardiac cath. Daughter stated that she will bring pt AM medications with her. Daughter made aware to have pt stop taking vitamins, fish oil and herbal medications. Do not take any NSAIDs ie: Ibuprofen, Advil, Naproxen ( Aleve) , Motrin, BC and Goody Powder. Daughter made aware to have pt hold Glimiperide on DOS. Pt made aware to check BG every 2 hours prior to arrival to hospital on DOS. Pt made aware to treat a BG < 70 with 4 glucose tabs, wait 15 minutes after intervention to recheck BG, if BG remains < 70, call the Endo unit at [phone] unit to speak with a nurse. Daughter verbalized understanding of all pre-op instructions. Anesthesia asked to review pt history ( see note).

## 2017-02-16 ENCOUNTER — Ambulatory Visit (HOSPITAL_COMMUNITY): Payer: Medicare HMO | Admitting: Emergency Medicine

## 2017-02-16 ENCOUNTER — Encounter (HOSPITAL_COMMUNITY)
Admission: RE | Disposition: A | Discharge status: Home / Self Care | Payer: Self-pay | Source: Ambulatory Visit | Attending: Internal Medicine

## 2017-02-16 ENCOUNTER — Ambulatory Visit (HOSPITAL_COMMUNITY)
Admission: RE | Admit: 2017-02-16 | Discharge: 2017-02-16 | Disposition: A | Discharge status: Home / Self Care | Payer: Medicare HMO | Source: Ambulatory Visit | Attending: Internal Medicine | Admitting: Internal Medicine

## 2017-02-16 ENCOUNTER — Encounter (HOSPITAL_COMMUNITY): Payer: Self-pay | Admitting: Internal Medicine

## 2017-02-16 DIAGNOSIS — E785 Hyperlipidemia, unspecified: Secondary | ICD-10-CM | POA: Diagnosis not present

## 2017-02-16 DIAGNOSIS — Z79899 Other long term (current) drug therapy: Secondary | ICD-10-CM | POA: Diagnosis not present

## 2017-02-16 DIAGNOSIS — I130 Hypertensive heart and chronic kidney disease with heart failure and stage 1 through stage 4 chronic kidney disease, or unspecified chronic kidney disease: Secondary | ICD-10-CM | POA: Diagnosis not present

## 2017-02-16 DIAGNOSIS — R1310 Dysphagia, unspecified: Secondary | ICD-10-CM

## 2017-02-16 DIAGNOSIS — N3281 Overactive bladder: Secondary | ICD-10-CM | POA: Insufficient documentation

## 2017-02-16 DIAGNOSIS — I447 Left bundle-branch block, unspecified: Secondary | ICD-10-CM | POA: Insufficient documentation

## 2017-02-16 DIAGNOSIS — Z7982 Long term (current) use of aspirin: Secondary | ICD-10-CM | POA: Diagnosis not present

## 2017-02-16 DIAGNOSIS — K219 Gastro-esophageal reflux disease without esophagitis: Secondary | ICD-10-CM | POA: Diagnosis not present

## 2017-02-16 DIAGNOSIS — E663 Overweight: Secondary | ICD-10-CM | POA: Insufficient documentation

## 2017-02-16 DIAGNOSIS — R1312 Dysphagia, oropharyngeal phase: Secondary | ICD-10-CM | POA: Insufficient documentation

## 2017-02-16 DIAGNOSIS — J811 Chronic pulmonary edema: Secondary | ICD-10-CM | POA: Diagnosis not present

## 2017-02-16 DIAGNOSIS — E538 Deficiency of other specified B group vitamins: Secondary | ICD-10-CM | POA: Insufficient documentation

## 2017-02-16 DIAGNOSIS — Z7984 Long term (current) use of oral hypoglycemic drugs: Secondary | ICD-10-CM | POA: Diagnosis not present

## 2017-02-16 DIAGNOSIS — G309 Alzheimer's disease, unspecified: Secondary | ICD-10-CM | POA: Insufficient documentation

## 2017-02-16 DIAGNOSIS — N183 Chronic kidney disease, stage 3 (moderate): Secondary | ICD-10-CM | POA: Diagnosis not present

## 2017-02-16 DIAGNOSIS — E1122 Type 2 diabetes mellitus with diabetic chronic kidney disease: Secondary | ICD-10-CM | POA: Insufficient documentation

## 2017-02-16 DIAGNOSIS — I4891 Unspecified atrial fibrillation: Secondary | ICD-10-CM | POA: Diagnosis not present

## 2017-02-16 DIAGNOSIS — E559 Vitamin D deficiency, unspecified: Secondary | ICD-10-CM | POA: Diagnosis not present

## 2017-02-16 DIAGNOSIS — I481 Persistent atrial fibrillation: Secondary | ICD-10-CM | POA: Diagnosis not present

## 2017-02-16 DIAGNOSIS — I5033 Acute on chronic diastolic (congestive) heart failure: Secondary | ICD-10-CM | POA: Diagnosis not present

## 2017-02-16 DIAGNOSIS — F0280 Dementia in other diseases classified elsewhere without behavioral disturbance: Secondary | ICD-10-CM | POA: Diagnosis not present

## 2017-02-16 DIAGNOSIS — E0590 Thyrotoxicosis, unspecified without thyrotoxic crisis or storm: Secondary | ICD-10-CM | POA: Insufficient documentation

## 2017-02-16 DIAGNOSIS — Z6828 Body mass index (BMI) 28.0-28.9, adult: Secondary | ICD-10-CM | POA: Insufficient documentation

## 2017-02-16 DIAGNOSIS — I509 Heart failure, unspecified: Secondary | ICD-10-CM | POA: Insufficient documentation

## 2017-02-16 HISTORY — DX: Dyspnea, unspecified: R06.00

## 2017-02-16 HISTORY — DX: Urinary tract infection, site not specified: N39.0

## 2017-02-16 HISTORY — PX: ESOPHAGOGASTRODUODENOSCOPY (EGD) WITH PROPOFOL: SHX5813

## 2017-02-16 HISTORY — DX: Disorder of thyroid, unspecified: E07.9

## 2017-02-16 SURGERY — ESOPHAGOGASTRODUODENOSCOPY (EGD) WITH PROPOFOL
Anesthesia: Monitor Anesthesia Care

## 2017-02-16 MED ORDER — PROPOFOL 10 MG/ML IV BOLUS
INTRAVENOUS | Status: DC | PRN
  Administered 2017-02-16: 20 mg via INTRAVENOUS

## 2017-02-16 MED ORDER — LACTATED RINGERS IV SOLN
INTRAVENOUS | Status: DC | PRN
  Administered 2017-02-16: 10:00:00 via INTRAVENOUS

## 2017-02-16 MED ORDER — LACTATED RINGERS IV SOLN
INTRAVENOUS | Status: DC
  Administered 2017-02-16: 1000 mL via INTRAVENOUS

## 2017-02-16 MED ORDER — PROPOFOL 500 MG/50ML IV EMUL
INTRAVENOUS | Status: DC | PRN
  Administered 2017-02-16: 75 ug/kg/min via INTRAVENOUS

## 2017-02-16 MED ORDER — SODIUM CHLORIDE 0.9 % IV SOLN
INTRAVENOUS | Status: DC
Start: 2017-02-16 — End: 2017-02-16

## 2017-02-16 SURGICAL SUPPLY — 14 items

## 2017-02-16 NOTE — H&P (Signed)
HPI: Patient is an 83 year old female, referred by PCP Dorothyann Peng, NP for dysphagia. Patient has Alzheimer's, she is unable to provide history but accompanied by her daughter [name] with whom she resides. [name], as well as her husband and her son are all patients of Dr. Henrene Pastor. Patient wuld like her mother to be under the care of Dr. Henrene Pastor as well.  Ms. Mcclurkin has a history of recurrent pneumonia/bronchitis. In the process of evaluation patient had a CT of the chest which showed a 4 cm left thyroid mass . She was evaluated by Endocrinology, biopsy was negative for malignancy . Daughter does not yet know what the plans are for this mass. Patient has been on several antibiotics as well as prednisone for recurrent PNA and bronchitis. There is a question of whether the recurrent pneumonia is secondary to aspiration. Patient recently had an esophagram which showed silent aspiration of barium. The upper thoracic esophagus could not be evaluated because the procedure was terminated due to aspiration. There was a mild mass effect upon the upper cervical esophagus and trachea by the known mediastinal mass. In retrospect, daughter now recalls mother grabbing her throat when eating or even throughout the day over the last several months.  She times coughs during meals but the most part is able to get down solid food without difficulty.        Past Medical History:  Diagnosis Date  . Alzheimer's disease   . Atrial fibrillation ([place])   . Bronchitis   . CHF (congestive heart failure) ([place])   . Chronic renal insufficiency, stage III (moderate)   . Diabetes mellitus without complication ([place])   . GERD (gastroesophageal reflux disease)   . GI bleeding 11/2013   due to supratherapeutic INR  . Hyperlipidemia   . Hypertension   . Hyperthyroidism    on amiodarone  . LBBB (left bundle branch block)   . LBBB (left bundle branch block)   . Overactive bladder   . Overweight   . Persistent  atrial fibrillation ([place])   . Pneumonia   . Vitamin B12 deficiency   . Vitamin D deficiency           Past Surgical History:  Procedure Laterality Date  . ABDOMINAL HYSTERECTOMY          Family History  Problem Relation Age of Onset  . Heart disease Father   . Diabetes Mother   . Thyroid disease Neg Hx        Social History  Substance Use Topics  . Smoking status: Never Smoker  . Smokeless tobacco: Never Used  . Alcohol use No         Current Outpatient Prescriptions  Medication Sig Dispense Refill  . ACCU-CHEK AVIVA PLUS test strip PATIENT TO CHECK SUGARS ONCE DAILY.  3  . acetaminophen (TYLENOL) 500 MG tablet Take 500 mg by mouth every 6 (six) hours as needed for fever.     Marland Kitchen aspirin 325 MG tablet Take 325 mg by mouth daily.    . calcitRIOL (ROCALTROL) 0.5 MCG capsule Take 1 capsule (0.5 mcg total) by mouth 3 (three) times a week. Monday, Wednesday, and Friday only 36 capsule 3  . carbamide peroxide (DEBROX) 6.5 % otic solution Place 5 drops into both ears 2 (two) times daily. 15 mL 0  . docusate sodium (COLACE) 100 MG capsule Take 100 mg by mouth daily as needed for mild constipation.    Marland Kitchen donepezil (ARICEPT) 10 MG tablet TAKE 1 TABLET ONCE  DAILY. 90 tablet 3  . fluticasone (FLONASE) 50 MCG/ACT nasal spray Place 2 sprays into both nostrils daily.    . furosemide (LASIX) 20 MG tablet Take 20 mg by mouth daily. Take an extra 0.5 tablet daily as needed.    Marland Kitchen glimepiride (AMARYL) 2 MG tablet Take 2 mg by mouth daily with breakfast.    . guaiFENesin (MUCINEX) 600 MG 12 hr tablet Take 600 mg by mouth 2 (two) times daily as needed for cough. Reported on 12/22/2015    . Iron-FA-B Cmp-C-Biot-Probiotic (FUSION PLUS) CAPS Take 1 tablet by mouth daily. 90 capsule 3  . loratadine (ALAVERT) 10 MG tablet Take 5 mg by mouth daily. Every 2 days     . metoprolol tartrate (LOPRESSOR) 25 MG tablet Take 1 tablet (25 mg total) by mouth 2 (two) times daily. 60  tablet 2  . omeprazole (PRILOSEC) 40 MG capsule Take 30- 60 min before your first and last meals of the day 60 capsule 11  . Polyethyl Glycol-Propyl Glycol (SYSTANE) 0.4-0.3 % SOLN Apply 1 drop to eye 2 (two) times daily as needed (dry eyes).    . pravastatin (PRAVACHOL) 40 MG tablet Take 40 mg by mouth daily.    . vitamin B-12 (CYANOCOBALAMIN) 500 MCG tablet Take 500 mcg by mouth daily.    . Vitamin D, Ergocalciferol, (DRISDOL) 50000 UNITS CAPS capsule Take 50,000 Units by mouth once a week. Mondays     No current facility-administered medications for this visit.         Allergies  Allergen Reactions  . Pollen Extract     Sneezing, watery eyes     Review of Systems: Unobtainable. Patient has Alzheimer's disease.    Physical Exam: BP 136/68   Pulse 80  Constitutional:  Overweight white female in a wheelchair in no acute distress. Psychiatric:  Pleasant, tries to be cooperative. . Behavior is normal. EENT: Pupils normal.  Conjunctivae are normal. No scleral icterus. Not evaluating oral cavity, patient would not adequately open her mouth Neck supple.  Cardiovascular: Normal rate, regular rhythm.  Pulmonary/chest: Effort normal and breath sounds normal. No wheezing, rales or rhonchi. Abdominal: limited exam in chair. Soft, obese, nontender, normal bowel sounds. . Lymphadenopathy: No cervical adenopathy noted. Skin: Skin is warm and dry. No rashes noted.   ASSESSMENT AND PLAN:  1. Pleasant 83 year old female with Alzheimer's disease here with recurrent episodes of pneumonia, possibly aspiration related. Esophagram shows mild extrinsic compression of cervical esophagus from known left thyroid mass (non-malignant). Silent reflux demonstrated on the esophagram (in supine position).  Per daughter, patient tolerates most solids. She sometimes coughs during meals and frequently grabs at her throat but the food always goes down with little effort. .  -The mild  narrowing of the cervical esophagus may be enough to cause patient's symptoms though she could also have a more distal stricture. For further evaluation patient will be scheduled for EGD to be done at the hospital.  The risks and benefits of EGD were discussed with patient's daughter who agrees to proceed    2. Recurrent PNA. She could have a component of oropharyngeal dysphagia leading to aspiration.   - A modified barium swallow study may be helpful if EGD is unrevealing.  GI ATTENDING  Evaluation with esophagram incomplete. No interval change since recent office visit. Endoscopy to further assess swallowing difficulties.The nature of the procedure, as well as the risks, benefits, and alternatives were carefully and thoroughly reviewed with the patient. Ample time for discussion and  questions allowed. The patient understood, was satisfied, and agreed to proceed.  Docia Chuck. Geri [place]in[place]., M.D. [facility] Division of Gastroenterology

## 2017-02-16 NOTE — Anesthesia Postprocedure Evaluation (Signed)
Anesthesia Post Note  Patient: AMINTA SAKURAI  Procedure(s) Performed: Procedure(s) (LRB): ESOPHAGOGASTRODUODENOSCOPY (EGD) WITH PROPOFOL (N/A)     Patient location during evaluation: Endoscopy Anesthesia Type: MAC Level of consciousness: awake and alert Pain management: pain level controlled Vital Signs Assessment: post-procedure vital signs reviewed and stable Respiratory status: spontaneous breathing, nonlabored ventilation, respiratory function stable and patient connected to nasal cannula oxygen Cardiovascular status: stable and blood pressure returned to baseline Anesthetic complications: no    Last Vitals:  Vitals:   02/16/17 1144 02/16/17 1200  BP: (!) 121/53 130/81  Pulse:  (!) 51  Resp: (!) 25 18  Temp: 36.5 C     Last Pain:  Vitals:   02/16/17 1144  TempSrc: Oral                 Latron Ribas,JAMES TERRILL

## 2017-02-16 NOTE — Anesthesia Preprocedure Evaluation (Signed)
Anesthesia Evaluation  Patient identified by MRN, date of birth, ID band Patient awake    Reviewed: Allergy & Precautions, NPO status , Patient's Chart, lab work & pertinent test results  Airway Mallampati: II       Dental   Pulmonary shortness of breath,    breath sounds clear to auscultation       Cardiovascular hypertension, +CHF  + dysrhythmias  Rhythm:Irregular     Neuro/Psych  Neuromuscular disease    GI/Hepatic GERD  ,  Endo/Other  diabetesHyperthyroidism   Renal/GU CRFRenal disease     Musculoskeletal   Abdominal   Peds  Hematology   Anesthesia Other Findings   Reproductive/Obstetrics                             Anesthesia Physical Anesthesia Plan  ASA: IV  Anesthesia Plan: MAC   Post-op Pain Management:    Induction: Intravenous  PONV Risk Score and Plan: 2 and Ondansetron and Dexamethasone  Airway Management Planned: Natural Airway and Nasal Cannula  Additional Equipment:   Intra-op Plan:   Post-operative Plan:   Informed Consent: I have reviewed the patients History and Physical, chart, labs and discussed the procedure including the risks, benefits and alternatives for the proposed anesthesia with the patient or authorized representative who has indicated his/her understanding and acceptance.     Plan Discussed with: CRNA  Anesthesia Plan Comments:         Anesthesia Quick Evaluation

## 2017-02-16 NOTE — Discharge Instructions (Signed)
YOU HAD AN ENDOSCOPIC PROCEDURE TODAY: Refer to the procedure report and other information in the discharge instructions given to you for any specific questions about what was found during the examination. If this information does not answer your questions, please call [place] office at [phone] to clarify.  ° °YOU SHOULD EXPECT: Some feelings of bloating in the abdomen. Passage of more gas than usual. Walking can help get rid of the air that was put into your GI tract during the procedure and reduce the bloating. If you had a lower endoscopy (such as a colonoscopy or flexible sigmoidoscopy) you may notice spotting of blood in your stool or on the toilet paper. Some abdominal soreness may be present for a day or two, also. ° °DIET: Your first meal following the procedure should be a light meal and then it is ok to progress to your normal diet. A half-sandwich or bowl of soup is an example of a good first meal. Heavy or fried foods are harder to digest and may make you feel nauseous or bloated. Drink plenty of fluids but you should avoid alcoholic beverages for 24 hours. If you had a esophageal dilation, please see attached instructions for diet.   ° °ACTIVITY: Your care partner should take you home directly after the procedure. You should plan to take it easy, moving slowly for the rest of the day. You can resume normal activity the day after the procedure however YOU SHOULD NOT DRIVE, use power tools, machinery or perform tasks that involve climbing or major physical exertion for 24 hours (because of the sedation medicines used during the test).  ° °SYMPTOMS TO REPORT IMMEDIATELY: °A gastroenterologist can be reached at any hour. Please call [phone]  for any of the following symptoms:  °Following lower endoscopy (colonoscopy, flexible sigmoidoscopy) °Excessive amounts of blood in the stool  °Significant tenderness, worsening of abdominal pains  °Swelling of the abdomen that is new, acute  °Fever of 100° or  higher  °Following upper endoscopy (EGD, EUS, ERCP, esophageal dilation) °Vomiting of blood or coffee ground material  °New, significant abdominal pain  °New, significant chest pain or pain under the shoulder blades  °Painful or persistently difficult swallowing  °New shortness of breath  °Black, tarry-looking or red, bloody stools ° °FOLLOW UP:  °If any biopsies were taken you will be contacted by phone or by letter within the next 1-3 weeks. Call [phone]  if you have not heard about the biopsies in 3 weeks.  °Please also call with any specific questions about appointments or follow up tests. ° °

## 2017-02-16 NOTE — Transfer of Care (Signed)
Immediate Anesthesia Transfer of Care Note  Patient: Anna [name]  Procedure(s) Performed: Procedure(s): ESOPHAGOGASTRODUODENOSCOPY (EGD) WITH PROPOFOL (N/A)  Patient Location: PACU and Endoscopy Unit  Anesthesia Type:MAC  Level of Consciousness: awake and alert   Airway & Oxygen Therapy: Patient Spontanous Breathing and Patient connected to nasal cannula oxygen  Post-op Assessment: Report given to RN and Post -op Vital signs reviewed and stable  Post vital signs: Reviewed and stable  Last Vitals:  Vitals:   02/16/17 1144 02/16/17 1200  BP: (!) 121/53 130/81  Pulse:  (!) 51  Resp: (!) 25 18  Temp: 36.5 C     Last Pain:  Vitals:   02/16/17 1144  TempSrc: Oral         Complications: No apparent anesthesia complications

## 2017-02-16 NOTE — Op Note (Signed)
[facility] Patient Name: Anna [name] Procedure Date : 02/16/2017 MRN: [id number] Attending MD: Docia Chuck. Henrene Pastor , MD Date of Birth: 04-Feb-1929 CSN: [id number] Age: 83 Admit Type: Outpatient Procedure:                Upper GI endoscopy Indications:              Dysphagia.Aspiration. Incomplete swallowing study.                            No thyroid mass. Endoscopy to clarify esophageal                            status Providers:                Docia Chuck. Henrene Pastor, MD, Cleda Daub, RN, Corliss Parish, Technician Referring MD:              Medicines:                Monitored Anesthesia Care Complications:            No immediate complications. Estimated Blood Loss:     Estimated blood loss: none. Procedure:                Pre-Anesthesia Assessment:                           - Prior to the procedure, a History and Physical                            was performed, and patient medications and                            allergies were reviewed. The patient's tolerance of                            previous anesthesia was also reviewed. The risks                            and benefits of the procedure and the sedation                            options and risks were discussed with the patient.                            All questions were answered, and informed consent                            was obtained. Prior Anticoagulants: The patient has                            taken no previous anticoagulant or antiplatelet                            agents. ASA Grade Assessment:  III - A patient with                            severe systemic disease. After reviewing the risks                            and benefits, the patient was deemed in                            satisfactory condition to undergo the procedure.                           After obtaining informed consent, the endoscope was                            passed under direct vision.  Throughout the                            procedure, the patient's blood pressure, pulse, and                            oxygen saturations were monitored continuously. The                            Endosonoscope was introduced through the mouth, and                            advanced to the second part of duodenum. The upper                            GI endoscopy was accomplished without difficulty.                            The patient tolerated the procedure well. Scope In: Scope Out: Findings:      The esophagus was normal.      The stomach was normal.      The examined duodenum was normal.      The cardia and gastric fundus were normal on retroflexion. Impression:               - Normal esophagus. No obvious significant                            extrinsic compression.                           - Normal stomach.                           - Normal examined duodenum.                           - No specimens collected. Moderate Sedation:      none Recommendation:           - Speech pathology evaluation "oral pharyngeal  dysphagia". My office will help to arrange this.                           - Resume previous diet.                           - Continue present medications.                           - Return to the care of your primary providers                           DISCUSSED WITH FAMILY. PROCEDURE REPORT PROVIDED Procedure Code(s):        --- Professional ---                           [phone], Esophagogastroduodenoscopy, flexible,                            transoral; diagnostic, including collection of                            specimen(s) by brushing or washing, when performed                            (separate procedure) Diagnosis Code(s):        --- Professional ---                           R13.10, Dysphagia, unspecified CPT copyright 2016 American Medical Association. All rights reserved. The codes documented in this report are  preliminary and upon coder review may  be revised to meet current compliance requirements. Docia Chuck. Henrene Pastor, MD 02/16/2017 11:38:49 AM This report has been signed electronically. Number of Addenda: 0

## 2017-02-19 ENCOUNTER — Encounter (HOSPITAL_COMMUNITY): Payer: Self-pay | Admitting: Internal Medicine

## 2017-02-21 ENCOUNTER — Other Ambulatory Visit: Payer: Self-pay | Admitting: Surgery

## 2017-02-21 DIAGNOSIS — E041 Nontoxic single thyroid nodule: Secondary | ICD-10-CM | POA: Diagnosis not present

## 2017-02-24 ENCOUNTER — Other Ambulatory Visit: Payer: Self-pay

## 2017-02-24 ENCOUNTER — Telehealth: Payer: Self-pay

## 2017-02-24 DIAGNOSIS — R1312 Dysphagia, oropharyngeal phase: Secondary | ICD-10-CM

## 2017-02-24 NOTE — Telephone Encounter (Signed)
-----   Message from Irene Shipper, MD sent at 02/24/2017  8:52 AM EDT ----- Regarding: RE: Question Modified barium swallow with speech pathology. The speech pathologist may be able to help with swallowing difficulties. Thanks ----- Message ----- From: Algernon Huxley, RN Sent: 02/23/2017   4:20 PM To: Irene Shipper, MD Subject: Question                                       Dr. Henrene Pastor on the procedure report for this pt you state "speech pathology evaluation "oral pharyngeal dysphagia."  Do you just want a modified barium swallow?

## 2017-02-24 NOTE — Telephone Encounter (Signed)
Ordered entered in epic.

## 2017-03-01 ENCOUNTER — Telehealth: Payer: Self-pay | Admitting: Internal Medicine

## 2017-03-01 ENCOUNTER — Telehealth: Payer: Self-pay | Admitting: Adult Health

## 2017-03-01 ENCOUNTER — Other Ambulatory Visit (HOSPITAL_COMMUNITY): Payer: Self-pay | Admitting: Internal Medicine

## 2017-03-01 DIAGNOSIS — R1310 Dysphagia, unspecified: Secondary | ICD-10-CM

## 2017-03-01 NOTE — Telephone Encounter (Signed)
pts daughter state that she would like to see if she can bring in the pts urine in due to her still not feeling better and has some settlement in the urine.

## 2017-03-01 NOTE — Telephone Encounter (Signed)
Unable to leave a message due to the voicemail being full.   

## 2017-03-01 NOTE — Telephone Encounter (Signed)
She just needs to be seen. They can bring a clean catch urine in with them

## 2017-03-01 NOTE — Telephone Encounter (Signed)
Order was placed for this 02/24/17. Call placed to rehab, they are going to contact daughter today about scheduling modified barium swallow with speech pathology.

## 2017-03-02 ENCOUNTER — Other Ambulatory Visit: Payer: Self-pay | Admitting: Adult Health

## 2017-03-02 DIAGNOSIS — N3020 Other chronic cystitis without hematuria: Secondary | ICD-10-CM | POA: Diagnosis not present

## 2017-03-02 DIAGNOSIS — N390 Urinary tract infection, site not specified: Secondary | ICD-10-CM

## 2017-03-02 NOTE — Telephone Encounter (Signed)
  I called the pts daughter and informed her of the message below and she stated the pt has an appt with [facility] today at 9:30am.  Message sent to [facility] as [place].

## 2017-03-09 ENCOUNTER — Telehealth: Payer: Self-pay | Admitting: Cardiovascular Disease

## 2017-03-09 ENCOUNTER — Encounter: Payer: Self-pay | Admitting: Family Medicine

## 2017-03-09 ENCOUNTER — Inpatient Hospital Stay (HOSPITAL_COMMUNITY): Admission: RE | Admit: 2017-03-09 | Payer: Medicare HMO | Source: Ambulatory Visit

## 2017-03-09 ENCOUNTER — Ambulatory Visit (INDEPENDENT_AMBULATORY_CARE_PROVIDER_SITE_OTHER): Payer: Medicare HMO | Admitting: Family Medicine

## 2017-03-09 ENCOUNTER — Ambulatory Visit (INDEPENDENT_AMBULATORY_CARE_PROVIDER_SITE_OTHER)
Admission: RE | Admit: 2017-03-09 | Discharge: 2017-03-09 | Disposition: A | Discharge status: Home / Self Care | Payer: Medicare HMO | Source: Ambulatory Visit | Attending: Family Medicine | Admitting: Family Medicine

## 2017-03-09 ENCOUNTER — Ambulatory Visit (HOSPITAL_COMMUNITY): Payer: Medicare HMO

## 2017-03-09 VITALS — BP 140/80 | HR 82 | Resp 14 | Ht 65.0 in | Wt 180.0 lb

## 2017-03-09 DIAGNOSIS — R0602 Shortness of breath: Secondary | ICD-10-CM | POA: Diagnosis not present

## 2017-03-09 DIAGNOSIS — R0600 Dyspnea, unspecified: Secondary | ICD-10-CM | POA: Diagnosis not present

## 2017-03-09 DIAGNOSIS — I509 Heart failure, unspecified: Secondary | ICD-10-CM

## 2017-03-09 DIAGNOSIS — R05 Cough: Secondary | ICD-10-CM | POA: Diagnosis not present

## 2017-03-09 NOTE — Telephone Encounter (Signed)
Returned call to daughter, will increase lasix, call PCP to request urgent visit and/or CXR to r/o PNA.  Also advised we are trying to get patient in to see Dr. Oval Linsey tomorrow, will call back with time.   Daughter aware and verbalized understanding,

## 2017-03-09 NOTE — Telephone Encounter (Signed)
Spoke with daughter and patient scheduled to see Dr Oval Linsey tomorrow

## 2017-03-09 NOTE — Patient Instructions (Signed)
Please take 30 mg daily of Lasix and obtain a chest X-ray at [facility].  Follow up tomorrow with cardiology.

## 2017-03-09 NOTE — Telephone Encounter (Signed)
New Message   Pt c/o Shortness Of Breath: STAT if SOB developed within the last 24 hours or pt is noticeably SOB on the phone  1. Are you currently SOB (can you hear that pt is SOB on the phone)? Yes  2. How long have you been experiencing SOB? 4-5 days  3. Are you SOB when sitting or when up moving around? Both  4. Are you currently experiencing any other symptoms? Wheezing, fatigue

## 2017-03-09 NOTE — Telephone Encounter (Signed)
Received call from daughter (ok per DPR)-reports patient has been experiencing SOB, cough, wheezing x 4-5 days.  Reports this is at rest and exertion.  Denies swelling BL LE, unable to weigh.  Reports her O2 sats were dropping into the 80s the last few days as well.  Had increased her lasix to 30mg  (as directed on rx) for 3 days and today she has improved but needs further direction.  Today her  O2 sats are in the 90s, still breathing heavy and wheezing, but states "not as bad".   Reports concern for PNA or HF.  Reports she had xray approximately 1 month ago to r/o PNA and it showed pulm edema.  Denies productive cough, fever, chills.   Reports she is having to give her beta blocker a little earlier as well as her HR seems to be increased at 90-100s but resolves with medication.   Patient denies CP, but daughter reports she doesn't speak much.   Requesting recommendations on lasix as well as request to be seen tomorrow.  Reports patient is resting now and O2 sats are stable in the 90s.    No available openings tomorrow, will seek recommendations from Dr. Oval Linsey.

## 2017-03-09 NOTE — Telephone Encounter (Signed)
Keep giving lasix 30 until wheezing resolves.  If she can't be seen here, recommend going to PCP for an urgent visit.  Can't distinguish heart failure from pneumonia without seeing her and likely a CXR.

## 2017-03-09 NOTE — Progress Notes (Signed)
Subjective:    Patient ID: Anna [name], female    DOB: 10-15-1928, 83 y.o.   MRN: [id number]  HPI  Anna [name] is an 83 year old female who presents today with daughter who reports SOB that has been present for 4 days. Associated cough only once today, no sputum, and wheezing per patient's daughter which has improved. She denies fever, chills, sweats, N/V/D, bilateral LE edema. Daughter reports that she is unable to weigh patient.   Daughter is providing history and reports increasing lasix to 30 mg per day for 3 days and since she had improved she gave 20 mg today. Symptoms have improved overall. Daughter states that she contacted cardiology office regarding symptoms and she was advised to continue 30 mg/day and follow up tomorrow for evaluation however consider seeking evaluation or contacting PCP to obtain a chest X-ray to determine if pneumonia or heart failure is the cause of symptoms.  She has a PMH of Alzheimer's disease, Atrial fibrillation, Bronchitis, CHP, chronic renal insufficiency stage III, DM, GERD, GI bleeding, Hyperlipidemia, HTN, Hyperthroidism, left bundle branch block, overactive bladder, and vitamin B12 and D deficiency.   She was evaluated for a cough 02/08/17 and chest X-ray did not indicate pneumonia. Mild cardiomegaly and pulmonary edema noted. Lab work was stable with no evidence of sepsis. Daughter states that patient improved until this episode and increase in Lasix has improved her symptoms.  Review of Systems  Constitutional: Negative for chills and fever.  Respiratory: Positive for cough and shortness of breath.   Cardiovascular: Negative for chest pain, palpitations and leg swelling.  Gastrointestinal: Negative for abdominal pain, constipation, diarrhea, nausea and vomiting.  Skin: Negative for rash.  Psychiatric/Behavioral:       Cognitive decline; daughter provided history   Past Medical History:  Diagnosis Date  . Alzheimer's disease   . Atrial  fibrillation ([place])   . Bronchitis   . CHF (congestive heart failure) ([place])   . Chronic renal insufficiency, stage III (moderate)   . Diabetes mellitus without complication ([place])   . Dyspnea    with exertion  . GERD (gastroesophageal reflux disease)   . GI bleeding 11/2013   due to supratherapeutic INR  . Hyperlipidemia   . Hypertension   . Hyperthyroidism    on amiodarone  . LBBB (left bundle branch block)   . LBBB (left bundle branch block)   . Overactive bladder   . Overweight   . Persistent atrial fibrillation ([place])   . Pneumonia   . Thyroid mass    LLL  . UTI (urinary tract infection)   . Vitamin B12 deficiency   . Vitamin D deficiency      Social History   Social History  . Marital status: Divorced    Spouse name: N/A  . Number of children: N/A  . Years of education: N/A   Occupational History  . Not on file.   Social History Main Topics  . Smoking status: Never Smoker  . Smokeless tobacco: Never Used  . Alcohol use No  . Drug use: No  . Sexual activity: Not on file   Other Topics Concern  . Not on file   Social History Narrative   Pt recently moved to [place] with daughter from [place]       Past Surgical History:  Procedure Laterality Date  . ABDOMINAL HYSTERECTOMY    . ESOPHAGOGASTRODUODENOSCOPY (EGD) WITH PROPOFOL N/A 02/16/2017   Procedure: ESOPHAGOGASTRODUODENOSCOPY (EGD) WITH PROPOFOL;  Surgeon: Irene Shipper, MD;  Location: [facility] ENDOSCOPY;  Service: Endoscopy;  Laterality: N/A;    Family History  Problem Relation Age of Onset  . Heart disease Father   . Diabetes Mother   . Thyroid disease Neg Hx     Allergies  Allergen Reactions  . Pollen Extract     Sneezing, watery eyes    Current Outpatient Prescriptions on File Prior to Visit  Medication Sig Dispense Refill  . ACCU-CHEK AVIVA PLUS test strip PATIENT TO CHECK SUGARS ONCE DAILY.  3  . acetaminophen (TYLENOL) 500 MG tablet Take 500 mg by mouth every 6 (six) hours as needed for  fever.     Marland Kitchen aspirin 325 MG tablet Take 325 mg by mouth daily.    . calcitRIOL (ROCALTROL) 0.5 MCG capsule Take 1 capsule (0.5 mcg total) by mouth 3 (three) times a week. Monday, Wednesday, and Friday only 36 capsule 3  . carbamide peroxide (DEBROX) 6.5 % otic solution Place 5 drops into both ears 2 (two) times daily. 15 mL 0  . ciprofloxacin (CIPRO) 500 MG tablet Take 1 tablet (500 mg total) by mouth 2 (two) times daily. 6 tablet 0  . docusate sodium (COLACE) 100 MG capsule Take 100 mg by mouth daily as needed for mild constipation.    Marland Kitchen donepezil (ARICEPT) 10 MG tablet TAKE 1 TABLET ONCE DAILY. 90 tablet 3  . fluticasone (FLONASE) 50 MCG/ACT nasal spray Place 2 sprays into both nostrils daily.    . furosemide (LASIX) 20 MG tablet Take 20 mg by mouth daily. Take an extra 0.5 tablet daily as needed.    Marland Kitchen glimepiride (AMARYL) 2 MG tablet Take 2 mg by mouth daily with breakfast.    . guaiFENesin (MUCINEX) 600 MG 12 hr tablet Take 600 mg by mouth 2 (two) times daily as needed for cough. Reported on 12/22/2015    . Iron-FA-B Cmp-C-Biot-Probiotic (FUSION PLUS) CAPS Take 1 tablet by mouth daily. 90 capsule 3  . loratadine (ALAVERT) 10 MG tablet Take 5 mg by mouth daily. Every 2 days     . metoprolol tartrate (LOPRESSOR) 25 MG tablet Take 1 tablet (25 mg total) by mouth 2 (two) times daily. 60 tablet 2  . nitrofurantoin, macrocrystal-monohydrate, (MACROBID) 100 MG capsule Take 1 capsule (100 mg total) by mouth 2 (two) times daily. 10 capsule 0  . omeprazole (PRILOSEC) 40 MG capsule Take 30- 60 min before your first and last meals of the day 60 capsule 11  . Polyethyl Glycol-Propyl Glycol (SYSTANE) 0.4-0.3 % SOLN Apply 1 drop to eye 2 (two) times daily as needed (dry eyes).    . pravastatin (PRAVACHOL) 40 MG tablet Take 40 mg by mouth daily.    . vitamin B-12 (CYANOCOBALAMIN) 500 MCG tablet Take 500 mcg by mouth daily.    . Vitamin D, Ergocalciferol, (DRISDOL) 50000 units CAPS capsule Take 1 capsule (50,000  Units total) by mouth once a week. Mondays 4 capsule 1   No current facility-administered medications on file prior to visit.     BP 140/80 (BP Location: Left Arm, Patient Position: Sitting, Cuff Size: Normal)   Pulse 82   Resp 14   Ht 5\' 5"  (1.651 m)   Wt 180 lb (81.6 kg)   SpO2 97%   BMI 29.95 kg/m       Objective:   Physical Exam  Constitutional:  Optimally nourished female presenting in wheelchair with daughter. Clearly confused and trying to lean forward in wheelchair during exam  Neck: Neck supple.  Cardiovascular: Normal  rate and intact distal pulses.   Persistent a-fib that is rate controlled  Pulmonary/Chest: Effort normal and breath sounds normal. She has no wheezes. She has no rales.  Patient does not verbally communicate and due to cognitive decline it is challenging to auscultate lungs as she is not taking deep breaths. No wheeze, rhonchi, or rales noted. Possible decreased breath sounds at base.   Musculoskeletal: She exhibits no edema.  Lymphadenopathy:    She has no cervical adenopathy.  Skin: Skin is warm and dry. No rash noted.      Assessment & Plan:  1. Dyspnea, unspecified type Improving per patient's daughter; Pulse oximeter 97% during entire exam; RR 15; Advised continued 30 mg daily of Lasix until cardiology follow up tomorrow; will obtain chest X-ray to determine if there is underlying pneumonia or heart failure. No fever present, improvement with Lasix, and report of coughing once without sputum; low suspicion for pneumonia. With findings above opted for chest X-ray and no lab work will be ordered at this visit as daughter states it is extremely challenging to obtain lab work and she has improved and daughter does not prefer lab work at this time. She has contacted cardiology and will be evaluated tomorrow; she is waiting on appointment time. - DG Chest 2 View; Future  2. Congestive heart failure, unspecified HF chronicity, unspecified heart failure type  ([place])   Follow up as recommended with cardiology and PCP as recommended.  Delano Metz, FNP-C

## 2017-03-10 ENCOUNTER — Encounter: Payer: Self-pay | Admitting: Cardiovascular Disease

## 2017-03-10 ENCOUNTER — Ambulatory Visit (INDEPENDENT_AMBULATORY_CARE_PROVIDER_SITE_OTHER): Payer: Medicare HMO | Admitting: Cardiovascular Disease

## 2017-03-10 VITALS — BP 143/80 | HR 80 | Ht 65.0 in | Wt 178.0 lb

## 2017-03-10 DIAGNOSIS — I481 Persistent atrial fibrillation: Secondary | ICD-10-CM

## 2017-03-10 DIAGNOSIS — I5042 Chronic combined systolic (congestive) and diastolic (congestive) heart failure: Secondary | ICD-10-CM | POA: Diagnosis not present

## 2017-03-10 DIAGNOSIS — I4819 Other persistent atrial fibrillation: Secondary | ICD-10-CM

## 2017-03-10 DIAGNOSIS — E7800 Pure hypercholesterolemia, unspecified: Secondary | ICD-10-CM | POA: Diagnosis not present

## 2017-03-10 MED ORDER — FUROSEMIDE 20 MG PO TABS: 30.0000 mg | ORAL_TABLET | Freq: Every day | ORAL | 5 refills | Status: DC

## 2017-03-10 NOTE — Patient Instructions (Addendum)
Medication Instructions:  INCREASE FUROSEMIDE TO 30 MG DAILY AND 1/2 TABLET AS NEEDED FOR SWELLING   Labwork: NONE  Testing/Procedures: NONE  Follow-Up: Your physician recommends that you schedule a follow-up appointment in: 3 MONTH OV  If you need a refill on your cardiac medications before your next appointment, please call your pharmacy.

## 2017-03-10 NOTE — Progress Notes (Signed)
Cardiology Office Note   Date:  03/12/2017   ID:  Anna [name], DOB 13-Jul-1929, MRN [id number]  PCP:  Anna Peng, NP  Cardiologist:   Anna Latch, MD   No chief complaint on file.     History of Present Illness: Anna [name] is a 83 y.o. female with chronic systolic and diastolic heart failure, persistent atrial fibrillation, prior GI bleed, hypertensive heart disease, diabetes, and CKD III who presents for follow up.  Anna [name] was hospitalized 08/2016 with atrial fibrillation with RVR and volume overloaded.  During that hospitalization she had an echocardiogram that revealed LVEF 35-40% with diffuse hypokinesis.  This was reduced from her echo 08/2015 that revealed LVEF 45-50%.  Prior to admission she was treated as an outpatient for pneumonia.  Her metoprolol was increased to 12.5mg  bid with improved rate control.  She was mildly volume overloaded and diuresed with IV lasix.  Prior to her hospitalization Anna [name] had been taken off metoprolol due to hypotension and weakness.    Anna [name] presents today with her daughter who provides the interview, as Anna [name] does not speak.  She started noticing heavy breathing 6 days ago.  Her daughter also noted that she was wheezing.  There was no fever or chills.  She had a similar episode last month.  She had a chest xray at that time which showed mild pulmonary edema.  Her heart rate was elevated to the 110-120s.  She gave her metoprolol early, which brought it under 100 bpm.  She didn't notice any edema but did observe heavy breathing when laying down.  She increased her lasix to 30 mg instead of 20 mg.  In the last two days she seems to have improved.  Her breathing is no longer labored.     Past Medical History:  Diagnosis Date  . Alzheimer's disease   . Atrial fibrillation ([place])   . Bronchitis   . CHF (congestive heart failure) ([place])   . Chronic renal insufficiency, stage III (moderate)   . Diabetes mellitus without  complication ([place])   . Dyspnea    with exertion  . GERD (gastroesophageal reflux disease)   . GI bleeding 11/2013   due to supratherapeutic INR  . Hyperlipidemia   . Hypertension   . Hyperthyroidism    on amiodarone  . LBBB (left bundle branch block)   . LBBB (left bundle branch block)   . Overactive bladder   . Overweight   . Persistent atrial fibrillation ([place])   . Pneumonia   . Thyroid mass    LLL  . UTI (urinary tract infection)   . Vitamin B12 deficiency   . Vitamin D deficiency     Past Surgical History:  Procedure Laterality Date  . ABDOMINAL HYSTERECTOMY    . ESOPHAGOGASTRODUODENOSCOPY (EGD) WITH PROPOFOL N/A 02/16/2017   Procedure: ESOPHAGOGASTRODUODENOSCOPY (EGD) WITH PROPOFOL;  Surgeon: Irene Shipper, MD;  Location: [facility] ENDOSCOPY;  Service: Endoscopy;  Laterality: N/A;     Current Outpatient Prescriptions  Medication Sig Dispense Refill  . ACCU-CHEK AVIVA PLUS test strip PATIENT TO CHECK SUGARS ONCE DAILY.  3  . acetaminophen (TYLENOL) 500 MG tablet Take 500 mg by mouth every 6 (six) hours as needed for fever.     Marland Kitchen aspirin 325 MG tablet Take 325 mg by mouth daily.    . calcitRIOL (ROCALTROL) 0.5 MCG capsule Take 1 capsule (0.5 mcg total) by mouth 3 (three) times a week. Monday, Wednesday, and Friday only 36  capsule 3  . carbamide peroxide (DEBROX) 6.5 % otic solution Place 5 drops into both ears 2 (two) times daily. 15 mL 0  . ciprofloxacin (CIPRO) 500 MG tablet Take 1 tablet (500 mg total) by mouth 2 (two) times daily. 6 tablet 0  . docusate sodium (COLACE) 100 MG capsule Take 100 mg by mouth daily as needed for mild constipation.    Marland Kitchen donepezil (ARICEPT) 10 MG tablet TAKE 1 TABLET ONCE DAILY. 90 tablet 3  . fluticasone (FLONASE) 50 MCG/ACT nasal spray Place 2 sprays into both nostrils daily.    . furosemide (LASIX) 20 MG tablet Take 1.5 tablets (30 mg total) by mouth daily. Take an extra 0.5 tablet daily as needed. 60 tablet 5  . glimepiride (AMARYL) 2 MG tablet  Take 2 mg by mouth daily with breakfast.    . guaiFENesin (MUCINEX) 600 MG 12 hr tablet Take 600 mg by mouth 2 (two) times daily as needed for cough. Reported on 12/22/2015    . Iron-FA-B Cmp-C-Biot-Probiotic (FUSION PLUS) CAPS Take 1 tablet by mouth daily. 90 capsule 3  . loratadine (ALAVERT) 10 MG tablet Take 5 mg by mouth daily. Every 2 days     . metoprolol tartrate (LOPRESSOR) 25 MG tablet Take 1 tablet (25 mg total) by mouth 2 (two) times daily. 60 tablet 2  . nitrofurantoin, macrocrystal-monohydrate, (MACROBID) 100 MG capsule Take 1 capsule (100 mg total) by mouth 2 (two) times daily. 10 capsule 0  . omeprazole (PRILOSEC) 40 MG capsule Take 30- 60 min before your first and last meals of the day 60 capsule 11  . Polyethyl Glycol-Propyl Glycol (SYSTANE) 0.4-0.3 % SOLN Apply 1 drop to eye 2 (two) times daily as needed (dry eyes).    . pravastatin (PRAVACHOL) 40 MG tablet Take 40 mg by mouth daily.    . vitamin B-12 (CYANOCOBALAMIN) 500 MCG tablet Take 500 mcg by mouth daily.    . Vitamin D, Ergocalciferol, (DRISDOL) 50000 units CAPS capsule Take 1 capsule (50,000 Units total) by mouth once a week. Mondays 4 capsule 1   No current facility-administered medications for this visit.     Allergies:   Pollen extract    Social History:  The patient  reports that she has never smoked. She has never used smokeless tobacco. She reports that she does not drink alcohol or use drugs.   Family History:  The patient's family history includes Diabetes in her mother; Heart disease in her father.    ROS:  Unable to obtain ROS due to dementia.    PHYSICAL EXAM: VS:  BP (!) 143/80   Pulse 80   Ht 5\' 5"  (1.651 m)   Wt 80.7 kg (178 lb)   BMI 29.62 kg/m  , BMI Body mass index is 29.62 kg/m.  Patient too weak to get up on scale. GENERAL:  Well-appearing.  No acute distress.  Not interactive.  Repeatedly leaning forward out of her wheelchair HEENT:  Pupils equal round and reactive, fundi not visualized,  oral mucosa unremarkable NECK:  No jugular venous distention, waveform within normal limits, carotid upstroke brisk and symmetric, no bruits LUNGS:  Clear to auscultation bilaterally.  No crackles, wheezes or rhonchi. HEART:  Irregularly irregular.  PMI not displaced or sustained,S1 and S2 within normal limits, no S3, no S4, no clicks, no rubs, no murmurs ABD:  Flat, positive bowel sounds normal in frequency in pitch, no bruits, no rebound, no guarding, no midline pulsatile mass, no hepatomegaly, no splenomegaly EXT:  2 plus pulses throughout, no edema, no cyanosis no clubbing SKIN:  No rashes no nodules NEURO:  Dementia.  Unable to cooperate with full neuro exam. PSYCH:  Oriented to self only.   EKG:  EKG is not ordered today.   Echo 09/16/16: Study Conclusions  - Left ventricle: The cavity size was normal. Wall thickness was   normal. Systolic function was moderately reduced. The estimated   ejection fraction was in the range of 35% to 40%. Moderate   diffuse hypokinesis with no identifiable regional variations.   Acoustic contrast opacification revealed no evidence ofthrombus. - Ventricular septum: Septal motion showed abnormal function,   dyssynergy, and paradox. These changes are consistent with a left   bundle branch block. - Left atrium: The atrium was mildly dilated.  Recent Labs: 09/15/2016: Magnesium 2.0 12/30/2016: ALT 9; B Natriuretic Peptide 108.5; TSH 0.421 02/08/2017: BUN 18; Creatinine, Ser 0.97; Hemoglobin 12.1; Platelets 189.0; Potassium 3.9; Sodium 141    Lipid Panel No results found for: CHOL, TRIG, HDL, CHOLHDL, VLDL, LDLCALC, LDLDIRECT    Wt Readings from Last 3 Encounters:  03/10/17 80.7 kg (178 lb)  03/09/17 81.6 kg (180 lb)  02/16/17 77.1 kg (170 lb)      ASSESSMENT AND PLAN:  # Persistent atrial fibrillation: Ms. Bambach remains in atrial fibrillation.  Rates are well-controlled. They were elevated last week but are now <100 bpm. She is not on  anticoagulation due to history of GI bleed and fall risk.  Continue metoprolol and aspirin.    # Chronic systolic and diastolic heart failure: Ms. Jorgenson is currently euvolemic. H However she has had two episodes of heart failure in the last month.  We will increase lasix to 30 mg daily with plans to increase to 40mg  as needed for wheezing or weight gain.  LVEF 35-40% on her most recent echocardiogram. She is not on an ACE inhibitor due to low blood pressure. She is also had renal dysfunction.   # Hyperlipidemia:  Continue pravastatin.    Current medicines are reviewed at length with the patient today.  The patient does not have concerns regarding medicines.  The following changes have been made:  Increase lasix to 30mg  daily.  Labs/ tests ordered today include:   No orders of the defined types were placed in this encounter.    Disposition:   FU with Joanny Dupree C. Oval Linsey, MD, [facility] in 3 months   This note was written with the assistance of speech recognition software.  Please excuse any transcriptional errors.  Signed, Kysha Muralles C. Oval Linsey, MD, [facility]  03/12/2017 9:25 PM    [facility]

## 2017-03-13 ENCOUNTER — Telehealth: Payer: Self-pay | Admitting: Adult Health

## 2017-03-13 DIAGNOSIS — M533 Sacrococcygeal disorders, not elsewhere classified: Secondary | ICD-10-CM

## 2017-03-13 NOTE — Telephone Encounter (Signed)
° ° ° °  Pt daughter call to say that they were supposed to be referred out to a orthapedic but has not heard anything. She said they would like to be referred to Dr Sharol Given at [facility] for her tail bone issue

## 2017-03-13 NOTE — Telephone Encounter (Signed)
Tried to call [name] (daughter) but received a message that the voicemail box was full and cannot accept messages.  Will try again at a later time.

## 2017-03-14 NOTE — Telephone Encounter (Signed)
Anna [name], spoke to [name] (daughter) and pt has an appt with Dr. Sharol Given on 03/15/17.  I do not see where you have placed a referral.  Did you agree to this?  Nothing mentioned in your note or Julie's. Please advise.

## 2017-03-15 ENCOUNTER — Ambulatory Visit (INDEPENDENT_AMBULATORY_CARE_PROVIDER_SITE_OTHER): Payer: Medicare HMO

## 2017-03-15 ENCOUNTER — Ambulatory Visit (HOSPITAL_COMMUNITY)
Admission: RE | Admit: 2017-03-15 | Discharge: 2017-03-15 | Disposition: A | Discharge status: Home / Self Care | Payer: Medicare HMO | Source: Ambulatory Visit | Attending: Internal Medicine | Admitting: Internal Medicine

## 2017-03-15 ENCOUNTER — Ambulatory Visit (INDEPENDENT_AMBULATORY_CARE_PROVIDER_SITE_OTHER): Payer: Medicare HMO | Admitting: Orthopedic Surgery

## 2017-03-15 ENCOUNTER — Encounter (INDEPENDENT_AMBULATORY_CARE_PROVIDER_SITE_OTHER): Payer: Self-pay | Admitting: Orthopedic Surgery

## 2017-03-15 VITALS — Ht 65.0 in | Wt 178.0 lb

## 2017-03-15 DIAGNOSIS — M533 Sacrococcygeal disorders, not elsewhere classified: Secondary | ICD-10-CM

## 2017-03-15 DIAGNOSIS — R1310 Dysphagia, unspecified: Secondary | ICD-10-CM | POA: Diagnosis not present

## 2017-03-15 DIAGNOSIS — R1312 Dysphagia, oropharyngeal phase: Secondary | ICD-10-CM

## 2017-03-15 DIAGNOSIS — F0390 Unspecified dementia without behavioral disturbance: Secondary | ICD-10-CM | POA: Diagnosis not present

## 2017-03-15 NOTE — Progress Notes (Signed)
Office Visit Note   Patient: Anna [name]           Date of Birth: March 07, 1929           MRN: [id number] Visit Date: 03/15/2017              Requested by: Dorothyann Peng, NP [place], [place] [address] PCP: Dorothyann Peng, NP  Chief Complaint  Patient presents with  . Spine - Pain, Tailbone Pain    Coccyx pain      HPI: Patient is an 83 year old woman who was had an 8 month history of coccyx pain. The patient leans forward she has fallen out of her chair multiple times. Patient has advanced dementia and does not respond to questions. She sits slumped over in her chair. Patient's daughter states that she can no longer take care of her due to the postural problems and with coccyx pain.  Assessment & Plan: Visit Diagnoses:  1. Pain in the coccyx     Plan: Discussed that we could have Dr. Ernestina Patches schedule her call her for evaluation for possible injection in this region.  Follow-Up Instructions: Return if symptoms worsen or fail to improve.   Ortho Exam  Patient is not alert or oriented. She does not respond to questions., no adenopathy, normal respiratory effort. Patient sits slumped overhand in her chair. Examination of the lumbar and thoracic spine there is no tenderness to palpation. Patient does not respond to palpation along the coccyx but patient's daughter states that this has been painful over the past 8 months.  Imaging: Dg Op Swallowing Func-medicare/speech Path  Result Date: 03/15/2017 Objective Swallowing Evaluation: Type of Study: MBS-Modified Barium Swallow Study Patient Details Name: Anna [name] MRN: [id number] Date of Birth: 16-Apr-1929 Today's Date: 03/15/2017 Time: SLP Start Time (ACUTE ONLY): 1100-SLP Stop Time (ACUTE ONLY): 1125 SLP Time Calculation (min) (ACUTE ONLY): 25 min Past Medical History: Past Medical History: Diagnosis Date . Alzheimer's disease  . Atrial fibrillation ([place])  . Bronchitis  . CHF (congestive heart failure) ([place])  .  Chronic renal insufficiency, stage III (moderate)  . Diabetes mellitus without complication ([place])  . Dyspnea   with exertion . GERD (gastroesophageal reflux disease)  . GI bleeding 11/2013  due to supratherapeutic INR . Hyperlipidemia  . Hypertension  . Hyperthyroidism   on amiodarone . LBBB (left bundle branch block)  . LBBB (left bundle branch block)  . Overactive bladder  . Overweight  . Persistent atrial fibrillation ([place])  . Pneumonia  . Thyroid mass   LLL . UTI (urinary tract infection)  . Vitamin B12 deficiency  . Vitamin D deficiency  Past Surgical History: Past Surgical History: Procedure Laterality Date . ABDOMINAL HYSTERECTOMY   . ESOPHAGOGASTRODUODENOSCOPY (EGD) WITH PROPOFOL N/A 02/16/2017  Procedure: ESOPHAGOGASTRODUODENOSCOPY (EGD) WITH PROPOFOL;  Surgeon: Irene Shipper, MD;  Location: [facility] ENDOSCOPY;  Service: Endoscopy;  Laterality: N/A; HPI: Pt is an 83 year old female arriving for an outpatient MBS. Pt has new finding of thyroid mass with resulting dysphonia and during esophagram was observed to have a silent aspiration event. Pt has dementia, has been seen by SLP at bedside during prior admission for CHF related pulmonary edema. Daughter given close supervision for meals careful feeding.   No Data Recorded Assessment / Plan / Recommendation CHL IP CLINICAL IMPRESSIONS 03/15/2017 Clinical Impression Pt demonstrates trace flash and frank penetration intermittently before/during the swallow due to slight delay of airway closure during the swallow. Penetrates are mostly expelled  in further swallow attempts. Function is not outside of normal limits for age or with dementia diagnosis and does not pose a significant threat of aspiration or pneumonia. Encourage pts daughter to focus on careful feeding, small sips. No diet modification or SLP f/u necessary.  SLP Visit Diagnosis Dysphagia, oropharyngeal phase (R13.12) Attention and concentration deficit following -- Frontal lobe and executive function deficit  following -- Impact on safety and function Mild aspiration risk   CHL IP TREATMENT RECOMMENDATION 03/15/2017 Treatment Recommendations No treatment recommended at this time   No flowsheet data found. CHL IP DIET RECOMMENDATION 03/15/2017 SLP Diet Recommendations Regular solids;Thin liquid Liquid Administration via Cup;Straw Medication Administration Whole meds with liquid Compensations Slow rate;Small sips/bites;Minimize environmental distractions Postural Changes Remain semi-upright after after feeds/meals (Comment);Seated upright at 90 degrees   CHL IP OTHER RECOMMENDATIONS 03/15/2017 Recommended Consults -- Oral Care Recommendations Oral care BID Other Recommendations --   CHL IP FOLLOW UP RECOMMENDATIONS 03/15/2017 Follow up Recommendations None   CHL IP FREQUENCY AND DURATION 09/15/2016 Speech Therapy Frequency (ACUTE ONLY) min 2x/week Treatment Duration --      CHL IP ORAL PHASE 03/15/2017 Oral Phase WFL Oral - Pudding Teaspoon -- Oral - Pudding Cup -- Oral - Honey Teaspoon -- Oral - Honey Cup -- Oral - Nectar Teaspoon -- Oral - Nectar Cup -- Oral - Nectar Straw -- Oral - Thin Teaspoon -- Oral - Thin Cup -- Oral - Thin Straw -- Oral - Puree -- Oral - Mech Soft -- Oral - Regular -- Oral - Multi-Consistency -- Oral - Pill -- Oral Phase - Comment --  CHL IP PHARYNGEAL PHASE 03/15/2017 Pharyngeal Phase Impaired Pharyngeal- Pudding Teaspoon -- Pharyngeal -- Pharyngeal- Pudding Cup -- Pharyngeal -- Pharyngeal- Honey Teaspoon -- Pharyngeal -- Pharyngeal- Honey Cup -- Pharyngeal -- Pharyngeal- Nectar Teaspoon -- Pharyngeal -- Pharyngeal- Nectar Cup -- Pharyngeal -- Pharyngeal- Nectar Straw -- Pharyngeal -- Pharyngeal- Thin Teaspoon -- Pharyngeal -- Pharyngeal- Thin Cup Penetration/Aspiration before swallow;Penetration/Aspiration during swallow Pharyngeal Material enters airway, CONTACTS cords and not ejected out;Material enters airway, CONTACTS cords and then ejected out;Material enters airway, remains ABOVE vocal cords then  ejected out;Material enters airway, remains ABOVE vocal cords and not ejected out;Material does not enter airway Pharyngeal- Thin Straw Penetration/Aspiration before swallow;Penetration/Aspiration during swallow Pharyngeal Material enters airway, CONTACTS cords and not ejected out;Material enters airway, CONTACTS cords and then ejected out;Material enters airway, remains ABOVE vocal cords then ejected out;Material enters airway, remains ABOVE vocal cords and not ejected out;Material does not enter airway Pharyngeal- Puree WFL Pharyngeal -- Pharyngeal- Mechanical Soft -- Pharyngeal -- Pharyngeal- Regular WFL Pharyngeal -- Pharyngeal- Multi-consistency -- Pharyngeal -- Pharyngeal- Pill WFL Pharyngeal -- Pharyngeal Comment --  No flowsheet data found. CHL IP GO 03/15/2017 Functional Assessment Tool Used clinical judgement Functional Limitations Swallowing Swallow Current Status [phone]) CI Swallow Goal Status (E5631) CI Swallow Discharge Status (S9702) CI Motor Speech Current Status (O3785) (None) Motor Speech Goal Status (Y8502) (None) Motor Speech Goal Status (D7412) (None) Spoken Language Comprehension Current Status (I7867) (None) Spoken Language Comprehension Goal Status (E7209) (None) Spoken Language Comprehension Discharge Status [phone]) (None) Spoken Language Expression Current Status [phone]) (None) Spoken Language Expression Goal Status (Q9476) (None) Spoken Language Expression Discharge Status [phone]) (None) Attention Current Status (P5465) (None) Attention Goal Status (K8127) (None) Attention Discharge Status (N1700) (None) Memory Current Status (F7494) (None) Memory Goal Status (W9675) (None) Memory Discharge Status (F1638) (None) Voice Current Status (G6659) (None) Voice Goal Status (D3570) (None) Voice Discharge Status (V7793) (None) Other Speech-Language Pathology  Functional Limitation Current Status [phone]) (None) Other Speech-Language Pathology Functional Limitation Goal Status (E2683) (None) Other  Speech-Language Pathology Functional Limitation Discharge Status [phone]) (None) DeBlois, Katherene Ponto 03/15/2017, 1:31 PM            CLINICAL DATA:  Dysphagia. Recurrent pneumonia. Left thyroid nodule. EXAM: MODIFIED BARIUM SWALLOW TECHNIQUE: Different consistencies of barium were administered orally to the patient by the Speech Pathologist. Imaging of the pharynx was performed in the lateral projection. FLUOROSCOPY TIME:  Fluoroscopy Time:  1 minutes and 20 seconds. Radiation Exposure Index (if provided by the fluoroscopic device): NA Number of Acquired Spot Images: 0 COMPARISON:  Chest CT 12/30/2016. FINDINGS: Thin liquid- flash penetration without aspiration. Otherwise normal. Nectar thick liquid- not administered. Honey- not administered. Pure- within normal limits Cracker-not administered. Pure with cracker- not administered. Barium tablet -  within normal limits IMPRESSION: Flash penetration with thin barium. No aspiration. No extrinsic mass effect on the proximal esophagus by the thyroid nodule demonstrated on lateral projection imaging. Please refer to the Speech Pathologists report for complete details and recommendations. Electronically Signed   By: Richardean Sale M.D.   On: 03/15/2017 11:44   Xr Sacrum/coccyx  Result Date: 03/15/2017 AP and lateral radiographs of the coccyx shows no deformity. Patient does have calcification of the aorta but this is 21 mm in diameter no evidence of aneurysm.   Labs: Lab Results  Component Value Date   HGBA1C 6.0 (H) 02/05/2016   REPTSTATUS 02/13/2017 FINAL 02/11/2017   CULT 20,000 COLONIES/mL ESCHERICHIA COLI (A) 02/11/2017   LABORGA ESCHERICHIA COLI (A) 02/11/2017    Orders:  Orders Placed This Encounter  Procedures  . XR Sacrum/Coccyx   No orders of the defined types were placed in this encounter.    Procedures: No procedures performed  Clinical Data: No additional findings.  ROS:  All other systems negative, except as noted in the  HPI. Review of Systems  Objective: Vital Signs: Ht 5\' 5"  (1.651 m)   Wt 178 lb (80.7 kg)   BMI 29.62 kg/m   Specialty Comments:  No specialty comments available.  PMFS History: Patient Active Problem List   Diagnosis Date Noted  . Dysphagia   . Abnormal urine sediment 02/11/2017  . Upper airway cough syndrome 01/06/2017  . Acute on chronic diastolic heart failure ([place])   . Atrial fibrillation ([place]) 09/15/2016  . Atrial fibrillation with RVR ([place]) 09/15/2016  . Near syncope 02/05/2016  . Pre-syncope 02/05/2016  . Chest pain   . Diabetes mellitus with complication ([place])   . Hypokalemia 08/28/2015  . UTI (lower urinary tract infection) 08/26/2015  . Generalized weakness 08/26/2015  . Acute combined systolic and diastolic heart failure ([place]) 02/16/2015  . Hyperthyroidism 01/28/2015  . Dyspnea 01/27/2015  . SOB (shortness of breath) 01/27/2015  . Failure to thrive in adult 01/27/2015  . Benign essential HTN 01/27/2015  . DM (diabetes mellitus) type II controlled with renal manifestation ([place]) 01/27/2015  . CKD (chronic kidney disease), stage III 01/27/2015   Past Medical History:  Diagnosis Date  . Alzheimer's disease   . Atrial fibrillation ([place])   . Bronchitis   . CHF (congestive heart failure) ([place])   . Chronic renal insufficiency, stage III (moderate)   . Diabetes mellitus without complication ([place])   . Dyspnea    with exertion  . GERD (gastroesophageal reflux disease)   . GI bleeding 11/2013   due to supratherapeutic INR  . Hyperlipidemia   . Hypertension   . Hyperthyroidism  on amiodarone  . LBBB (left bundle branch block)   . LBBB (left bundle branch block)   . Overactive bladder   . Overweight   . Persistent atrial fibrillation ([place])   . Pneumonia   . Thyroid mass    LLL  . UTI (urinary tract infection)   . Vitamin B12 deficiency   . Vitamin D deficiency     Family History  Problem Relation Age of Onset  . Heart disease Father   . Diabetes  Mother   . Thyroid disease Neg Hx     Past Surgical History:  Procedure Laterality Date  . ABDOMINAL HYSTERECTOMY    . ESOPHAGOGASTRODUODENOSCOPY (EGD) WITH PROPOFOL N/A 02/16/2017   Procedure: ESOPHAGOGASTRODUODENOSCOPY (EGD) WITH PROPOFOL;  Surgeon: Irene Shipper, MD;  Location[facility] ENDOSCOPY;  Service: Endoscopy;  Laterality: N/A;   Social History   Occupational History  . Not on file.   Social History Main Topics  . Smoking status: Never Smoker  . Smokeless tobacco: Never Used  . Alcohol use No  . Drug use: No  . Sexual activity: Not on file

## 2017-03-15 NOTE — Telephone Encounter (Signed)
I did not refer her, but I am fine with her going

## 2017-03-15 NOTE — Telephone Encounter (Signed)
Per Tommi Rumps, ok to place referral to Dr. Sharol Given for coccyx pain.

## 2017-03-16 NOTE — Addendum Note (Signed)
Addended by: Pamella Pert on: 03/16/2017 08:34 AM   Modules accepted: Orders

## 2017-03-20 ENCOUNTER — Ambulatory Visit (INDEPENDENT_AMBULATORY_CARE_PROVIDER_SITE_OTHER): Payer: Medicare HMO

## 2017-03-20 ENCOUNTER — Encounter (INDEPENDENT_AMBULATORY_CARE_PROVIDER_SITE_OTHER): Payer: Self-pay | Admitting: Physical Medicine and Rehabilitation

## 2017-03-20 ENCOUNTER — Ambulatory Visit (INDEPENDENT_AMBULATORY_CARE_PROVIDER_SITE_OTHER): Payer: Medicare HMO | Admitting: Physical Medicine and Rehabilitation

## 2017-03-20 VITALS — BP 147/90

## 2017-03-20 DIAGNOSIS — M533 Sacrococcygeal disorders, not elsewhere classified: Secondary | ICD-10-CM

## 2017-03-20 NOTE — Patient Instructions (Signed)

## 2017-03-20 NOTE — Progress Notes (Deleted)
Coccyx pain for around 8 months. Leans forward to help with the pain and has fallen out of her chair several times.

## 2017-03-20 NOTE — Progress Notes (Unsigned)
Fluoro Time: 33 sec MGY: 19.52

## 2017-03-21 DIAGNOSIS — M533 Sacrococcygeal disorders, not elsewhere classified: Secondary | ICD-10-CM | POA: Diagnosis not present

## 2017-03-21 MED ORDER — BUPIVACAINE HCL 0.5 % IJ SOLN
2.0000 mL | INTRAMUSCULAR | Status: AC | PRN
  Administered 2017-03-21: 2 mL via INTRA_ARTICULAR

## 2017-03-21 MED ORDER — METHYLPREDNISOLONE ACETATE 80 MG/ML IJ SUSP
80.0000 mg | INTRAMUSCULAR | Status: AC | PRN
  Administered 2017-03-21: 80 mg via INTRA_ARTICULAR

## 2017-03-21 NOTE — Progress Notes (Signed)
Anna [name] - 83 y.o. female MRN [id number]  Date of birth: 1928-09-09  Office Visit Note: Visit Date: 03/20/2017 PCP: Dorothyann Peng, NP Referred by: Dorothyann Peng, NP  Subjective: No chief complaint on file.  HPI: Anna [name] is an 83 year old female who was recently evaluated by Dr. Sharol Given who requested coccyx injection fluoroscopic guidance from a diagnostic and hopefully therapeutic standpoint for her ongoing tailbone and coccyx pain. She is accompanied by her daughter who provides the history as the patient does have significant dementia. The patient can be aroused and alert and will respond to questioning briefly. The daughter is primary caregiver and power of attorney.    ROS Otherwise per HPI.  Assessment & Plan: Visit Diagnoses:  1. Coccydynia     Plan: Findings:  Diagnostic and hopefully therapeutic coccyx injection with fluoroscopic guidance. There were no issues with the injection and the patient tolerated well.    Meds & Orders: No orders of the defined types were placed in this encounter.   Orders Placed This Encounter  Procedures  . Large Joint Injection/Arthrocentesis  . XR C-ARM NO REPORT    Follow-up: Return if symptoms worsen or fail to improve, for Dr. Sharol Given.   Procedures: Fluoroscopically guided coccyx injection Date/Time: 03/21/2017 6:22 AM Performed by: Magnus Sinning Authorized by: Magnus Sinning   Consent Given by:  Patient and power of attorney Site marked: the procedure site was marked   Timeout: prior to procedure the correct patient, procedure, and site was verified   Indications:  Pain and diagnostic evaluation Location:  Hip (Coccyx) Hip joint: Coccyx. Prep: patient was prepped and draped in usual sterile fashion   Needle Size:  22 G Needle Length:  3.5 inches Approach:  Posterior Ultrasound Guidance: No   Fluoroscopic Guidance: Yes   Arthrogram: No   Medications:  80 mg methylPREDNISolone acetate 80 MG/ML; 2 mL bupivacaine  0.5 % Aspiration Attempted: No   Patient tolerance:  Patient tolerated the procedure well with no immediate complications  Biplanar fluoroscopic guidance was utilized to position the needle at the level of the coccyx at the C1-2 disc area. There was excellent flow of contrast around this area without intravascular flow.    No notes on file   Clinical History: No specialty comments available.  She reports that she has never smoked. She has never used smokeless tobacco. No results for input(s): HGBA1C, LABURIC in the last 8760 hours.  Objective:  VS:  HT:    WT:   BMI:     BP:(!) 147/90  HR: bpm  TEMP: ( )  RESP:  Physical Exam  Musculoskeletal:  Patient can stand from a seated position. She does have pain over the tailbone region. There were no ulcerations or bruising.  Psychiatric:  Patient with late stages of dementia is somewhat responsive to questioning and does understand the injection Y were doing it.    Ortho Exam Imaging: Xr C-arm No Report  Result Date: 03/20/2017 Please see Notes or Procedures tab for imaging impression.   Past Medical/Family/Surgical/Social History: Medications & Allergies reviewed per EMR Patient Active Problem List   Diagnosis Date Noted  . Dysphagia   . Abnormal urine sediment 02/11/2017  . Upper airway cough syndrome 01/06/2017  . Acute on chronic diastolic heart failure ([place])   . Atrial fibrillation ([place]) 09/15/2016  . Atrial fibrillation with RVR ([place]) 09/15/2016  . Near syncope 02/05/2016  . Pre-syncope 02/05/2016  . Chest pain   . Diabetes mellitus with complication ([place])   .  Hypokalemia 08/28/2015  . UTI (lower urinary tract infection) 08/26/2015  . Generalized weakness 08/26/2015  . Acute combined systolic and diastolic heart failure ([place]) 02/16/2015  . Hyperthyroidism 01/28/2015  . Dyspnea 01/27/2015  . SOB (shortness of breath) 01/27/2015  . Failure to thrive in adult 01/27/2015  . Benign essential HTN 01/27/2015  . DM  (diabetes mellitus) type II controlled with renal manifestation ([place]) 01/27/2015  . CKD (chronic kidney disease), stage III 01/27/2015   Past Medical History:  Diagnosis Date  . Alzheimer's disease   . Atrial fibrillation ([place])   . Bronchitis   . CHF (congestive heart failure) ([place])   . Chronic renal insufficiency, stage III (moderate)   . Diabetes mellitus without complication ([place])   . Dyspnea    with exertion  . GERD (gastroesophageal reflux disease)   . GI bleeding 11/2013   due to supratherapeutic INR  . Hyperlipidemia   . Hypertension   . Hyperthyroidism    on amiodarone  . LBBB (left bundle branch block)   . LBBB (left bundle branch block)   . Overactive bladder   . Overweight   . Persistent atrial fibrillation ([place])   . Pneumonia   . Thyroid mass    LLL  . UTI (urinary tract infection)   . Vitamin B12 deficiency   . Vitamin D deficiency    Family History  Problem Relation Age of Onset  . Heart disease Father   . Diabetes Mother   . Thyroid disease Neg Hx    Past Surgical History:  Procedure Laterality Date  . ABDOMINAL HYSTERECTOMY    . ESOPHAGOGASTRODUODENOSCOPY (EGD) WITH PROPOFOL N/A 02/16/2017   Procedure: ESOPHAGOGASTRODUODENOSCOPY (EGD) WITH PROPOFOL;  Surgeon: Irene Shipper, MD;  Location: [facility] ENDOSCOPY;  Service: Endoscopy;  Laterality: N/A;   Social History   Occupational History  . Not on file.   Social History Main Topics  . Smoking status: Never Smoker  . Smokeless tobacco: Never Used  . Alcohol use No  . Drug use: No  . Sexual activity: Not on file

## 2017-03-22 ENCOUNTER — Encounter: Payer: Self-pay | Admitting: Adult Health

## 2017-03-22 ENCOUNTER — Telehealth: Payer: Self-pay | Admitting: Cardiovascular Disease

## 2017-03-22 ENCOUNTER — Ambulatory Visit (INDEPENDENT_AMBULATORY_CARE_PROVIDER_SITE_OTHER): Payer: Medicare HMO | Admitting: Adult Health

## 2017-03-22 VITALS — BP 136/72 | HR 77 | Temp 97.9°F

## 2017-03-22 DIAGNOSIS — G301 Alzheimer's disease with late onset: Secondary | ICD-10-CM | POA: Diagnosis not present

## 2017-03-22 DIAGNOSIS — F0280 Dementia in other diseases classified elsewhere without behavioral disturbance: Secondary | ICD-10-CM

## 2017-03-22 DIAGNOSIS — E559 Vitamin D deficiency, unspecified: Secondary | ICD-10-CM | POA: Diagnosis not present

## 2017-03-22 DIAGNOSIS — E538 Deficiency of other specified B group vitamins: Secondary | ICD-10-CM | POA: Diagnosis not present

## 2017-03-22 DIAGNOSIS — E119 Type 2 diabetes mellitus without complications: Secondary | ICD-10-CM | POA: Diagnosis not present

## 2017-03-22 DIAGNOSIS — Z79899 Other long term (current) drug therapy: Secondary | ICD-10-CM

## 2017-03-22 DIAGNOSIS — E876 Hypokalemia: Secondary | ICD-10-CM

## 2017-03-22 LAB — BASIC METABOLIC PANEL
BUN: 16 mg/dL (ref 6–23)
CALCIUM: 9.5 mg/dL (ref 8.4–10.5)
CO2: 29 meq/L (ref 19–32)
CREATININE: 1.04 mg/dL (ref 0.40–1.20)
Chloride: 105 mEq/L (ref 96–112)
GFR: 53.35 mL/min — ABNORMAL LOW (ref >60.00)
GLUCOSE: 176 mg/dL — AB (ref 70–99)
Potassium: 3.4 mEq/L — ABNORMAL LOW (ref 3.5–5.1)
Sodium: 141 mEq/L (ref 135–145)

## 2017-03-22 LAB — VITAMIN B12: Vitamin B-12: 573 pg/mL (ref 211–911)

## 2017-03-22 LAB — HEMOGLOBIN A1C: Hgb A1c MFr Bld: 5.6 % (ref 4.6–6.5)

## 2017-03-22 LAB — VITAMIN D 25 HYDROXY (VIT D DEFICIENCY, FRACTURES): VITD: 105.59 ng/mL (ref 30.00–100.00)

## 2017-03-22 MED ORDER — MEMANTINE HCL 5 MG PO TABS: 5.0000 mg | ORAL_TABLET | Freq: Two times a day (BID) | ORAL | 1 refills | Status: DC

## 2017-03-22 MED ORDER — CARBAMIDE PEROXIDE 6.5 % OT SOLN
5.0000 [drp] | Freq: Two times a day (BID) | OTIC | 1 refills | Status: DC
Start: 2017-03-22 — End: 2018-02-07

## 2017-03-22 NOTE — Telephone Encounter (Signed)
Advised daughter and she stated she has tablets and will just use those. Advised if any problems with the tablets call back and will get her caps

## 2017-03-22 NOTE — Telephone Encounter (Signed)
Patient daughter Anna [name]) calling, states that her mother had blood work completed today and her potassium was low at 3.4. Anna [name] states that range was 3.5-5.1. Patient was advised to see if Dr. Oval Linsey wanted her to start on a potassium pill, please call to discuss with [name].

## 2017-03-22 NOTE — Progress Notes (Signed)
Subjective:    Patient ID: Anna [name], female    DOB: July 20, 1929, 83 y.o.   MRN: [id number]  HPI  83 year old female who  has a past medical history of Alzheimer's disease; Atrial fibrillation ([place]); Bronchitis; CHF (congestive heart failure) ([place]); Chronic renal insufficiency, stage III (moderate); Diabetes mellitus without complication ([place]); Dyspnea; GERD (gastroesophageal reflux disease); GI bleeding (11/2013); Hyperlipidemia; Hypertension; Hyperthyroidism; LBBB (left bundle branch block); LBBB (left bundle branch block); Overactive bladder; Overweight; Persistent atrial fibrillation ([place]); Pneumonia; Thyroid mass; UTI (urinary tract infection); Vitamin B12 deficiency; and Vitamin D deficiency.   She presents with her daughter ( as patient has dementia and is pretty much nonverbal) to this visit for three month follow up. Her daughter reports that her mother appears to be in less pain after a steroid injection to the coccyx was performed.   She also reports that she was seen by her cardiologist Dr. Oval Linsey about a week ago. At that appointment Lasix was increased to 30 mg. Since increasing lasix, her daughter feels as though Terrianna has less wheezing, SOB, and does not appear to be choking as much as she did previously.   Her daughter would like to have Azalynn's A1c checked as well as a BMP due to increasing lasix, a Vitamin D level and Vitamin B level due to being on these supplements.   Her daughter would also like to try Namenda to see if this helps with her mothers dementia   Review of Systems  Unable to perform ROS: Dementia   Past Medical History:  Diagnosis Date  . Alzheimer's disease   . Atrial fibrillation ([place])   . Bronchitis   . CHF (congestive heart failure) ([place])   . Chronic renal insufficiency, stage III (moderate)   . Diabetes mellitus without complication ([place])   . Dyspnea    with exertion  . GERD (gastroesophageal reflux disease)   . GI bleeding 11/2013   due to  supratherapeutic INR  . Hyperlipidemia   . Hypertension   . Hyperthyroidism    on amiodarone  . LBBB (left bundle branch block)   . LBBB (left bundle branch block)   . Overactive bladder   . Overweight   . Persistent atrial fibrillation ([place])   . Pneumonia   . Thyroid mass    LLL  . UTI (urinary tract infection)   . Vitamin B12 deficiency   . Vitamin D deficiency     Social History   Social History  . Marital status: Divorced    Spouse name: N/A  . Number of children: N/A  . Years of education: N/A   Occupational History  . Not on file.   Social History Main Topics  . Smoking status: Never Smoker  . Smokeless tobacco: Never Used  . Alcohol use No  . Drug use: No  . Sexual activity: Not on file   Other Topics Concern  . Not on file   Social History Narrative   Pt recently moved to [place] with daughter from [place]       Past Surgical History:  Procedure Laterality Date  . ABDOMINAL HYSTERECTOMY    . ESOPHAGOGASTRODUODENOSCOPY (EGD) WITH PROPOFOL N/A 02/16/2017   Procedure: ESOPHAGOGASTRODUODENOSCOPY (EGD) WITH PROPOFOL;  Surgeon: Irene Shipper, MD;  Location: [facility] ENDOSCOPY;  Service: Endoscopy;  Laterality: N/A;    Family History  Problem Relation Age of Onset  . Heart disease Father   . Diabetes Mother   . Thyroid disease Neg Hx  Allergies  Allergen Reactions  . Pollen Extract     Sneezing, watery eyes    Current Outpatient Prescriptions on File Prior to Visit  Medication Sig Dispense Refill  . ACCU-CHEK AVIVA PLUS test strip PATIENT TO CHECK SUGARS ONCE DAILY.  3  . acetaminophen (TYLENOL) 500 MG tablet Take 500 mg by mouth every 6 (six) hours as needed for fever.     Marland Kitchen aspirin 325 MG tablet Take 325 mg by mouth daily.    . calcitRIOL (ROCALTROL) 0.5 MCG capsule Take 1 capsule (0.5 mcg total) by mouth 3 (three) times a week. Monday, Wednesday, and Friday only 36 capsule 3  . docusate sodium (COLACE) 100 MG capsule Take 100 mg by mouth daily  as needed for mild constipation.    Marland Kitchen donepezil (ARICEPT) 10 MG tablet TAKE 1 TABLET ONCE DAILY. 90 tablet 3  . fluticasone (FLONASE) 50 MCG/ACT nasal spray Place 2 sprays into both nostrils daily.    . furosemide (LASIX) 20 MG tablet Take 1.5 tablets (30 mg total) by mouth daily. Take an extra 0.5 tablet daily as needed. 60 tablet 5  . glimepiride (AMARYL) 2 MG tablet Take 2 mg by mouth daily with breakfast.    . guaiFENesin (MUCINEX) 600 MG 12 hr tablet Take 600 mg by mouth 2 (two) times daily as needed for cough. Reported on 12/22/2015    . Iron-FA-B Cmp-C-Biot-Probiotic (FUSION PLUS) CAPS Take 1 tablet by mouth daily. 90 capsule 3  . loratadine (ALAVERT) 10 MG tablet Take 5 mg by mouth daily. Every 2 days     . metoprolol tartrate (LOPRESSOR) 25 MG tablet Take 1 tablet (25 mg total) by mouth 2 (two) times daily. 60 tablet 2  . omeprazole (PRILOSEC) 40 MG capsule Take 30- 60 min before your first and last meals of the day 60 capsule 11  . Polyethyl Glycol-Propyl Glycol (SYSTANE) 0.4-0.3 % SOLN Apply 1 drop to eye 2 (two) times daily as needed (dry eyes).    . pravastatin (PRAVACHOL) 40 MG tablet Take 40 mg by mouth daily.    . vitamin B-12 (CYANOCOBALAMIN) 500 MCG tablet Take 500 mcg by mouth daily.    . Vitamin D, Ergocalciferol, (DRISDOL) 50000 units CAPS capsule Take 1 capsule (50,000 Units total) by mouth once a week. Mondays 4 capsule 1   No current facility-administered medications on file prior to visit.     BP 136/72 (BP Location: Right Arm)   Pulse 77   Temp 97.9 F (36.6 C) (Oral)   SpO2 98%       Objective:   Physical Exam  Constitutional: She appears well-developed and well-nourished. No distress.  Cardiovascular: Normal heart sounds and intact distal pulses.  An irregularly irregular rhythm present. Exam reveals no gallop and no friction rub.   No murmur heard. Skin: Skin is warm and dry. No rash noted. She is not diaphoretic. No erythema. No pallor.  Psychiatric: She is  slowed and withdrawn. Cognition and memory are impaired. She is noncommunicative. She is inattentive.  Nursing note and vitals reviewed.     Assessment & Plan:  1. Late onset Alzheimer's disease without behavioral disturbance - I do not see this medication making a significant difference but she is insistent on trying.  - memantine (NAMENDA) 5 MG tablet; Take 1 tablet (5 mg total) by mouth 2 (two) times daily.  Dispense: 30 tablet; Refill: 1  2. Vitamin D deficiency  - Vitamin D, 25-hydroxy  3. Vitamin B 12 deficiency  -  Vitamin B12  4. Controlled type 2 diabetes mellitus without complication, without long-term current use of insulin (HCC) - Hemoglobin P9K - Basic metabolic panel  Dorothyann Peng, NP

## 2017-03-22 NOTE — Telephone Encounter (Signed)
Potassium chloride 20 mEq daily. Capsule is probably better for her than the tab.  BMP in 1 week.

## 2017-03-22 NOTE — Patient Instructions (Signed)
It was great seeing you today   I will follow up with you regarding your blood work   Follow up in one month after starting [place]

## 2017-03-24 ENCOUNTER — Other Ambulatory Visit: Payer: Self-pay | Admitting: Adult Health

## 2017-03-29 DIAGNOSIS — E876 Hypokalemia: Secondary | ICD-10-CM | POA: Diagnosis not present

## 2017-03-29 DIAGNOSIS — Z79899 Other long term (current) drug therapy: Secondary | ICD-10-CM | POA: Diagnosis not present

## 2017-03-30 LAB — BASIC METABOLIC PANEL
BUN/Creatinine Ratio: 16 (ref 12–28)
BUN: 18 mg/dL (ref 8–27)
CALCIUM: 9.9 mg/dL (ref 8.7–10.3)
CHLORIDE: 103 mmol/L (ref 96–106)
CO2: 23 mmol/L (ref 20–29)
Creatinine, Ser: 1.15 mg/dL — ABNORMAL HIGH (ref 0.57–1.00)
GFR calc Af Amer: 50 mL/min/{1.73_m2} — ABNORMAL LOW (ref >59)
GFR, EST NON AFRICAN AMERICAN: 43 mL/min/{1.73_m2} — AB (ref >59)
Glucose: 188 mg/dL — ABNORMAL HIGH (ref 65–99)
POTASSIUM: 3.9 mmol/L (ref 3.5–5.2)
Sodium: 140 mmol/L (ref 134–144)

## 2017-03-31 ENCOUNTER — Telehealth: Payer: Self-pay | Admitting: Cardiovascular Disease

## 2017-03-31 NOTE — Telephone Encounter (Signed)
Spoke with pt dtr, aware of lab results.

## 2017-03-31 NOTE — Telephone Encounter (Signed)
New Message  Pt daughter call requesting to speak  With RN about lab results. Please call back to discuss

## 2017-04-07 ENCOUNTER — Telehealth: Payer: Self-pay | Admitting: *Deleted

## 2017-04-07 DIAGNOSIS — Z79899 Other long term (current) drug therapy: Secondary | ICD-10-CM

## 2017-04-07 NOTE — Telephone Encounter (Signed)
Advised daughter, verbalized understanding  Daughter requested repeat BMET soon

## 2017-04-07 NOTE — Telephone Encounter (Signed)
Just continue with 20 mEq of potassium whether she gives 20 or 30 mg of lasix.

## 2017-04-07 NOTE — Telephone Encounter (Signed)
Patient taking Lasix 30 mg daily of Lasix but daughter thinks it is "wiping her out". Patient was less alert,  sleeping all of the time, and weight down (clothes bigger) Daughter did decrease back to 20 mg daily and she was more alert and slept less. After 20 mg two days she heard the wheezing so she gave 30 mg  Daughter would like to try to give her Lasix 20 mg daily and 30 mg every 3 rd day.  With doing this she is concerned about what dose of potassium she should give Will forward to Dr Oval Linsey for review

## 2017-04-11 ENCOUNTER — Other Ambulatory Visit: Payer: Self-pay | Admitting: Adult Health

## 2017-04-11 NOTE — Telephone Encounter (Signed)
It is ok to refill for one year 

## 2017-04-11 NOTE — Telephone Encounter (Signed)
Sent to the pharmacy by e-scribe for 1 year as instructed

## 2017-04-11 NOTE — Telephone Encounter (Signed)
Cory, do not see that you prescribe this medication.  Please check behind me.

## 2017-04-19 ENCOUNTER — Encounter (HOSPITAL_COMMUNITY): Admission: RE | Payer: Self-pay | Source: Ambulatory Visit

## 2017-04-19 ENCOUNTER — Other Ambulatory Visit: Payer: Self-pay | Admitting: Adult Health

## 2017-04-19 ENCOUNTER — Ambulatory Visit (HOSPITAL_COMMUNITY): Admission: RE | Admit: 2017-04-19 | Payer: Medicare HMO | Source: Ambulatory Visit | Admitting: Surgery

## 2017-04-19 SURGERY — LOBECTOMY, THYROID
Anesthesia: General | Laterality: Left

## 2017-04-19 NOTE — Telephone Encounter (Signed)
Sent to the pharmacy by e-scribe as instructed. 

## 2017-04-19 NOTE — Telephone Encounter (Signed)
Cory, do not see where you have prescribed this medication in the past.  Please advise.

## 2017-04-19 NOTE — Telephone Encounter (Signed)
Ok to refill for one year  

## 2017-04-25 ENCOUNTER — Telehealth: Payer: Self-pay | Admitting: Cardiovascular Disease

## 2017-04-25 DIAGNOSIS — Z79899 Other long term (current) drug therapy: Secondary | ICD-10-CM | POA: Diagnosis not present

## 2017-04-25 NOTE — Telephone Encounter (Signed)
New message     Please call she would like you to order blood work for potassium level , her heart rate was only 44-60,  She is sleeping a lot and they have reduced her medication by half

## 2017-04-25 NOTE — Telephone Encounter (Signed)
Spoke to daughter of patient.  Reports fatigue/sleepiness, sluggishness x several days. Pt having HR variability of 44-60 over weekend whereas it had been more 60s-70s last week. She called over weekend and states she spoke to on-call PA who recommendation a 1/2 dose of her usual 25mg  metoprolol tartrate last night.  Daughter reporting HR in 55s this AM.  Needing to know if pt should hold metoprolol or take reduced dose if she's having bradycardia. She is obtaining HR via pulse ox, and less often by manual pulse check.  Also asking if patient should come in for bloodwork - she doesn't know if pt dehydrated, etc, also wanted to check pt's potassium level. Pt has active BMET order, I advised OK to come in today for labs - will review w Dr. Oval Linsey and get further recommendations and return call.

## 2017-04-26 LAB — BASIC METABOLIC PANEL
BUN / CREAT RATIO: 16 (ref 12–28)
BUN: 20 mg/dL (ref 8–27)
CALCIUM: 9.4 mg/dL (ref 8.7–10.3)
CHLORIDE: 105 mmol/L (ref 96–106)
CO2: 22 mmol/L (ref 20–29)
CREATININE: 1.25 mg/dL — AB (ref 0.57–1.00)
GFR calc Af Amer: 45 mL/min/{1.73_m2} — ABNORMAL LOW (ref >59)
GFR calc non Af Amer: 39 mL/min/{1.73_m2} — ABNORMAL LOW (ref >59)
Glucose: 133 mg/dL — ABNORMAL HIGH (ref 65–99)
Potassium: 4.1 mmol/L (ref 3.5–5.2)
Sodium: 142 mmol/L (ref 134–144)

## 2017-04-26 NOTE — Telephone Encounter (Signed)
If HR is <60, give half dose of metoprolol.

## 2017-04-26 NOTE — Telephone Encounter (Signed)
Spoke with daughter and she has been giving her Metoprolol 25 mg 1/2 tablet twice a day since 04/24/17 Heart rate been running 60-80 Advised to continue

## 2017-04-28 ENCOUNTER — Ambulatory Visit (INDEPENDENT_AMBULATORY_CARE_PROVIDER_SITE_OTHER): Payer: Medicare HMO | Admitting: Cardiovascular Disease

## 2017-04-28 ENCOUNTER — Encounter: Payer: Self-pay | Admitting: Cardiovascular Disease

## 2017-04-28 VITALS — BP 116/70 | HR 84 | Ht 65.0 in

## 2017-04-28 DIAGNOSIS — I5042 Chronic combined systolic (congestive) and diastolic (congestive) heart failure: Secondary | ICD-10-CM

## 2017-04-28 DIAGNOSIS — E7800 Pure hypercholesterolemia, unspecified: Secondary | ICD-10-CM | POA: Diagnosis not present

## 2017-04-28 DIAGNOSIS — Z5181 Encounter for therapeutic drug level monitoring: Secondary | ICD-10-CM

## 2017-04-28 DIAGNOSIS — I10 Essential (primary) hypertension: Secondary | ICD-10-CM

## 2017-04-28 NOTE — Patient Instructions (Signed)
Medication Instructions:  Your physician recommends that you continue on your current medications as directed. Please refer to the Current Medication list given to you today.  Labwork: FASTING LP/CMET SOON   Testing/Procedures: none  Follow-Up: Your physician recommends that you schedule a follow-up appointment in: 3 month ov  If you need a refill on your cardiac medications before your next appointment, please call your pharmacy.

## 2017-04-28 NOTE — Progress Notes (Signed)
Cardiology Office Note   Date:  04/28/2017   ID:  Anna Anna [name], DOB February 02, 1929, MRN [id number]  PCP:  Anna Peng, NP  Cardiologist:   Anna Latch, MD   Chief Complaint  Anna [name] presents with  . Follow-up    3 months;      History of Present Illness: Anna Anna [name] is an 83 y.o. female with chronic systolic and diastolic heart failure, persistent atrial fibrillation, prior GI bleed, hypertensive heart disease, diabetes, and CKD III who presents for follow up.  Anna Anna [name] was hospitalized 08/2016 with atrial fibrillation with RVR and volume overloaded.  During that hospitalization she had an echocardiogram that revealed LVEF 35-40% with diffuse hypokinesis.  This was reduced from her echo 08/2015 that revealed LVEF 45-50%.  Prior to admission she was treated as an outpatient for pneumonia.  Her metoprolol was increased to 12.5mg  bid with improved rate control.  She was mildly volume overloaded and diuresed with IV lasix.  Prior to her hospitalization Anna Anna [name] had been taken off metoprolol due to hypotension and weakness.  Her daughter monitors her heart rate and BP closely.  Lately her heart rate has been as low as 44 bpm.  When it is this low she seems more fatigued and less responsive.   She has reduced metoprolol to 12.5mg  and it has been in 70s-80s.  Her breathing has been stable.  She takes lasix 20mg  one day alternating with 30mg .   Past Medical History:  Diagnosis Date  . Alzheimer's disease   . Atrial fibrillation ([place])   . Bronchitis   . CHF (congestive heart failure) ([place])   . Chronic renal insufficiency, stage III (moderate)   . Diabetes mellitus without complication ([place])   . Dyspnea    with exertion  . GERD (gastroesophageal reflux disease)   . GI bleeding 11/2013   due to supratherapeutic INR  . Hyperlipidemia   . Hypertension   . Hyperthyroidism    on amiodarone  . LBBB (left bundle branch block)   . LBBB (left bundle branch block)   . Overactive  bladder   . Overweight   . Persistent atrial fibrillation ([place])   . Pneumonia   . Thyroid mass    LLL  . UTI (urinary tract infection)   . Vitamin B12 deficiency   . Vitamin D deficiency     Past Surgical History:  Procedure Laterality Date  . ABDOMINAL HYSTERECTOMY    . ESOPHAGOGASTRODUODENOSCOPY (EGD) WITH PROPOFOL N/A 02/16/2017   Procedure: ESOPHAGOGASTRODUODENOSCOPY (EGD) WITH PROPOFOL;  Surgeon: Irene Shipper, MD;  Location: [facility] ENDOSCOPY;  Service: Endoscopy;  Laterality: N/A;     Current Outpatient Prescriptions  Medication Sig Dispense Refill  . ACCU-CHEK AVIVA PLUS test strip Anna [name] TO CHECK SUGARS ONCE DAILY.  3  . acetaminophen (TYLENOL) 500 MG tablet Take 500 mg by mouth every 6 (six) hours as needed for fever.     Marland Kitchen aspirin 325 MG tablet Take 325 mg by mouth daily.    . calcitRIOL (ROCALTROL) 0.5 MCG capsule Take 1 capsule (0.5 mcg total) by mouth 3 (three) times a week. Monday, Wednesday, and Friday only 36 capsule 3  . carbamide peroxide (DEBROX) 6.5 % OTIC solution Place 5 drops into both ears 2 (two) times daily. 15 mL 1  . docusate sodium (COLACE) 100 MG capsule Take 100 mg by mouth daily as needed for mild constipation.    Marland Kitchen donepezil (ARICEPT) 10 MG tablet TAKE 1 TABLET ONCE DAILY. 90 tablet  3  . fluticasone (FLONASE) 50 MCG/ACT nasal spray Place 2 sprays into both nostrils daily.    . furosemide (LASIX) 20 MG tablet Take 1.5 tablets (30 mg total) by mouth daily. Take an extra 0.5 tablet daily as needed. 60 tablet 5  . glimepiride (AMARYL) 2 MG tablet TAKE 1 TABLET ONCE DAILY. 90 tablet 3  . guaiFENesin (MUCINEX) 600 MG 12 hr tablet Take 600 mg by mouth 2 (two) times daily as needed for cough. Reported on 12/22/2015    . Iron-FA-B Cmp-C-Biot-Probiotic (FUSION PLUS) CAPS Take 1 tablet by mouth daily. 90 capsule 3  . loratadine (ALAVERT) 10 MG tablet Take 5 mg by mouth daily. Every 2 days     . memantine (NAMENDA) 5 MG tablet Take 1 tablet (5 mg total) by mouth 2  (two) times daily. 30 tablet 1  . metoprolol tartrate (LOPRESSOR) 25 MG tablet Take 25 mg by mouth as directed. Take 1/2 tablet by mouth twice a day    . omeprazole (PRILOSEC) 40 MG capsule Take 30- 60 min before your first and last meals of Anna day 60 capsule 11  . Polyethyl Glycol-Propyl Glycol (SYSTANE) 0.4-0.3 % SOLN Apply 1 drop to eye 2 (two) times daily as needed (dry eyes).    . potassium chloride SA (K-DUR,KLOR-CON) 20 MEQ tablet Take 20 mEq by mouth daily.    . pravastatin (PRAVACHOL) 40 MG tablet Take 40 mg by mouth daily.    . vitamin B-12 (CYANOCOBALAMIN) 500 MCG tablet Take 500 mcg by mouth daily.     No current facility-administered medications for this visit.     Allergies:   Pollen extract    Social History:  Anna Anna [name]  reports that she has never smoked. She has never used smokeless tobacco. She reports that she does not drink alcohol or use drugs.   Family History:  Anna Anna [name]'s family history includes Diabetes in her mother; Heart disease in her father.    ROS:  Unable to obtain ROS due to dementia.    PHYSICAL EXAM: VS:  BP 116/70   Pulse 84   Ht 5\' 5"  (1.651 m)  , BMI There is no height or weight on file to calculate BMI.  Anna [name]. GENERAL:  Well-appearing.  No acute distress.  Minimally interactive.  Repeatedly leaning forward out of her wheelchair HEENT:  Pupils equal round and reactive, fundi not visualized, oral mucosa unremarkable NECK:  No jugular venous distention, waveform within normal limits, carotid upstroke brisk and symmetric, no bruits LUNGS:  Clear to auscultation bilaterally.  No crackles, wheezes or rhonchi. HEART:  Irregularly irregular.  PMI not displaced or sustained,S1 and S2 within normal limits, no S3, no S4, no clicks, no rubs, no murmurs ABD:  Flat, positive bowel sounds normal in frequency in pitch, no bruits, no rebound, no guarding, no midline pulsatile mass, no hepatomegaly, no splenomegaly EXT:  2 plus  pulses throughout, no edema, no cyanosis no clubbing SKIN:  No rashes no nodules NEURO:  Dementia.  Unable to cooperate with full neuro exam. PSYCH:  Oriented to self only.   EKG:  EKG is not ordered today.   Echo 09/16/16: Study Conclusions  - Left ventricle: Anna cavity size was normal. Wall thickness was   normal. Systolic function was moderately reduced. Anna estimated   ejection fraction was in Anna range of 35% to 40%. Moderate   diffuse hypokinesis with no identifiable regional variations.   Acoustic contrast opacification revealed  no evidence ofthrombus. - Ventricular septum: Septal motion showed abnormal function,   dyssynergy, and paradox. These changes are consistent with a left   bundle branch block. - Left atrium: Anna atrium was mildly dilated.  Recent Labs: 09/15/2016: Magnesium 2.0 12/30/2016: ALT 9; B Natriuretic Peptide 108.5; TSH 0.421 02/08/2017: Hemoglobin 12.1; Platelets 189.0 04/25/2017: BUN 20; Creatinine, Ser 1.25; Potassium 4.1; Sodium 142    Lipid Panel No results found for: CHOL, TRIG, HDL, CHOLHDL, VLDL, LDLCALC, LDLDIRECT    Wt Readings from Last 3 Encounters:  03/15/17 80.7 kg (178 lb)  03/10/17 80.7 kg (178 lb)  03/09/17 81.6 kg (180 lb)      ASSESSMENT AND PLAN:  # Persistent atrial fibrillation: Anna Anna [name] remains in atrial fibrillation. Heart rate is stable now that she has reduced metoprolol to 12.5mg  bid.  No anticoagulation 2/2 prior GI bleed and fall risk.   # Chronic systolic and diastolic heart failure: Anna Anna [name] is currently euvolemic. Continue lasix 20mg  qod and 30mg  qod.  Continue potassium supplementation and lasix as above.  BP has been low on ACE-I/ARB.  # Hyperlipidemia:  Continue pravastatin.  Check lipids and CMP.  Ideally we will reduce or eliminate this medication.  It may affect memory and is unlikely to imrpove outcomes.       Current medicines are reviewed at length with Anna Anna [name] today.  Anna Anna [name] does not have  concerns regarding medicines.  Anna following changes have been made:  none  Labs/ tests ordered today include:   Orders Placed This Encounter  Procedures  . Comprehensive metabolic panel  . Lipid panel     Disposition:   FU with Donta Mcinroy C. Oval Linsey, MD, [facility] in 3 months  Time spent: 30 minutes-Greater than 50% of this time was spent in counseling, explanation of diagnosis, planning of further management, and coordination of care.    Signed, Kyser Wandel C. Oval Linsey, MD, [facility]  04/28/2017 12:57 PM    [name]

## 2017-05-01 ENCOUNTER — Telehealth (INDEPENDENT_AMBULATORY_CARE_PROVIDER_SITE_OTHER): Payer: Self-pay | Admitting: Physical Medicine and Rehabilitation

## 2017-05-01 NOTE — Telephone Encounter (Signed)
Scheduled for 9/20 at 1515. Per referral from July, no auth required.

## 2017-05-01 NOTE — Telephone Encounter (Signed)
Yes ok, fyi significant dementia daughter talks to her

## 2017-05-09 DIAGNOSIS — N3020 Other chronic cystitis without hematuria: Secondary | ICD-10-CM | POA: Diagnosis not present

## 2017-05-11 ENCOUNTER — Ambulatory Visit (INDEPENDENT_AMBULATORY_CARE_PROVIDER_SITE_OTHER): Payer: Medicare HMO | Admitting: Physical Medicine and Rehabilitation

## 2017-05-11 ENCOUNTER — Ambulatory Visit (INDEPENDENT_AMBULATORY_CARE_PROVIDER_SITE_OTHER): Payer: Medicare HMO

## 2017-05-11 DIAGNOSIS — M533 Sacrococcygeal disorders, not elsewhere classified: Secondary | ICD-10-CM

## 2017-05-11 MED ORDER — LIDOCAINE HCL (PF) 1 % IJ SOLN
2.0000 mL | Freq: Once | INTRAMUSCULAR | Status: AC
  Administered 2017-05-11: 2 mL

## 2017-05-11 MED ORDER — METHYLPREDNISOLONE ACETATE 80 MG/ML IJ SUSP
80.0000 mg | Freq: Once | INTRAMUSCULAR | Status: AC
  Administered 2017-05-11: 80 mg

## 2017-05-11 NOTE — Progress Notes (Deleted)
Coccyx pain. Patients daughter states she did well with the last injection for over a month .Increased pain the last 2 weeks.

## 2017-05-11 NOTE — Patient Instructions (Signed)

## 2017-05-12 ENCOUNTER — Encounter: Payer: Self-pay | Admitting: Adult Health

## 2017-05-21 NOTE — Procedures (Signed)
Anna [name] is an 83 year old female with substantial dementia. Her daughter is present who is the primary caregiver and medical power of attorney. Patient was having significant severe coccyx pain when we saw her on 03/20/2017. We completed coccyx injection with fluoroscopic guidance at the request of Dr. Sharol Given. The patient did extremely well. The patient's daughter states that she was sitting better and doing much better relief for over a month with almost no complaints of her sacral pain. Over the last couple weeks she's had increasing symptoms. She still better than she was overall. Trying to talk with her today she is somewhat nonverbal but she does indicate that she did get relief last time. Repeat the injection 1. Depending on her relief however I think she would do well with continued care with positioning and padding. She would likely do well with reevaluation from a occupational and physical therapist for seating evaluation and cueing for pressure relief. Examination today reveals no sacral pressure wounds or sores or other skin irritation. Coccyx (C1-2) Injection with Fluoroscopic Guidance  Patient: Anna [name]      Date of Birth: 14-Mar-1929 MRN: [id number] PCP: Dorothyann Peng, NP      Visit Date: 05/11/2017   Universal Protocol:    Date/Time: 05/21/1809:34 PM  Consent Given By: the patient  Position: PRONE  Additional Comments: Vital signs were monitored before and after the procedure. Patient was prepped and draped in the usual sterile fashion. The correct patient, procedure, and site was verified.   Injection Procedure Details:  Procedure Site One Meds Administered:  Meds ordered this encounter  Medications  . lidocaine (PF) (XYLOCAINE) 1 % injection 2 mL  . methylPREDNISolone acetate (DEPO-MEDROL) injection 80 mg    Laterality: Midline  Location/Site:  Coccyx  Needle size: 22 G  Needle type: spinal needle  Needle Placement: C1-2  junction  Findings:  -Contrast Used: 0.5 mL iohexol 180 mg iodine/mL   -Comments: Excellent flow of contrast producing a partial arthrogram.  Procedure Details: Using a posterior approach with the fluoroscope beam in a perpendicular to the table AP position, the region overlying the coccyx was localized under fluoroscopic visualization. The soft tissues overlying this structure were infiltrated with 1 ml. of 1% Lidocaine without Epinephrine. A #22 gauge, spinal needle was advanced until contact with periosteum using fluoroscopic guidance. The needle was "walked off" into the C1-2 vestigial disc if visualized.      Position in the disc and on the coccyx was confirmed by bi-planar imaging and using a 1 ml. volume of Isovue-250 contrast agent. After negative aspirate for gross pus, stool or blood, the injectate was delivered. Radiographs were obtained for documentation purposes.    Additional Comments:  The patient tolerated the procedure well Dressing: Band-Aid    Post-procedure details: Patient was observed during the procedure. Post-procedure instructions were reviewed.  Patient left the clinic in stable condition.

## 2017-05-22 ENCOUNTER — Other Ambulatory Visit: Payer: Self-pay | Admitting: Adult Health

## 2017-05-24 ENCOUNTER — Other Ambulatory Visit: Payer: Self-pay | Admitting: Adult Health

## 2017-05-24 ENCOUNTER — Telehealth: Payer: Self-pay | Admitting: Adult Health

## 2017-05-24 DIAGNOSIS — G301 Alzheimer's disease with late onset: Principal | ICD-10-CM

## 2017-05-24 DIAGNOSIS — F0280 Dementia in other diseases classified elsewhere without behavioral disturbance: Secondary | ICD-10-CM

## 2017-05-24 MED ORDER — PRAVASTATIN SODIUM 40 MG PO TABS: 40.0000 mg | ORAL_TABLET | Freq: Every day | ORAL | 3 refills | Status: DC

## 2017-05-24 NOTE — Telephone Encounter (Signed)
Medication sent to pharmacy  

## 2017-05-24 NOTE — Telephone Encounter (Signed)
Sent to the pharmacy by e-scribe for 1 month.  Pt is past due for follow up of Namenda.  Will send a message to scheduling.

## 2017-05-24 NOTE — Telephone Encounter (Signed)
Pts daughter is calling needing a refill memantine and would like to have clarification the dosage.  Pts daughter would like to have a call before noon or after 1:00.

## 2017-05-24 NOTE — Telephone Encounter (Signed)
Cory, I do not see that you have prescribed this in the past.  Please advise.  Thanks!! 

## 2017-05-24 NOTE — Telephone Encounter (Signed)
Pt should take 1 tablet twice daily.  Sent to the pharmacy by e-scribe.  She is past due for her follow up of this medication.  Please schedule a 30 minute follow up.  Thanks!!

## 2017-05-26 ENCOUNTER — Other Ambulatory Visit: Payer: Self-pay | Admitting: Adult Health

## 2017-05-26 DIAGNOSIS — F0280 Dementia in other diseases classified elsewhere without behavioral disturbance: Secondary | ICD-10-CM

## 2017-05-26 DIAGNOSIS — I10 Essential (primary) hypertension: Secondary | ICD-10-CM | POA: Diagnosis not present

## 2017-05-26 DIAGNOSIS — E7800 Pure hypercholesterolemia, unspecified: Secondary | ICD-10-CM | POA: Diagnosis not present

## 2017-05-26 DIAGNOSIS — G301 Alzheimer's disease with late onset: Principal | ICD-10-CM

## 2017-05-26 DIAGNOSIS — Z5181 Encounter for therapeutic drug level monitoring: Secondary | ICD-10-CM | POA: Diagnosis not present

## 2017-05-26 LAB — COMPREHENSIVE METABOLIC PANEL
A/G RATIO: 1.6 (ref 1.2–2.2)
ALT: 10 IU/L (ref 0–32)
AST: 12 IU/L (ref 0–40)
Albumin: 4 g/dL (ref 3.5–4.7)
Alkaline Phosphatase: 75 IU/L (ref 39–117)
BILIRUBIN TOTAL: 0.7 mg/dL (ref 0.0–1.2)
BUN/Creatinine Ratio: 17 (ref 12–28)
BUN: 20 mg/dL (ref 8–27)
CHLORIDE: 105 mmol/L (ref 96–106)
CO2: 22 mmol/L (ref 20–29)
Calcium: 9.6 mg/dL (ref 8.7–10.3)
Creatinine, Ser: 1.19 mg/dL — ABNORMAL HIGH (ref 0.57–1.00)
GFR calc non Af Amer: 41 mL/min/{1.73_m2} — ABNORMAL LOW (ref >59)
GFR, EST AFRICAN AMERICAN: 48 mL/min/{1.73_m2} — AB (ref >59)
Globulin, Total: 2.5 g/dL (ref 1.5–4.5)
Glucose: 127 mg/dL — ABNORMAL HIGH (ref 65–99)
POTASSIUM: 3.9 mmol/L (ref 3.5–5.2)
SODIUM: 142 mmol/L (ref 134–144)
Total Protein: 6.5 g/dL (ref 6.0–8.5)

## 2017-05-26 LAB — LIPID PANEL
CHOL/HDL RATIO: 3.1 ratio (ref 0.0–4.4)
Cholesterol, Total: 134 mg/dL (ref 100–199)
HDL: 43 mg/dL (ref >39)
LDL Calculated: 72 mg/dL (ref 0–99)
TRIGLYCERIDES: 96 mg/dL (ref 0–149)
VLDL Cholesterol Cal: 19 mg/dL (ref 5–40)

## 2017-05-26 MED ORDER — MEMANTINE HCL 5 MG PO TABS: 5.0000 mg | ORAL_TABLET | Freq: Every day | ORAL | 3 refills | Status: DC

## 2017-06-02 DIAGNOSIS — N3020 Other chronic cystitis without hematuria: Secondary | ICD-10-CM | POA: Diagnosis not present

## 2017-06-05 ENCOUNTER — Ambulatory Visit: Payer: Medicare HMO | Admitting: Cardiovascular Disease

## 2017-06-07 ENCOUNTER — Telehealth (INDEPENDENT_AMBULATORY_CARE_PROVIDER_SITE_OTHER): Payer: Self-pay | Admitting: Physical Medicine and Rehabilitation

## 2017-06-07 ENCOUNTER — Telehealth: Payer: Self-pay | Admitting: Cardiology

## 2017-06-07 NOTE — Telephone Encounter (Signed)
I am not sure we can do monthly coccyx injections, we could talk about doing ganglion of impar block (like sympathetic block) or maybe try injection with torodol instead of cortisone. I would repeat once again and we can talk, need 30 minutes

## 2017-06-07 NOTE — Telephone Encounter (Signed)
Pt's daughter called. Her mother didn't "look right" and they noted her HR was > 100. The pt's beta blocker had been cut back some months ago secondary to bradycardia (pt has CAF). The pt's daughter wanted to know if the pt could have her beta blocker early and told them that was fine, and in fact she could have an extra Metoprolol 12.5 mg daily PRN if needed for sustained HR > 100.   Kerin Ransom PA-C 06/07/2017 8:01 PM

## 2017-06-07 NOTE — Telephone Encounter (Signed)
Received page from patient's daughter.  Please see last telephone note from earlier this evening.  She gave the patient her dose of metoprolol 12.5 mg early, her HR has responded well, predominantly in the 70s-90s, occasionally as high as 105 and occasionally as low as 50.  I advised to administer the extra dose of metoprolol 12.5 mg if her rates were sustained above 110-120.  Her BP has been robust at 140s/80s.  The patient has not had any CP, SOB, lightheadedness thus far.  I advised her to bring the pt in for ED evaluation if she were to have any of these symptoms.  Pt's daughter was in agreement with the plan.  Doylene Canning, MD

## 2017-06-08 ENCOUNTER — Telehealth: Payer: Self-pay | Admitting: Cardiovascular Disease

## 2017-06-08 NOTE — Telephone Encounter (Signed)
Spoke with daughter and patient doing better today but still seems like she is not feeling well. Color and heart rate ok. Daughter thinks that patient has UTI and is waiting on C&S results from urinalysis done 06/02/17 e Explained to daughter potassium level normal earlier this month.  Advised to follow up with ordering MD on the possible UTI  Also had discussed decreasing the Pravachol as noted in last office visit with Dr Oval Linsey. Daughter would like to decrease to 20 mg daily, advised ok per Dr Oval Linsey

## 2017-06-08 NOTE — Telephone Encounter (Signed)
Scheduled for 06/19/17 at 0915.

## 2017-06-08 NOTE — Telephone Encounter (Signed)
[name] (daughter) is calling because Mrs. Melland had some Afib on last night and is asking if she can come in to have her potassium check  To make sure her levels are good . Please call

## 2017-06-19 ENCOUNTER — Ambulatory Visit (INDEPENDENT_AMBULATORY_CARE_PROVIDER_SITE_OTHER): Payer: Medicare HMO

## 2017-06-19 ENCOUNTER — Encounter (INDEPENDENT_AMBULATORY_CARE_PROVIDER_SITE_OTHER): Payer: Self-pay | Admitting: Physical Medicine and Rehabilitation

## 2017-06-19 ENCOUNTER — Ambulatory Visit (INDEPENDENT_AMBULATORY_CARE_PROVIDER_SITE_OTHER): Payer: Medicare HMO | Admitting: Physical Medicine and Rehabilitation

## 2017-06-19 DIAGNOSIS — E118 Type 2 diabetes mellitus with unspecified complications: Secondary | ICD-10-CM

## 2017-06-19 DIAGNOSIS — G309 Alzheimer's disease, unspecified: Secondary | ICD-10-CM

## 2017-06-19 DIAGNOSIS — G894 Chronic pain syndrome: Secondary | ICD-10-CM

## 2017-06-19 DIAGNOSIS — F0281 Dementia in other diseases classified elsewhere with behavioral disturbance: Secondary | ICD-10-CM

## 2017-06-19 DIAGNOSIS — R102 Pelvic and perineal pain: Secondary | ICD-10-CM

## 2017-06-19 DIAGNOSIS — M533 Sacrococcygeal disorders, not elsewhere classified: Secondary | ICD-10-CM | POA: Diagnosis not present

## 2017-06-19 MED ORDER — METHYLPREDNISOLONE ACETATE 80 MG/ML IJ SUSP: 80.0000 mg | Freq: Once | INTRAMUSCULAR | Status: DC

## 2017-06-19 MED ORDER — LIDOCAINE HCL (PF) 1 % IJ SOLN: 2.0000 mL | Freq: Once | INTRAMUSCULAR | Status: DC

## 2017-06-19 NOTE — Progress Notes (Signed)
Anna [name] - 83 y.o. female MRN [id number]  Date of birth: 02/05/1929  Office Visit Note: Visit Date: 06/19/2017 PCP: Dorothyann Peng, NP Referred by: Dorothyann Peng, NP  Subjective: Chief Complaint  Patient presents with  . Lower Back - Pain   HPI: Anna [name] is an 83 year old female present with her daughter who is the primary caregiver and power of attorney.  The patient has advanced Alzheimer's dementia as well as congestive heart failure and diabetes and multiple medical complaints and conditions.  We have been seeing her on 2 occasions for coccydynia.  She was having issue was sitting for prolonged periods and having a lot of pain right over the coccyx.  After the first injection she had substantial relief and was more comfortable according to the daughter.  She did come back for a second injection because the symptoms were returning but not as bad.  She is still overall not nearly as uncomfortable sitting is when we saw her the first time.  But she does get periods where she gets agitated and does have to move and readjust her position.  She obviously still is sensate and can adjust her pressure pretty effectively.  She does have an area around the coccyx that we have seen on exam that is around the skin which is thinned around this area that is no muscle tissue and no padding.  They do have her on a coccydynia type pad sitting in the wheelchair.  They do this at home as well.  She gets agitated does move quite a bit.  On exam there has been no sacral ulcers and the skin looks intact.  There is no significant redness or skin blanching.  The patient's daughter does not indicate there is any other pain complaints in general.    Review of Systems  Constitutional: Negative.   HENT: Negative for congestion and nosebleeds.   Eyes: Negative for pain and redness.  Respiratory: Negative for cough.   Skin: Negative for itching and rash.  Neurological: Negative for tingling and tremors.    Psychiatric/Behavioral: Positive for memory loss.  All other systems reviewed and are negative.  Otherwise per HPI.  Assessment & Plan: Visit Diagnoses:  1. Coccyxdynia   2. Pelvic pain in female   3. Chronic pain syndrome   4. Diabetes mellitus with complication ([place])   5. Alzheimer's dementia with behavioral disturbance, unspecified timing of dementia onset     Plan: Findings:  Chronic coccydynia significantly improved after first and second injection but still problematic to a degree he is still sensate and will provide herself with pressure relief changes of her seated position.  I think at this point repeating the injection one time because the daughter feels that it made her so much more comfortable is fine.  We talked about numbers of injections and length of time to sort a half to see how she does with that.  I think the best answer will be to try to prevent her from having pain and irritation around this area.  I would suggest that she see the folks at [facility] prescription that we will at least let her talk to them and see what they can do in terms of seating and padding.  We also talked about using Lidoderm or over-the-counter lidocaine patches in this area.  She also may want to look at something like a Band-Aid to like DuoDERM short-term while she sitting during the day.  We will go ahead and complete the injection  today the daughter is going to talk to the prosthetics and orthotics people just to see if there is anything they can do from a seating standpoint or at least figure out another avenue or person that could talk to them about seating.  Likely the patient can still provide pressure relief with body position changes.    Meds & Orders:  Meds ordered this encounter  Medications  . lidocaine (PF) (XYLOCAINE) 1 % injection 2 mL  . methylPREDNISolone acetate (DEPO-MEDROL) injection 80 mg    Orders Placed This Encounter  Procedures  . Nerve Block  . XR C-ARM NO REPORT  .  Ambulatory referral for Orthotics    Follow-up: Return if symptoms worsen or fail to improve.   Procedures: No procedures performed   Coccyx (C1-2) Injection with Fluoroscopic Guidance  Patient: Anna [name]      Date of Birth: Oct 27, 1928 MRN: [id number] PCP: Dorothyann Peng, NP      Visit Date: 06/19/2017   Universal Protocol:    Date/Time: 10/29/189:35 AM  Consent Given By: the patient  Position: PRONE  Additional Comments: Vital signs were monitored before and after the procedure. Patient was prepped and draped in the usual sterile fashion. The correct patient, procedure, and site was verified.   Injection Procedure Details:  Procedure Site One Meds Administered:  Meds ordered this encounter  Medications  . lidocaine (PF) (XYLOCAINE) 1 % injection 2 mL  . methylPREDNISolone acetate (DEPO-MEDROL) injection 80 mg    Laterality: Midline  Location/Site:  Coccyx  Needle size: 22 G  Needle type: spinal needle  Needle Placement: C1-2 junction  Findings:  -Contrast Used: 0.5 mL iohexol 180 mg iodine/mL   -Comments: Excellent flow of contrast producing a partial arthrogram.  Procedure Details: Using a posterior approach with the fluoroscope beam in a perpendicular to the table AP position, the region overlying the coccyx was localized under fluoroscopic visualization. The soft tissues overlying this structure were infiltrated with 1 ml. of 1% Lidocaine without Epinephrine. A #22 gauge, spinal needle was advanced until contact with periosteum using fluoroscopic guidance. The needle was "walked off" into the C1-2 vestigial disc if visualized.      Position in the disc and on the coccyx was confirmed by bi-planar imaging and using a 1 ml. volume of Isovue-250 contrast agent. After negative aspirate for gross pus, stool or blood, the injectate was delivered. Radiographs were obtained for documentation purposes.    Additional Comments:  The patient tolerated the  procedure well Dressing: Band-Aid    Post-procedure details: Patient was observed during the procedure. Post-procedure instructions were reviewed.  Patient left the clinic in stable condition.      Clinical History: No specialty comments available.  She reports that she has never smoked. She has never used smokeless tobacco.   Recent Labs  03/22/17 1141  HGBA1C 5.6    Objective:  VS:  HT:    WT:   BMI:     BP:   HR: bpm  TEMP: ( )  RESP:  Physical Exam  Constitutional: She appears well-developed and well-nourished.  HENT:  Head: Normocephalic and atraumatic.  Nose: Nose normal.  Eyes: Conjunctivae are normal.  Neck: Neck supple. No tracheal deviation present.  Pulmonary/Chest: Effort normal.  Abdominal: Soft.  Musculoskeletal:  Patient has tenderness over the coccyx pretty acutely.  There is no swelling or induration or redness or erythema or broken skin or ulceration or blanching around the coccyx or sacral area.  She does not have  much in the way of padding however around this area.  She has good distal leg strength.  She is able to stand on her own.  Neurological: No sensory deficit. She exhibits normal muscle tone.    Ortho Exam Imaging: Xr C-arm No Report  Result Date: 06/19/2017 Please see Notes or Procedures tab for imaging impression.   Past Medical/Family/Surgical/Social History: Medications & Allergies reviewed per EMR Patient Active Problem List   Diagnosis Date Noted  . Dysphagia   . Abnormal urine sediment 02/11/2017  . Upper airway cough syndrome 01/06/2017  . Acute on chronic diastolic heart failure ([place])   . Atrial fibrillation ([place]) 09/15/2016  . Atrial fibrillation with RVR ([place]) 09/15/2016  . Near syncope 02/05/2016  . Pre-syncope 02/05/2016  . Chest pain   . Diabetes mellitus with complication ([place])   . Hypokalemia 08/28/2015  . UTI (lower urinary tract infection) 08/26/2015  . Generalized weakness 08/26/2015  . Acute combined  systolic and diastolic heart failure ([place]) 02/16/2015  . Hyperthyroidism 01/28/2015  . Dyspnea 01/27/2015  . SOB (shortness of breath) 01/27/2015  . Failure to thrive in adult 01/27/2015  . Benign essential HTN 01/27/2015  . DM (diabetes mellitus) type II controlled with renal manifestation ([place]) 01/27/2015  . CKD (chronic kidney disease), stage III ([place]) 01/27/2015   Past Medical History:  Diagnosis Date  . Alzheimer's disease   . Atrial fibrillation ([place])   . Bronchitis   . CHF (congestive heart failure) ([place])   . Chronic renal insufficiency, stage III (moderate) ([facility])   . Diabetes mellitus without complication ([place])   . Dyspnea    with exertion  . GERD (gastroesophageal reflux disease)   . GI bleeding 11/2013   due to supratherapeutic INR  . Hyperlipidemia   . Hypertension   . Hyperthyroidism    on amiodarone  . LBBB (left bundle branch block)   . LBBB (left bundle branch block)   . Overactive bladder   . Overweight   . Persistent atrial fibrillation ([place])   . Pneumonia   . Thyroid mass    LLL  . UTI (urinary tract infection)   . Vitamin B12 deficiency   . Vitamin D deficiency    Family History  Problem Relation Age of Onset  . Heart disease Father   . Diabetes Mother   . Thyroid disease Neg Hx    Past Surgical History:  Procedure Laterality Date  . ABDOMINAL HYSTERECTOMY    . ESOPHAGOGASTRODUODENOSCOPY (EGD) WITH PROPOFOL N/A 02/16/2017   Procedure: ESOPHAGOGASTRODUODENOSCOPY (EGD) WITH PROPOFOL;  Surgeon: Irene Shipper, MD;  Location: [facility] ENDOSCOPY;  Service: Endoscopy;  Laterality: N/A;   Social History   Occupational History  . Not on file.   Social History Main Topics  . Smoking status: Never Smoker  . Smokeless tobacco: Never Used  . Alcohol use No  . Drug use: No  . Sexual activity: Not on file

## 2017-06-19 NOTE — Progress Notes (Signed)
Last injection seemed to do well. Still having some relief per daughter, but she is twitching and seems uncomfortable. Still doesn't seem to be as bad as she was before first injection.

## 2017-06-19 NOTE — Patient Instructions (Signed)

## 2017-06-19 NOTE — Procedures (Signed)
  Coccyx (C1-2) Injection with Fluoroscopic Guidance  Patient: Anna [name]      Date of Birth: 10-19-1928 MRN: [id number] PCP: Dorothyann Peng, NP      Visit Date: 06/19/2017   Universal Protocol:    Date/Time: 10/29/189:35 AM  Consent Given By: the patient  Position: PRONE  Additional Comments: Vital signs were monitored before and after the procedure. Patient was prepped and draped in the usual sterile fashion. The correct patient, procedure, and site was verified.   Injection Procedure Details:  Procedure Site One Meds Administered:  Meds ordered this encounter  Medications  . lidocaine (PF) (XYLOCAINE) 1 % injection 2 mL  . methylPREDNISolone acetate (DEPO-MEDROL) injection 80 mg    Laterality: Midline  Location/Site:  Coccyx  Needle size: 22 G  Needle type: spinal needle  Needle Placement: C1-2 junction  Findings:  -Contrast Used: 0.5 mL iohexol 180 mg iodine/mL   -Comments: Excellent flow of contrast producing a partial arthrogram.  Procedure Details: Using a posterior approach with the fluoroscope beam in a perpendicular to the table AP position, the region overlying the coccyx was localized under fluoroscopic visualization. The soft tissues overlying this structure were infiltrated with 1 ml. of 1% Lidocaine without Epinephrine. A #22 gauge, spinal needle was advanced until contact with periosteum using fluoroscopic guidance. The needle was "walked off" into the C1-2 vestigial disc if visualized.      Position in the disc and on the coccyx was confirmed by bi-planar imaging and using a 1 ml. volume of Isovue-250 contrast agent. After negative aspirate for gross pus, stool or blood, the injectate was delivered. Radiographs were obtained for documentation purposes.    Additional Comments:  The patient tolerated the procedure well Dressing: Band-Aid    Post-procedure details: Patient was observed during the procedure. Post-procedure instructions were  reviewed.  Patient left the clinic in stable condition.

## 2017-06-22 ENCOUNTER — Encounter: Payer: Self-pay | Admitting: Adult Health

## 2017-06-22 ENCOUNTER — Ambulatory Visit (INDEPENDENT_AMBULATORY_CARE_PROVIDER_SITE_OTHER): Payer: Medicare HMO | Admitting: Adult Health

## 2017-06-22 VITALS — BP 124/74 | HR 80 | Temp 97.4°F

## 2017-06-22 DIAGNOSIS — E559 Vitamin D deficiency, unspecified: Secondary | ICD-10-CM | POA: Diagnosis not present

## 2017-06-22 DIAGNOSIS — N183 Chronic kidney disease, stage 3 unspecified: Secondary | ICD-10-CM

## 2017-06-22 DIAGNOSIS — D631 Anemia in chronic kidney disease: Secondary | ICD-10-CM

## 2017-06-22 DIAGNOSIS — Z23 Encounter for immunization: Secondary | ICD-10-CM

## 2017-06-22 DIAGNOSIS — E118 Type 2 diabetes mellitus with unspecified complications: Secondary | ICD-10-CM

## 2017-06-22 LAB — CBC WITH DIFFERENTIAL/PLATELET
BASOS PCT: 0.6 % (ref 0.0–3.0)
Basophils Absolute: 0 10*3/uL (ref 0.0–0.1)
EOS PCT: 0.9 % (ref 0.0–5.0)
Eosinophils Absolute: 0.1 10*3/uL (ref 0.0–0.7)
HCT: 47.1 % — ABNORMAL HIGH (ref 36.0–46.0)
HEMOGLOBIN: 15.5 g/dL — AB (ref 12.0–15.0)
LYMPHS ABS: 1.6 10*3/uL (ref 0.7–4.0)
Lymphocytes Relative: 22 % (ref 12.0–46.0)
MCHC: 32.9 g/dL (ref 30.0–36.0)
MCV: 94.1 fl (ref 78.0–100.0)
MONO ABS: 0.4 10*3/uL (ref 0.1–1.0)
Monocytes Relative: 5.5 % (ref 3.0–12.0)
Neutro Abs: 5.1 10*3/uL (ref 1.4–7.7)
Neutrophils Relative %: 71 % (ref 43.0–77.0)
Platelets: 202 10*3/uL (ref 150.0–400.0)
RBC: 5 Mil/uL (ref 3.87–5.11)
RDW: 15.8 % — AB (ref 11.5–15.5)
WBC: 7.1 10*3/uL (ref 4.0–10.5)

## 2017-06-22 LAB — BASIC METABOLIC PANEL
BUN: 24 mg/dL — ABNORMAL HIGH (ref 6–23)
CO2: 28 meq/L (ref 19–32)
CREATININE: 1.22 mg/dL — AB (ref 0.40–1.20)
Calcium: 9.7 mg/dL (ref 8.4–10.5)
Chloride: 105 mEq/L (ref 96–112)
GFR: 44.35 mL/min — ABNORMAL LOW (ref >60.00)
Glucose, Bld: 226 mg/dL — ABNORMAL HIGH (ref 70–99)
Potassium: 4.1 mEq/L (ref 3.5–5.1)
Sodium: 140 mEq/L (ref 135–145)

## 2017-06-22 LAB — HEMOGLOBIN A1C: HEMOGLOBIN A1C: 6.1 % (ref 4.6–6.5)

## 2017-06-22 LAB — VITAMIN D 25 HYDROXY (VIT D DEFICIENCY, FRACTURES): VITD: 70.13 ng/mL (ref 30.00–100.00)

## 2017-06-22 NOTE — Addendum Note (Signed)
Addended by: Elmer Picker on: 06/22/2017 11:18 AM   Modules accepted: Orders

## 2017-06-22 NOTE — Progress Notes (Signed)
Subjective:    Patient ID: Anna [name], female    DOB: [birth year], 83 y.o.   MRN: [id number]  HPI 83 year old female who  has a past medical history of Alzheimer's disease; Atrial fibrillation ([place]); Bronchitis; CHF (congestive heart failure) ([place]); Chronic renal insufficiency, stage III (moderate) ([place]); Diabetes mellitus without complication ([place]); Dyspnea; GERD (gastroesophageal reflux disease); GI bleeding (11/2013); Hyperlipidemia; Hypertension; Hyperthyroidism; LBBB (left bundle branch block); LBBB (left bundle branch block); Overactive bladder; Overweight; Persistent atrial fibrillation ([place]); Pneumonia; Thyroid mass; UTI (urinary tract infection); Vitamin B12 deficiency; and Vitamin D deficiency.  She presents to the office today for follow up regarding vitamin D level. We last tested this level in 03/2017 which showed a result of 105. She was taking 50,000 units weekly. She was 1000 units daily.   Her daughter is with her at this visit and would like other labs drawn today, these include iron panel due to history of anemia, A1c, and to check her potassium level.   Daughter reports that her mother has been doing fairly well. She continues to laugh and can call out family members by their names, her appetite has stayed good    Review of Systems  Unable to perform ROS: Dementia   Past Medical History:  Diagnosis Date  . Alzheimer's disease   . Atrial fibrillation ([place])   . Bronchitis   . CHF (congestive heart failure) ([place])   . Chronic renal insufficiency, stage III (moderate) (H[facility])   . Diabetes mellitus without complication ([place])   . Dyspnea    with exertion  . GERD (gastroesophageal reflux disease)   . GI bleeding 11/2013   due to supratherapeutic INR  . Hyperlipidemia   . Hypertension   . Hyperthyroidism    on amiodarone  . LBBB (left bundle branch block)   . LBBB (left bundle branch block)   . Overactive bladder   . Overweight   . Persistent atrial fibrillation  ([place])   . Pneumonia   . Thyroid mass    LLL  . UTI (urinary tract infection)   . Vitamin B12 deficiency   . Vitamin D deficiency     Social History   Social History  . Marital status: Divorced    Spouse name: N/A  . Number of children: N/A  . Years of education: N/A   Occupational History  . Not on file.   Social History Main Topics  . Smoking status: Never Smoker  . Smokeless tobacco: Never Used  . Alcohol use No  . Drug use: No  . Sexual activity: Not on file   Other Topics Concern  . Not on file   Social History Narrative   Pt recently moved to [place] with daughter from [place]       Past Surgical History:  Procedure Laterality Date  . ABDOMINAL HYSTERECTOMY    . ESOPHAGOGASTRODUODENOSCOPY (EGD) WITH PROPOFOL N/A 02/16/2017   Procedure: ESOPHAGOGASTRODUODENOSCOPY (EGD) WITH PROPOFOL;  Surgeon: Irene Shipper, MD;  Location: [facility] ENDOSCOPY;  Service: Endoscopy;  Laterality: N/A;    Family History  Problem Relation Age of Onset  . Heart disease Father   . Diabetes Mother   . Thyroid disease Neg Hx     Allergies  Allergen Reactions  . Pollen Extract     Sneezing, watery eyes    Current Outpatient Prescriptions on File Prior to Visit  Medication Sig Dispense Refill  . ACCU-CHEK AVIVA PLUS test strip PATIENT TO CHECK SUGARS ONCE DAILY.  3  .  acetaminophen (TYLENOL) 500 MG tablet Take 500 mg by mouth every 6 (six) hours as needed for fever.     Marland Kitchen aspirin 325 MG tablet Take 325 mg by mouth daily.    . calcitRIOL (ROCALTROL) 0.5 MCG capsule Take 1 capsule (0.5 mcg total) by mouth 3 (three) times a week. Monday, Wednesday, and Friday only 36 capsule 3  . carbamide peroxide (DEBROX) 6.5 % OTIC solution Place 5 drops into both ears 2 (two) times daily. 15 mL 1  . docusate sodium (COLACE) 100 MG capsule Take 100 mg by mouth daily as needed for mild constipation.    Marland Kitchen donepezil (ARICEPT) 10 MG tablet TAKE 1 TABLET ONCE DAILY. 90 tablet 3  . fluticasone (FLONASE)  50 MCG/ACT nasal spray Place 2 sprays into both nostrils daily.    . furosemide (LASIX) 20 MG tablet Take 1.5 tablets (30 mg total) by mouth daily. Take an extra 0.5 tablet daily as needed. 60 tablet 5  . glimepiride (AMARYL) 2 MG tablet TAKE 1 TABLET ONCE DAILY. 90 tablet 3  . guaiFENesin (MUCINEX) 600 MG 12 hr tablet Take 600 mg by mouth 2 (two) times daily as needed for cough. Reported on 12/22/2015    . Iron-FA-B Cmp-C-Biot-Probiotic (FUSION PLUS) CAPS Take 1 tablet by mouth daily. 90 capsule 3  . loratadine (ALAVERT) 10 MG tablet Take 5 mg by mouth daily. Every 2 days     . memantine (NAMENDA) 5 MG tablet Take 1 tablet (5 mg total) by mouth at bedtime. 30 tablet 3  . metoprolol tartrate (LOPRESSOR) 25 MG tablet Take 25 mg by mouth as directed. Take 1/2 tablet by mouth twice a day    . mirabegron ER (MYRBETRIQ) 25 MG TB24 tablet Take 25 mg by mouth daily.    Marland Kitchen omeprazole (PRILOSEC) 40 MG capsule Take 30- 60 min before your first and last meals of the day 60 capsule 11  . Polyethyl Glycol-Propyl Glycol (SYSTANE) 0.4-0.3 % SOLN Apply 1 drop to eye 2 (two) times daily as needed (dry eyes).    . potassium chloride SA (K-DUR,KLOR-CON) 20 MEQ tablet Take 20 mEq by mouth daily.    . vitamin B-12 (CYANOCOBALAMIN) 500 MCG tablet Take 500 mcg by mouth daily.     Current Facility-Administered Medications on File Prior to Visit  Medication Dose Route Frequency Provider Last Rate Last Dose  . lidocaine (PF) (XYLOCAINE) 1 % injection 2 mL  2 mL Other Once Magnus Sinning, MD      . methylPREDNISolone acetate (DEPO-MEDROL) injection 80 mg  80 mg Other Once Magnus Sinning, MD        BP 124/74 (BP Location: Right Arm)   Pulse 80   Temp (!) 97.4 F (36.3 C) (Temporal)   SpO2 98%       Objective:   Physical Exam  Constitutional: She appears well-developed and well-nourished. No distress.  Cardiovascular: Normal rate, normal heart sounds and intact distal pulses.  An irregularly irregular rhythm  present. Exam reveals no gallop and no friction rub.   No murmur heard. Pulmonary/Chest: Effort normal and breath sounds normal. No respiratory distress. She has no wheezes. She has no rales. She exhibits no tenderness.  Skin: Skin is warm and dry. No rash noted. She is not diaphoretic. No erythema. No pallor.  Psychiatric: She is slowed. Cognition and memory are impaired. She is noncommunicative.  Nursing note and vitals reviewed.     Assessment & Plan:  I will order CBC, A1c, iron panel, and vitamin  D level. Advised her daughter that these labs would not necessarily change course of action and she understands that. We reviewed medication and daughter was advised that due to advancing dementia, she does not really need to take statin at this time. Ideally, she probably doesn't need to take Amaryl either. Will update daughter on labs once they are back   Dorothyann Peng, NP

## 2017-07-09 ENCOUNTER — Encounter (HOSPITAL_COMMUNITY): Payer: Self-pay | Admitting: Emergency Medicine

## 2017-07-09 ENCOUNTER — Inpatient Hospital Stay (HOSPITAL_COMMUNITY)
Admission: EM | Admit: 2017-07-09 | Discharge: 2017-07-17 | DRG: 981 | Disposition: A | Discharge status: Home Health | Payer: Medicare HMO | Attending: Internal Medicine | Admitting: Internal Medicine

## 2017-07-09 ENCOUNTER — Emergency Department (HOSPITAL_COMMUNITY): Payer: Medicare HMO

## 2017-07-09 DIAGNOSIS — I10 Essential (primary) hypertension: Secondary | ICD-10-CM | POA: Diagnosis present

## 2017-07-09 DIAGNOSIS — R4182 Altered mental status, unspecified: Secondary | ICD-10-CM | POA: Diagnosis not present

## 2017-07-09 DIAGNOSIS — L0231 Cutaneous abscess of buttock: Secondary | ICD-10-CM | POA: Diagnosis not present

## 2017-07-09 DIAGNOSIS — G9341 Metabolic encephalopathy: Secondary | ICD-10-CM | POA: Diagnosis present

## 2017-07-09 DIAGNOSIS — G309 Alzheimer's disease, unspecified: Secondary | ICD-10-CM | POA: Diagnosis present

## 2017-07-09 DIAGNOSIS — F0280 Dementia in other diseases classified elsewhere without behavioral disturbance: Secondary | ICD-10-CM | POA: Diagnosis not present

## 2017-07-09 DIAGNOSIS — Z7951 Long term (current) use of inhaled steroids: Secondary | ICD-10-CM | POA: Diagnosis not present

## 2017-07-09 DIAGNOSIS — N3281 Overactive bladder: Secondary | ICD-10-CM | POA: Diagnosis present

## 2017-07-09 DIAGNOSIS — L0291 Cutaneous abscess, unspecified: Secondary | ICD-10-CM | POA: Diagnosis not present

## 2017-07-09 DIAGNOSIS — N390 Urinary tract infection, site not specified: Secondary | ICD-10-CM | POA: Diagnosis present

## 2017-07-09 DIAGNOSIS — I481 Persistent atrial fibrillation: Secondary | ICD-10-CM | POA: Diagnosis present

## 2017-07-09 DIAGNOSIS — L0502 Pilonidal sinus with abscess: Secondary | ICD-10-CM | POA: Diagnosis not present

## 2017-07-09 DIAGNOSIS — M869 Osteomyelitis, unspecified: Secondary | ICD-10-CM

## 2017-07-09 DIAGNOSIS — Z90710 Acquired absence of both cervix and uterus: Secondary | ICD-10-CM | POA: Diagnosis not present

## 2017-07-09 DIAGNOSIS — L89154 Pressure ulcer of sacral region, stage 4: Secondary | ICD-10-CM | POA: Diagnosis present

## 2017-07-09 DIAGNOSIS — Z7984 Long term (current) use of oral hypoglycemic drugs: Secondary | ICD-10-CM | POA: Diagnosis not present

## 2017-07-09 DIAGNOSIS — R059 Cough, unspecified: Secondary | ICD-10-CM

## 2017-07-09 DIAGNOSIS — I509 Heart failure, unspecified: Secondary | ICD-10-CM | POA: Diagnosis not present

## 2017-07-09 DIAGNOSIS — Z833 Family history of diabetes mellitus: Secondary | ICD-10-CM | POA: Diagnosis not present

## 2017-07-09 DIAGNOSIS — Z79899 Other long term (current) drug therapy: Secondary | ICD-10-CM

## 2017-07-09 DIAGNOSIS — K219 Gastro-esophageal reflux disease without esophagitis: Secondary | ICD-10-CM | POA: Diagnosis present

## 2017-07-09 DIAGNOSIS — L0501 Pilonidal cyst with abscess: Secondary | ICD-10-CM | POA: Diagnosis present

## 2017-07-09 DIAGNOSIS — R05 Cough: Secondary | ICD-10-CM

## 2017-07-09 DIAGNOSIS — E785 Hyperlipidemia, unspecified: Secondary | ICD-10-CM | POA: Diagnosis present

## 2017-07-09 DIAGNOSIS — D696 Thrombocytopenia, unspecified: Secondary | ICD-10-CM | POA: Diagnosis present

## 2017-07-09 DIAGNOSIS — I482 Chronic atrial fibrillation: Secondary | ICD-10-CM | POA: Diagnosis present

## 2017-07-09 DIAGNOSIS — R402441 Other coma, without documented Glasgow coma scale score, or with partial score reported, in the field [EMT or ambulance]: Secondary | ICD-10-CM | POA: Diagnosis not present

## 2017-07-09 DIAGNOSIS — I5022 Chronic systolic (congestive) heart failure: Secondary | ICD-10-CM | POA: Diagnosis present

## 2017-07-09 DIAGNOSIS — E1121 Type 2 diabetes mellitus with diabetic nephropathy: Secondary | ICD-10-CM | POA: Diagnosis not present

## 2017-07-09 DIAGNOSIS — R0602 Shortness of breath: Secondary | ICD-10-CM | POA: Diagnosis not present

## 2017-07-09 DIAGNOSIS — S31809A Unspecified open wound of unspecified buttock, initial encounter: Secondary | ICD-10-CM | POA: Diagnosis not present

## 2017-07-09 DIAGNOSIS — I110 Hypertensive heart disease with heart failure: Secondary | ICD-10-CM | POA: Diagnosis not present

## 2017-07-09 DIAGNOSIS — N183 Chronic kidney disease, stage 3 (moderate): Secondary | ICD-10-CM | POA: Diagnosis present

## 2017-07-09 DIAGNOSIS — M533 Sacrococcygeal disorders, not elsewhere classified: Secondary | ICD-10-CM

## 2017-07-09 DIAGNOSIS — E1169 Type 2 diabetes mellitus with other specified complication: Secondary | ICD-10-CM | POA: Diagnosis present

## 2017-07-09 DIAGNOSIS — Z8249 Family history of ischemic heart disease and other diseases of the circulatory system: Secondary | ICD-10-CM | POA: Diagnosis not present

## 2017-07-09 DIAGNOSIS — E1129 Type 2 diabetes mellitus with other diabetic kidney complication: Secondary | ICD-10-CM | POA: Diagnosis present

## 2017-07-09 DIAGNOSIS — M4628 Osteomyelitis of vertebra, sacral and sacrococcygeal region: Secondary | ICD-10-CM | POA: Diagnosis present

## 2017-07-09 DIAGNOSIS — I130 Hypertensive heart and chronic kidney disease with heart failure and stage 1 through stage 4 chronic kidney disease, or unspecified chronic kidney disease: Secondary | ICD-10-CM | POA: Diagnosis present

## 2017-07-09 DIAGNOSIS — Z7982 Long term (current) use of aspirin: Secondary | ICD-10-CM | POA: Diagnosis not present

## 2017-07-09 DIAGNOSIS — R109 Unspecified abdominal pain: Secondary | ICD-10-CM | POA: Diagnosis not present

## 2017-07-09 DIAGNOSIS — E1122 Type 2 diabetes mellitus with diabetic chronic kidney disease: Secondary | ICD-10-CM | POA: Diagnosis present

## 2017-07-09 DIAGNOSIS — B952 Enterococcus as the cause of diseases classified elsewhere: Secondary | ICD-10-CM | POA: Diagnosis present

## 2017-07-09 DIAGNOSIS — F0281 Dementia in other diseases classified elsewhere with behavioral disturbance: Secondary | ICD-10-CM | POA: Diagnosis present

## 2017-07-09 DIAGNOSIS — R509 Fever, unspecified: Secondary | ICD-10-CM | POA: Diagnosis not present

## 2017-07-09 DIAGNOSIS — Z09 Encounter for follow-up examination after completed treatment for conditions other than malignant neoplasm: Secondary | ICD-10-CM

## 2017-07-09 DIAGNOSIS — I5041 Acute combined systolic (congestive) and diastolic (congestive) heart failure: Secondary | ICD-10-CM | POA: Diagnosis not present

## 2017-07-09 DIAGNOSIS — B961 Klebsiella pneumoniae [K. pneumoniae] as the cause of diseases classified elsewhere: Secondary | ICD-10-CM | POA: Diagnosis present

## 2017-07-09 DIAGNOSIS — R9431 Abnormal electrocardiogram [ECG] [EKG]: Secondary | ICD-10-CM | POA: Diagnosis not present

## 2017-07-09 DIAGNOSIS — L02212 Cutaneous abscess of back [any part, except buttock]: Secondary | ICD-10-CM | POA: Diagnosis present

## 2017-07-09 DIAGNOSIS — F02818 Dementia in other diseases classified elsewhere, unspecified severity, with other behavioral disturbance: Secondary | ICD-10-CM | POA: Diagnosis present

## 2017-07-09 DIAGNOSIS — I5033 Acute on chronic diastolic (congestive) heart failure: Secondary | ICD-10-CM | POA: Diagnosis not present

## 2017-07-09 DIAGNOSIS — R531 Weakness: Secondary | ICD-10-CM | POA: Diagnosis not present

## 2017-07-09 DIAGNOSIS — A1801 Tuberculosis of spine: Secondary | ICD-10-CM | POA: Diagnosis not present

## 2017-07-09 DIAGNOSIS — I6789 Other cerebrovascular disease: Secondary | ICD-10-CM | POA: Diagnosis not present

## 2017-07-09 LAB — COMPREHENSIVE METABOLIC PANEL
ALT: 13 U/L — ABNORMAL LOW (ref 14–54)
ANION GAP: 10 (ref 5–15)
AST: 16 U/L (ref 15–41)
Albumin: 3.6 g/dL (ref 3.5–5.0)
Alkaline Phosphatase: 69 U/L (ref 38–126)
BUN: 18 mg/dL (ref 6–20)
CHLORIDE: 105 mmol/L (ref 101–111)
CO2: 23 mmol/L (ref 22–32)
Calcium: 9.2 mg/dL (ref 8.9–10.3)
Creatinine, Ser: 1.07 mg/dL — ABNORMAL HIGH (ref 0.44–1.00)
GFR calc non Af Amer: 46 mL/min — ABNORMAL LOW (ref >60)
GFR, EST AFRICAN AMERICAN: 53 mL/min — AB (ref >60)
Glucose, Bld: 162 mg/dL — ABNORMAL HIGH (ref 65–99)
POTASSIUM: 3.6 mmol/L (ref 3.5–5.1)
Sodium: 138 mmol/L (ref 135–145)
TOTAL PROTEIN: 6.4 g/dL — AB (ref 6.5–8.1)
Total Bilirubin: 1 mg/dL (ref 0.3–1.2)

## 2017-07-09 LAB — CBC
HEMATOCRIT: 44.3 % (ref 36.0–46.0)
HEMOGLOBIN: 14.8 g/dL (ref 12.0–15.0)
MCH: 31.7 pg (ref 26.0–34.0)
MCHC: 33.4 g/dL (ref 30.0–36.0)
MCV: 94.9 fL (ref 78.0–100.0)
Platelets: 133 10*3/uL — ABNORMAL LOW (ref 150–400)
RBC: 4.67 MIL/uL (ref 3.87–5.11)
RDW: 14.2 % (ref 11.5–15.5)
WBC: 12.1 10*3/uL — AB (ref 4.0–10.5)

## 2017-07-09 LAB — CBG MONITORING, ED: Glucose-Capillary: 145 mg/dL — ABNORMAL HIGH (ref 65–99)

## 2017-07-09 LAB — I-STAT CG4 LACTIC ACID, ED: LACTIC ACID, VENOUS: 1.5 mmol/L (ref 0.5–1.9)

## 2017-07-09 NOTE — ED Provider Notes (Addendum)
[place] DEPT Provider Note: Georgena Spurling, MD, FACEP  CSN: [id number] MRN: [id number] ARRIVAL: 07/09/17 at 2207 ROOM: WA24/WA24   CHIEF COMPLAINT  Altered Mental Status  Level 5 Caveat: dementia; altered mental status HISTORY OF PRESENT ILLNESS  07/09/17 11:08 PM Anna [name] is a 83 y.o. female with a history of Alzheimer's disease.  She has had 2 shots in the sacrum last month for coccygodynia.  The most recent was at the end of October.  Over the past 2-3 days she has developed a small draining wound in her coccygeal area.  The drainage is described as purulent and foul-smelling.  There is no significant surrounding erythema or swelling but there is moderate pain at the site.  Throughout the day today she has been weaker than usual, not verbal although she is minimally verbal at baseline, and had a fever to 101 at home.  Her daughter gave her Tylenol for the fever with apparent resolution.  On recheck here her temperature was noted to be 101.1 rectally.  She has chronic atrial fibrillation and was noted to be in atrial fibrillation on arrival; she is not on anticoagulation apart from aspirin.  Due to a combination of coccygeal pain and weakness she was unable to stand and assist herself with dressing earlier.   Code sepsis initiated after initial evaluation.   Past Medical History:  Diagnosis Date  . Alzheimer's disease   . Atrial fibrillation ([place])   . Bronchitis   . CHF (congestive heart failure) ([place])   . Chronic renal insufficiency, stage III (moderate) (H[facility])   . Diabetes mellitus without complication ([place])   . Dyspnea    with exertion  . GERD (gastroesophageal reflux disease)   . GI bleeding 11/2013   due to supratherapeutic INR  . Hyperlipidemia   . Hypertension   . Hyperthyroidism    on amiodarone  . LBBB (left bundle branch block)   . Overactive bladder   . Overweight   . Persistent atrial fibrillation ([place])   . Pneumonia   . Thyroid mass    LLL  . UTI  (urinary tract infection)   . Vitamin B12 deficiency   . Vitamin D deficiency     Past Surgical History:  Procedure Laterality Date  . ABDOMINAL HYSTERECTOMY    . ESOPHAGOGASTRODUODENOSCOPY (EGD) WITH PROPOFOL N/A 02/16/2017   Performed by Irene Shipper, MD at [facility] ENDOSCOPY    Family History  Problem Relation Age of Onset  . Heart disease Father   . Diabetes Mother   . Thyroid disease Neg Hx     Social History   Tobacco Use  . Smoking status: Never Smoker  . Smokeless tobacco: Never Used  Substance Use Topics  . Alcohol use: No    Alcohol/week: 0.0 oz  . Drug use: No    Prior to Admission medications   Medication Sig Start Date End Date Taking? Authorizing Provider  ACCU-CHEK AVIVA PLUS test strip PATIENT TO CHECK SUGARS ONCE DAILY. 11/19/15  Yes [provider]  acetaminophen (TYLENOL) 500 MG tablet Take 500 mg by mouth every 6 (six) hours as needed for fever.    Yes [provider]  aspirin 325 MG tablet Take 325 mg by mouth daily.   Yes [provider]  calcitRIOL (ROCALTROL) 0.5 MCG capsule Take 1 capsule (0.5 mcg total) by mouth 3 (three) times a week. Monday, Wednesday, and Friday only 01/06/17  Yes Burchette, Alinda Sierras, MD  carbamide peroxide (DEBROX) 6.5 % OTIC solution  Place 5 drops into both ears 2 (two) times daily. Patient taking differently: Place 5 drops 2 (two) times daily as needed into both ears (irritation).  03/22/17  Yes Nafziger, Tommi Rumps, NP  docusate sodium (COLACE) 100 MG capsule Take 100 mg by mouth daily as needed for mild constipation.   Yes [provider]  donepezil (ARICEPT) 10 MG tablet TAKE 1 TABLET ONCE DAILY. 01/17/17  Yes Nafziger, Tommi Rumps, NP  fluticasone (FLONASE) 50 MCG/ACT nasal spray Place 2 sprays into both nostrils daily.   Yes [provider]  furosemide (LASIX) 20 MG tablet Take 1.5 tablets (30 mg total) by mouth daily. Take an extra 0.5 tablet daily as needed. 03/10/17  Yes Skeet Latch, MD    glimepiride (AMARYL) 2 MG tablet TAKE 1 TABLET ONCE DAILY. 04/19/17  Yes Nafziger, Tommi Rumps, NP  Iron-FA-B Cmp-C-Biot-Probiotic (FUSION PLUS) CAPS Take 1 tablet by mouth daily. Patient taking differently: Take 1 tablet once a week by mouth.  01/13/17  Yes Nafziger, Tommi Rumps, NP  loratadine (ALAVERT) 10 MG tablet Take 5 mg daily by mouth.    Yes [provider]  memantine (NAMENDA) 5 MG tablet Take 1 tablet (5 mg total) by mouth at bedtime. 05/26/17  Yes Nafziger, Tommi Rumps, NP  metoprolol tartrate (LOPRESSOR) 25 MG tablet Take 12.5 mg 2 (two) times daily by mouth. Take 1/2 tablet by mouth twice a day    Yes [provider]  mirabegron ER (MYRBETRIQ) 25 MG TB24 tablet Take 25 mg by mouth daily.   Yes [provider]  omeprazole (PRILOSEC) 40 MG capsule Take 30- 60 min before your first and last meals of the day Patient taking differently: Take 40 mg daily by mouth. Take 30- 60 min before your first and last meals of the day 01/05/17  Yes Tanda Rockers, MD  Polyethyl Glycol-Propyl Glycol (SYSTANE) 0.4-0.3 % SOLN Apply 1 drop to eye 2 (two) times daily as needed (dry eyes).   Yes [provider]  potassium chloride SA (K-DUR,KLOR-CON) 20 MEQ tablet Take 20 mEq by mouth daily.   Yes [provider]  vitamin B-12 (CYANOCOBALAMIN) 500 MCG tablet Take 500 mcg by mouth daily.   Yes [provider]    Allergies Pollen extract   REVIEW OF SYSTEMS  Negative except as noted here or in the History of Present Illness.   PHYSICAL EXAMINATION  Initial Vital Signs Blood pressure (!) 157/77, pulse 90, temperature 98.5 F (36.9 C), temperature source Axillary, resp. rate 20, SpO2 97 %.  Examination General: Well-developed, well-nourished female in no acute distress; appearance consistent with age of record HENT: normocephalic; atraumatic Eyes: pupils equal, round and reactive to light Neck: supple Heart: Irregular rhythm; frequent ectopy Lungs: clear to  auscultation bilaterally Abdomen: soft; nondistended; nontender; no masses or hepatosplenomegaly; bowel sounds present Extremities: No deformity; full range of motion; pulses normal Neurologic: Awake, lethargic; non-verbal; noted to move all extremities Skin: Warm and dry; small ulcer at base of coccyx with fibrinous material in wound, no significant surrounding erythema, tenderness or drainage; wound swabbed for culture    RESULTS  Summary of this visit's results, reviewed by myself:   EKG Interpretation  Date/Time:  Sunday July 09 2017 22:36:14 EST Ventricular Rate:  90 PR Interval:    QRS Duration: 164 QT Interval:  366 QTC Calculation: 448 R Axis:   60 Text Interpretation:  Atrial fibrillation Premature ventricular complexes Left bundle branch block Rate is faster Confirmed by Krissa Utke [phone]) on 07/09/2017 11:45:43 PM  Laboratory Studies: Results for orders placed or performed during the hospital encounter of 07/09/17 (from the past 24 hour(s))  Comprehensive metabolic panel     Status: Abnormal   Collection Time: 07/09/17 10:41 PM  Result Value Ref Range   Sodium 138 135 - 145 mmol/L   Potassium 3.6 3.5 - 5.1 mmol/L   Chloride 105 101 - 111 mmol/L   CO2 23 22 - 32 mmol/L   Glucose, Bld 162 (H) 65 - 99 mg/dL   BUN 18 6 - 20 mg/dL   Creatinine, Ser 1.07 (H) 0.44 - 1.00 mg/dL   Calcium 9.2 8.9 - 10.3 mg/dL   Total Protein 6.4 (L) 6.5 - 8.1 g/dL   Albumin 3.6 3.5 - 5.0 g/dL   AST 16 15 - 41 U/L   ALT 13 (L) 14 - 54 U/L   Alkaline Phosphatase 69 38 - 126 U/L   Total Bilirubin 1.0 0.3 - 1.2 mg/dL   GFR calc non Af Amer 46 (L) >60 mL/min   GFR calc Af Amer 53 (L) >60 mL/min   Anion gap 10 5 - 15  CBC     Status: Abnormal   Collection Time: 07/09/17 10:41 PM  Result Value Ref Range   WBC 12.1 (H) 4.0 - 10.5 K/uL   RBC 4.67 3.87 - 5.11 MIL/uL   Hemoglobin 14.8 12.0 - 15.0 g/dL   HCT 44.3 36.0 - 46.0 %   MCV 94.9 78.0 - 100.0 fL   MCH 31.7 26.0 - 34.0 pg     MCHC 33.4 30.0 - 36.0 g/dL   RDW 14.2 11.5 - 15.5 %   Platelets 133 (L) 150 - 400 K/uL  CBG monitoring, ED     Status: Abnormal   Collection Time: 07/09/17 10:47 PM  Result Value Ref Range   Glucose-Capillary 145 (H) 65 - 99 mg/dL   Comment 1 Notify RN   Urinalysis, Routine w reflex microscopic     Status: Abnormal   Collection Time: 07/09/17 11:08 PM  Result Value Ref Range   Color, Urine YELLOW YELLOW   APPearance CLEAR CLEAR   Specific Gravity, Urine 1.017 1.005 - 1.030   pH 5.0 5.0 - 8.0   Glucose, UA NEGATIVE NEGATIVE mg/dL   Hgb urine dipstick SMALL (A) NEGATIVE   Bilirubin Urine NEGATIVE NEGATIVE   Ketones, ur 5 (A) NEGATIVE mg/dL   Protein, ur NEGATIVE NEGATIVE mg/dL   Nitrite NEGATIVE NEGATIVE   Leukocytes, UA TRACE (A) NEGATIVE   RBC / HPF 0-5 0 - 5 RBC/hpf   WBC, UA 0-5 0 - 5 WBC/hpf   Bacteria, UA MANY (A) NONE SEEN   Squamous Epithelial / LPF NONE SEEN NONE SEEN  I-Stat CG4 Lactic Acid, ED     Status: None   Collection Time: 07/09/17 11:52 PM  Result Value Ref Range   Lactic Acid, Venous 1.50 0.5 - 1.9 mmol/L   Imaging Studies: Ct Pelvis Wo Contrast  Result Date: 07/10/2017 CLINICAL DATA:  Fever, coccygeal pain with draining wound s/p steroid injection 3 weeks, wbc's 12.1, hx of hysterectomy, x-ray sacrum/ coccyx, no prev ct's of pelvis Difficult for patient to follow instructions due to dementia EXAM: CT PELVIS WITHOUT CONTRAST TECHNIQUE: Multidetector CT imaging of the pelvis was performed following the standard protocol without intravenous contrast. Examination is technically limited due to motion artifact. COMPARISON:  None. FINDINGS: Urinary Tract: Bladder wall is not thickened and no bladder filling defects are identified. Distal ureters are decompressed. Bowel: The visualized portions of the  colon and small bowel are not abnormally distended. Stool fills the rectosigmoid colon. Scattered diverticula without evidence of diverticulitis. Vascular/Lymphatic:  Calcifications in the iliac and external iliac artery's. No aneurysm. Reproductive:  Uterus and ovaries are not enlarged. Other: Small skin defect to the left of the coccyx with underlying gas and soft tissue density measuring 3 cm in diameter. This is consistent with small abscess and fistula. There is suggestion of some bone loss in the adjacent coccyx which may indicate osteomyelitis. Focal loss of presacral fat plane could represent a fistula to the posterior rectum. Musculoskeletal: Degenerative changes in the lower lumbar spine. No expansile bone lesions are appreciated. IMPRESSION: Small abscess and fistula in the soft tissues to the left of the coccyx with possible focal osteomyelitis in the adjacent coccyx. Focal loss of presacral fat plane could represent a fistula to the posterior rectum. Electronically Signed   By: Lucienne Capers M.D.   On: 07/10/2017 02:38   Dg Chest Port 1 View  Result Date: 07/10/2017 CLINICAL DATA:  Initial evaluation for acute fever. EXAM: PORTABLE CHEST 1 VIEW COMPARISON:  Prior radiograph from 03/09/2017. FINDINGS: Moderate cardiomegaly, stable. Mediastinal silhouette within normal limits. Aortic atherosclerosis. Lungs are hypoinflated. Mild diffuse vascular congestion with interstitial prominence, suggesting mild pulmonary interstitial edema. Superimposed left basilar atelectasis and/ or scarring, similar to previous. Suspected small bilateral pleural effusions. No consolidative opacity. No pneumothorax. No acute osseus abnormality. Osteopenia. Degenerative changes noted about the right shoulder. IMPRESSION: 1. Cardiomegaly with mild diffuse pulmonary vascular congestion with interstitial prominence, suggesting mild diffuse pulmonary interstitial edema. 2. Suspected small bilateral pleural effusions. 3. Aortic atherosclerosis. Electronically Signed   By: Jeannine Boga M.D.   On: 07/10/2017 00:14    ED COURSE  Nursing notes and initial vitals signs, including  pulse oximetry, reviewed.  Vitals:   07/09/17 2300 07/09/17 2330 07/10/17 0000 07/10/17 0149  BP: 127/64 140/71 116/73   Pulse: 83  80   Resp: 20 (!) 27    Temp:      TempSrc:      SpO2: 96%  97%   Weight:    80.7 kg (178 lb)   Antibiotics ordered for osteomyelitis and abscess of coccyx.  Dr. Alcario Drought of [facility] to admit.  PROCEDURES   CRITICAL CARE Performed by: Shanon Rosser L Total critical care time: 45 minutes Critical care time was exclusive of separately billable procedures and treating other patients. Critical care was necessary to treat or prevent imminent or life-threatening deterioration. Critical care was time spent personally by me on the following activities: development of treatment plan with patient and/or surrogate as well as nursing, discussions with consultants, evaluation of patient's response to treatment, examination of patient, obtaining history from patient or surrogate, ordering and performing treatments and interventions, ordering and review of laboratory studies, ordering and review of radiographic studies, pulse oximetry and re-evaluation of patient's condition.   ED DIAGNOSES     ICD-10-CM   1. Abscess of coccyx A18.01   2. Coccyxdynia M53.3 lidocaine (PF) (XYLOCAINE) 1 % injection 2 mL  3. 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    Shanon Rosser, MD 07/10/17 0345    Shanon Rosser, MD 07/10/17 [phone]

## 2017-07-09 NOTE — ED Triage Notes (Signed)
Brought in by EMS from home c/o AMS and temp of 101 this evening. Daughter stated pt has a small sore on her sacrum with pus draining. Pt had steroid shot 3 weeks ago for the sore. EMT EKG shows afib with RVR. Pt had tylenol for fever this evening.

## 2017-07-09 NOTE — ED Notes (Signed)
Bed: UL84 Expected date:  Expected time:  Means of arrival:  Comments: Hold ems

## 2017-07-10 ENCOUNTER — Encounter (HOSPITAL_COMMUNITY): Payer: Self-pay

## 2017-07-10 ENCOUNTER — Emergency Department (HOSPITAL_COMMUNITY): Payer: Medicare HMO

## 2017-07-10 ENCOUNTER — Other Ambulatory Visit: Payer: Self-pay

## 2017-07-10 DIAGNOSIS — F0281 Dementia in other diseases classified elsewhere with behavioral disturbance: Secondary | ICD-10-CM | POA: Diagnosis present

## 2017-07-10 DIAGNOSIS — M869 Osteomyelitis, unspecified: Secondary | ICD-10-CM | POA: Diagnosis not present

## 2017-07-10 DIAGNOSIS — M4628 Osteomyelitis of vertebra, sacral and sacrococcygeal region: Secondary | ICD-10-CM | POA: Diagnosis present

## 2017-07-10 DIAGNOSIS — N3281 Overactive bladder: Secondary | ICD-10-CM | POA: Diagnosis present

## 2017-07-10 DIAGNOSIS — Z7984 Long term (current) use of oral hypoglycemic drugs: Secondary | ICD-10-CM | POA: Diagnosis not present

## 2017-07-10 DIAGNOSIS — I10 Essential (primary) hypertension: Secondary | ICD-10-CM | POA: Diagnosis not present

## 2017-07-10 DIAGNOSIS — Z833 Family history of diabetes mellitus: Secondary | ICD-10-CM | POA: Diagnosis not present

## 2017-07-10 DIAGNOSIS — G309 Alzheimer's disease, unspecified: Secondary | ICD-10-CM

## 2017-07-10 DIAGNOSIS — E1169 Type 2 diabetes mellitus with other specified complication: Secondary | ICD-10-CM | POA: Diagnosis present

## 2017-07-10 DIAGNOSIS — N390 Urinary tract infection, site not specified: Secondary | ICD-10-CM | POA: Diagnosis present

## 2017-07-10 DIAGNOSIS — Z79899 Other long term (current) drug therapy: Secondary | ICD-10-CM | POA: Diagnosis not present

## 2017-07-10 DIAGNOSIS — E1122 Type 2 diabetes mellitus with diabetic chronic kidney disease: Secondary | ICD-10-CM | POA: Diagnosis present

## 2017-07-10 DIAGNOSIS — N183 Chronic kidney disease, stage 3 (moderate): Secondary | ICD-10-CM | POA: Diagnosis present

## 2017-07-10 DIAGNOSIS — E785 Hyperlipidemia, unspecified: Secondary | ICD-10-CM | POA: Diagnosis present

## 2017-07-10 DIAGNOSIS — A1801 Tuberculosis of spine: Secondary | ICD-10-CM | POA: Diagnosis not present

## 2017-07-10 DIAGNOSIS — I481 Persistent atrial fibrillation: Secondary | ICD-10-CM | POA: Diagnosis present

## 2017-07-10 DIAGNOSIS — M533 Sacrococcygeal disorders, not elsewhere classified: Secondary | ICD-10-CM | POA: Diagnosis present

## 2017-07-10 DIAGNOSIS — Z8249 Family history of ischemic heart disease and other diseases of the circulatory system: Secondary | ICD-10-CM | POA: Diagnosis not present

## 2017-07-10 DIAGNOSIS — L0501 Pilonidal cyst with abscess: Secondary | ICD-10-CM | POA: Diagnosis present

## 2017-07-10 DIAGNOSIS — L89154 Pressure ulcer of sacral region, stage 4: Secondary | ICD-10-CM | POA: Diagnosis present

## 2017-07-10 DIAGNOSIS — I130 Hypertensive heart and chronic kidney disease with heart failure and stage 1 through stage 4 chronic kidney disease, or unspecified chronic kidney disease: Secondary | ICD-10-CM | POA: Diagnosis present

## 2017-07-10 DIAGNOSIS — Z7982 Long term (current) use of aspirin: Secondary | ICD-10-CM | POA: Diagnosis not present

## 2017-07-10 DIAGNOSIS — Z7951 Long term (current) use of inhaled steroids: Secondary | ICD-10-CM | POA: Diagnosis not present

## 2017-07-10 DIAGNOSIS — E1121 Type 2 diabetes mellitus with diabetic nephropathy: Secondary | ICD-10-CM | POA: Diagnosis not present

## 2017-07-10 DIAGNOSIS — L02212 Cutaneous abscess of back [any part, except buttock]: Secondary | ICD-10-CM | POA: Diagnosis present

## 2017-07-10 DIAGNOSIS — G9341 Metabolic encephalopathy: Secondary | ICD-10-CM | POA: Diagnosis present

## 2017-07-10 DIAGNOSIS — Z90710 Acquired absence of both cervix and uterus: Secondary | ICD-10-CM | POA: Diagnosis not present

## 2017-07-10 DIAGNOSIS — R05 Cough: Secondary | ICD-10-CM | POA: Diagnosis not present

## 2017-07-10 DIAGNOSIS — I5022 Chronic systolic (congestive) heart failure: Secondary | ICD-10-CM | POA: Diagnosis present

## 2017-07-10 DIAGNOSIS — F02818 Dementia in other diseases classified elsewhere, unspecified severity, with other behavioral disturbance: Secondary | ICD-10-CM | POA: Diagnosis present

## 2017-07-10 DIAGNOSIS — K219 Gastro-esophageal reflux disease without esophagitis: Secondary | ICD-10-CM | POA: Diagnosis present

## 2017-07-10 LAB — URINALYSIS, ROUTINE W REFLEX MICROSCOPIC
BILIRUBIN URINE: NEGATIVE
GLUCOSE, UA: NEGATIVE mg/dL
Ketones, ur: 5 mg/dL — AB
NITRITE: NEGATIVE
PH: 5 (ref 5.0–8.0)
Protein, ur: NEGATIVE mg/dL
SPECIFIC GRAVITY, URINE: 1.017 (ref 1.005–1.030)
Squamous Epithelial / LPF: NONE SEEN

## 2017-07-10 LAB — DIFFERENTIAL
BASOS ABS: 0 10*3/uL (ref 0.0–0.1)
Basophils Relative: 0 %
Eosinophils Absolute: 0 10*3/uL (ref 0.0–0.7)
Eosinophils Relative: 0 %
LYMPHS ABS: 1 10*3/uL (ref 0.7–4.0)
LYMPHS PCT: 9 %
Monocytes Absolute: 0.9 10*3/uL (ref 0.1–1.0)
Monocytes Relative: 8 %
NEUTROS PCT: 83 %
Neutro Abs: 9.1 10*3/uL — ABNORMAL HIGH (ref 1.7–7.7)

## 2017-07-10 LAB — GLUCOSE, CAPILLARY
GLUCOSE-CAPILLARY: 112 mg/dL — AB (ref 65–99)
Glucose-Capillary: 114 mg/dL — ABNORMAL HIGH (ref 65–99)
Glucose-Capillary: 123 mg/dL — ABNORMAL HIGH (ref 65–99)
Glucose-Capillary: 108 mg/dL — ABNORMAL HIGH (ref 65–99)
Glucose-Capillary: 219 mg/dL — ABNORMAL HIGH (ref 65–99)

## 2017-07-10 LAB — CBC
HEMATOCRIT: 41.3 % (ref 36.0–46.0)
HEMOGLOBIN: 13.5 g/dL (ref 12.0–15.0)
MCH: 31.2 pg (ref 26.0–34.0)
MCHC: 32.7 g/dL (ref 30.0–36.0)
MCV: 95.4 fL (ref 78.0–100.0)
Platelets: 143 10*3/uL — ABNORMAL LOW (ref 150–400)
RBC: 4.33 MIL/uL (ref 3.87–5.11)
RDW: 14 % (ref 11.5–15.5)
WBC: 11.7 10*3/uL — AB (ref 4.0–10.5)

## 2017-07-10 LAB — COMPREHENSIVE METABOLIC PANEL
ALBUMIN: 3.2 g/dL — AB (ref 3.5–5.0)
ALK PHOS: 72 U/L (ref 38–126)
ALT: 13 U/L — ABNORMAL LOW (ref 14–54)
AST: 21 U/L (ref 15–41)
Anion gap: 6 (ref 5–15)
BILIRUBIN TOTAL: 1.8 mg/dL — AB (ref 0.3–1.2)
BUN: 14 mg/dL (ref 6–20)
CALCIUM: 8.9 mg/dL (ref 8.9–10.3)
CO2: 27 mmol/L (ref 22–32)
Chloride: 108 mmol/L (ref 101–111)
Creatinine, Ser: 1.05 mg/dL — ABNORMAL HIGH (ref 0.44–1.00)
GFR calc Af Amer: 54 mL/min — ABNORMAL LOW (ref >60)
GFR calc non Af Amer: 47 mL/min — ABNORMAL LOW (ref >60)
GLUCOSE: 117 mg/dL — AB (ref 65–99)
POTASSIUM: 3 mmol/L — AB (ref 3.5–5.1)
Sodium: 141 mmol/L (ref 135–145)
TOTAL PROTEIN: 5.9 g/dL — AB (ref 6.5–8.1)

## 2017-07-10 LAB — PROTIME-INR
INR: 1.13
Prothrombin Time: 14.4 seconds (ref 11.4–15.2)

## 2017-07-10 LAB — SEDIMENTATION RATE: SED RATE: 9 mm/h (ref 0–22)

## 2017-07-10 LAB — BRAIN NATRIURETIC PEPTIDE: B NATRIURETIC PEPTIDE 5: 306.2 pg/mL — AB (ref 0.0–100.0)

## 2017-07-10 LAB — APTT: aPTT: 25 seconds (ref 24–36)

## 2017-07-10 LAB — C-REACTIVE PROTEIN: CRP: 8.2 mg/dL — ABNORMAL HIGH (ref <1.0)

## 2017-07-10 MED ORDER — ASPIRIN 325 MG PO TABS
325.0000 mg | ORAL_TABLET | Freq: Every day | ORAL | Status: DC
  Administered 2017-07-10: 325 mg via ORAL
  Filled 2017-07-10: qty 1

## 2017-07-10 MED ORDER — LIDOCAINE HCL (PF) 1 % IJ SOLN: 2.0000 mL | Freq: Once | INTRAMUSCULAR | Status: DC

## 2017-07-10 MED ORDER — ONDANSETRON HCL 4 MG/2ML IJ SOLN: 4.0000 mg | Freq: Four times a day (QID) | INTRAMUSCULAR | Status: DC | PRN

## 2017-07-10 MED ORDER — MORPHINE SULFATE (PF) 4 MG/ML IV SOLN: 0.5000 mg | INTRAVENOUS | Status: DC | PRN

## 2017-07-10 MED ORDER — VITAMIN B-12 1000 MCG PO TABS
500.0000 ug | ORAL_TABLET | Freq: Every day | ORAL | Status: DC
  Administered 2017-07-10 – 2017-07-17 (×7): 500 ug via ORAL
  Filled 2017-07-10 (×7): qty 1

## 2017-07-10 MED ORDER — VANCOMYCIN HCL IN DEXTROSE 750-5 MG/150ML-% IV SOLN
750.0000 mg | INTRAVENOUS | Status: DC
  Administered 2017-07-11: 750 mg via INTRAVENOUS
  Filled 2017-07-10: qty 150

## 2017-07-10 MED ORDER — ACETAMINOPHEN 650 MG RE SUPP: 650.0000 mg | Freq: Four times a day (QID) | RECTAL | Status: DC | PRN

## 2017-07-10 MED ORDER — ONDANSETRON HCL 4 MG PO TABS
4.0000 mg | ORAL_TABLET | Freq: Four times a day (QID) | ORAL | Status: DC | PRN
  Administered 2017-07-12: 4 mg via ORAL
  Filled 2017-07-10: qty 1

## 2017-07-10 MED ORDER — METOPROLOL TARTRATE 25 MG PO TABS
12.5000 mg | ORAL_TABLET | Freq: Two times a day (BID) | ORAL | Status: DC
  Administered 2017-07-10 – 2017-07-14 (×10): 12.5 mg via ORAL
  Filled 2017-07-10 (×10): qty 1

## 2017-07-10 MED ORDER — LORATADINE 10 MG PO TABS
5.0000 mg | ORAL_TABLET | Freq: Every day | ORAL | Status: DC
  Administered 2017-07-10 – 2017-07-17 (×7): 5 mg via ORAL
  Filled 2017-07-10 (×7): qty 1

## 2017-07-10 MED ORDER — DONEPEZIL HCL 10 MG PO TABS
10.0000 mg | ORAL_TABLET | Freq: Every day | ORAL | Status: DC
  Administered 2017-07-10 – 2017-07-16 (×7): 10 mg via ORAL
  Filled 2017-07-10 (×2): qty 1
  Filled 2017-07-10: qty 2
  Filled 2017-07-10 (×4): qty 1

## 2017-07-10 MED ORDER — ACETAMINOPHEN 325 MG PO TABS
650.0000 mg | ORAL_TABLET | Freq: Four times a day (QID) | ORAL | Status: DC | PRN
  Administered 2017-07-10 – 2017-07-16 (×6): 650 mg via ORAL
  Filled 2017-07-10 (×6): qty 2

## 2017-07-10 MED ORDER — MIRABEGRON ER 25 MG PO TB24
25.0000 mg | ORAL_TABLET | Freq: Every day | ORAL | Status: DC
  Administered 2017-07-10 – 2017-07-17 (×7): 25 mg via ORAL
  Filled 2017-07-10 (×8): qty 1

## 2017-07-10 MED ORDER — PANTOPRAZOLE SODIUM 40 MG PO TBEC
80.0000 mg | DELAYED_RELEASE_TABLET | Freq: Every day | ORAL | Status: DC
  Administered 2017-07-10: 80 mg via ORAL
  Filled 2017-07-10: qty 2

## 2017-07-10 MED ORDER — PIPERACILLIN-TAZOBACTAM 3.375 G IVPB 30 MIN
3.3750 g | Freq: Once | INTRAVENOUS | Status: AC
  Administered 2017-07-10: 3.375 g via INTRAVENOUS
  Filled 2017-07-10: qty 50

## 2017-07-10 MED ORDER — VANCOMYCIN HCL 10 G IV SOLR
1500.0000 mg | Freq: Once | INTRAVENOUS | Status: AC
  Administered 2017-07-10: 1500 mg via INTRAVENOUS
  Filled 2017-07-10: qty 1500

## 2017-07-10 MED ORDER — ENOXAPARIN SODIUM 40 MG/0.4ML ~~LOC~~ SOLN
40.0000 mg | SUBCUTANEOUS | Status: DC
  Administered 2017-07-10: 40 mg via SUBCUTANEOUS
  Filled 2017-07-10: qty 0.4

## 2017-07-10 MED ORDER — SODIUM CHLORIDE 0.9 % IV BOLUS (SEPSIS): 250.0000 mL | Freq: Once | INTRAVENOUS | Status: DC

## 2017-07-10 MED ORDER — FLUTICASONE PROPIONATE 50 MCG/ACT NA SUSP
2.0000 | Freq: Every day | NASAL | Status: DC
  Administered 2017-07-10 – 2017-07-17 (×8): 2 via NASAL
  Filled 2017-07-10: qty 16

## 2017-07-10 MED ORDER — PIPERACILLIN-TAZOBACTAM 3.375 G IVPB
3.3750 g | Freq: Three times a day (TID) | INTRAVENOUS | Status: DC
  Administered 2017-07-10 – 2017-07-11 (×4): 3.375 g via INTRAVENOUS
  Filled 2017-07-10 (×5): qty 50

## 2017-07-10 MED ORDER — ENOXAPARIN SODIUM 40 MG/0.4ML ~~LOC~~ SOLN
40.0000 mg | SUBCUTANEOUS | Status: DC
  Administered 2017-07-11 – 2017-07-16 (×6): 40 mg via SUBCUTANEOUS
  Filled 2017-07-10 (×6): qty 0.4

## 2017-07-10 MED ORDER — ACETAMINOPHEN 500 MG PO TABS: 500.0000 mg | ORAL_TABLET | Freq: Four times a day (QID) | ORAL | Status: DC | PRN

## 2017-07-10 MED ORDER — VANCOMYCIN HCL IN DEXTROSE 1-5 GM/200ML-% IV SOLN: 1000.0000 mg | Freq: Once | INTRAVENOUS | Status: DC

## 2017-07-10 MED ORDER — DOCUSATE SODIUM 100 MG PO CAPS: 100.0000 mg | ORAL_CAPSULE | Freq: Every day | ORAL | Status: DC | PRN

## 2017-07-10 MED ORDER — INSULIN ASPART 100 UNIT/ML ~~LOC~~ SOLN
0.0000 [IU] | Freq: Three times a day (TID) | SUBCUTANEOUS | Status: DC
  Administered 2017-07-10 – 2017-07-12 (×2): 1 [IU] via SUBCUTANEOUS
  Administered 2017-07-12 – 2017-07-14 (×5): 2 [IU] via SUBCUTANEOUS
  Administered 2017-07-14: 3 [IU] via SUBCUTANEOUS
  Administered 2017-07-15: 2 [IU] via SUBCUTANEOUS
  Administered 2017-07-15 (×2): 3 [IU] via SUBCUTANEOUS
  Administered 2017-07-16: 2 [IU] via SUBCUTANEOUS
  Administered 2017-07-16: 1 [IU] via SUBCUTANEOUS
  Administered 2017-07-16: 3 [IU] via SUBCUTANEOUS
  Administered 2017-07-17: 1 [IU] via SUBCUTANEOUS
  Administered 2017-07-17: 2 [IU] via SUBCUTANEOUS

## 2017-07-10 MED ORDER — MEMANTINE HCL 10 MG PO TABS
5.0000 mg | ORAL_TABLET | Freq: Every day | ORAL | Status: DC
  Administered 2017-07-10 – 2017-07-16 (×7): 5 mg via ORAL
  Filled 2017-07-10 (×7): qty 1

## 2017-07-10 MED ORDER — PANTOPRAZOLE SODIUM 40 MG PO TBEC
40.0000 mg | DELAYED_RELEASE_TABLET | Freq: Every day | ORAL | Status: DC
  Administered 2017-07-12 – 2017-07-17 (×6): 40 mg via ORAL
  Filled 2017-07-10 (×7): qty 1

## 2017-07-10 MED ORDER — TAB-A-VITE/IRON PO TABS
1.0000 | ORAL_TABLET | ORAL | Status: DC
  Filled 2017-07-10: qty 1

## 2017-07-10 MED ORDER — CALCITRIOL 0.5 MCG PO CAPS
0.5000 ug | ORAL_CAPSULE | ORAL | Status: DC
  Administered 2017-07-10 – 2017-07-17 (×4): 0.5 ug via ORAL
  Filled 2017-07-10 (×4): qty 1

## 2017-07-10 MED ORDER — PIPERACILLIN-TAZOBACTAM 3.375 G IVPB 30 MIN: 3.3750 g | Freq: Once | INTRAVENOUS | Status: DC

## 2017-07-10 MED ORDER — POLYVINYL ALCOHOL 1.4 % OP SOLN: 1.0000 [drp] | Freq: Two times a day (BID) | OPHTHALMIC | Status: DC | PRN

## 2017-07-10 MED ORDER — SODIUM CHLORIDE 0.9 % IV BOLUS (SEPSIS)
1000.0000 mL | Freq: Once | INTRAVENOUS | Status: AC
  Administered 2017-07-10: 1000 mL via INTRAVENOUS

## 2017-07-10 NOTE — Progress Notes (Signed)
PROGRESS NOTE    Anna [name]  ZOX:[id number] DOB: [birth year] DOA: 07/09/2017 PCP: Anna Peng, NP   Brief Narrative:  Anna [name] is Anna [name] 83 y.o. female with medical history significant of Alzheimer's dementia.  Patient has had 2 shots in sacrum last month for coccygodynia, last on 10/29.  Over the past 2-3 days has developed small draining wound in coccygeal area.  Drainage is purulent and foul smelling.  Today Fever 101.1, improved with tylenol.  Generalized weakness more than usual and non-verbal today though she is minimally verbal at baseline.  Despite this daughter states she did take all PO meds today, was eating and drinking normally (had 1200 cc PO fluid intake over day today which is her recommended limit).     Assessment & Plan:   Principal Problem:   Abscess of coccyx Active Problems:   Benign essential HTN   DM (diabetes mellitus) type II controlled with renal manifestation (HCC)   Acute metabolic encephalopathy   Alzheimer's dementia with behavioral disturbance   Pilonidal abscess   1. Abscess / osteo of coccyx (? Fistula) 1. Zosyn and vanc 2. Cultures pending 3. Surgery c/s, appreciate rcs - possible surgery later in afternoon today or tomorrow 4. Follow up ESR/CRP. 5. Consider ID c/s if surgical findings c/w osteo  2. Dementia  Metabolic Encephalopathy - at baseline, daughter notes pt doesn't say much, maybe yes or no.  Currently not responding to questions.  Will attend to voice.   1. Suspect component of delirium due to infection/pain 2. Continue to monitor 3. Delirium precautions  3. HFrEF (EF 35-40%)  1. Holding home lasix (30 mg daily) for now, CXR with mild interstitial edema.  2. Home metoprolol  4. Atrial Fibrillation:  Chadvasc at least 6, only on ASA right now (family decided on this after what sounds like medication error in the past - followed by outpatient cardiology) 1. Continue metop 2. Hold ASA  5. HTN - continue  metoprolol  6. DM2 - 1. Hold home meds 2. Sensitive SSI AC for now  7. Continue mirabegron  8. GERD: home PPI   DVT prophylaxis: lovenox Code Status: full  Family Communication: daughter at bedside Disposition Plan: pending improvement, PT/OT eval, treatment of abscess/possible osteo   Consultants:   surgery  Procedures: (Don't include imaging studies which can be auto populated. Include things that cannot be auto populated i.e. Echo, Carotid and venous dopplers, Foley, Bipap, HD, tubes/drains, wound vac, central lines etc)  none  Antimicrobials: (specify start and planned stop date. Auto populated tables are space occupying and do not give end dates) Anti-infectives (From admission, onward)   Start     Dose/Rate Route Frequency Ordered Stop   07/11/17 0400  vancomycin (VANCOCIN) IVPB 750 mg/150 ml premix     750 mg 150 mL/hr over 60 Minutes Intravenous Every 24 hours 07/10/17 0318     07/10/17 0800  piperacillin-tazobactam (ZOSYN) IVPB 3.375 g     3.375 g 12.5 mL/hr over 240 Minutes Intravenous Every 8 hours 07/10/17 0318     07/10/17 0200  piperacillin-tazobactam (ZOSYN) IVPB 3.375 g  Status:  Discontinued     3.375 g 100 mL/hr over 30 Minutes Intravenous  Once 07/10/17 0145 07/10/17 0146   07/10/17 0200  vancomycin (VANCOCIN) IVPB 1000 mg/200 mL premix  Status:  Discontinued     1,000 mg 200 mL/hr over 60 Minutes Intravenous  Once 07/10/17 0145 07/10/17 0146   07/10/17 0200  vancomycin (VANCOCIN) 1,500 mg in sodium  chloride 0.9 % 500 mL IVPB     1,500 mg 250 mL/hr over 120 Minutes Intravenous  Once 07/10/17 0150 07/10/17 0400   07/10/17 0200  piperacillin-tazobactam (ZOSYN) IVPB 3.375 g     3.375 g 100 mL/hr over 30 Minutes Intravenous  Once 07/10/17 0151 07/10/17 0326        Subjective: Limited due to dementia. Rolling in bed, appears uncomfortable. Daughter notes she recently had injections to this site. Going on for past 2-3 days.  She noticed odor. Malaise,  fever.   She's generally minimally verbal  Objective: Vitals:   07/10/17 0149 07/10/17 0430 07/10/17 0511 07/10/17 0533  BP:  (!) 132/58 125/60   Pulse:  83 85   Resp:   20   Temp:    (!) 101 F (38.3 C)  TempSrc:   Axillary Rectal  SpO2:  98%    Weight: 80.7 kg (178 lb)  77.2 kg (170 lb 3.1 oz)   Height:   _0  (1.651 m)     Intake/Output Summary (Last 24 hours) at 07/10/2017 1107 Last data filed at 07/10/2017 0326 Gross per 24 hour  Intake 1050 ml  Output -  Net 1050 ml   Filed Weights   07/10/17 0149 07/10/17 0511  Weight: 80.7 kg (178 lb) 77.2 kg (170 lb 3.1 oz)    Examination:  General exam: appears uncofmortable Respiratory system: Clear to auscultation. Respiratory effort normal. Cardiovascular system: S1 & S2 heard, RRR. No JVD, murmurs, rubs, gallops or clicks. No pedal edema. Gastrointestinal system: Abdomen is nondistended, soft and nontender. No organomegaly or masses felt. Normal bowel sounds heard. Central nervous system: Alert and oriented. No focal neurological deficits. Extremities: Symmetric 5 x 5 power. Skin: sacral wound  Psychiatry: Judgement and insight appear normal. Mood & affect appropriate.     Data Reviewed: I have personally reviewed following labs and imaging studies  CBC: Recent Labs  Lab 07/09/17 2241  WBC 12.1*  NEUTROABS 9.1*  HGB 14.8  HCT 44.3  MCV 94.9  PLT 332*   Basic Metabolic Panel: Recent Labs  Lab 07/09/17 2241  NA 138  K 3.6  CL 105  CO2 23  GLUCOSE 162*  BUN 18  CREATININE 1.07*  CALCIUM 9.2   GFR: Estimated Creatinine Clearance: 38.8 mL/min (Lamir Racca) (by C-G formula based on SCr of 1.07 mg/dL (H)). Liver Function Tests: Recent Labs  Lab 07/09/17 2241  AST 16  ALT 13*  ALKPHOS 69  BILITOT 1.0  PROT 6.4*  ALBUMIN 3.6   No results for input(s): LIPASE, AMYLASE in the last 168 hours. No results for input(s): AMMONIA in the last 168 hours. Coagulation Profile: No results for input(s): INR, PROTIME  in the last 168 hours. Cardiac Enzymes: No results for input(s): CKTOTAL, CKMB, CKMBINDEX, TROPONINI in the last 168 hours. BNP (last 3 results) No results for input(s): PROBNP in the last 8760 hours. HbA1C: No results for input(s): HGBA1C in the last 72 hours. CBG: Recent Labs  Lab 07/09/17 2247 07/10/17 0533 07/10/17 0813  GLUCAP 145* 114* 123*   Lipid Profile: No results for input(s): CHOL, HDL, LDLCALC, TRIG, CHOLHDL, LDLDIRECT in the last 72 hours. Thyroid Function Tests: No results for input(s): TSH, T4TOTAL, FREET4, T3FREE, THYROIDAB in the last 72 hours. Anemia Panel: No results for input(s): VITAMINB12, FOLATE, FERRITIN, TIBC, IRON, RETICCTPCT in the last 72 hours. Sepsis Labs: Recent Labs  Lab 07/09/17 2352  LATICACIDVEN 1.50    Recent Results (from the past 240 hour(s))  Wound  or Superficial Culture     Status: None (Preliminary result)   Collection Time: 07/09/17 11:29 PM  Result Value Ref Range Status   Specimen Description SACRAL  Final   Special Requests NONE  Final   Gram Stain   Final    RARE WBC PRESENT, PREDOMINANTLY MONONUCLEAR ABUNDANT GRAM POSITIVE COCCI IN PAIRS MODERATE GRAM NEGATIVE RODS Performed at [facility], [place] [address], [place], [place] [address]    Culture PENDING  Incomplete   Report Status PENDING  Incomplete         Radiology Studies: Ct Pelvis Wo Contrast  Result Date: 07/10/2017 CLINICAL DATA:  Fever, coccygeal pain with draining wound s/p steroid injection 3 weeks, wbc's 12.1, hx of hysterectomy, x-ray sacrum/ coccyx, no prev ct's of pelvis Difficult for patient to follow instructions due to dementia EXAM: CT PELVIS WITHOUT CONTRAST TECHNIQUE: Multidetector CT imaging of the pelvis was performed following the standard protocol without intravenous contrast. Examination is technically limited due to motion artifact. COMPARISON:  None. FINDINGS: Urinary Tract: Bladder wall is not thickened and no bladder filling defects  are identified. Distal ureters are decompressed. Bowel: The visualized portions of the colon and small bowel are not abnormally distended. Stool fills the rectosigmoid colon. Scattered diverticula without evidence of diverticulitis. Vascular/Lymphatic: Calcifications in the iliac and external iliac artery's. No aneurysm. Reproductive:  Uterus and ovaries are not enlarged. Other: Small skin defect to the left of the coccyx with underlying gas and soft tissue density measuring 3 cm in diameter. This is consistent with small abscess and fistula. There is suggestion of some bone loss in the adjacent coccyx which may indicate osteomyelitis. Focal loss of presacral fat plane could represent Myca Perno fistula to the posterior rectum. Musculoskeletal: Degenerative changes in the lower lumbar spine. No expansile bone lesions are appreciated. IMPRESSION: Small abscess and fistula in the soft tissues to the left of the coccyx with possible focal osteomyelitis in the adjacent coccyx. Focal loss of presacral fat plane could represent Kaedynce Tapp fistula to the posterior rectum. Electronically Signed   By: Lucienne Capers M.D.   On: 07/10/2017 02:38   Dg Chest Port 1 View  Result Date: 07/10/2017 CLINICAL DATA:  Initial evaluation for acute fever. EXAM: PORTABLE CHEST 1 VIEW COMPARISON:  Prior radiograph from 03/09/2017. FINDINGS: Moderate cardiomegaly, stable. Mediastinal silhouette within normal limits. Aortic atherosclerosis. Lungs are hypoinflated. Mild diffuse vascular congestion with interstitial prominence, suggesting mild pulmonary interstitial edema. Superimposed left basilar atelectasis and/ or scarring, similar to previous. Suspected small bilateral pleural effusions. No consolidative opacity. No pneumothorax. No acute osseus abnormality. Osteopenia. Degenerative changes noted about the right shoulder. IMPRESSION: 1. Cardiomegaly with mild diffuse pulmonary vascular congestion with interstitial prominence, suggesting mild diffuse  pulmonary interstitial edema. 2. Suspected small bilateral pleural effusions. 3. Aortic atherosclerosis. Electronically Signed   By: Jeannine Boga M.D.   On: 07/10/2017 00:14        Scheduled Meds: . aspirin  325 mg Oral Daily  . calcitRIOL  0.5 mcg Oral Once per day on Mon Wed Fri  . donepezil  10 mg Oral Daily  . [START ON 07/11/2017] enoxaparin (LOVENOX) injection  40 mg Subcutaneous Q24H  . fluticasone  2 spray Each Nare Daily  . insulin aspart  0-9 Units Subcutaneous TID WC  . lidocaine (PF)  2 mL Other Once  . loratadine  5 mg Oral Daily  . memantine  5 mg Oral QHS  . metoprolol tartrate  12.5 mg Oral BID  .  mirabegron ER  25 mg Oral Daily  . [START ON 07/11/2017] multivitamins with iron  1 tablet Oral Weekly  . pantoprazole  80 mg Oral Daily  . vitamin B-12  500 mcg Oral Daily   Continuous Infusions: . piperacillin-tazobactam (ZOSYN)  IV 3.375 g (07/10/17 0900)  . [START ON 07/11/2017] vancomycin       LOS: 0 days    Time spent: over 30 minutes    Fayrene Helper, MD [facility] Pager [phone]  If 7PM-7AM, please contact night-coverage [email or web address] Password [email or web address] 07/10/2017, 11:07 AM

## 2017-07-10 NOTE — Progress Notes (Signed)
A consult was received from an ED physician for zosyn and vancomycin per pharmacy dosing.  The patient's profile has been reviewed for ht/wt/allergies/indication/available labs.   A one time order has been placed for Zosyn 3.375 gm and Vancomycin 1500 mg.  Further antibiotics/pharmacy consults should be ordered by admitting physician if indicated.                       Thank you, Dorrene German 07/10/2017  1:55 AM

## 2017-07-10 NOTE — ED Notes (Signed)
Please call floor nurse for report at [phone]. Number [phone]

## 2017-07-10 NOTE — Plan of Care (Signed)
  Progressing Clinical Measurements: Ability to maintain clinical measurements within normal limits will improve 07/10/2017 2357 - Progressing by Talbert Forest, RN Respiratory complications will improve 07/10/2017 2357 - Progressing by Talbert Forest, RN Cardiovascular complication will be avoided 07/10/2017 2357 - Progressing by Talbert Forest, RN Nutrition: Adequate nutrition will be maintained 07/10/2017 2357 - Progressing by Talbert Forest, RN Coping: Level of anxiety will decrease 07/10/2017 2357 - Progressing by Talbert Forest, RN Elimination: Will not experience complications related to urinary retention 07/10/2017 2357 - Progressing by Talbert Forest, RN Pain Managment: General experience of comfort will improve 07/10/2017 2357 - Progressing by Talbert Forest, RN Safety: Ability to remain free from injury will improve 07/10/2017 2357 - Progressing by Talbert Forest, RN

## 2017-07-10 NOTE — Progress Notes (Signed)
Pharmacy Antibiotic Note  Anna [name] is a 83 y.o. female with AMS and fever admitted on 07/09/2017 with osteomyelitis.  Pharmacy has been consulted for zosyn/vancomycin dosing for pylonidal abscess vs osteo of coccyx.   Plan: Zosyn 3.375g IV q8h (4 hour infusion).  Vancomycin 1500 mg x1 then 750 mg IV q24h for est AUC=533 Daily scr F/u cultures/levels  Weight: 178 lb (80.7 kg)  Temp (24hrs), Avg:98.5 F (36.9 C), Min:98.5 F (36.9 C), Max:98.5 F (36.9 C)  Recent Labs  Lab 07/09/17 2241 07/09/17 2352  WBC 12.1*  --   CREATININE 1.07*  --   LATICACIDVEN  --  1.50    Estimated Creatinine Clearance: 39.6 mL/min (A) (by C-G formula based on SCr of 1.07 mg/dL (H)).    Allergies  Allergen Reactions  . Pollen Extract     Sneezing, watery eyes    Antimicrobials this admission: 11/19 zosyn >>  11/19 vancomycin >>   Dose adjustments this admission:   Microbiology results:  BCx:   UCx:    Sputum:    MRSA PCR:   Thank you for allowing pharmacy to be a part of this patient's care.  Dorrene German 07/10/2017 2:56 AM

## 2017-07-10 NOTE — Consult Note (Signed)
[facility] Surgery Consult Note  [facility] 1929/07/31  [id number].    Requesting MD: Fayrene Helper Chief Complaint/Reason for Consult: Abscess of coccyx  HPI:  Anna [name] is an 83yo female PMH Alzheimer's dementia, HTN, DM, Atrial fibrillation, CHF (EF 35-40% (09/16/16)), who was admitted to [facility] last night with 2-3 days of a draining wound over her coccyx. Patient lives at home with her daughter who states that Ms. 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 Most recent injection was on 10/29. Typically they help the pain but this one did not. She had a fever of 101 yesterday, so her daughter brought her to the ED. Upon arrival she was noted to have a WBC 12.1 and temperature of 101. CT scan shows hows small abscess and fistula in the soft tissues to the left of the coccyx with possible focal osteomyelitis in the adjacent coccyx; possible fistula to posterior rectum. The patient's daughter states that she is not more confused, but she is less verbal than normal. Denies chills, nausea, vomiting, abdominal pain, CP, SOB, or diarrhea. She did just finish eating breakfast after 9AM this morning.   ROS: Review of Systems  Constitutional: Positive for chills and fever.  HENT: Negative.   Eyes: Negative.   Respiratory: Negative.   Cardiovascular: Negative.   Gastrointestinal: Negative.   Genitourinary: Negative.        Sacral wound  Musculoskeletal: Negative.   Skin: Negative.   Neurological: Negative.   Psychiatric/Behavioral: Positive for memory loss.    All systems reviewed and otherwise negative except for as above  Family History  Problem Relation Age of Onset  . Heart disease Father   . Diabetes Mother   . Thyroid disease Neg Hx     Past Medical History:  Diagnosis Date  . Alzheimer's disease   . Atrial fibrillation ([place])   . Bronchitis   . CHF (congestive heart failure) ([place])   . Chronic renal insufficiency, stage  III (moderate) ([facility])   . Diabetes mellitus without complication ([place])   . Dyspnea    with exertion  . GERD (gastroesophageal reflux disease)   . GI bleeding 11/2013   due to supratherapeutic INR  . Hyperlipidemia   . Hypertension   . Hyperthyroidism    on amiodarone  . LBBB (left bundle branch block)   . Overactive bladder   . Overweight   . Persistent atrial fibrillation ([place])   . Pneumonia   . Thyroid mass    LLL  . UTI (urinary tract infection)   . Vitamin B12 deficiency   . Vitamin D deficiency     Past Surgical History:  Procedure Laterality Date  . ABDOMINAL HYSTERECTOMY    . ESOPHAGOGASTRODUODENOSCOPY (EGD) WITH PROPOFOL N/A 02/16/2017   Performed by Irene Shipper, MD at [place] History:  reports that  has never smoked. she has never used smokeless tobacco. She reports that she does not drink alcohol or use drugs.  Allergies:  Allergies  Allergen Reactions  . Pollen Extract     Sneezing, watery eyes    Facility-Administered Medications Prior to Admission  Medication Dose Route Frequency Provider Last Rate Last Dose  . lidocaine (PF) (XYLOCAINE) 1 % injection 2 mL  2 mL Other Once Magnus Sinning, MD      . [DISCONTINUED] methylPREDNISolone acetate (DEPO-MEDROL) injection 80 mg  80 mg Other Once Magnus Sinning, MD       Medications Prior to Admission  Medication Sig Dispense Refill  . ACCU-CHEK AVIVA PLUS test strip PATIENT TO CHECK SUGARS ONCE DAILY.  3  . acetaminophen (TYLENOL) 500 MG tablet Take 500 mg by mouth every 6 (six) hours as needed for fever.     Marland Kitchen aspirin 325 MG tablet Take 325 mg by mouth daily.    . calcitRIOL (ROCALTROL) 0.5 MCG capsule Take 1 capsule (0.5 mcg total) by mouth 3 (three) times a week. Monday, Wednesday, and Friday only 36 capsule 3  . carbamide peroxide (DEBROX) 6.5 % OTIC solution Place 5 drops into both ears 2 (two) times daily. (Patient taking differently: Place 5 drops 2 (two) times daily as needed into both  ears (irritation). ) 15 mL 1  . docusate sodium (COLACE) 100 MG capsule Take 100 mg by mouth daily as needed for mild constipation.    Marland Kitchen donepezil (ARICEPT) 10 MG tablet TAKE 1 TABLET ONCE DAILY. 90 tablet 3  . fluticasone (FLONASE) 50 MCG/ACT nasal spray Place 2 sprays into both nostrils daily.    . furosemide (LASIX) 20 MG tablet Take 1.5 tablets (30 mg total) by mouth daily. Take an extra 0.5 tablet daily as needed. 60 tablet 5  . glimepiride (AMARYL) 2 MG tablet TAKE 1 TABLET ONCE DAILY. 90 tablet 3  . Iron-FA-B Cmp-C-Biot-Probiotic (FUSION PLUS) CAPS Take 1 tablet by mouth daily. (Patient taking differently: Take 1 tablet once a week by mouth. ) 90 capsule 3  . loratadine (ALAVERT) 10 MG tablet Take 5 mg daily by mouth.     . memantine (NAMENDA) 5 MG tablet Take 1 tablet (5 mg total) by mouth at bedtime. 30 tablet 3  . metoprolol tartrate (LOPRESSOR) 25 MG tablet Take 12.5 mg 2 (two) times daily by mouth. Take 1/2 tablet by mouth twice a day     . mirabegron ER (MYRBETRIQ) 25 MG TB24 tablet Take 25 mg by mouth daily.    Marland Kitchen omeprazole (PRILOSEC) 40 MG capsule Take 30- 60 min before your first and last meals of the day (Patient taking differently: Take 40 mg daily by mouth. Take 30- 60 min before your first and last meals of the day) 60 capsule 11  . Polyethyl Glycol-Propyl Glycol (SYSTANE) 0.4-0.3 % SOLN Apply 1 drop to eye 2 (two) times daily as needed (dry eyes).    . potassium chloride SA (K-DUR,KLOR-CON) 20 MEQ tablet Take 20 mEq by mouth daily.    . vitamin B-12 (CYANOCOBALAMIN) 500 MCG tablet Take 500 mcg by mouth daily.      Prior to Admission medications   Medication Sig Start Date End Date Taking? Authorizing Provider  ACCU-CHEK AVIVA PLUS test strip PATIENT TO CHECK SUGARS ONCE DAILY. 11/19/15  Yes [provider]  acetaminophen (TYLENOL) 500 MG tablet Take 500 mg by mouth every 6 (six) hours as needed for fever.    Yes [provider]  aspirin 325 MG tablet Take  325 mg by mouth daily.   Yes [provider]  calcitRIOL (ROCALTROL) 0.5 MCG capsule Take 1 capsule (0.5 mcg total) by mouth 3 (three) times a week. Monday, Wednesday, and Friday only 01/06/17  Yes Burchette, Alinda Sierras, MD  carbamide peroxide (DEBROX) 6.5 % OTIC solution Place 5 drops into both ears 2 (two) times daily. Patient taking differently: Place 5 drops 2 (two) times daily as needed into both ears (irritation).  03/22/17  Yes Nafziger, Tommi Rumps, NP  docusate sodium (COLACE) 100 MG capsule Take 100 mg by mouth daily as needed for mild constipation.  Yes [provider]  donepezil (ARICEPT) 10 MG tablet TAKE 1 TABLET ONCE DAILY. 01/17/17  Yes Nafziger, Tommi Rumps, NP  fluticasone (FLONASE) 50 MCG/ACT nasal spray Place 2 sprays into both nostrils daily.   Yes [provider]  furosemide (LASIX) 20 MG tablet Take 1.5 tablets (30 mg total) by mouth daily. Take an extra 0.5 tablet daily as needed. 03/10/17  Yes Skeet Latch, MD  glimepiride (AMARYL) 2 MG tablet TAKE 1 TABLET ONCE DAILY. 04/19/17  Yes Nafziger, Tommi Rumps, NP  Iron-FA-B Cmp-C-Biot-Probiotic (FUSION PLUS) CAPS Take 1 tablet by mouth daily. Patient taking differently: Take 1 tablet once a week by mouth.  01/13/17  Yes Nafziger, Tommi Rumps, NP  loratadine (ALAVERT) 10 MG tablet Take 5 mg daily by mouth.    Yes [provider]  memantine (NAMENDA) 5 MG tablet Take 1 tablet (5 mg total) by mouth at bedtime. 05/26/17  Yes Nafziger, Tommi Rumps, NP  metoprolol tartrate (LOPRESSOR) 25 MG tablet Take 12.5 mg 2 (two) times daily by mouth. Take 1/2 tablet by mouth twice a day    Yes [provider]  mirabegron ER (MYRBETRIQ) 25 MG TB24 tablet Take 25 mg by mouth daily.   Yes [provider]  omeprazole (PRILOSEC) 40 MG capsule Take 30- 60 min before your first and last meals of the day Patient taking differently: Take 40 mg daily by mouth. Take 30- 60 min before your first and last meals of the day 01/05/17  Yes Tanda Rockers, MD  Polyethyl Glycol-Propyl Glycol (SYSTANE) 0.4-0.3 % SOLN Apply 1 drop to eye 2 (two) times daily as needed (dry eyes).   Yes [provider]  potassium chloride SA (K-DUR,KLOR-CON) 20 MEQ tablet Take 20 mEq by mouth daily.   Yes [provider]  vitamin B-12 (CYANOCOBALAMIN) 500 MCG tablet Take 500 mcg by mouth daily.   Yes [provider]    Blood pressure 125/60, pulse 85, temperature (!) 101 F (38.3 C), temperature source Rectal, resp. rate 20, height '5\' 5"'  (1.651 m), weight 170 lb 3.1 oz (77.2 kg), SpO2 98 %. Physical Exam: General: pleasant, WD/WN white female who is laying in bed in NAD but appears very uncomfortable HEENT: head is normocephalic, atraumatic.  Sclera are noninjected.  Pupils equal and round.  Ears and nose without any masses or lesions.  Mouth is pink and moist. Dentition fair Heart: irregularly irregular.  No obvious murmurs, gallops, or rubs noted.  Palpable pedal pulses bilaterally Lungs: CTAB, no wheezes, rhonchi, or rales noted.  Respiratory effort nonlabored Abd: soft, NT/ND, +BS, no masses, hernias, or organomegaly MS: all 4 extremities are symmetrical with no cyanosis, clubbing, or edema. Skin: warm and dry with no masses, lesions, or rashes Psych: alert but nonverbal Neuro: unable to assess cranial nerves, extremity CSM intact bilaterally, normal speech GU: sacral wound about 1cm open with purulent drainage, does not probe deep or tunnel, no fluctuance, mild surrounding erythema     Results for orders placed or performed during the hospital encounter of 07/09/17 (from the past 48 hour(s))  Comprehensive metabolic panel     Status: Abnormal   Collection Time: 07/09/17 10:41 PM  Result Value Ref Range   Sodium 138 135 - 145 mmol/L   Potassium 3.6 3.5 - 5.1 mmol/L   Chloride 105 101 - 111 mmol/L   CO2 23 22 - 32 mmol/L   Glucose, Bld 162 (H) 65 - 99 mg/dL   BUN 18 6 - 20 mg/dL   Creatinine, Ser  1.07 (H) 0.44 - 1.00  mg/dL   Calcium 9.2 8.9 - 10.3 mg/dL   Total Protein 6.4 (L) 6.5 - 8.1 g/dL   Albumin 3.6 3.5 - 5.0 g/dL   AST 16 15 - 41 U/L   ALT 13 (L) 14 - 54 U/L   Alkaline Phosphatase 69 38 - 126 U/L   Total Bilirubin 1.0 0.3 - 1.2 mg/dL   GFR calc non Af Amer 46 (L) >60 mL/min   GFR calc Af Amer 53 (L) >60 mL/min    Comment: (NOTE) The eGFR has been calculated using the CKD EPI equation. This calculation has not been validated in all clinical situations. eGFR's persistently <60 mL/min signify possible Chronic Kidney Disease.    Anion gap 10 5 - 15  CBC     Status: Abnormal   Collection Time: 07/09/17 10:41 PM  Result Value Ref Range   WBC 12.1 (H) 4.0 - 10.5 K/uL   RBC 4.67 3.87 - 5.11 MIL/uL   Hemoglobin 14.8 12.0 - 15.0 g/dL   HCT 44.3 36.0 - 46.0 %   MCV 94.9 78.0 - 100.0 fL   MCH 31.7 26.0 - 34.0 pg   MCHC 33.4 30.0 - 36.0 g/dL   RDW 14.2 11.5 - 15.5 %   Platelets 133 (L) 150 - 400 K/uL  Differential     Status: Abnormal   Collection Time: 07/09/17 10:41 PM  Result Value Ref Range   Neutrophils Relative % 83 %   Neutro Abs 9.1 (H) 1.7 - 7.7 K/uL   Lymphocytes Relative 9 %   Lymphs Abs 1.0 0.7 - 4.0 K/uL   Monocytes Relative 8 %   Monocytes Absolute 0.9 0.1 - 1.0 K/uL   Eosinophils Relative 0 %   Eosinophils Absolute 0.0 0.0 - 0.7 K/uL   Basophils Relative 0 %   Basophils Absolute 0.0 0.0 - 0.1 K/uL  CBG monitoring, ED     Status: Abnormal   Collection Time: 07/09/17 10:47 PM  Result Value Ref Range   Glucose-Capillary 145 (H) 65 - 99 mg/dL   Comment 1 Notify RN   Urinalysis, Routine w reflex microscopic     Status: Abnormal   Collection Time: 07/09/17 11:08 PM  Result Value Ref Range   Color, Urine YELLOW YELLOW   APPearance CLEAR CLEAR   Specific Gravity, Urine 1.017 1.005 - 1.030   pH 5.0 5.0 - 8.0   Glucose, UA NEGATIVE NEGATIVE mg/dL   Hgb urine dipstick SMALL (A) NEGATIVE   Bilirubin Urine NEGATIVE NEGATIVE   Ketones, ur 5 (A) NEGATIVE mg/dL   Protein, ur  NEGATIVE NEGATIVE mg/dL   Nitrite NEGATIVE NEGATIVE   Leukocytes, UA TRACE (A) NEGATIVE   RBC / HPF 0-5 0 - 5 RBC/hpf   WBC, UA 0-5 0 - 5 WBC/hpf   Bacteria, UA MANY (A) NONE SEEN   Squamous Epithelial / LPF NONE SEEN NONE SEEN  Wound or Superficial Culture     Status: None (Preliminary result)   Collection Time: 07/09/17 11:29 PM  Result Value Ref Range   Specimen Description SACRAL    Special Requests NONE    Gram Stain      RARE WBC PRESENT, PREDOMINANTLY MONONUCLEAR ABUNDANT GRAM POSITIVE COCCI IN PAIRS MODERATE GRAM NEGATIVE RODS Performed at [facility], [address] [place], [place] [address]    Culture PENDING    Report Status PENDING   I-Stat CG4 Lactic Acid, ED     Status: None   Collection Time:  07/09/17 11:52 PM  Result Value Ref Range   Lactic Acid, Venous 1.50 0.5 - 1.9 mmol/L  Glucose, capillary     Status: Abnormal   Collection Time: 07/10/17  5:33 AM  Result Value Ref Range   Glucose-Capillary 114 (H) 65 - 99 mg/dL  Glucose, capillary     Status: Abnormal   Collection Time: 07/10/17  8:13 AM  Result Value Ref Range   Glucose-Capillary 123 (H) 65 - 99 mg/dL   Ct Pelvis Wo Contrast  Result Date: 07/10/2017 CLINICAL DATA:  Fever, coccygeal pain with draining wound s/p steroid injection 3 weeks, wbc's 12.1, hx of hysterectomy, x-ray sacrum/ coccyx, no prev ct's of pelvis Difficult for patient to follow instructions due to dementia EXAM: CT PELVIS WITHOUT CONTRAST TECHNIQUE: Multidetector CT imaging of the pelvis was performed following the standard protocol without intravenous contrast. Examination is technically limited due to motion artifact. COMPARISON:  None. FINDINGS: Urinary Tract: Bladder wall is not thickened and no bladder filling defects are identified. Distal ureters are decompressed. Bowel: The visualized portions of the colon and small bowel are not abnormally distended. Stool fills the rectosigmoid colon. Scattered diverticula without evidence  of diverticulitis. Vascular/Lymphatic: Calcifications in the iliac and external iliac artery's. No aneurysm. Reproductive:  Uterus and ovaries are not enlarged. Other: Small skin defect to the left of the coccyx with underlying gas and soft tissue density measuring 3 cm in diameter. This is consistent with small abscess and fistula. There is suggestion of some bone loss in the adjacent coccyx which may indicate osteomyelitis. Focal loss of presacral fat plane could represent a fistula to the posterior rectum. Musculoskeletal: Degenerative changes in the lower lumbar spine. No expansile bone lesions are appreciated. IMPRESSION: Small abscess and fistula in the soft tissues to the left of the coccyx with possible focal osteomyelitis in the adjacent coccyx. Focal loss of presacral fat plane could represent a fistula to the posterior rectum. Electronically Signed   By: Lucienne Capers M.D.   On: 07/10/2017 02:38   Dg Chest Port 1 View  Result Date: 07/10/2017 CLINICAL DATA:  Initial evaluation for acute fever. EXAM: PORTABLE CHEST 1 VIEW COMPARISON:  Prior radiograph from 03/09/2017. FINDINGS: Moderate cardiomegaly, stable. Mediastinal silhouette within normal limits. Aortic atherosclerosis. Lungs are hypoinflated. Mild diffuse vascular congestion with interstitial prominence, suggesting mild pulmonary interstitial edema. Superimposed left basilar atelectasis and/ or scarring, similar to previous. Suspected small bilateral pleural effusions. No consolidative opacity. No pneumothorax. No acute osseus abnormality. Osteopenia. Degenerative changes noted about the right shoulder. IMPRESSION: 1. Cardiomegaly with mild diffuse pulmonary vascular congestion with interstitial prominence, suggesting mild diffuse pulmonary interstitial edema. 2. Suspected small bilateral pleural effusions. 3. Aortic atherosclerosis. Electronically Signed   By: Jeannine Boga M.D.   On: 07/10/2017 00:14   Anti-infectives (From  admission, onward)   Start     Dose/Rate Route Frequency Ordered Stop   07/11/17 0400  vancomycin (VANCOCIN) IVPB 750 mg/150 ml premix     750 mg 150 mL/hr over 60 Minutes Intravenous Every 24 hours 07/10/17 0318     07/10/17 0800  piperacillin-tazobactam (ZOSYN) IVPB 3.375 g     3.375 g 12.5 mL/hr over 240 Minutes Intravenous Every 8 hours 07/10/17 0318     07/10/17 0200  piperacillin-tazobactam (ZOSYN) IVPB 3.375 g  Status:  Discontinued     3.375 g 100 mL/hr over 30 Minutes Intravenous  Once 07/10/17 0145 07/10/17 0146   07/10/17 0200  vancomycin (VANCOCIN) IVPB 1000 mg/200 mL premix  Status:  Discontinued     1,000 mg 200 mL/hr over 60 Minutes Intravenous  Once 07/10/17 0145 07/10/17 0146   07/10/17 0200  vancomycin (VANCOCIN) 1,500 mg in sodium chloride 0.9 % 500 mL IVPB     1,500 mg 250 mL/hr over 120 Minutes Intravenous  Once 07/10/17 0150 07/10/17 0400   07/10/17 0200  piperacillin-tazobactam (ZOSYN) IVPB 3.375 g     3.375 g 100 mL/hr over 30 Minutes Intravenous  Once 07/10/17 0151 07/10/17 0326        Assessment/Plan Alzheimer's dementia HTN DM Atrial fibrillation CHF - EF 35-40% (09/16/16)  Sacral wound Abscess of coccyx - recent h/o steroid injection 10/29 at coccyx for coccygodynia; draining wound over coccyx noticed 2-3 days ago and fevers yesterday - CT 11/19 shows small abscess and fistula in the soft tissues to the left of the coccyx with possible focal osteomyelitis in the adjacent coccyx; possible fistula to posterior rectum - WBC 12.1, TMAX 101  ID - zosyn 11/19>>, vancomycin 11/19>>. Bcx pending VTE - SCDs, hold lovenox for procdure FEN - IVF, NPO for procedure Foley - none Follow up - TBD  Plan - I saw and evaluated this patient with Dr. Excell Seltzer. Recommend debridement of sacral wound in the operating room. She did just eat breakfast, so I will make her NPO for now and we will plan for surgery later this afternoon versus tomorrow. Continue broad  spectrum antibiotics.   Wellington Hampshire, [facility] Surgery 07/10/2017, 9:23 AM Pager: [phone] Consults: [phone] Mon-Fri 7:00 am-4:30 pm Sat-Sun 7:00 am-11:30 am

## 2017-07-10 NOTE — H&P (Signed)
History and Physical    Anna [name] GLO:[id number] DOB: [birth year] DOA: 07/09/2017  PCP: Dorothyann Peng, NP  Patient coming from: Home  I have personally briefly reviewed patient's old medical records in [place]  Chief Complaint: AMS and fever  HPI: Anna [name] is a 83 y.o. female with medical history significant of Alzheimer's dementia.  Patient has had 2 shots in sacrum last month for coccygodynia, last on 10/29.  Over the past 2-3 days has developed small draining wound in coccygeal area.  Drainage is purulent and foul smelling.  Today Fever 101.1, improved with tylenol.  Generalized weakness more than usual and non-verbal today though she is minimally verbal at baseline.  Despite this daughter states she did take all PO meds today, was eating and drinking normally (had 1200 cc PO fluid intake over day today which is her recommended limit).   ED Course: Code sepsis, got 2L NS IVF bolus, got zosyn and vanc.  CT scan shows abscess / osteomyelitis of coccyx.   Review of Systems: Unable to perform, patient non-verbal right now.  Past Medical History:  Diagnosis Date  . Alzheimer's disease   . Atrial fibrillation ([place])   . Bronchitis   . CHF (congestive heart failure) ([place])   . Chronic renal insufficiency, stage III (moderate) (HCC)   . Diabetes mellitus without complication ([place])   . Dyspnea    with exertion  . GERD (gastroesophageal reflux disease)   . GI bleeding 11/2013   due to supratherapeutic INR  . Hyperlipidemia   . Hypertension   . Hyperthyroidism    on amiodarone  . LBBB (left bundle branch block)   . Overactive bladder   . Overweight   . Persistent atrial fibrillation ([place])   . Pneumonia   . Thyroid mass    LLL  . UTI (urinary tract infection)   . Vitamin B12 deficiency   . Vitamin D deficiency     Past Surgical History:  Procedure Laterality Date  . ABDOMINAL HYSTERECTOMY    . ESOPHAGOGASTRODUODENOSCOPY (EGD) WITH PROPOFOL N/A  02/16/2017   Performed by Irene Shipper, MD at [facility]     reports that  has never smoked. she has never used smokeless tobacco. She reports that she does not drink alcohol or use drugs.  Allergies  Allergen Reactions  . Pollen Extract     Sneezing, watery eyes    Family History  Problem Relation Age of Onset  . Heart disease Father   . Diabetes Mother   . Thyroid disease Neg Hx      Prior to Admission medications   Medication Sig Start Date End Date Taking? Authorizing Provider  ACCU-CHEK AVIVA PLUS test strip PATIENT TO CHECK SUGARS ONCE DAILY. 11/19/15  Yes [provider]  acetaminophen (TYLENOL) 500 MG tablet Take 500 mg by mouth every 6 (six) hours as needed for fever.    Yes [provider]  aspirin 325 MG tablet Take 325 mg by mouth daily.   Yes [provider]  calcitRIOL (ROCALTROL) 0.5 MCG capsule Take 1 capsule (0.5 mcg total) by mouth 3 (three) times a week. Monday, Wednesday, and Friday only 01/06/17  Yes Burchette, Alinda Sierras, MD  carbamide peroxide (DEBROX) 6.5 % OTIC solution Place 5 drops into both ears 2 (two) times daily. Patient taking differently: Place 5 drops 2 (two) times daily as needed into both ears (irritation).  03/22/17  Yes Nafziger, Tommi Rumps, NP  docusate sodium (COLACE) 100 MG capsule Take  100 mg by mouth daily as needed for mild constipation.   Yes [provider]  donepezil (ARICEPT) 10 MG tablet TAKE 1 TABLET ONCE DAILY. 01/17/17  Yes Nafziger, Tommi Rumps, NP  fluticasone (FLONASE) 50 MCG/ACT nasal spray Place 2 sprays into both nostrils daily.   Yes [provider]  furosemide (LASIX) 20 MG tablet Take 1.5 tablets (30 mg total) by mouth daily. Take an extra 0.5 tablet daily as needed. 03/10/17  Yes Skeet Latch, MD  glimepiride (AMARYL) 2 MG tablet TAKE 1 TABLET ONCE DAILY. 04/19/17  Yes Nafziger, Tommi Rumps, NP  Iron-FA-B Cmp-C-Biot-Probiotic (FUSION PLUS) CAPS Take 1 tablet by mouth daily. Patient taking  differently: Take 1 tablet once a week by mouth.  01/13/17  Yes Nafziger, Tommi Rumps, NP  loratadine (ALAVERT) 10 MG tablet Take 5 mg daily by mouth.    Yes [provider]  memantine (NAMENDA) 5 MG tablet Take 1 tablet (5 mg total) by mouth at bedtime. 05/26/17  Yes Nafziger, Tommi Rumps, NP  metoprolol tartrate (LOPRESSOR) 25 MG tablet Take 12.5 mg 2 (two) times daily by mouth. Take 1/2 tablet by mouth twice a day    Yes [provider]  mirabegron ER (MYRBETRIQ) 25 MG TB24 tablet Take 25 mg by mouth daily.   Yes [provider]  omeprazole (PRILOSEC) 40 MG capsule Take 30- 60 min before your first and last meals of the day Patient taking differently: Take 40 mg daily by mouth. Take 30- 60 min before your first and last meals of the day 01/05/17  Yes Tanda Rockers, MD  Polyethyl Glycol-Propyl Glycol (SYSTANE) 0.4-0.3 % SOLN Apply 1 drop to eye 2 (two) times daily as needed (dry eyes).   Yes [provider]  potassium chloride SA (K-DUR,KLOR-CON) 20 MEQ tablet Take 20 mEq by mouth daily.   Yes [provider]  vitamin B-12 (CYANOCOBALAMIN) 500 MCG tablet Take 500 mcg by mouth daily.   Yes [provider]    Physical Exam: Vitals:   07/09/17 2300 07/09/17 2330 07/10/17 0000 07/10/17 0149  BP: 127/64 140/71 116/73   Pulse: 83  80   Resp: 20 (!) 27    Temp:      TempSrc:      SpO2: 96%  97%   Weight:    80.7 kg (178 lb)    Constitutional: Sleepy, arousable, opens eyes then falls back asleep Eyes: PERRL, lids and conjunctivae normal ENMT: Mucous membranes are moist. Posterior pharynx clear of any exudate or lesions.Normal dentition.  Neck: normal, supple, no masses, no thyromegaly Respiratory: clear to auscultation bilaterally, no wheezing, no crackles. Normal respiratory effort. No accessory muscle use.  Cardiovascular: Regular rate and rhythm, no murmurs / rubs / gallops. No extremity edema. 2+ pedal pulses. No carotid bruits.  Abdomen: no  tenderness, no masses palpated. No hepatosplenomegaly. Bowel sounds positive.  Musculoskeletal: no clubbing / cyanosis. No joint deformity upper and lower extremities. Good ROM, no contractures. Normal muscle tone.  Skin: Draining wound of coccyx, no surrounding erythema. Neurologic: opens eyes then falls back asleep, MAE Psychiatric: Non-verbal   Labs on Admission: I have personally reviewed following labs and imaging studies  CBC: Recent Labs  Lab 07/09/17 2241  WBC 12.1*  HGB 14.8  HCT 44.3  MCV 94.9  PLT 161*   Basic Metabolic Panel: Recent Labs  Lab 07/09/17 2241  NA 138  K 3.6  CL 105  CO2 23  GLUCOSE 162*  BUN 18  CREATININE 1.07*  CALCIUM 9.2  GFR: Estimated Creatinine Clearance: 39.6 mL/min (A) (by C-G formula based on SCr of 1.07 mg/dL (H)). Liver Function Tests: Recent Labs  Lab 07/09/17 2241  AST 16  ALT 13*  ALKPHOS 69  BILITOT 1.0  PROT 6.4*  ALBUMIN 3.6   No results for input(s): LIPASE, AMYLASE in the last 168 hours. No results for input(s): AMMONIA in the last 168 hours. Coagulation Profile: No results for input(s): INR, PROTIME in the last 168 hours. Cardiac Enzymes: No results for input(s): CKTOTAL, CKMB, CKMBINDEX, TROPONINI in the last 168 hours. BNP (last 3 results) No results for input(s): PROBNP in the last 8760 hours. HbA1C: No results for input(s): HGBA1C in the last 72 hours. CBG: Recent Labs  Lab 07/09/17 2247  GLUCAP 145*   Lipid Profile: No results for input(s): CHOL, HDL, LDLCALC, TRIG, CHOLHDL, LDLDIRECT in the last 72 hours. Thyroid Function Tests: No results for input(s): TSH, T4TOTAL, FREET4, T3FREE, THYROIDAB in the last 72 hours. Anemia Panel: No results for input(s): VITAMINB12, FOLATE, FERRITIN, TIBC, IRON, RETICCTPCT in the last 72 hours. Urine analysis:    Component Value Date/Time   COLORURINE YELLOW 07/09/2017 2308   APPEARANCEUR CLEAR 07/09/2017 2308   LABSPEC 1.017 07/09/2017 2308   PHURINE 5.0  07/09/2017 2308   GLUCOSEU NEGATIVE 07/09/2017 2308   HGBUR SMALL (A) 07/09/2017 2308   BILIRUBINUR NEGATIVE 07/09/2017 2308   BILIRUBINUR Negative 02/11/2017 1234   KETONESUR 5 (A) 07/09/2017 2308   PROTEINUR NEGATIVE 07/09/2017 2308   UROBILINOGEN 0.2 02/11/2017 1234   UROBILINOGEN 0.2 01/27/2015 1353   NITRITE NEGATIVE 07/09/2017 2308   LEUKOCYTESUR TRACE (A) 07/09/2017 2308    Radiological Exams on Admission: Ct Pelvis Wo Contrast  Result Date: 07/10/2017 CLINICAL DATA:  Fever, coccygeal pain with draining wound s/p steroid injection 3 weeks, wbc's 12.1, hx of hysterectomy, x-ray sacrum/ coccyx, no prev ct's of pelvis Difficult for patient to follow instructions due to dementia EXAM: CT PELVIS WITHOUT CONTRAST TECHNIQUE: Multidetector CT imaging of the pelvis was performed following the standard protocol without intravenous contrast. Examination is technically limited due to motion artifact. COMPARISON:  None. FINDINGS: Urinary Tract: Bladder wall is not thickened and no bladder filling defects are identified. Distal ureters are decompressed. Bowel: The visualized portions of the colon and small bowel are not abnormally distended. Stool fills the rectosigmoid colon. Scattered diverticula without evidence of diverticulitis. Vascular/Lymphatic: Calcifications in the iliac and external iliac artery's. No aneurysm. Reproductive:  Uterus and ovaries are not enlarged. Other: Small skin defect to the left of the coccyx with underlying gas and soft tissue density measuring 3 cm in diameter. This is consistent with small abscess and fistula. There is suggestion of some bone loss in the adjacent coccyx which may indicate osteomyelitis. Focal loss of presacral fat plane could represent a fistula to the posterior rectum. Musculoskeletal: Degenerative changes in the lower lumbar spine. No expansile bone lesions are appreciated. IMPRESSION: Small abscess and fistula in the soft tissues to the left of the  coccyx with possible focal osteomyelitis in the adjacent coccyx. Focal loss of presacral fat plane could represent a fistula to the posterior rectum. Electronically Signed   By: Lucienne Capers M.D.   On: 07/10/2017 02:38   Dg Chest Port 1 View  Result Date: 07/10/2017 CLINICAL DATA:  Initial evaluation for acute fever. EXAM: PORTABLE CHEST 1 VIEW COMPARISON:  Prior radiograph from 03/09/2017. FINDINGS: Moderate cardiomegaly, stable. Mediastinal silhouette within normal limits. Aortic atherosclerosis. Lungs are hypoinflated. Mild diffuse vascular congestion with interstitial  prominence, suggesting mild pulmonary interstitial edema. Superimposed left basilar atelectasis and/ or scarring, similar to previous. Suspected small bilateral pleural effusions. No consolidative opacity. No pneumothorax. No acute osseus abnormality. Osteopenia. Degenerative changes noted about the right shoulder. IMPRESSION: 1. Cardiomegaly with mild diffuse pulmonary vascular congestion with interstitial prominence, suggesting mild diffuse pulmonary interstitial edema. 2. Suspected small bilateral pleural effusions. 3. Aortic atherosclerosis. Electronically Signed   By: Jeannine Boga M.D.   On: 07/10/2017 00:14    EKG: Independently reviewed.  Assessment/Plan Principal Problem:   Abscess of coccyx Active Problems:   Benign essential HTN   DM (diabetes mellitus) type II controlled with renal manifestation (HCC)   Acute metabolic encephalopathy   Alzheimer's dementia with behavioral disturbance   Pilonidal abscess    1. Abscess / osteo of coccyx - 1. Zosyn and vanc 2. Cultures pending 3. IVF: got 2L, will hold off on further for now pending PO status in AM 4. Call surgery (General surg vs ortho surg) in AM 2. Acute metabolic encephalopathy - 1. Delirium due to #1 above 2. Daughter insists that patient is just sleepy right now, and will be able to wake up and take POs in AM, was eating normally today per  daughter 3. Will go ahead and leave on PO meds and diet order and see if she does indeed do this 4. If not she will need to be put on maint fluids. 3. Dementia - continue home meds 4. HTN - continue metoprolol 5. DM2 - 1. Hold home meds 2. Sensitive SSI AC for now  DVT prophylaxis: Lovenox Code Status: Full Family Communication: Daughter at bedside Disposition Plan: Home after admit Consults called: None Admission status: Admit to inpatient   [place], [place] Pager [phone]  If 7AM-7PM, please contact day team taking care of patient [email or web address] Password [email or web address]  07/10/2017, 3:47 AM

## 2017-07-10 NOTE — Consult Note (Signed)
[facility] Nurse wound consult note Reason for Consult:coccyxgeal wound/abscess Wound type: infectious CCS has been simultaneously consulted and there is a plan for Dr. Excell Seltzer to take this patient to the OR tomorrow for wound exploration and debridement and partial removal of her coccyx if it appears to be infected.Marland Kitchen CCS will provide orders for wound care following that operative procedure. [facility] nursing team will not follow, but will remain available to this patient, the nursing and medical teams.  Please re-consult if needed. Thanks, Maudie Flakes, MSN, RN, [place], Arther Abbott  Pager# [phone]

## 2017-07-11 ENCOUNTER — Inpatient Hospital Stay (HOSPITAL_COMMUNITY): Payer: Medicare HMO | Admitting: Anesthesiology

## 2017-07-11 ENCOUNTER — Encounter (HOSPITAL_COMMUNITY)
Admission: EM | Disposition: A | Discharge status: Home Health | Payer: Self-pay | Source: Home / Self Care | Attending: Internal Medicine

## 2017-07-11 HISTORY — PX: IRRIGATION AND DEBRIDEMENT ABSCESS: SHX5252

## 2017-07-11 LAB — GLUCOSE, CAPILLARY
GLUCOSE-CAPILLARY: 120 mg/dL — AB (ref 65–99)
Glucose-Capillary: 112 mg/dL — ABNORMAL HIGH (ref 65–99)
Glucose-Capillary: 178 mg/dL — ABNORMAL HIGH (ref 65–99)

## 2017-07-11 LAB — BASIC METABOLIC PANEL
ANION GAP: 7 (ref 5–15)
BUN: 16 mg/dL (ref 6–20)
CHLORIDE: 108 mmol/L (ref 101–111)
CO2: 26 mmol/L (ref 22–32)
Calcium: 9 mg/dL (ref 8.9–10.3)
Creatinine, Ser: 1.15 mg/dL — ABNORMAL HIGH (ref 0.44–1.00)
GFR, EST AFRICAN AMERICAN: 48 mL/min — AB (ref >60)
GFR, EST NON AFRICAN AMERICAN: 42 mL/min — AB (ref >60)
Glucose, Bld: 138 mg/dL — ABNORMAL HIGH (ref 65–99)
POTASSIUM: 3.6 mmol/L (ref 3.5–5.1)
SODIUM: 141 mmol/L (ref 135–145)

## 2017-07-11 LAB — CBC
HCT: 40.2 % (ref 36.0–46.0)
HEMOGLOBIN: 13.2 g/dL (ref 12.0–15.0)
MCH: 31.3 pg (ref 26.0–34.0)
MCHC: 32.8 g/dL (ref 30.0–36.0)
MCV: 95.3 fL (ref 78.0–100.0)
PLATELETS: 102 10*3/uL — AB (ref 150–400)
RBC: 4.22 MIL/uL (ref 3.87–5.11)
RDW: 14.2 % (ref 11.5–15.5)
WBC: 10.1 10*3/uL (ref 4.0–10.5)

## 2017-07-11 LAB — SURGICAL PCR SCREEN
MRSA, PCR: NEGATIVE
STAPHYLOCOCCUS AUREUS: NEGATIVE

## 2017-07-11 SURGERY — IRRIGATION AND DEBRIDEMENT ABSCESS
Anesthesia: General | Site: Back

## 2017-07-11 MED ORDER — ONDANSETRON HCL 4 MG/2ML IJ SOLN
INTRAMUSCULAR | Status: DC | PRN
  Administered 2017-07-11: 4 mg via INTRAVENOUS

## 2017-07-11 MED ORDER — CEFEPIME HCL 1 G IJ SOLR
1.0000 g | Freq: Two times a day (BID) | INTRAMUSCULAR | Status: DC
  Administered 2017-07-11 – 2017-07-14 (×6): 1 g via INTRAVENOUS
  Filled 2017-07-11 (×10): qty 1

## 2017-07-11 MED ORDER — FENTANYL CITRATE (PF) 100 MCG/2ML IJ SOLN
INTRAMUSCULAR | Status: AC
  Filled 2017-07-11: qty 2

## 2017-07-11 MED ORDER — FUROSEMIDE 20 MG PO TABS
30.0000 mg | ORAL_TABLET | Freq: Every day | ORAL | Status: DC
  Administered 2017-07-12 – 2017-07-17 (×6): 30 mg via ORAL
  Filled 2017-07-11 (×6): qty 2

## 2017-07-11 MED ORDER — FENTANYL CITRATE (PF) 100 MCG/2ML IJ SOLN
12.5000 ug | INTRAMUSCULAR | Status: DC | PRN
  Administered 2017-07-11 – 2017-07-14 (×10): 12.5 ug via INTRAVENOUS
  Filled 2017-07-11 (×10): qty 2

## 2017-07-11 MED ORDER — ONDANSETRON HCL 4 MG/2ML IJ SOLN
INTRAMUSCULAR | Status: AC
  Filled 2017-07-11: qty 2

## 2017-07-11 MED ORDER — FUROSEMIDE 20 MG PO TABS
30.0000 mg | ORAL_TABLET | Freq: Every day | ORAL | Status: DC
Start: 2017-07-11 — End: 2017-07-11

## 2017-07-11 MED ORDER — FENTANYL CITRATE (PF) 100 MCG/2ML IJ SOLN
INTRAMUSCULAR | Status: DC | PRN
  Administered 2017-07-11 (×2): 25 ug via INTRAVENOUS

## 2017-07-11 MED ORDER — FENTANYL CITRATE (PF) 100 MCG/2ML IJ SOLN
25.0000 ug | INTRAMUSCULAR | Status: DC | PRN
  Administered 2017-07-11: 25 ug via INTRAVENOUS

## 2017-07-11 MED ORDER — LIDOCAINE 2% (20 MG/ML) 5 ML SYRINGE
INTRAMUSCULAR | Status: DC | PRN
  Administered 2017-07-11: 40 mg via INTRAVENOUS

## 2017-07-11 MED ORDER — ROCURONIUM BROMIDE 50 MG/5ML IV SOSY
PREFILLED_SYRINGE | INTRAVENOUS | Status: AC
  Filled 2017-07-11: qty 5

## 2017-07-11 MED ORDER — METRONIDAZOLE IN NACL 5-0.79 MG/ML-% IV SOLN
500.0000 mg | Freq: Three times a day (TID) | INTRAVENOUS | Status: DC
  Administered 2017-07-11 – 2017-07-14 (×10): 500 mg via INTRAVENOUS
  Filled 2017-07-11 (×12): qty 100

## 2017-07-11 MED ORDER — CHLORHEXIDINE GLUCONATE CLOTH 2 % EX PADS
6.0000 | MEDICATED_PAD | Freq: Once | CUTANEOUS | Status: AC
  Administered 2017-07-11: 6 via TOPICAL

## 2017-07-11 MED ORDER — PROMETHAZINE HCL 25 MG/ML IJ SOLN: 6.2500 mg | INTRAMUSCULAR | Status: DC | PRN

## 2017-07-11 MED ORDER — ROCURONIUM BROMIDE 10 MG/ML (PF) SYRINGE
PREFILLED_SYRINGE | INTRAVENOUS | Status: DC | PRN
  Administered 2017-07-11: 10 mg via INTRAVENOUS
  Administered 2017-07-11: 25 mg via INTRAVENOUS

## 2017-07-11 MED ORDER — 0.9 % SODIUM CHLORIDE (POUR BTL) OPTIME
TOPICAL | Status: DC | PRN
  Administered 2017-07-11: 1000 mL

## 2017-07-11 MED ORDER — LACTATED RINGERS IV SOLN
INTRAVENOUS | Status: DC | PRN
  Administered 2017-07-11: 12:00:00 via INTRAVENOUS

## 2017-07-11 MED ORDER — CHLORHEXIDINE GLUCONATE CLOTH 2 % EX PADS
6.0000 | MEDICATED_PAD | Freq: Once | CUTANEOUS | Status: AC
  Administered 2017-07-10: 6 via TOPICAL

## 2017-07-11 MED ORDER — PROPOFOL 10 MG/ML IV BOLUS
INTRAVENOUS | Status: DC | PRN
  Administered 2017-07-11: 80 mg via INTRAVENOUS

## 2017-07-11 MED ORDER — PHENYLEPHRINE 40 MCG/ML (10ML) SYRINGE FOR IV PUSH (FOR BLOOD PRESSURE SUPPORT)
PREFILLED_SYRINGE | INTRAVENOUS | Status: DC | PRN
  Administered 2017-07-11 (×2): 80 ug via INTRAVENOUS

## 2017-07-11 MED ORDER — SODIUM CHLORIDE 0.9 % IV SOLN
1250.0000 mg | INTRAVENOUS | Status: DC
  Administered 2017-07-13: 1250 mg via INTRAVENOUS
  Filled 2017-07-11: qty 1250

## 2017-07-11 MED ORDER — ESMOLOL HCL 100 MG/10ML IV SOLN
INTRAVENOUS | Status: DC | PRN
  Administered 2017-07-11: 20 mg via INTRAVENOUS
  Administered 2017-07-11: 10 mg via INTRAVENOUS

## 2017-07-11 MED ORDER — FENTANYL CITRATE (PF) 100 MCG/2ML IJ SOLN
INTRAMUSCULAR | Status: AC
  Administered 2017-07-11: 25 ug via INTRAVENOUS
  Filled 2017-07-11: qty 2

## 2017-07-11 MED ORDER — ONDANSETRON HCL 4 MG/2ML IJ SOLN
4.0000 mg | Freq: Once | INTRAMUSCULAR | Status: AC
  Administered 2017-07-11: 4 mg via INTRAVENOUS

## 2017-07-11 MED ORDER — LIDOCAINE 2% (20 MG/ML) 5 ML SYRINGE
INTRAMUSCULAR | Status: AC
Start: 2017-07-11 — End: 2017-07-11
  Filled 2017-07-11: qty 5

## 2017-07-11 MED ORDER — SUGAMMADEX SODIUM 200 MG/2ML IV SOLN
INTRAVENOUS | Status: DC | PRN
  Administered 2017-07-11: 150 mg via INTRAVENOUS

## 2017-07-11 MED ORDER — MEPERIDINE HCL 50 MG/ML IJ SOLN: 6.2500 mg | INTRAMUSCULAR | Status: DC | PRN

## 2017-07-11 MED ORDER — ACETAMINOPHEN 10 MG/ML IV SOLN: 1000.0000 mg | Freq: Once | INTRAVENOUS | Status: DC | PRN

## 2017-07-11 MED ORDER — PROPOFOL 10 MG/ML IV BOLUS
INTRAVENOUS | Status: AC
Start: 2017-07-11 — End: 2017-07-11
  Filled 2017-07-11: qty 20

## 2017-07-11 SURGICAL SUPPLY — 37 items
BENZOIN TINCTURE PRP APPL 2/3 (GAUZE/BANDAGES/DRESSINGS) IMPLANT
BLADE HEX COATED 2.75 (ELECTRODE) ×3 IMPLANT
BLADE SURG SZ10 CARB STEEL (BLADE) ×6 IMPLANT
DECANTER SPIKE VIAL GLASS SM (MISCELLANEOUS) IMPLANT
DRAPE LAPAROTOMY T 102X78X121 (DRAPES) IMPLANT
DRAPE LAPAROTOMY TRNSV 102X78 (DRAPE) ×3 IMPLANT
DRAPE SHEET LG 3/4 BI-LAMINATE (DRAPES) IMPLANT
ELECT REM PT RETURN 15FT ADLT (MISCELLANEOUS) ×3 IMPLANT
EVACUATOR SILICONE 100CC (DRAIN) IMPLANT
GAUZE IODOFORM PACK 1/2 7832 (GAUZE/BANDAGES/DRESSINGS) ×3 IMPLANT
GAUZE SPONGE 4X4 12PLY STRL (GAUZE/BANDAGES/DRESSINGS) ×3 IMPLANT
GLOVE BIO SURGEON STRL SZ7.5 (GLOVE) ×6 IMPLANT
GLOVE BIOGEL PI IND STRL 7.0 (GLOVE) ×1 IMPLANT
GLOVE BIOGEL PI INDICATOR 7.0 (GLOVE) ×2
GOWN STRL REUS W/ TWL XL LVL3 (GOWN DISPOSABLE) ×1 IMPLANT
GOWN STRL REUS W/TWL LRG LVL3 (GOWN DISPOSABLE) ×3 IMPLANT
GOWN STRL REUS W/TWL XL LVL3 (GOWN DISPOSABLE) ×5 IMPLANT
KIT BASIN OR (CUSTOM PROCEDURE TRAY) ×3 IMPLANT
MARKER SKIN DUAL TIP RULER LAB (MISCELLANEOUS) IMPLANT
NEEDLE HYPO 25X1 1.5 SAFETY (NEEDLE) IMPLANT
NS IRRIG 1000ML POUR BTL (IV SOLUTION) ×3 IMPLANT
PACK BASIC VI WITH GOWN DISP (CUSTOM PROCEDURE TRAY) ×3 IMPLANT
PACK GENERAL/GYN (CUSTOM PROCEDURE TRAY) ×3 IMPLANT
PAD ABD 8X10 STRL (GAUZE/BANDAGES/DRESSINGS) ×3 IMPLANT
SOL PREP POV-IOD 4OZ 10% (MISCELLANEOUS) ×3 IMPLANT
SPONGE LAP 18X18 X RAY DECT (DISPOSABLE) IMPLANT
SPONGE LAP 4X18 X RAY DECT (DISPOSABLE) IMPLANT
STAPLER VISISTAT 35W (STAPLE) IMPLANT
SUT MNCRL AB 4-0 PS2 18 (SUTURE) IMPLANT
SUT VIC AB 3-0 SH 18 (SUTURE) IMPLANT
SWAB COLLECTION DEVICE MRSA (MISCELLANEOUS) ×6 IMPLANT
SWAB CULTURE ESWAB REG 1ML (MISCELLANEOUS) ×6 IMPLANT
SYR CONTROL 10ML LL (SYRINGE) ×3 IMPLANT
TAPE CLOTH SURG 4X10 WHT LF (GAUZE/BANDAGES/DRESSINGS) ×3 IMPLANT
TOWEL OR 17X26 10 PK STRL BLUE (TOWEL DISPOSABLE) ×3 IMPLANT
WATER STERILE IRR 1000ML POUR (IV SOLUTION) ×3 IMPLANT
YANKAUER SUCT BULB TIP 10FT TU (MISCELLANEOUS) IMPLANT

## 2017-07-11 NOTE — Progress Notes (Signed)
Pt left floor for OR w/ OR tech at this time. Dtr to waiting room.

## 2017-07-11 NOTE — Anesthesia Postprocedure Evaluation (Addendum)
Anesthesia Post Note  Patient: Anna [name]  Procedure(s) Performed: IRRIGATION AND DEBRIDEMENT OF SACRAL ABSCESS (N/A Back)     Patient location during evaluation: PACU Anesthesia Type: General Level of consciousness: sedated and confused Pain management: pain level controlled Vital Signs Assessment: post-procedure vital signs reviewed and stable Respiratory status: spontaneous breathing Cardiovascular status: stable Anesthetic complications: no    Last Vitals:  Vitals:   07/11/17 1415 07/11/17 1427  BP: 117/75 111/62  Pulse: 76 78  Resp: 18 16  Temp: 36.7 C 36.6 C  SpO2: 100% 100%    Last Pain:  Vitals:   07/11/17 0551  TempSrc: Oral  PainSc:                  [name]

## 2017-07-11 NOTE — Transfer of Care (Signed)
Immediate Anesthesia Transfer of Care Note  Patient: Anna [name]  Procedure(s) Performed: IRRIGATION AND DEBRIDEMENT OF SACRAL ABSCESS (N/A Back)  Patient Location: PACU  Anesthesia Type:General  Level of Consciousness: awake, alert  and patient cooperative  Airway & Oxygen Therapy: Patient Spontanous Breathing and Patient connected to face mask oxygen  Post-op Assessment: Report given to RN, Post -op Vital signs reviewed and stable and Patient moving all extremities  Post vital signs: Reviewed and stable  Last Vitals:  Vitals:   07/11/17 0551 07/11/17 0900  BP: 129/70 129/65  Pulse: 86 98  Resp: 18 18  Temp: 36.7 C   SpO2: 99% 98%    Last Pain:  Vitals:   07/11/17 0551  TempSrc: Oral  PainSc:          Complications: No apparent anesthesia complications

## 2017-07-11 NOTE — Op Note (Signed)
07/11/2017  12:53 PM  PATIENT:  Anna [name]  83 y.o. female  PRE-OPERATIVE DIAGNOSIS:  sacral wound  POST-OPERATIVE DIAGNOSIS:  sacral wound infection  PROCEDURE:  Procedure(s): IRRIGATION AND DEBRIDEMENT ABSCESS (N/A)  SURGEON:  Surgeon(s) and Role:    Ralene Ok, MD - Primary  ANESTHESIA:   general  EBL:  minimal   BLOOD ADMINISTERED:none  DRAINS: 1/2" iodoform guaze   LOCAL MEDICATIONS USED:  NONE  SPECIMEN:  Source of Specimen:  Sacral wound  DISPOSITION OF SPECIMEN:  microbiology  COUNTS:  YES  TOURNIQUET:  * No tourniquets in log *  DICTATION: .Dragon Dictation  Anesthesia patient was consented she was taken back to operative placed in supine position with bilateral SCDs in place. Patient placed under general endotracheal anesthesia. Patient was placed in prone position. Patient was then prepped and draped in the standard fashion. Timeout was called all facts verified.  The area of the sacral wound was opened approximately 5 mm. A Bovie cautery was used to maintain hemostasis and elliptical incision was made over the wound. This was probed and tracked to the right lateral direction. There was some minimal purulence that was seen. This was cultured. There appeared to be fibrinous exudate. This was unroofed. There was a deeper pocket. This also tracked to the right inferior lateral direction. Further purulence was encountered. Cultures were then taken in this space as well.  At this time there appeared to be some necrotic areas of fascia overlying the sacrum. This was peeled away with a rongeur. Small areas of the sacrum were grasped with a rongeur and sent for culture.  There was irrigated out with sterile saline. Hemostasis was excellent identification. The wound was then packed with half-inch iodoform gauze. The wound was addressed with ABD pad, and tape. The patient tolerated the procedure well was taken to recovery room in stable condition.  PLAN OF  CARE: admitted to inpt  PATIENT DISPOSITION:  PACU - hemodynamically stable.   Delay start of Pharmacological VTE agent (>24hrs) due to surgical blood loss or risk of bleeding: no

## 2017-07-11 NOTE — Progress Notes (Signed)
OT Cancellation Note  Patient Details Name: Anna [name] MRN: [id number] DOB: December 13, 1928   Cancelled Treatment:    Reason Eval/Treat Not Completed: Patient at procedure or test/ unavailable(Pt in surgery.)  [name] 07/11/2017, 11:17 AM  07/11/2017 [name], OTR/L Pager: [phone]

## 2017-07-11 NOTE — Plan of Care (Signed)
  Progressing Clinical Measurements: Ability to maintain clinical measurements within normal limits will improve 07/11/2017 2215 - Progressing by Talbert Forest, RN Diagnostic test results will improve 07/11/2017 2215 - Progressing by Talbert Forest, RN Respiratory complications will improve 07/11/2017 2215 - Progressing by Talbert Forest, RN Cardiovascular complication will be avoided 07/11/2017 2215 - Progressing by Talbert Forest, RN Nutrition: Adequate nutrition will be maintained 07/11/2017 2215 - Progressing by Talbert Forest, RN Pain Managment: General experience of comfort will improve 07/11/2017 2215 - Progressing by Talbert Forest, RN Safety: Ability to remain free from injury will improve 07/11/2017 2215 - Progressing by Talbert Forest, RN

## 2017-07-11 NOTE — Plan of Care (Signed)
  Progressing Clinical Measurements: Ability to maintain clinical measurements within normal limits will improve 07/11/2017 1217 - Progressing by Kerrin Mo, RN Will remain free from infection 07/11/2017 1217 - Progressing by Kerrin Mo, RN Note On IV abx.  To OR today for I&D.  Diagnostic test results will improve 07/11/2017 1217 - Progressing by Kerrin Mo, RN Respiratory complications will improve 07/11/2017 1217 - Progressing by Kerrin Mo, RN Cardiovascular complication will be avoided 07/11/2017 1217 - Progressing by Kerrin Mo, RN Nutrition: Adequate nutrition will be maintained 07/11/2017 1217 - Progressing by Kerrin Mo, RN Note PT/OT eval pending Elimination: Will not experience complications related to bowel motility 07/11/2017 1217 - Progressing by Kerrin Mo, RN Pain Managment: General experience of comfort will improve 07/11/2017 1217 - Progressing by Kerrin Mo, RN Safety: Ability to remain free from injury will improve 07/11/2017 1217 - Progressing by Kerrin Mo, RN Skin Integrity: Risk for impaired skin integrity will decrease 07/11/2017 1217 - Progressing by Kerrin Mo, RN

## 2017-07-11 NOTE — Anesthesia Procedure Notes (Signed)
Procedure Name: Intubation Date/Time: 07/11/2017 12:15 PM Performed by: Victoriano Lain, CRNA Pre-anesthesia Checklist: Patient identified, Emergency Drugs available, Suction available and Patient being monitored Patient Re-evaluated:Patient Re-evaluated prior to induction Oxygen Delivery Method: Circle system utilized Induction Type: IV induction Ventilation: Mask ventilation without difficulty Laryngoscope Size: Miller and 2 Grade View: Grade I Tube type: Oral Tube size: 7.0 mm Number of attempts: 2 Airway Equipment and Method: Stylet Placement Confirmation: ETT inserted through vocal cords under direct vision,  positive ETCO2 and breath sounds checked- equal and bilateral Secured at: 21 cm Tube secured with: Tape Dental Injury: Teeth and Oropharynx as per pre-operative assessment  Comments: DL x1 by SRNA with Miller 2, grade 2 view. Tube placed in esophagus. Second DL by Anesthesiologist with Sabra Heck 2, grade 1 view. Successful intubation.

## 2017-07-11 NOTE — Anesthesia Preprocedure Evaluation (Addendum)
Anesthesia Evaluation  Patient identified by MRN, date of birth, ID band Patient awake    Reviewed: Allergy & Precautions, NPO status , Patient's Chart, lab work & pertinent test results  Airway Mallampati: II  TM Distance: >3 FB Neck ROM: Full    Dental no notable dental hx.    Pulmonary shortness of breath, pneumonia,    Pulmonary exam normal breath sounds clear to auscultation       Cardiovascular hypertension, +CHF  Normal cardiovascular exam+ dysrhythmias  Rhythm:Regular Rate:Normal     Neuro/Psych PSYCHIATRIC DISORDERS negative neurological ROS     GI/Hepatic Neg liver ROS, GERD  ,  Endo/Other  diabetesHyperthyroidism   Renal/GU Renal InsufficiencyRenal disease     Musculoskeletal negative musculoskeletal ROS (+)   Abdominal   Peds  Hematology negative hematology ROS (+)   Anesthesia Other Findings   Reproductive/Obstetrics negative OB ROS                                                             Anesthesia Evaluation  Patient identified by MRN, date of birth, ID band Patient awake    Reviewed: Allergy & Precautions, NPO status , Patient's Chart, lab work & pertinent test results  Airway Mallampati: II       Dental   Pulmonary shortness of breath,    breath sounds clear to auscultation       Cardiovascular hypertension, +CHF  + dysrhythmias  Rhythm:Irregular     Neuro/Psych  Neuromuscular disease    GI/Hepatic GERD  ,  Endo/Other  diabetesHyperthyroidism   Renal/GU CRFRenal disease     Musculoskeletal   Abdominal   Peds  Hematology   Anesthesia Other Findings   Reproductive/Obstetrics                             Anesthesia Physical Anesthesia Plan  ASA: IV  Anesthesia Plan: MAC   Post-op Pain Management:    Induction: Intravenous  PONV Risk Score and Plan: 2 and Ondansetron and Dexamethasone  Airway  Management Planned: Natural Airway and Nasal Cannula  Additional Equipment:   Intra-op Plan:   Post-operative Plan:   Informed Consent: I have reviewed the patients History and Physical, chart, labs and discussed the procedure including the risks, benefits and alternatives for the proposed anesthesia with the patient or authorized representative who has indicated his/her understanding and acceptance.     Plan Discussed with: CRNA  Anesthesia Plan Comments:         Anesthesia Quick Evaluation  Anesthesia Physical Anesthesia Plan  ASA: III  Anesthesia Plan: General   Post-op Pain Management:    Induction: Intravenous  PONV Risk Score and Plan: 3  Airway Management Planned: Oral ETT  Additional Equipment:   Intra-op Plan:   Post-operative Plan: Extubation in OR  Informed Consent: I have reviewed the patients History and Physical, chart, labs and discussed the procedure including the risks, benefits and alternatives for the proposed anesthesia with the patient or authorized representative who has indicated his/her understanding and acceptance.   Dental advisory given  Plan Discussed with: CRNA  Anesthesia Plan Comments:         Anesthesia Quick Evaluation

## 2017-07-11 NOTE — Progress Notes (Signed)
PT Cancellation Note  Patient Details Name: CIRE DEYARMIN MRN: [id number] DOB: 1929-05-24   Cancelled Treatment:    Reason Eval/Treat Not Completed: Patient not medically ready. Scheduled for I&D today. Will await recommendations and check back another day.    Weston Anna, MPT Pager: [phone]

## 2017-07-11 NOTE — Discharge Instructions (Signed)
WOUND CARE: - dressing to be changed twice daily - supplies: sterile saline, kerlix, scissors, ABD pads, tape  - remove dressing and all packing carefully, moistening with sterile saline as needed to avoid packing/internal dressing sticking to the wound. - clean edges of skin around the wound with water/gauze, making sure there is no tape debris or leakage left on skin that could cause skin irritation or breakdown. - dampen and clean kerlix with sterile saline and pack wound from wound base to skin level, making sure to take note of any possible areas of wound tracking, tunneling and packing appropriately. Wound can be packed loosely. Trim kerlix to size if a whole kerlix is not required. - cover wound with a dry ABD pad and secure with tape.  - write the date/time on the dry dressing/tape to better track when the last dressing change occurred. - change dressing as needed if leakage occurs, wound gets contaminated, or patient requests to shower. - patient may shower daily with wound open and following the shower the wound should be dried and a clean dressing placed.

## 2017-07-11 NOTE — Progress Notes (Signed)
Subjective/Chief Complaint: Pt with NAE overnight    Objective: Vital signs in last 24 hours: Temp:  [97.5 F (36.4 C)-98 F (36.7 C)] 98 F (36.7 C) (11/20 0551) Pulse Rate:  [86-88] 86 (11/20 0551) Resp:  [18-20] 18 (11/20 0551) BP: (118-131)/(56-70) 129/70 (11/20 0551) SpO2:  [97 %-100 %] 99 % (11/20 0551) Last BM Date: 07/01/17  Intake/Output from previous day: 11/19 0701 - 11/20 0700 In: 420 [IV Piggyback:300] Out: 400 [Urine:400] Intake/Output this shift: No intake/output data recorded.  Constitutional: No acute distress, conversant, appears states age. Eyes: Anicteric sclerae, moist conjunctiva, no lid lag Lungs: Clear to auscultation bilaterally, normal respiratory effort CV: regular rate and rhythm, no murmurs, no peripheral edema, pedal pulses 2+ GI: Soft, no masses or hepatosplenomegaly, non-tender to palpation Skin: No rashes, palpation reveals normal turgor, decub ulcer with small 20mm opening, erythematous Psychiatric: appropriate judgment and insight, oriented to person, place, and time   Lab Results:  Recent Labs    07/10/17 1127 07/11/17 0534  WBC 11.7* 10.1  HGB 13.5 13.2  HCT 41.3 40.2  PLT 143* 102*   BMET Recent Labs    07/10/17 1127 07/11/17 0534  NA 141 141  K 3.0* 3.6  CL 108 108  CO2 27 26  GLUCOSE 117* 138*  BUN 14 16  CREATININE 1.05* 1.15*  CALCIUM 8.9 9.0   PT/INR Recent Labs    07/10/17 1127  LABPROT 14.4  INR 1.13   ABG No results for input(s): PHART, HCO3 in the last 72 hours.  Invalid input(s): PCO2, PO2  Studies/Results: Ct Pelvis Wo Contrast  Result Date: 07/10/2017 CLINICAL DATA:  Fever, coccygeal pain with draining wound s/p steroid injection 3 weeks, wbc's 12.1, hx of hysterectomy, x-ray sacrum/ coccyx, no prev ct's of pelvis Difficult for patient to follow instructions due to dementia EXAM: CT PELVIS WITHOUT CONTRAST TECHNIQUE: Multidetector CT imaging of the pelvis was performed following the  standard protocol without intravenous contrast. Examination is technically limited due to motion artifact. COMPARISON:  None. FINDINGS: Urinary Tract: Bladder wall is not thickened and no bladder filling defects are identified. Distal ureters are decompressed. Bowel: The visualized portions of the colon and small bowel are not abnormally distended. Stool fills the rectosigmoid colon. Scattered diverticula without evidence of diverticulitis. Vascular/Lymphatic: Calcifications in the iliac and external iliac artery's. No aneurysm. Reproductive:  Uterus and ovaries are not enlarged. Other: Small skin defect to the left of the coccyx with underlying gas and soft tissue density measuring 3 cm in diameter. This is consistent with small abscess and fistula. There is suggestion of some bone loss in the adjacent coccyx which may indicate osteomyelitis. Focal loss of presacral fat plane could represent a fistula to the posterior rectum. Musculoskeletal: Degenerative changes in the lower lumbar spine. No expansile bone lesions are appreciated. IMPRESSION: Small abscess and fistula in the soft tissues to the left of the coccyx with possible focal osteomyelitis in the adjacent coccyx. Focal loss of presacral fat plane could represent a fistula to the posterior rectum. Electronically Signed   By: Lucienne Capers M.D.   On: 07/10/2017 02:38   Dg Chest Port 1 View  Result Date: 07/10/2017 CLINICAL DATA:  Initial evaluation for acute fever. EXAM: PORTABLE CHEST 1 VIEW COMPARISON:  Prior radiograph from 03/09/2017. FINDINGS: Moderate cardiomegaly, stable. Mediastinal silhouette within normal limits. Aortic atherosclerosis. Lungs are hypoinflated. Mild diffuse vascular congestion with interstitial prominence, suggesting mild pulmonary interstitial edema. Superimposed left basilar atelectasis and/ or scarring, similar to previous.  Suspected small bilateral pleural effusions. No consolidative opacity. No pneumothorax. No acute  osseus abnormality. Osteopenia. Degenerative changes noted about the right shoulder. IMPRESSION: 1. Cardiomegaly with mild diffuse pulmonary vascular congestion with interstitial prominence, suggesting mild diffuse pulmonary interstitial edema. 2. Suspected small bilateral pleural effusions. 3. Aortic atherosclerosis. Electronically Signed   By: Jeannine Boga M.D.   On: 07/10/2017 00:14    Anti-infectives: Anti-infectives (From admission, onward)   Start     Dose/Rate Route Frequency Ordered Stop   07/11/17 0400  vancomycin (VANCOCIN) IVPB 750 mg/150 ml premix     750 mg 150 mL/hr over 60 Minutes Intravenous Every 24 hours 07/10/17 0318     07/10/17 0800  piperacillin-tazobactam (ZOSYN) IVPB 3.375 g     3.375 g 12.5 mL/hr over 240 Minutes Intravenous Every 8 hours 07/10/17 0318     07/10/17 0200  piperacillin-tazobactam (ZOSYN) IVPB 3.375 g  Status:  Discontinued     3.375 g 100 mL/hr over 30 Minutes Intravenous  Once 07/10/17 0145 07/10/17 0146   07/10/17 0200  vancomycin (VANCOCIN) IVPB 1000 mg/200 mL premix  Status:  Discontinued     1,000 mg 200 mL/hr over 60 Minutes Intravenous  Once 07/10/17 0145 07/10/17 0146   07/10/17 0200  vancomycin (VANCOCIN) 1,500 mg in sodium chloride 0.9 % 500 mL IVPB     1,500 mg 250 mL/hr over 120 Minutes Intravenous  Once 07/10/17 0150 07/10/17 0400   07/10/17 0200  piperacillin-tazobactam (ZOSYN) IVPB 3.375 g     3.375 g 100 mL/hr over 30 Minutes Intravenous  Once 07/10/17 0151 07/10/17 0326      Assessment/Plan:  Sacral decub Stg 4 with infection Past Medical History:  Diagnosis Date  . Alzheimer's disease   . Atrial fibrillation ([place])   . Bronchitis   . CHF (congestive heart failure) ([place])   . Chronic renal insufficiency, stage III (moderate) (HCC)   . Diabetes mellitus without complication ([place])   . Dyspnea    with exertion  . GERD (gastroesophageal reflux disease)   . GI bleeding 11/2013   due to supratherapeutic INR  .  Hyperlipidemia   . Hypertension   . Hyperthyroidism    on amiodarone  . LBBB (left bundle branch block)   . Overactive bladder   . Overweight   . Persistent atrial fibrillation ([place])   . Pneumonia   . Thyroid mass    LLL  . UTI (urinary tract infection)   . Vitamin B12 deficiency   . Vitamin D deficiency     To OR today for I&D of decub    LOS: 1 day    Rosario Jacks., [facility] 07/11/2017

## 2017-07-11 NOTE — Progress Notes (Addendum)
Pharmacy Antibiotic Note  Anna [name] is a 83 y.o. female with AMS and fever admitted on 07/09/2017 with coccyx abscess and possible osteomyelitis.  Pharmacy has been consulted for zosyn/vancomycin dosing for pylonidal abscess vs osteo of coccyx.  Zosyn to be changed to cefepime + metronidazole to reduce risk of nephrotoxicity  Today, 07/11/2017 Day #2 ANTIBIOTICS   Plan for I&D in OR today  SCr - trending up this am, unable to assess  Afebrile  WBC improving  Await culture results - GNR and GPC pairs  Plan: Zosyn to cefepime 1gm IV q12h Adjust vancomycin to 1250mg  IV q48h for small rise in SCr  Daily scr - continue to monitor trend F/u cultures/levels F/u OR findings/cultures  Height: 5\' 5"  (165.1 cm) Weight: 170 lb 3.1 oz (77.2 kg) IBW/kg (Calculated) : 57  Temp (24hrs), Avg:97.7 F (36.5 C), Min:97.5 F (36.4 C), Max:98 F (36.7 C)  Recent Labs  Lab 07/09/17 2241 07/09/17 2352 07/10/17 1127 07/11/17 0534  WBC 12.1*  --  11.7* 10.1  CREATININE 1.07*  --  1.05* 1.15*  LATICACIDVEN  --  1.50  --   --     Estimated Creatinine Clearance: 36.1 mL/min (A) (by C-G formula based on SCr of 1.15 mg/dL (H)).    Allergies  Allergen Reactions  . Pollen Extract     Sneezing, watery eyes    Antimicrobials this admission: 11/19 zosyn >>  11/19 vancomycin >>   Dose adjustments this admission:  Microbiology results: 11/18 BCx: pending 11/18 sacral wound: abundant GPC pairs, mod GNR  Thank you for allowing pharmacy to be a part of this patient's care.  Anna [name], PharmD, BCPS.   Pager: [phone] 07/11/2017 8:54 AM

## 2017-07-11 NOTE — Progress Notes (Addendum)
PROGRESS NOTE    Anna [name]  VQM:[id number] DOB: [birth year] DOA: 07/09/2017 PCP: Dorothyann Peng, NP   Brief Narrative:  Anna [name] is Anna [name] 83 y.o. female with medical history significant of Alzheimer's dementia.  Patient has had 2 shots in sacrum last month for coccygodynia, last on 10/29.  Over the past 2-3 days has developed small draining wound in coccygeal area.  Drainage is purulent and foul smelling.  Today Fever 101.1, improved with tylenol.  Generalized weakness more than usual and non-verbal today though she is minimally verbal at baseline.  Despite this daughter states she did take all PO meds today, was eating and drinking normally (had 1200 cc PO fluid intake over day today which is her recommended limit).     Assessment & Plan:   Principal Problem:   Abscess of coccyx Active Problems:   Benign essential HTN   DM (diabetes mellitus) type II controlled with renal manifestation (HCC)   Acute metabolic encephalopathy   Alzheimer's dementia with behavioral disturbance   Pilonidal abscess   1. 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 Zosyn and vanc -> will transition to vanc/cefepime/flagyl 2. Bcx and wound Cultures pending (wound cx with gram positive cocci and gram negative rods) 3. Surgery c/s, appreciate rcs - To OR this morning 4. Follow up ESR (9) and CRP elevated to 8.2 5. Consider ID c/s if surgical findings c/w osteo  2. UTI: Gram negative rods, covered by abx above.  F/u sensitivies  3. Dementia  Metabolic Encephalopathy - at baseline, daughter notes pt doesn't say much, maybe yes or no.  Currently not responding to questions.  Will attend to voice.   1. Suspect component of delirium due to infection/pain (though daughter notes she's close to  baseline) 2. Continue donepezil 3. Delirium precautions  4. HFrEF (EF 35-40%)  1. Holding home lasix (30 mg daily) for now, CXR with mild interstitial edema and elevated BNP.  Plan to resume after OR. 2. Home metoprolol  5. Atrial Fibrillation:  Chadvasc at least 6, only on ASA right now (family decided on this after what sounds like medication error in the past - followed by outpatient cardiology) 1. Continue metop 2. Hold ASA  6. HTN - continue metoprolol  7. CKD stage III: baseline Cr appears around 1.1-1.2. Today around baseline CrCl ~43.  1. Continue to monitor, renally dose meds  8. Thrombocytopenia: down from admission at 133 to 102 today, continue to monitor  9. DM2 - 1. Hold home meds 2. Sensitive SSI AC for now  7. Continue mirabegron  8. GERD: home PPI   DVT prophylaxis: lovenox Code Status: full  Family Communication: daughter at bedside Disposition Plan: pending improvement, PT/OT eval, treatment of abscess/possible osteo   Consultants:   surgery  Procedures: (Don't include imaging studies which can be auto populated. Include things that cannot be auto populated i.e. Echo, Carotid and venous dopplers, Foley, Bipap, HD, tubes/drains, wound vac, central lines etc)  none  Antimicrobials: (specify start and planned stop date. Auto populated tables are space occupying and do not give end dates) Anti-infectives (From admission, onward)   Start     Dose/Rate Route Frequency Ordered Stop   07/13/17 0800  vancomycin (VANCOCIN) 1,250 mg in sodium chloride 0.9 % 250 mL IVPB  1,250 mg 166.7 mL/hr over 90 Minutes Intravenous Every 48 hours 07/11/17 0848     07/11/17 1115  metroNIDAZOLE (FLAGYL) IVPB 500 mg     500 mg 100 mL/hr over 60 Minutes Intravenous Every 8 hours 07/11/17 1100     07/11/17 0400  vancomycin (VANCOCIN) IVPB 750 mg/150 ml premix  Status:  Discontinued     750 mg 150 mL/hr over 60 Minutes Intravenous Every 24 hours 07/10/17 0318 07/11/17 0848    07/10/17 0800  piperacillin-tazobactam (ZOSYN) IVPB 3.375 g  Status:  Discontinued     3.375 g 12.5 mL/hr over 240 Minutes Intravenous Every 8 hours 07/10/17 0318 07/11/17 1100   07/10/17 0200  piperacillin-tazobactam (ZOSYN) IVPB 3.375 g  Status:  Discontinued     3.375 g 100 mL/hr over 30 Minutes Intravenous  Once 07/10/17 0145 07/10/17 0146   07/10/17 0200  vancomycin (VANCOCIN) IVPB 1000 mg/200 mL premix  Status:  Discontinued     1,000 mg 200 mL/hr over 60 Minutes Intravenous  Once 07/10/17 0145 07/10/17 0146   07/10/17 0200  vancomycin (VANCOCIN) 1,500 mg in sodium chloride 0.9 % 500 mL IVPB     1,500 mg 250 mL/hr over 120 Minutes Intravenous  Once 07/10/17 0150 07/10/17 0400   07/10/17 0200  piperacillin-tazobactam (ZOSYN) IVPB 3.375 g     3.375 g 100 mL/hr over 30 Minutes Intravenous  Once 07/10/17 0151 07/10/17 0326        Subjective: Limited due to dementia. Per daughter, doing well. Said her sons name when he was on the phone with her.   Objective: Vitals:   07/10/17 1258 07/10/17 2111 07/11/17 0551 07/11/17 0900  BP: (!) 118/59 (!) 131/56 129/70 129/65  Pulse: 88 87 86 98  Resp: '18 20 18 18  ' Temp: (!) 97.5 F (36.4 C) 97.6 F (36.4 C) 98 F (36.7 C)   TempSrc: Oral Oral Oral   SpO2: 100% 97% 99% 98%  Weight:      Height:        Intake/Output Summary (Last 24 hours) at 07/11/2017 1108 Last data filed at 07/11/2017 0600 Gross per 24 hour  Intake 420 ml  Output 400 ml  Net 20 ml   Filed Weights   07/10/17 0149 07/10/17 0511  Weight: 80.7 kg (178 lb) 77.2 kg (170 lb 3.1 oz)    Examination:  General: No acute distress. Cardiovascular: Heart sounds show [name] regular rate, and rhythm. No gallops or rubs. No murmurs. No JVD. Lungs: Clear to auscultation bilaterally with good air movement. No rales, rhonchi or wheezes. Abdomen: Soft, nontender, nondistended with normal active bowel sounds. No masses. No hepatosplenomegaly. Neurological: Unable to assess  orientation. Moves all extremities. Cranial nerves II through XII grossly intact. Skin: Did not examine sacrum today, headed out to OR Extremities: No clubbing or cyanosis. No edema. Pedal pulses 2+..   Data Reviewed: I have personally reviewed following labs and imaging studies  CBC: Recent Labs  Lab 07/09/17 2241 07/10/17 1127 07/11/17 0534  WBC 12.1* 11.7* 10.1  NEUTROABS 9.1*  --   --   HGB 14.8 13.5 13.2  HCT 44.3 41.3 40.2  MCV 94.9 95.4 95.3  PLT 133* 143* 233*   Basic Metabolic Panel: Recent Labs  Lab 07/09/17 2241 07/10/17 1127 07/11/17 0534  NA 138 141 141  K 3.6 3.0* 3.6  CL 105 108 108  CO2 '23 27 26  ' GLUCOSE 162* 117* 138*  BUN '18 14 16  ' CREATININE 1.07* 1.05* 1.15*  CALCIUM 9.2  8.9 9.0   GFR: Estimated Creatinine Clearance: 36.1 mL/min (Kasarah Sitts) (by C-G formula based on SCr of 1.15 mg/dL (H)). Liver Function Tests: Recent Labs  Lab 07/09/17 2241 07/10/17 1127  AST 16 21  ALT 13* 13*  ALKPHOS 69 72  BILITOT 1.0 1.8*  PROT 6.4* 5.9*  ALBUMIN 3.6 3.2*   No results for input(s): LIPASE, AMYLASE in the last 168 hours. No results for input(s): AMMONIA in the last 168 hours. Coagulation Profile: Recent Labs  Lab 07/10/17 1127  INR 1.13   Cardiac Enzymes: No results for input(s): CKTOTAL, CKMB, CKMBINDEX, TROPONINI in the last 168 hours. BNP (last 3 results) No results for input(s): PROBNP in the last 8760 hours. HbA1C: No results for input(s): HGBA1C in the last 72 hours. CBG: Recent Labs  Lab 07/10/17 0813 07/10/17 1137 07/10/17 1655 07/10/17 2107 07/11/17 0733  GLUCAP 123* 112* 108* 219* 120*   Lipid Profile: No results for input(s): CHOL, HDL, LDLCALC, TRIG, CHOLHDL, LDLDIRECT in the last 72 hours. Thyroid Function Tests: No results for input(s): TSH, T4TOTAL, FREET4, T3FREE, THYROIDAB in the last 72 hours. Anemia Panel: No results for input(s): VITAMINB12, FOLATE, FERRITIN, TIBC, IRON, RETICCTPCT in the last 72 hours. Sepsis  Labs: Recent Labs  Lab 07/09/17 2352  LATICACIDVEN 1.50    Recent Results (from the past 240 hour(s))  Culture, Urine     Status: Abnormal (Preliminary result)   Collection Time: 07/09/17 11:08 PM  Result Value Ref Range Status   Specimen Description URINE, CLEAN CATCH  Final   Special Requests NONE  Final   Culture >=100,000 COLONIES/mL GRAM NEGATIVE RODS (Joaovictor Krone)  Final   Report Status PENDING  Incomplete  Wound or Superficial Culture     Status: None (Preliminary result)   Collection Time: 07/09/17 11:29 PM  Result Value Ref Range Status   Specimen Description SACRAL  Final   Special Requests NONE  Final   Gram Stain   Final    RARE WBC PRESENT, PREDOMINANTLY MONONUCLEAR ABUNDANT GRAM POSITIVE COCCI IN PAIRS MODERATE GRAM NEGATIVE RODS Performed at [facility], [place] [address] [place], [place] [address]    Culture PENDING  Incomplete   Report Status PENDING  Incomplete  Surgical PCR screen     Status: None   Collection Time: 07/10/17  9:51 PM  Result Value Ref Range Status   MRSA, PCR NEGATIVE NEGATIVE Final   Staphylococcus aureus NEGATIVE NEGATIVE Final    Comment: (NOTE) The Xpert SA Assay (FDA approved for NASAL specimens in patients 24 years of age and older), is one component of Chasin [facility]ling comprehensive surveillance program. It is not intended to diagnose infection nor to guide or monitor treatment.          Radiology Studies: Ct Pelvis Wo Contrast  Result Date: 07/10/2017 CLINICAL DATA:  Fever, coccygeal pain with draining wound s/p steroid injection 3 weeks, wbc's 12.1, hx of hysterectomy, x-ray sacrum/ coccyx, no prev ct's of pelvis Difficult for patient to follow instructions due to dementia EXAM: CT PELVIS WITHOUT CONTRAST TECHNIQUE: Multidetector CT imaging of the pelvis was performed following the standard protocol without intravenous contrast. Examination is technically limited due to motion artifact. COMPARISON:  None. FINDINGS: Urinary Tract: Bladder wall  is not thickened and no bladder filling defects are identified. Distal ureters are decompressed. Bowel: The visualized portions of the colon and small bowel are not abnormally distended. Stool fills the rectosigmoid colon. Scattered diverticula without evidence of diverticulitis. Vascular/Lymphatic: Calcifications in the iliac and external iliac  artery's. No aneurysm. Reproductive:  Uterus and ovaries are not enlarged. Other: Small skin defect to the left of the coccyx with underlying gas and soft tissue density measuring 3 cm in diameter. This is consistent with small abscess and fistula. There is suggestion of some bone loss in the adjacent coccyx which may indicate osteomyelitis. Focal loss of presacral fat plane could represent Davanta Meuser fistula to the posterior rectum. Musculoskeletal: Degenerative changes in the lower lumbar spine. No expansile bone lesions are appreciated. IMPRESSION: Small abscess and fistula in the soft tissues to the left of the coccyx with possible focal osteomyelitis in the adjacent coccyx. Focal loss of presacral fat plane could represent Lamyia Cdebaca fistula to the posterior rectum. Electronically Signed   By: Lucienne Capers M.D.   On: 07/10/2017 02:38   Dg Chest Port 1 View  Result Date: 07/10/2017 CLINICAL DATA:  Initial evaluation for acute fever. EXAM: PORTABLE CHEST 1 VIEW COMPARISON:  Prior radiograph from 03/09/2017. FINDINGS: Moderate cardiomegaly, stable. Mediastinal silhouette within normal limits. Aortic atherosclerosis. Lungs are hypoinflated. Mild diffuse vascular congestion with interstitial prominence, suggesting mild pulmonary interstitial edema. Superimposed left basilar atelectasis and/ or scarring, similar to previous. Suspected small bilateral pleural effusions. No consolidative opacity. No pneumothorax. No acute osseus abnormality. Osteopenia. Degenerative changes noted about the right shoulder. IMPRESSION: 1. Cardiomegaly with mild diffuse pulmonary vascular congestion with  interstitial prominence, suggesting mild diffuse pulmonary interstitial edema. 2. Suspected small bilateral pleural effusions. 3. Aortic atherosclerosis. Electronically Signed   By: Jeannine Boga M.D.   On: 07/10/2017 00:14        Scheduled Meds: . calcitRIOL  0.5 mcg Oral Once per day on Mon Wed Fri  . donepezil  10 mg Oral Daily  . enoxaparin (LOVENOX) injection  40 mg Subcutaneous Q24H  . fluticasone  2 spray Each Nare Daily  . insulin aspart  0-9 Units Subcutaneous TID WC  . lidocaine (PF)  2 mL Other Once  . loratadine  5 mg Oral Daily  . memantine  5 mg Oral QHS  . metoprolol tartrate  12.5 mg Oral BID  . mirabegron ER  25 mg Oral Daily  . multivitamins with iron  1 tablet Oral Weekly  . pantoprazole  40 mg Oral Daily  . vitamin B-12  500 mcg Oral Daily   Continuous Infusions: . metronidazole    . [START ON 07/13/2017] vancomycin       LOS: 1 day    Time spent: over 20 minutes    Fayrene Helper, MD [facility] Pager [phone]  If 7PM-7AM, please contact night-coverage [email or web address] Password [email or web address] 07/11/2017, 11:08 AM

## 2017-07-11 NOTE — Progress Notes (Signed)
Pt returned from OR on bed. VSS. Appears uncomfortable lying on back, repositioned on right side. Family at bedside. Will monitor.

## 2017-07-12 ENCOUNTER — Inpatient Hospital Stay (HOSPITAL_COMMUNITY): Payer: Medicare HMO

## 2017-07-12 ENCOUNTER — Encounter (HOSPITAL_COMMUNITY): Payer: Self-pay | Admitting: General Surgery

## 2017-07-12 DIAGNOSIS — G309 Alzheimer's disease, unspecified: Secondary | ICD-10-CM

## 2017-07-12 DIAGNOSIS — A1801 Tuberculosis of spine: Secondary | ICD-10-CM

## 2017-07-12 DIAGNOSIS — F0281 Dementia in other diseases classified elsewhere with behavioral disturbance: Secondary | ICD-10-CM

## 2017-07-12 DIAGNOSIS — I10 Essential (primary) hypertension: Secondary | ICD-10-CM

## 2017-07-12 DIAGNOSIS — E1121 Type 2 diabetes mellitus with diabetic nephropathy: Secondary | ICD-10-CM

## 2017-07-12 DIAGNOSIS — M533 Sacrococcygeal disorders, not elsewhere classified: Secondary | ICD-10-CM

## 2017-07-12 DIAGNOSIS — G9341 Metabolic encephalopathy: Secondary | ICD-10-CM

## 2017-07-12 LAB — BASIC METABOLIC PANEL
Anion gap: 7 (ref 5–15)
BUN: 14 mg/dL (ref 6–20)
CALCIUM: 9 mg/dL (ref 8.9–10.3)
CO2: 26 mmol/L (ref 22–32)
CREATININE: 1.05 mg/dL — AB (ref 0.44–1.00)
Chloride: 108 mmol/L (ref 101–111)
GFR calc Af Amer: 54 mL/min — ABNORMAL LOW (ref >60)
GFR calc non Af Amer: 47 mL/min — ABNORMAL LOW (ref >60)
GLUCOSE: 95 mg/dL (ref 65–99)
Potassium: 4.2 mmol/L (ref 3.5–5.1)
Sodium: 141 mmol/L (ref 135–145)

## 2017-07-12 LAB — CBC
HEMATOCRIT: 40.1 % (ref 36.0–46.0)
Hemoglobin: 13.1 g/dL (ref 12.0–15.0)
MCH: 31.7 pg (ref 26.0–34.0)
MCHC: 32.7 g/dL (ref 30.0–36.0)
MCV: 97.1 fL (ref 78.0–100.0)
Platelets: 123 10*3/uL — ABNORMAL LOW (ref 150–400)
RBC: 4.13 MIL/uL (ref 3.87–5.11)
RDW: 14.2 % (ref 11.5–15.5)
WBC: 7.3 10*3/uL (ref 4.0–10.5)

## 2017-07-12 LAB — URINE CULTURE: Culture: >=100000 — AB

## 2017-07-12 LAB — GLUCOSE, CAPILLARY
GLUCOSE-CAPILLARY: 110 mg/dL — AB (ref 65–99)
GLUCOSE-CAPILLARY: 97 mg/dL (ref 65–99)
GLUCOSE-CAPILLARY: 135 mg/dL — AB (ref 65–99)
GLUCOSE-CAPILLARY: 127 mg/dL — AB (ref 65–99)
Glucose-Capillary: 189 mg/dL — ABNORMAL HIGH (ref 65–99)

## 2017-07-12 NOTE — Progress Notes (Signed)
Patient ID: Anna [name], female   DOB: 04/12/1929, 83 y.o.   MRN: [id number]  [facility] Surgery Progress Note  1 Day Post-Op  Subjective: CC-  Resting comfortably, daughter at bedside. States that packing came out from sacral wound early this morning and has not been repacked yet. WBC 7.3, afebrile.  Objective: Vital signs in last 24 hours: Temp:  [97.9 F (36.6 C)-98.4 F (36.9 C)] 98.4 F (36.9 C) (11/21 0520) Pulse Rate:  [71-95] 74 (11/21 0520) Resp:  [12-20] 16 (11/21 0520) BP: (111-141)/(48-92) 141/82 (11/21 0520) SpO2:  [95 %-100 %] 98 % (11/21 0520) Last BM Date: 07/01/17  Intake/Output from previous day: 11/20 0701 - 11/21 0700 In: 1250 [P.O.:120; I.V.:600; IV Piggyback:450] Out: 260 [Urine:250; Blood:10] Intake/Output this shift: No intake/output data recorded.  PE: Gen:  Alert, NAD, nonverbal HEENT: EOM's intact, pupils equal and round Pulm:  effort normal Sacrum: wound over sacrum about 2-3cm, round, no purulent drainage or surrounding erythema  Lab Results:  Recent Labs    07/11/17 0534 07/12/17 0511  WBC 10.1 7.3  HGB 13.2 13.1  HCT 40.2 40.1  PLT 102* 123*   BMET Recent Labs    07/11/17 0534 07/12/17 0511  NA 141 141  K 3.6 4.2  CL 108 108  CO2 26 26  GLUCOSE 138* 95  BUN 16 14  CREATININE 1.15* 1.05*  CALCIUM 9.0 9.0   PT/INR Recent Labs    07/10/17 1127  LABPROT 14.4  INR 1.13   CMP     Component Value Date/Time   NA 141 07/12/2017 0511   NA 142 05/26/2017 0917   K 4.2 07/12/2017 0511   CL 108 07/12/2017 0511   CO2 26 07/12/2017 0511   GLUCOSE 95 07/12/2017 0511   BUN 14 07/12/2017 0511   BUN 20 05/26/2017 0917   CREATININE 1.05 (H) 07/12/2017 0511   CREATININE 1.25 (H) 09/27/2016 1201   CALCIUM 9.0 07/12/2017 0511   PROT 5.9 (L) 07/10/2017 1127   PROT 6.5 05/26/2017 0917   ALBUMIN 3.2 (L) 07/10/2017 1127   ALBUMIN 4.0 05/26/2017 0917   AST 21 07/10/2017 1127   ALT 13 (L) 07/10/2017 1127   ALKPHOS 72  07/10/2017 1127   BILITOT 1.8 (H) 07/10/2017 1127   BILITOT 0.7 05/26/2017 0917   GFRNONAA 47 (L) 07/12/2017 0511   GFRAA 54 (L) 07/12/2017 0511   Lipase  No results found for: LIPASE     Studies/Results: No results found.  Anti-infectives: Anti-infectives (From admission, onward)   Start     Dose/Rate Route Frequency Ordered Stop   07/13/17 0800  vancomycin (VANCOCIN) 1,250 mg in sodium chloride 0.9 % 250 mL IVPB     1,250 mg 166.7 mL/hr over 90 Minutes Intravenous Every 48 hours 07/11/17 0848     07/11/17 1800  ceFEPIme (MAXIPIME) 1 g in dextrose 5 % 50 mL IVPB     1 g 100 mL/hr over 30 Minutes Intravenous Every 12 hours 07/11/17 1122     07/11/17 1115  metroNIDAZOLE (FLAGYL) IVPB 500 mg     500 mg 100 mL/hr over 60 Minutes Intravenous Every 8 hours 07/11/17 1100     07/11/17 0400  vancomycin (VANCOCIN) IVPB 750 mg/150 ml premix  Status:  Discontinued     750 mg 150 mL/hr over 60 Minutes Intravenous Every 24 hours 07/10/17 0318 07/11/17 0848   07/10/17 0800  piperacillin-tazobactam (ZOSYN) IVPB 3.375 g  Status:  Discontinued     3.375 g 12.5 mL/hr over  240 Minutes Intravenous Every 8 hours 07/10/17 0318 07/11/17 1100   07/10/17 0200  piperacillin-tazobactam (ZOSYN) IVPB 3.375 g  Status:  Discontinued     3.375 g 100 mL/hr over 30 Minutes Intravenous  Once 07/10/17 0145 07/10/17 0146   07/10/17 0200  vancomycin (VANCOCIN) IVPB 1000 mg/200 mL premix  Status:  Discontinued     1,000 mg 200 mL/hr over 60 Minutes Intravenous  Once 07/10/17 0145 07/10/17 0146   07/10/17 0200  vancomycin (VANCOCIN) 1,500 mg in sodium chloride 0.9 % 500 mL IVPB     1,500 mg 250 mL/hr over 120 Minutes Intravenous  Once 07/10/17 0150 07/10/17 0400   07/10/17 0200  piperacillin-tazobactam (ZOSYN) IVPB 3.375 g     3.375 g 100 mL/hr over 30 Minutes Intravenous  Once 07/10/17 0151 07/10/17 0326       Assessment/Plan Alzheimer's dementia HTN DM Atrial fibrillation CHF - EF 35-40%  (09/16/16)  Sacral wound S/p irrigation and debridement abscess 11/20 Dr. Rosendo Gros - twice daily BID wet to dry dressing changes - culture pending  ID - zosyn 11/19>>11/20, vancomycin 11/19>>, flagyl 11/20>>, maxipime 11/20>> VTE - SCDs, lovenox FEN - IVF, soft diet Foley - none  Plan - Start twice daily wet to dry dressing changes. Continue IV antibiotics and follow blood and wound cultures. PT/OT consults pending.   LOS: 2 days    Wellington Hampshire , [facility] Surgery 07/12/2017, 11:15 AM Pager: [phone] Consults: [phone] Mon-Fri 7:00 am-4:30 pm Sat-Sun 7:00 am-11:30 am

## 2017-07-12 NOTE — Evaluation (Signed)
Occupational Therapy Evaluation Patient Details Name: Anna [name] MRN: [id number] DOB: 06-May-1929 Today's Date: 07/12/2017    History of Present Illness Pt admitted with coccyx abscess, underwent I&D 11/20. PMH: dementia, HTN. DM, CHF, afib.   Clinical Impression   Pt walks a few steps with a RW and assist and does some self feeding, but is otherwise dependent in ADL. Pt presents with buttocks pain, decreased activity tolerance, impaired sitting and standing balance, and baseline cognitive impairment with minimal verbalization. She requires total assist for ADL and 2 person assist for mobility. Will follow acutely. Daughter will take pt home upon discharge.    Follow Up Recommendations  No OT follow up;Supervision/Assistance - 24 hour    Equipment Recommendations  None recommended by OT    Recommendations for Other Services       Precautions / Restrictions Precautions Precautions: Fall Precaution Comments: painful bottom Restrictions Weight Bearing Restrictions: No      Mobility Bed Mobility Overal bed mobility: Needs Assistance Bed Mobility: Supine to Sit;Sit to Supine;Rolling Rolling: Max assist   Supine to sit: +2 for physical assistance;Max assist Sit to supine: +2 for physical assistance;Max assist   General bed mobility comments: assist for all aspects  Transfers Overall transfer level: Needs assistance Equipment used: Rolling walker (2 wheeled) Transfers: Sit to/from Stand Sit to Stand: +2 physical assistance;Mod assist         General transfer comment: assist to rise, steady and maintain for 10 seconds or less, stood x 3    Balance Overall balance assessment: Needs assistance   Sitting balance-Leahy Scale: Poor       Standing balance-Leahy Scale: Poor                             ADL either performed or assessed with clinical judgement   ADL Overall ADL's : Needs assistance/impaired                                        General ADL Comments: Pt currently requires total assist for ADL.     Vision Patient Visual Report: No change from baseline       Perception     Praxis      Pertinent Vitals/Pain Pain Assessment: Faces Faces Pain Scale: Hurts whole lot Pain Location: buttocks Pain Descriptors / Indicators: Grimacing;Guarding;Restless Pain Intervention(s): Monitored during session;Premedicated before session;Repositioned     Hand Dominance Right   Extremity/Trunk Assessment Upper Extremity Assessment Upper Extremity Assessment: Generalized weakness   Lower Extremity Assessment Lower Extremity Assessment: Defer to PT evaluation   Cervical / Trunk Assessment Cervical / Trunk Assessment: (weakness)   Communication Communication Communication: Expressive difficulties(very little verbalizations)   Cognition Arousal/Alertness: Awake/alert Behavior During Therapy: Flat affect Overall Cognitive Status: History of cognitive impairments - at baseline                                     General Comments       Exercises     Shoulder Instructions      Home Living Family/patient expects to be discharged to:: Private residence Living Arrangements: Children(daughter) Available Help at Discharge: Family;Available 24 hours/day Type of Home: House  Home Equipment: [place] - 2 wheels;Bedside commode;Wheelchair - manual          Prior Functioning/Environment Level of Independence: Needs assistance  Gait / Transfers Assistance Needed: walks 4-5 steps with RW and assist, otherwise w/c dependent ADL's / Homemaking Assistance Needed: Can self feed finger foods and some drinks, dependent in bathing, dressing, toileting            OT Problem List: Impaired balance (sitting and/or standing);Decreased activity tolerance;Decreased safety awareness;Decreased knowledge of use of DME or AE;Impaired UE functional use;Pain;Decreased strength       OT Treatment/Interventions: Self-care/ADL training;DME and/or AE instruction;Patient/family education;Balance training;Therapeutic activities    OT Goals(Current goals can be found in the care plan section) Acute Rehab OT Goals Patient Stated Goal: daughter wants to take pt home OT Goal Formulation: With family Time For Goal Achievement: 07/26/17 Potential to Achieve Goals: Good ADL Goals Pt Will Perform Eating: (P) sitting;with mod assist Pt Will Perform Grooming: (P) with mod assist;sitting Pt Will Transfer to Toilet: (P) with mod assist;stand pivot transfer;bedside commode Additional ADL Goal #1: (P) Pt will tolerate sitting with supervision at EOB x 5 minutes. Additional ADL Goal #2: (P) Pt will perform bed mobility with moderate assistance in preparation for ADL.  OT Frequency: Min 2X/week   Barriers to D/C:            Co-evaluation PT/OT/SLP Co-Evaluation/Treatment: Yes Reason for Co-Treatment: For patient/therapist safety   OT goals addressed during session: Strengthening/ROM      AM-PAC PT "6 Clicks" Daily Activity     Outcome Measure Help from another person eating meals?: Total Help from another person taking care of personal grooming?: Total Help from another person toileting, which includes using toliet, bedpan, or urinal?: Total Help from another person bathing (including washing, rinsing, drying)?: Total Help from another person to put on and taking off regular upper body clothing?: Total Help from another person to put on and taking off regular lower body clothing?: Total 6 Click Score: 6   End of Session Equipment Utilized During Treatment: Rolling walker Nurse Communication: (replaced purewick)  Activity Tolerance: Patient limited by pain;Patient limited by fatigue Patient left: in bed;with call bell/phone within reach;with bed alarm set;with family/visitor present;with nursing/sitter in room  OT Visit Diagnosis: Unsteadiness on feet  (R26.81);Pain;Feeding difficulties (R63.3);Muscle weakness (generalized) (M62.81);Other symptoms and signs involving cognitive function                Time: 1419-1441 OT Time Calculation (min): 22 min Charges:  OT General Charges $OT Visit: 1 Visit OT Evaluation $OT Eval Moderate Complexity: 1 Mod G-Codes:     Anna [name] 07/12/2017, 3:20 PM  07/12/2017 [name], OTR/L Pager: [phone]

## 2017-07-12 NOTE — Progress Notes (Signed)
Wound dressing saturated and out when bathed this AM. Wound cleansed with NS and covered with ABD pad and tape.

## 2017-07-12 NOTE — Progress Notes (Signed)
PROGRESS NOTE    Anna [name]  PYK:[id number] DOB: Sep 10, 1928 DOA: 07/09/2017 PCP: Dorothyann Peng, NP    Brief Narrative: Anna Bortle Meadowsis a 83 y.o.femalewith medical history significant ofAlzheimer's dementia. Patient has had 2 shots in sacrum last month for coccygodynia, last on 10/29, it was followed by worsening pain and a small draining wound area.  Also developed fever and was brought to ED for further evaluation.  She was admitted to medical service for evaluation of the abscesses of the coccygeal area.    Assessment & Plan:   Principal Problem:   Abscess of coccyx Active Problems:   Benign essential HTN   DM (diabetes mellitus) type II controlled with renal manifestation (HCC)   Acute metabolic encephalopathy   Alzheimer's dementia with behavioral disturbance   Pilonidal abscess   Abscess of the coccyx:  CT of the pelvis on admission shows small abscesses possible fistula of the left of coccyx, with focal loss of the sacral fat plane possible represent fistula to posterior rectum.  She was started on broad-spectrum antibiotics.  Blood cultures sent, have been negative so far.  Surgery consulted and she underwent IND and wound cultures sent for analysis and they have been pending so far. As per surgery there was no evidence of any fistula. Continue with the wound care daily dressing changes as per surgery recommendations, pain control, physical therapy, ambulate the patient out of bed.   Klebsiella urinary tract infection On broad-spectrum antibiotics.   Chronic atrial fibrillation Currently on aspirin, follow-up as an outpatient with cardiology.    Hypertension well controlled continue metoprolol   Stage III CKD Baseline creatinine appears to be around 1.1.  Avoid nephrotoxins.   Mild thrombocytopenia possibly secondary to infection continue to monitor.  No signs of bleeding.  Platelet count improving.   Dementia/metabolic encephalopathy Possibly  secondary to infection.  Resume home medications of donepezil.   Type 2 diabetes mellitus Holding home medications, continue with sensitive sliding scale for now.   History of chronic systolic heart failure Patient's last echo was done in January 2018, left ventricular ejection fraction 35% to 40%.  Continue Lasix.   Cough nonproductive, get repeat chest x-ray.   DVT prophylaxis: Lovenox Code Status: Full code Family Communication: Daughter at bedside Disposition Plan: Pending further evaluation by PT   Consultants:   Surgery   Procedures: Status post irrigation and debridement of the abscess on November 20 by Dr. Rosendo Gros  Antimicrobials: Vancomycin, cefepime , Flagyl  Subjective: Pain better controlled, daughter at bedside reports she has been coughing dry cough.  No shortness of breath or chest pain.  Objective: Vitals:   07/11/17 1415 07/11/17 1427 07/11/17 2041 07/12/17 0520  BP: 117/75 111/62 (!) 138/48 (!) 141/82  Pulse: 76 78 95 74  Resp: 18 16 16 16   Temp: 98.1 F (36.7 C) 97.9 F (36.6 C) 98.3 F (36.8 C) 98.4 F (36.9 C)  TempSrc:   Oral Oral  SpO2: 100% 100% 95% 98%  Weight:      Height:        Intake/Output Summary (Last 24 hours) at 07/12/2017 1528 Last data filed at 07/12/2017 1000 Gross per 24 hour  Intake 650 ml  Output 250 ml  Net 400 ml   Filed Weights   07/10/17 0149 07/10/17 0511  Weight: 80.7 kg (178 lb) 77.2 kg (170 lb 3.1 oz)    Examination:  General exam: Appears calm and comfortable  Respiratory system: Clear to auscultation. Respiratory effort normal. Cardiovascular system: S1 &  S2 heard, RRR. No JVD, murmurs, rubs, gallops or clicks. No pedal edema. Gastrointestinal system: Abdomen is nondistended, soft and nontender. No organomegaly or masses felt. Normal bowel sounds heard. Central nervous system: Alert and oriented. No focal neurological deficits. Extremities: Symmetric 5 x 5 power. Skin: sacral abscess, bandaged.    Psychiatry: Judgement and insight appear normal. Mood & affect appropriate.     Data Reviewed: I have personally reviewed following labs and imaging studies  CBC: Recent Labs  Lab 07/09/17 2241 07/10/17 1127 07/11/17 0534 07/12/17 0511  WBC 12.1* 11.7* 10.1 7.3  NEUTROABS 9.1*  --   --   --   HGB 14.8 13.5 13.2 13.1  HCT 44.3 41.3 40.2 40.1  MCV 94.9 95.4 95.3 97.1  PLT 133* 143* 102* 323*   Basic Metabolic Panel: Recent Labs  Lab 07/09/17 2241 07/10/17 1127 07/11/17 0534 07/12/17 0511  NA 138 141 141 141  K 3.6 3.0* 3.6 4.2  CL 105 108 108 108  CO2 23 27 26 26   GLUCOSE 162* 117* 138* 95  BUN 18 14 16 14   CREATININE 1.07* 1.05* 1.15* 1.05*  CALCIUM 9.2 8.9 9.0 9.0   GFR: Estimated Creatinine Clearance: 39.5 mL/min (A) (by C-G formula based on SCr of 1.05 mg/dL (H)). Liver Function Tests: Recent Labs  Lab 07/09/17 2241 07/10/17 1127  AST 16 21  ALT 13* 13*  ALKPHOS 69 72  BILITOT 1.0 1.8*  PROT 6.4* 5.9*  ALBUMIN 3.6 3.2*   No results for input(s): LIPASE, AMYLASE in the last 168 hours. No results for input(s): AMMONIA in the last 168 hours. Coagulation Profile: Recent Labs  Lab 07/10/17 1127  INR 1.13   Cardiac Enzymes: No results for input(s): CKTOTAL, CKMB, CKMBINDEX, TROPONINI in the last 168 hours. BNP (last 3 results) No results for input(s): PROBNP in the last 8760 hours. HbA1C: No results for input(s): HGBA1C in the last 72 hours. CBG: Recent Labs  Lab 07/11/17 0733 07/11/17 1415 07/11/17 2123 07/12/17 0739 07/12/17 1139  GLUCAP 120* 112* 178* 97 135*   Lipid Profile: No results for input(s): CHOL, HDL, LDLCALC, TRIG, CHOLHDL, LDLDIRECT in the last 72 hours. Thyroid Function Tests: No results for input(s): TSH, T4TOTAL, FREET4, T3FREE, THYROIDAB in the last 72 hours. Anemia Panel: No results for input(s): VITAMINB12, FOLATE, FERRITIN, TIBC, IRON, RETICCTPCT in the last 72 hours. Sepsis Labs: Recent Labs  Lab 07/09/17 2352   LATICACIDVEN 1.50    Recent Results (from the past 240 hour(s))  Culture, Urine     Status: Abnormal   Collection Time: 07/09/17 11:08 PM  Result Value Ref Range Status   Specimen Description URINE, CLEAN CATCH  Final   Special Requests NONE  Final   Culture >=100,000 COLONIES/mL KLEBSIELLA PNEUMONIAE (A)  Final   Report Status 07/12/2017 FINAL  Final   Organism ID, Bacteria KLEBSIELLA PNEUMONIAE (A)  Final      Susceptibility   Klebsiella pneumoniae - MIC*    AMPICILLIN >=32 RESISTANT Resistant     CEFAZOLIN <=4 SENSITIVE Sensitive     CEFTRIAXONE <=1 SENSITIVE Sensitive     CIPROFLOXACIN <=0.25 SENSITIVE Sensitive     GENTAMICIN <=1 SENSITIVE Sensitive     IMIPENEM <=0.25 SENSITIVE Sensitive     NITROFURANTOIN 32 SENSITIVE Sensitive     TRIMETH/SULFA <=20 SENSITIVE Sensitive     AMPICILLIN/SULBACTAM 4 SENSITIVE Sensitive     PIP/TAZO <=4 SENSITIVE Sensitive     Extended ESBL NEGATIVE Sensitive     * >=100,000 COLONIES/mL KLEBSIELLA PNEUMONIAE  Blood Culture (routine x 2)     Status: None (Preliminary result)   Collection Time: 07/09/17 11:28 PM  Result Value Ref Range Status   Specimen Description BLOOD RIGHT HAND  Final   Special Requests   Final    BOTTLES DRAWN AEROBIC AND ANAEROBIC Blood Culture adequate volume   Culture   Final    NO GROWTH 1 DAY Performed at [facility], [place] [address] [place], [place] [address]    Report Status PENDING  Incomplete  Wound or Superficial Culture     Status: None (Preliminary result)   Collection Time: 07/09/17 11:29 PM  Result Value Ref Range Status   Specimen Description SACRAL  Final   Special Requests NONE  Final   Gram Stain   Final    RARE WBC PRESENT, PREDOMINANTLY MONONUCLEAR ABUNDANT GRAM POSITIVE COCCI IN PAIRS MODERATE GRAM NEGATIVE RODS    Culture   Final    CULTURE REINCUBATED FOR BETTER GROWTH Performed at [facility], [place] [address], [place], [place] [address]    Report Status PENDING   Incomplete  Blood Culture (routine x 2)     Status: None (Preliminary result)   Collection Time: 07/09/17 11:33 PM  Result Value Ref Range Status   Specimen Description BLOOD LEFT HAND  Final   Special Requests   Final    BOTTLES DRAWN AEROBIC AND ANAEROBIC Blood Culture adequate volume   Culture   Final    NO GROWTH 1 DAY Performed at [facility], [place] [address], [place], [place] [address]    Report Status PENDING  Incomplete  Surgical PCR screen     Status: None   Collection Time: 07/10/17  9:51 PM  Result Value Ref Range Status   MRSA, PCR NEGATIVE NEGATIVE Final   Staphylococcus aureus NEGATIVE NEGATIVE Final    Comment: (NOTE) The Xpert SA Assay (FDA approved for NASAL specimens in patients 41 years of age and older), is one component of a comprehensive surveillance program. It is not intended to diagnose infection nor to guide or monitor treatment.   Aerobic/Anaerobic Culture (surgical/deep wound)     Status: None (Preliminary result)   Collection Time: 07/11/17 12:47 PM  Result Value Ref Range Status   Specimen Description BONE  Final   Special Requests NONE  Final   Gram Stain   Final    RARE WBC PRESENT, PREDOMINANTLY PMN RARE GRAM POSITIVE COCCI Performed at [facility], [place] [address] [place], [place] [address]    Culture PENDING  Incomplete   Report Status PENDING  Incomplete  Aerobic/Anaerobic Culture (surgical/deep wound)     Status: None (Preliminary result)   Collection Time: 07/11/17 12:50 PM  Result Value Ref Range Status   Specimen Description WOUND SACRAL  Final   Special Requests NONE  Final   Gram Stain   Final    RARE WBC PRESENT,BOTH PMN AND MONONUCLEAR NO ORGANISMS SEEN Performed at [facility], [place] [address] [place], [place] [address]    Culture PENDING  Incomplete   Report Status PENDING  Incomplete  Aerobic Culture (superficial specimen)     Status: None (Preliminary result)   Collection Time: 07/11/17 12:50 PM   Result Value Ref Range Status   Specimen Description WOUND SACRAL  Final   Special Requests NONE  Final   Gram Stain NO WBC SEEN NO ORGANISMS SEEN   Final   Culture   Final    CULTURE REINCUBATED  FOR BETTER GROWTH Performed at [facility], [place] [address], [place],  [address]    Report Status PENDING  Incomplete         Radiology Studies: No results found.      Scheduled Meds: . calcitRIOL  0.5 mcg Oral Once per day on Mon Wed Fri  . donepezil  10 mg Oral Daily  . enoxaparin (LOVENOX) injection  40 mg Subcutaneous Q24H  . fluticasone  2 spray Each Nare Daily  . furosemide  30 mg Oral Daily  . insulin aspart  0-9 Units Subcutaneous TID WC  . lidocaine (PF)  2 mL Other Once  . loratadine  5 mg Oral Daily  . memantine  5 mg Oral QHS  . metoprolol tartrate  12.5 mg Oral BID  . mirabegron ER  25 mg Oral Daily  . multivitamins with iron  1 tablet Oral Weekly  . pantoprazole  40 mg Oral Daily  . vitamin B-12  500 mcg Oral Daily   Continuous Infusions: . ceFEPime (MAXIPIME) IV Stopped (07/12/17 0610)  . metronidazole Stopped (07/12/17 1130)  . [START ON 07/13/2017] vancomycin       LOS: 2 days    Time spent: 40 minutes.     Hosie Poisson, MD [facility] Pager [phone]  If 7PM-7AM, please contact night-coverage [email or web address] Password [facility] 07/12/2017, 3:28 PM

## 2017-07-12 NOTE — Care Management Note (Signed)
Case Management Note  Patient Details  Name: Anna [name] MRN: [id number] Date of Birth: 09-05-1928  Subjective/Objective: 83 y/o f admitted w/abscess of coccyx. From home. POD#1 I&D. PT cons-await recc.                   Action/Plan:d/c plan home.   Expected Discharge Date:                  Expected Discharge Plan:  [place]  In-House Referral:     Discharge planning Services  CM Consult  Post Acute Care Choice:    Choice offered to:     DME Arranged:    DME Agency:     HH Arranged:    HH Agency:     Status of Service:     If discussed at [employer], dates discussed:    Additional Comments:  Dessa Phi, RN 07/12/2017, 11:03 AM

## 2017-07-12 NOTE — Evaluation (Signed)
Physical Therapy Evaluation Patient Details Name: Anna [name] MRN: [id number] DOB: December 02, 1928 Today's Date: 07/12/2017   History of Present Illness  Pt admitted with coccyx abscess (per daughter, had received 2nd steroid injection for pain control 10/29), underwent sacral I&D 11/20. PMH: Alzheimer's dementia, HTN. DM, CHF, afib, thyroid mass  Clinical Impression  Pt admitted with above diagnosis. Pt currently with functional limitations due to the deficits listed below (see PT Problem List).  Pt very restless in bed on arrival and attempting to assist with mobility with provided multimodal cues.  Daughter reports she assists pt with her mobility at home.  Daughter encourages taking a few steps with RW prior to utilizing w/c for mobility.  Daughter also reports engaging pt in exercises at home.  Pt will benefit from skilled PT to increase their independence and safety with mobility to allow discharge to the venue listed below.   Daughter plans to take pt home upon d/c and agreeable to home health services.  She also requests hospital bed for home.      Follow Up Recommendations Home health PT;Supervision/Assistance - 24 hour    Equipment Recommendations  Hospital bed    Recommendations for Other Services       Precautions / Restrictions Precautions Precautions: Fall Precaution Comments: urinary incontinence, s/p sacral I&D Restrictions Weight Bearing Restrictions: No      Mobility  Bed Mobility Overal bed mobility: Needs Assistance Bed Mobility: Supine to Sit;Sit to Supine;Rolling Rolling: Max assist   Supine to sit: +2 for physical assistance;Max assist Sit to supine: +2 for physical assistance;Max assist   General bed mobility comments: provided multimodal cues however pt requires assist  Transfers Overall transfer level: Needs assistance Equipment used: Rolling walker (2 wheeled) Transfers: Sit to/from Stand Sit to Stand: +2 physical assistance;Mod assist          General transfer comment: assist to rise and steady and maintain for 10 seconds or less, tactile cues for improving posture (tends to remain in flexed trunk posture), stood x 3  Ambulation/Gait                Stairs            Wheelchair Mobility    Modified Rankin (Stroke Patients Only)       Balance Overall balance assessment: Needs assistance Sitting-balance support: Bilateral upper extremity supported;Feet supported Sitting balance-Leahy Scale: Poor     Standing balance support: Bilateral upper extremity supported Standing balance-Leahy Scale: Poor                               Pertinent Vitals/Pain Pain Assessment: Faces Faces Pain Scale: Hurts whole lot Pain Location: sacral area Pain Descriptors / Indicators: Grimacing;Guarding;Restless Pain Intervention(s): Limited activity within patient's tolerance;Repositioned;Monitored during session(per daughter, RN to bring pain med after session)    Home Living Family/patient expects to be discharged to:: Private residence Living Arrangements: Children(daughter) Available Help at Discharge: Family;Available 24 hours/day Type of Home: House       Home Layout: Able to live on main level with bedroom/bathroom Home Equipment: Gilford Rile - 2 wheels;Bedside commode;Wheelchair - manual      Prior Function Level of Independence: Needs assistance   Gait / Transfers Assistance Needed: walks 4-5 steps with RW and assist, otherwise w/c dependent  ADL's / Homemaking Assistance Needed: Can self feed finger foods and some drinks, dependent in bathing, dressing, toileting        Hand  Dominance   Dominant Hand: Right    Extremity/Trunk Assessment   Upper Extremity Assessment Upper Extremity Assessment: Generalized weakness    Lower Extremity Assessment Lower Extremity Assessment: Generalized weakness    Cervical / Trunk Assessment Cervical / Trunk Assessment: (weakness)  Communication    Communication: Expressive difficulties(very little verbalizations)  Cognition Arousal/Alertness: Awake/alert Behavior During Therapy: Flat affect Overall Cognitive Status: History of cognitive impairments - at baseline                                 General Comments: pt with no verbalizations, does attempt to assist with mobility with cues, follows commands to her ability      General Comments      Exercises     Assessment/Plan    PT Assessment Patient needs continued PT services  PT Problem List Decreased mobility;Decreased strength;Decreased activity tolerance;Decreased balance;Decreased knowledge of use of DME       PT Treatment Interventions DME instruction;Therapeutic activities;Gait training;Therapeutic exercise;Functional mobility training;Patient/family education    PT Goals (Current goals can be found in the Care Plan section)  Acute Rehab PT Goals Patient Stated Goal: daughter wants to take pt home PT Goal Formulation: With patient/family Time For Goal Achievement: 07/26/17 Potential to Achieve Goals: Fair    Frequency Min 3X/week   Barriers to discharge        Co-evaluation PT/OT/SLP Co-Evaluation/Treatment: Yes Reason for Co-Treatment: For patient/therapist safety PT goals addressed during session: Mobility/safety with mobility OT goals addressed during session: Strengthening/ROM       AM-PAC PT "6 Clicks" Daily Activity  Outcome Measure Difficulty turning over in bed (including adjusting bedclothes, sheets and blankets)?: Unable Difficulty moving from lying on back to sitting on the side of the bed? : Unable Difficulty sitting down on and standing up from a chair with arms (e.g., wheelchair, bedside commode, etc,.)?: Unable Help needed moving to and from a bed to chair (including a wheelchair)?: A Lot Help needed walking in hospital room?: Total Help needed climbing 3-5 steps with a railing? : Total 6 Click Score: 7    End of Session    Activity Tolerance: Patient tolerated treatment well Patient left: with call bell/phone within reach;in bed;with bed alarm set;with nursing/sitter in room;with family/visitor present   PT Visit Diagnosis: Muscle weakness (generalized) (M62.81);Other abnormalities of gait and mobility (R26.89)    Time:  -      Charges:   PT Evaluation $PT Eval Moderate Complexity: 1 Mod     PT G Codes:        Carmelia Bake, PT, DPT 07/12/2017 Pager: [phone]  York Ram E 07/12/2017, 3:32 PM

## 2017-07-12 NOTE — Consult Note (Signed)
[facility] Nurse wound consult note Reason for Consult: S/P I&D Wound type:SUrgical, infectious Consult received for wound care.  Patient is being followed by [facility] s/p the surgical debridement.  [facility] PA today ordered twice daily NS dressings to be topped with dry dressing, an ABD pad and to secure with tape.   [facility] nursing team will not follow, but will remain available to this patient, the nursing and medical teams.  Please re-consult if needed. Thanks, Maudie Flakes, MSN, RN, [place], Arther Abbott  Pager# [phone]

## 2017-07-13 DIAGNOSIS — R05 Cough: Secondary | ICD-10-CM

## 2017-07-13 LAB — CREATININE, SERUM
CREATININE: 1.06 mg/dL — AB (ref 0.44–1.00)
GFR calc Af Amer: 54 mL/min — ABNORMAL LOW (ref >60)
GFR calc non Af Amer: 46 mL/min — ABNORMAL LOW (ref >60)

## 2017-07-13 LAB — GLUCOSE, CAPILLARY
GLUCOSE-CAPILLARY: 113 mg/dL — AB (ref 65–99)
GLUCOSE-CAPILLARY: 200 mg/dL — AB (ref 65–99)
Glucose-Capillary: 165 mg/dL — ABNORMAL HIGH (ref 65–99)

## 2017-07-13 MED ORDER — SENNOSIDES-DOCUSATE SODIUM 8.6-50 MG PO TABS
2.0000 | ORAL_TABLET | Freq: Two times a day (BID) | ORAL | Status: DC
  Administered 2017-07-13 – 2017-07-17 (×9): 2 via ORAL
  Filled 2017-07-13 (×9): qty 2

## 2017-07-13 MED ORDER — FUROSEMIDE 10 MG/ML IJ SOLN
20.0000 mg | Freq: Once | INTRAMUSCULAR | Status: AC
  Administered 2017-07-13: 20 mg via INTRAVENOUS
  Filled 2017-07-13: qty 2

## 2017-07-13 MED ORDER — POLYETHYLENE GLYCOL 3350 17 G PO PACK: 17.0000 g | PACK | Freq: Every day | ORAL | Status: DC | PRN

## 2017-07-13 NOTE — Progress Notes (Signed)
PROGRESS NOTE    Anna [name]  SHF:[id number] DOB: [birth year] DOA: 07/09/2017 PCP: Dorothyann Peng, NP    Brief Narrative: Anna [name] a 83 y.o.femalewith medical history significant ofAlzheimer's dementia. Patient has had 2 shots in sacrum last month for coccygodynia, last on 10/29, it was followed by worsening pain and a small draining wound area.  Also developed fever and was brought to ED for further evaluation.  She was admitted to medical service for evaluation of the abscesses of the coccygeal area.    Assessment & Plan:   Principal Problem:   Abscess of coccyx Active Problems:   Benign essential HTN   DM (diabetes mellitus) type II controlled with renal manifestation (HCC)   Acute metabolic encephalopathy   Alzheimer's dementia with behavioral disturbance   Pilonidal abscess   Abscess of the coccyx:  CT of the pelvis on admission shows small abscesses possible fistula of the left of coccyx, with focal loss of the sacral fat plane possible represent fistula to posterior rectum.  She was started on broad-spectrum antibiotics.  Blood cultures sent, have been negative so far.  Surgery consulted and she underwent I&D and wound cultures sent for analysis and they are growing enterococcus sensitive to ampicillin.  As per surgery there was no evidence of any fistula. Continue with the wound care daily dressing changes as per surgery recommendations, pain control, physical therapy, ambulate the patient out of bed.   Klebsiella urinary tract infection On broad-spectrum antibiotics to complete the course.    Chronic atrial fibrillation Currently on aspirin, follow-up as an outpatient with cardiology.    Hypertension well controlled continue metoprolol.   Stage III CKD Baseline creatinine appears to be around 1.1.  Avoid nephrotoxins.   Mild thrombocytopenia possibly secondary to infection continue to monitor.  No signs of bleeding.  Platelet count improving.  Recheck cbc in am.    Dementia/metabolic encephalopathy Possibly secondary to infection.  Resume home medications of donepezil.   Type 2 diabetes mellitus Holding home medications, continue with sensitive sliding scale for now. CBG (last 3)  Recent Labs    07/12/17 2112 07/13/17 0723 07/13/17 1142  GLUCAP 127* 113* 165*      History of chronic systolic heart failure Patient's last echo was done in January 2018, left ventricular ejection fraction 35% to 40%.  Continue Lasix.   Cough nonproductive, get repeat chest x-ray. Repeat chest x-ray shows mild interstitial edema, 1 dose of IV Lasix 20 mg given.  DVT prophylaxis: Lovenox Code Status: Full code Family Communication: Daughter at bedside Disposition Plan: Pending further evaluation by PT   Consultants:   Surgery   Procedures: Status post irrigation and debridement of the abscess on November 20 by Dr. Rosendo Gros  Antimicrobials: Vancomycin, cefepime , Flagyl  Subjective: Pain better controlled, urine for about a liter after the 20 mg of IV Lasix given  Objective: Vitals:   07/12/17 1604 07/12/17 2115 07/13/17 0441 07/13/17 1423  BP: 121/72 132/70 135/63 137/60  Pulse: 86 86 85 (!) 135  Resp: 17 16 18 18   Temp: 98 F (36.7 C) 97.9 F (36.6 C) 97.9 F (36.6 C) 98 F (36.7 C)  TempSrc: Oral Oral Oral Axillary  SpO2: 99% 98% 96% 96%  Weight:      Height:        Intake/Output Summary (Last 24 hours) at 07/13/2017 1435 Last data filed at 07/13/2017 0600 Gross per 24 hour  Intake 420 ml  Output 950 ml  Net -530 ml  Filed Weights   07/10/17 0149 07/10/17 0511  Weight: 80.7 kg (178 lb) 77.2 kg (170 lb 3.1 oz)    Examination:  General exam: Appears calm and comfortable  Respiratory system: Clear to auscultation. Respiratory effort normal.  No wheezing or rhonchi Cardiovascular system: S1 & S2 heard, RRR. No JVD No pedal edema. Gastrointestinal system: Abdomen is soft nontender, nondistended, bowel  sounds are good Central nervous system: Alert, both her extremities, noncommunicative. Extremities: Symmetric 5 x 5 power.  Pedal edema. Skin: sacral abscess, bandaged. No drainage.  Psychiatry:. Mood & affect appropriate.     Data Reviewed: I have personally reviewed following labs and imaging studies  CBC: Recent Labs  Lab 07/09/17 2241 07/10/17 1127 07/11/17 0534 07/12/17 0511  WBC 12.1* 11.7* 10.1 7.3  NEUTROABS 9.1*  --   --   --   HGB 14.8 13.5 13.2 13.1  HCT 44.3 41.3 40.2 40.1  MCV 94.9 95.4 95.3 97.1  PLT 133* 143* 102* 829*   Basic Metabolic Panel: Recent Labs  Lab 07/09/17 2241 07/10/17 1127 07/11/17 0534 07/12/17 0511 07/13/17 0529  NA 138 141 141 141  --   K 3.6 3.0* 3.6 4.2  --   CL 105 108 108 108  --   CO2 23 27 26 26   --   GLUCOSE 162* 117* 138* 95  --   BUN 18 14 16 14   --   CREATININE 1.07* 1.05* 1.15* 1.05* 1.06*  CALCIUM 9.2 8.9 9.0 9.0  --    GFR: Estimated Creatinine Clearance: 39.2 mL/min (A) (by C-G formula based on SCr of 1.06 mg/dL (H)). Liver Function Tests: Recent Labs  Lab 07/09/17 2241 07/10/17 1127  AST 16 21  ALT 13* 13*  ALKPHOS 69 72  BILITOT 1.0 1.8*  PROT 6.4* 5.9*  ALBUMIN 3.6 3.2*   No results for input(s): LIPASE, AMYLASE in the last 168 hours. No results for input(s): AMMONIA in the last 168 hours. Coagulation Profile: Recent Labs  Lab 07/10/17 1127  INR 1.13   Cardiac Enzymes: No results for input(s): CKTOTAL, CKMB, CKMBINDEX, TROPONINI in the last 168 hours. BNP (last 3 results) No results for input(s): PROBNP in the last 8760 hours. HbA1C: No results for input(s): HGBA1C in the last 72 hours. CBG: Recent Labs  Lab 07/12/17 1139 07/12/17 1641 07/12/17 2112 07/13/17 0723 07/13/17 1142  GLUCAP 135* 189* 127* 113* 165*   Lipid Profile: No results for input(s): CHOL, HDL, LDLCALC, TRIG, CHOLHDL, LDLDIRECT in the last 72 hours. Thyroid Function Tests: No results for input(s): TSH, T4TOTAL, FREET4,  T3FREE, THYROIDAB in the last 72 hours. Anemia Panel: No results for input(s): VITAMINB12, FOLATE, FERRITIN, TIBC, IRON, RETICCTPCT in the last 72 hours. Sepsis Labs: Recent Labs  Lab 07/09/17 2352  LATICACIDVEN 1.50    Recent Results (from the past 240 hour(s))  Culture, Urine     Status: Abnormal   Collection Time: 07/09/17 11:08 PM  Result Value Ref Range Status   Specimen Description URINE, CLEAN CATCH  Final   Special Requests NONE  Final   Culture >=100,000 COLONIES/mL KLEBSIELLA PNEUMONIAE (A)  Final   Report Status 07/12/2017 FINAL  Final   Organism ID, Bacteria KLEBSIELLA PNEUMONIAE (A)  Final      Susceptibility   Klebsiella pneumoniae - MIC*    AMPICILLIN >=32 RESISTANT Resistant     CEFAZOLIN <=4 SENSITIVE Sensitive     CEFTRIAXONE <=1 SENSITIVE Sensitive     CIPROFLOXACIN <=0.25 SENSITIVE Sensitive     GENTAMICIN <=1  SENSITIVE Sensitive     IMIPENEM <=0.25 SENSITIVE Sensitive     NITROFURANTOIN 32 SENSITIVE Sensitive     TRIMETH/SULFA <=20 SENSITIVE Sensitive     AMPICILLIN/SULBACTAM 4 SENSITIVE Sensitive     PIP/TAZO <=4 SENSITIVE Sensitive     Extended ESBL NEGATIVE Sensitive     * >=100,000 COLONIES/mL KLEBSIELLA PNEUMONIAE  Blood Culture (routine x 2)     Status: None (Preliminary result)   Collection Time: 07/09/17 11:28 PM  Result Value Ref Range Status   Specimen Description BLOOD RIGHT HAND  Final   Special Requests   Final    BOTTLES DRAWN AEROBIC AND ANAEROBIC Blood Culture adequate volume   Culture   Final    NO GROWTH 2 DAYS Performed at [facility], [place] [address] [place], [place] [address]    Report Status PENDING  Incomplete  Wound or Superficial Culture     Status: None (Preliminary result)   Collection Time: 07/09/17 11:29 PM  Result Value Ref Range Status   Specimen Description SACRAL  Final   Special Requests NONE  Final   Gram Stain   Final    RARE WBC PRESENT, PREDOMINANTLY MONONUCLEAR ABUNDANT GRAM POSITIVE COCCI IN  PAIRS MODERATE GRAM NEGATIVE RODS    Culture   Final    ABUNDANT ENTEROCOCCUS FAECALIS CULTURE REINCUBATED FOR BETTER GROWTH Performed at [facility], [place] [address], [place], [place] [address]    Report Status PENDING  Incomplete   Organism ID, Bacteria ENTEROCOCCUS FAECALIS  Final      Susceptibility   Enterococcus faecalis - MIC*    AMPICILLIN <=2 SENSITIVE Sensitive     VANCOMYCIN 1 SENSITIVE Sensitive     GENTAMICIN SYNERGY SENSITIVE Sensitive     * ABUNDANT ENTEROCOCCUS FAECALIS  Blood Culture (routine x 2)     Status: None (Preliminary result)   Collection Time: 07/09/17 11:33 PM  Result Value Ref Range Status   Specimen Description BLOOD LEFT HAND  Final   Special Requests   Final    BOTTLES DRAWN AEROBIC AND ANAEROBIC Blood Culture adequate volume   Culture   Final    NO GROWTH 2 DAYS Performed at [facility], [place] [address], [place], [place] [address]    Report Status PENDING  Incomplete  Surgical PCR screen     Status: None   Collection Time: 07/10/17  9:51 PM  Result Value Ref Range Status   MRSA, PCR NEGATIVE NEGATIVE Final   Staphylococcus aureus NEGATIVE NEGATIVE Final    Comment: (NOTE) The Xpert SA Assay (FDA approved for NASAL specimens in patients 65 years of age and older), is one component of a comprehensive surveillance program. It is not intended to diagnose infection nor to guide or monitor treatment.   Aerobic/Anaerobic Culture (surgical/deep wound)     Status: None (Preliminary result)   Collection Time: 07/11/17 12:47 PM  Result Value Ref Range Status   Specimen Description BONE  Final   Special Requests NONE  Final   Gram Stain   Final    RARE WBC PRESENT, PREDOMINANTLY PMN RARE GRAM POSITIVE COCCI    Culture   Final    FEW UNIDENTIFIED ORGANISM HOLDING FOR POSSIBLE ANAEROBE Performed at [facility], [place] [address], [place], [place] [address]    Report Status PENDING  Incomplete  Aerobic/Anaerobic Culture  (surgical/deep wound)     Status: None (Preliminary result)   Collection Time: 07/11/17 12:50 PM  Result Value Ref Range Status  Specimen Description WOUND SACRAL  Final   Special Requests NONE  Final   Gram Stain   Final    RARE WBC PRESENT,BOTH PMN AND MONONUCLEAR NO ORGANISMS SEEN    Culture   Final    CULTURE REINCUBATED FOR BETTER GROWTH HOLDING FOR POSSIBLE ANAEROBE Performed at [facility], [place] [address] [place], [place] [address]    Report Status PENDING  Incomplete  Aerobic Culture (superficial specimen)     Status: None (Preliminary result)   Collection Time: 07/11/17 12:50 PM  Result Value Ref Range Status   Specimen Description WOUND SACRAL  Final   Special Requests NONE  Final   Gram Stain   Final    NO WBC SEEN NO ORGANISMS SEEN Performed at [facility], [address] [place],  [address]    Culture FEW VIRIDANS STREPTOCOCCUS  Final   Report Status PENDING  Incomplete         Radiology Studies: Dg Chest 2 View  Result Date: 07/12/2017 CLINICAL DATA:  Worsening cough and shortness of breath. EXAM: CHEST  2 VIEW COMPARISON:  07/09/2017 FINDINGS: AP and lateral views of the chest show low volumes. The cardio pericardial silhouette is enlarged. Vascular congestion with interstitial pulmonary edema pattern. Bones are diffusely demineralized. Telemetry leads overlie the chest. IMPRESSION: Similar appearance of cardiomegaly and apparent interstitial pulmonary edema. Electronically Signed   By: Misty Stanley M.D.   On: 07/12/2017 15:41        Scheduled Meds: . calcitRIOL  0.5 mcg Oral Once per day on Mon Wed Fri  . donepezil  10 mg Oral Daily  . enoxaparin (LOVENOX) injection  40 mg Subcutaneous Q24H  . fluticasone  2 spray Each Nare Daily  . furosemide  30 mg Oral Daily  . insulin aspart  0-9 Units Subcutaneous TID WC  . lidocaine (PF)  2 mL Other Once  . loratadine  5 mg Oral Daily  . memantine  5 mg Oral QHS  . metoprolol  tartrate  12.5 mg Oral BID  . mirabegron ER  25 mg Oral Daily  . multivitamins with iron  1 tablet Oral Weekly  . pantoprazole  40 mg Oral Daily  . senna-docusate  2 tablet Oral BID  . vitamin B-12  500 mcg Oral Daily   Continuous Infusions: . ceFEPime (MAXIPIME) IV Stopped (07/13/17 0558)  . metronidazole Stopped (07/13/17 1123)  . vancomycin Stopped (07/13/17 0950)     LOS: 3 days    Time spent: 40 minutes.     Hosie Poisson, MD [facility] Pager [phone]  If 7PM-7AM, please contact night-coverage [email or web address] Password [facility] 07/13/2017, 2:35 PM

## 2017-07-13 NOTE — Progress Notes (Signed)
Patient ID: Anna [name], female   DOB: 11/30/1928, 83 y.o.   MRN: [id number] 2 Days Post-Op   Subjective: Patient essentially nonverbal. Daughter states that she has been doing well with just some slight tenderness.  Objective: Vital signs in last 24 hours: Temp:  [97.9 F (36.6 C)-98 F (36.7 C)] 97.9 F (36.6 C) (11/22 0441) Pulse Rate:  [85-86] 85 (11/22 0441) Resp:  [16-18] 18 (11/22 0441) BP: (121-135)/(63-72) 135/63 (11/22 0441) SpO2:  [96 %-99 %] 96 % (11/22 0441) Last BM Date: 07/09/17  Intake/Output from previous day: 11/21 0701 - 11/22 0700 In: 810 [P.O.:410; IV Piggyback:400] Out: 950 [Urine:950] Intake/Output this shift: No intake/output data recorded.  General appearance: alert and no distress Incision/Wound: 1 cm open wound. Some eschar and base likely over coccyx. No unusual drainage.  Lab Results:  Recent Labs    07/11/17 0534 07/12/17 0511  WBC 10.1 7.3  HGB 13.2 13.1  HCT 40.2 40.1  PLT 102* 123*   BMET Recent Labs    07/11/17 0534 07/12/17 0511 07/13/17 0529  NA 141 141  --   K 3.6 4.2  --   CL 108 108  --   CO2 26 26  --   GLUCOSE 138* 95  --   BUN 16 14  --   CREATININE 1.15* 1.05* 1.06*  CALCIUM 9.0 9.0  --    Recent Results (from the past 240 hour(s))  Culture, Urine     Status: Abnormal   Collection Time: 07/09/17 11:08 PM  Result Value Ref Range Status   Specimen Description URINE, CLEAN CATCH  Final   Special Requests NONE  Final   Culture >=100,000 COLONIES/mL KLEBSIELLA PNEUMONIAE (A)  Final   Report Status 07/12/2017 FINAL  Final   Organism ID, Bacteria KLEBSIELLA PNEUMONIAE (A)  Final      Susceptibility   Klebsiella pneumoniae - MIC*    AMPICILLIN >=32 RESISTANT Resistant     CEFAZOLIN <=4 SENSITIVE Sensitive     CEFTRIAXONE <=1 SENSITIVE Sensitive     CIPROFLOXACIN <=0.25 SENSITIVE Sensitive     GENTAMICIN <=1 SENSITIVE Sensitive     IMIPENEM <=0.25 SENSITIVE Sensitive     NITROFURANTOIN 32 SENSITIVE Sensitive      TRIMETH/SULFA <=20 SENSITIVE Sensitive     AMPICILLIN/SULBACTAM 4 SENSITIVE Sensitive     PIP/TAZO <=4 SENSITIVE Sensitive     Extended ESBL NEGATIVE Sensitive     * >=100,000 COLONIES/mL KLEBSIELLA PNEUMONIAE  Blood Culture (routine x 2)     Status: None (Preliminary result)   Collection Time: 07/09/17 11:28 PM  Result Value Ref Range Status   Specimen Description BLOOD RIGHT HAND  Final   Special Requests   Final    BOTTLES DRAWN AEROBIC AND ANAEROBIC Blood Culture adequate volume   Culture   Final    NO GROWTH 2 DAYS Performed at [facility], [address], [place], [place] [address]    Report Status PENDING  Incomplete  Wound or Superficial Culture     Status: None (Preliminary result)   Collection Time: 07/09/17 11:29 PM  Result Value Ref Range Status   Specimen Description SACRAL  Final   Special Requests NONE  Final   Gram Stain   Final    RARE WBC PRESENT, PREDOMINANTLY MONONUCLEAR ABUNDANT GRAM POSITIVE COCCI IN PAIRS MODERATE GRAM NEGATIVE RODS    Culture   Final    CULTURE REINCUBATED FOR BETTER GROWTH Performed at [facility], [place],  [place] [address]    Report Status PENDING  Incomplete  Blood Culture (routine x 2)     Status: None (Preliminary result)   Collection Time: 07/09/17 11:33 PM  Result Value Ref Range Status   Specimen Description BLOOD LEFT HAND  Final   Special Requests   Final    BOTTLES DRAWN AEROBIC AND ANAEROBIC Blood Culture adequate volume   Culture   Final    NO GROWTH 2 DAYS Performed at [facility], [place] [address], [place], [place] [address]    Report Status PENDING  Incomplete  Surgical PCR screen     Status: None   Collection Time: 07/10/17  9:51 PM  Result Value Ref Range Status   MRSA, PCR NEGATIVE NEGATIVE Final   Staphylococcus aureus NEGATIVE NEGATIVE Final    Comment: (NOTE) The Xpert SA Assay (FDA approved for NASAL specimens in patients 66 years of age and older), is one component  of a comprehensive surveillance program. It is not intended to diagnose infection nor to guide or monitor treatment.   Aerobic/Anaerobic Culture (surgical/deep wound)     Status: None (Preliminary result)   Collection Time: 07/11/17 12:47 PM  Result Value Ref Range Status   Specimen Description BONE  Final   Special Requests NONE  Final   Gram Stain   Final    RARE WBC PRESENT, PREDOMINANTLY PMN RARE GRAM POSITIVE COCCI Performed at [facility], [place] [address] [place], [place] [address]    Culture PENDING  Incomplete   Report Status PENDING  Incomplete  Aerobic/Anaerobic Culture (surgical/deep wound)     Status: None (Preliminary result)   Collection Time: 07/11/17 12:50 PM  Result Value Ref Range Status   Specimen Description WOUND SACRAL  Final   Special Requests NONE  Final   Gram Stain   Final    RARE WBC PRESENT,BOTH PMN AND MONONUCLEAR NO ORGANISMS SEEN Performed at [facility], [place] [address], [place], [place] [address]    Culture PENDING  Incomplete   Report Status PENDING  Incomplete  Aerobic Culture (superficial specimen)     Status: None (Preliminary result)   Collection Time: 07/11/17 12:50 PM  Result Value Ref Range Status   Specimen Description WOUND SACRAL  Final   Special Requests NONE  Final   Gram Stain NO WBC SEEN NO ORGANISMS SEEN   Final   Culture   Final    CULTURE REINCUBATED FOR BETTER GROWTH Performed at [facility], [address], [place],  [place] [address]    Report Status PENDING  Incomplete    Studies/Results: Dg Chest 2 View  Result Date: 07/12/2017 CLINICAL DATA:  Worsening cough and shortness of breath. EXAM: CHEST  2 VIEW COMPARISON:  07/09/2017 FINDINGS: AP and lateral views of the chest show low volumes. The cardio pericardial silhouette is enlarged. Vascular congestion with interstitial pulmonary edema pattern. Bones are diffusely demineralized. Telemetry leads overlie the chest. IMPRESSION: Similar appearance  of cardiomegaly and apparent interstitial pulmonary edema. Electronically Signed   By: Misty Stanley M.D.   On: 07/12/2017 15:41    Anti-infectives: Anti-infectives (From admission, onward)   Start     Dose/Rate Route Frequency Ordered Stop   07/13/17 0800  vancomycin (VANCOCIN) 1,250 mg in sodium chloride 0.9 % 250 mL IVPB     1,250 mg 166.7 mL/hr over 90 Minutes Intravenous Every 48 hours 07/11/17 0848     07/11/17 1800  ceFEPIme (MAXIPIME) 1 g in dextrose 5 %  50 mL IVPB     1 g 100 mL/hr over 30 Minutes Intravenous Every 12 hours 07/11/17 1122     07/11/17 1115  metroNIDAZOLE (FLAGYL) IVPB 500 mg     500 mg 100 mL/hr over 60 Minutes Intravenous Every 8 hours 07/11/17 1100     07/11/17 0400  vancomycin (VANCOCIN) IVPB 750 mg/150 ml premix  Status:  Discontinued     750 mg 150 mL/hr over 60 Minutes Intravenous Every 24 hours 07/10/17 0318 07/11/17 0848   07/10/17 0800  piperacillin-tazobactam (ZOSYN) IVPB 3.375 g  Status:  Discontinued     3.375 g 12.5 mL/hr over 240 Minutes Intravenous Every 8 hours 07/10/17 0318 07/11/17 1100   07/10/17 0200  piperacillin-tazobactam (ZOSYN) IVPB 3.375 g  Status:  Discontinued     3.375 g 100 mL/hr over 30 Minutes Intravenous  Once 07/10/17 0145 07/10/17 0146   07/10/17 0200  vancomycin (VANCOCIN) IVPB 1000 mg/200 mL premix  Status:  Discontinued     1,000 mg 200 mL/hr over 60 Minutes Intravenous  Once 07/10/17 0145 07/10/17 0146   07/10/17 0200  vancomycin (VANCOCIN) 1,500 mg in sodium chloride 0.9 % 500 mL IVPB     1,500 mg 250 mL/hr over 120 Minutes Intravenous  Once 07/10/17 0150 07/10/17 0400   07/10/17 0200  piperacillin-tazobactam (ZOSYN) IVPB 3.375 g     3.375 g 100 mL/hr over 30 Minutes Intravenous  Once 07/10/17 0151 07/10/17 0326      Assessment/Plan: s/p Procedure(s): IRRIGATION AND DEBRIDEMENT OF SACRAL ABSCESS Stable postoperatively. Tissue cultures pending. No evidence of infection on broad-spectrum antibiotics. Continue  current wound care. We will see as needed over weekend and recheck on Monday.   LOS: 3 days    Edward Jolly 07/13/2017

## 2017-07-14 LAB — CREATININE, SERUM
Creatinine, Ser: 1.2 mg/dL — ABNORMAL HIGH (ref 0.44–1.00)
GFR calc Af Amer: 46 mL/min — ABNORMAL LOW (ref >60)
GFR, EST NON AFRICAN AMERICAN: 40 mL/min — AB (ref >60)

## 2017-07-14 LAB — CBC
HEMATOCRIT: 40 % (ref 36.0–46.0)
HEMOGLOBIN: 12.8 g/dL (ref 12.0–15.0)
MCH: 30.8 pg (ref 26.0–34.0)
MCHC: 32 g/dL (ref 30.0–36.0)
MCV: 96.2 fL (ref 78.0–100.0)
Platelets: 164 10*3/uL (ref 150–400)
RBC: 4.16 MIL/uL (ref 3.87–5.11)
RDW: 13.7 % (ref 11.5–15.5)
WBC: 6.4 10*3/uL (ref 4.0–10.5)

## 2017-07-14 LAB — GLUCOSE, CAPILLARY
GLUCOSE-CAPILLARY: 216 mg/dL — AB (ref 65–99)
GLUCOSE-CAPILLARY: 183 mg/dL — AB (ref 65–99)
GLUCOSE-CAPILLARY: 203 mg/dL — AB (ref 65–99)
Glucose-Capillary: 170 mg/dL — ABNORMAL HIGH (ref 65–99)

## 2017-07-14 LAB — AEROBIC CULTURE W GRAM STAIN (SUPERFICIAL SPECIMEN): Gram Stain: NONE SEEN

## 2017-07-14 LAB — AEROBIC CULTURE  (SUPERFICIAL SPECIMEN)

## 2017-07-14 MED ORDER — SODIUM CHLORIDE 0.9 % IV SOLN
3.0000 g | Freq: Four times a day (QID) | INTRAVENOUS | Status: DC
  Administered 2017-07-14 – 2017-07-17 (×12): 3 g via INTRAVENOUS
  Filled 2017-07-14 (×13): qty 3

## 2017-07-14 MED ORDER — METOPROLOL TARTRATE 5 MG/5ML IV SOLN
5.0000 mg | Freq: Once | INTRAVENOUS | Status: AC
  Administered 2017-07-14: 5 mg via INTRAVENOUS
  Filled 2017-07-14: qty 5

## 2017-07-14 NOTE — Progress Notes (Signed)
IV Lopressor 5 my given per new orders. Heart Rate 120s with BP 142/76. Will monitor Pt closely and maintain current plan of care.

## 2017-07-14 NOTE — Progress Notes (Signed)
Pt's Heart rate 125-135 on telemetry. BP 145/72. No other acute changes noted with pt's assessment. Pt sitting up in bed eating soup fed by Daughter. Dr Karleen Hampshire making rounds and made aware. New orders to be placed.

## 2017-07-14 NOTE — Progress Notes (Signed)
Pharmacy - IV Unasyn  Assessment:    Please see note from Dia Sitter, PharmD earlier today for full details.  Briefly, 83 y.o. female on Vancomycin and Cefepime for coccygeal abscess and osteo. ID recommending transition to Unasyn today   SCr 1.2, CrCl 35 ml/min  Plan:   IV Unasyn 3g IV q6 hr  F/u SCr, adjustment if < 30 ml/hr  Reuel Boom, PharmD, BCPS Pager: [phone] 07/14/2017, 2:21 PM

## 2017-07-14 NOTE — Progress Notes (Signed)
Pharmacy Antibiotic Note  Anna [name] is a 83 y.o. female with AMS and fever admitted on 07/09/2017 with coccyx abscess and possible osteomyelitis.  Pharmacy has been consulted for zosyn/vancomycin dosing for pylonidal abscess vs osteo of coccyx.  Zosyn changed to cefepime + metronidazole to reduce risk of nephrotoxicity.  Today, 07/14/2017  - day #5 abx - afeb, wbc wnl - scr up 1.20 (crcl~34) - wound and bone cx with strep viridan, enterococcus; ucx with klebsiella - 11/19 pelvis CT: small abscess and fistula in soft tissues w/ possible focal OM in adjacent coccyx - 11/20: I&D sacral wound/abscess   Plan: - Continue vancomycin to 1250mg  IV q48h for now - f/u scr and adjust vancomycin dose if continues to trend up - cefepime 1gm IV q12h and flagyl 500 mg IV q8h - F/u cultures 1gm IV q12h  - if appropriate, consider narrowing cefepime to ceftriaxone (this will cover for strep viridans and klebsiella) ______________________________________  Height: 5\' 5"  (165.1 cm) Weight: 170 lb 3.1 oz (77.2 kg) IBW/kg (Calculated) : 57  Temp (24hrs), Avg:98.4 F (36.9 C), Min:97.9 F (36.6 C), Max:99.2 F (37.3 C)  Recent Labs  Lab 07/09/17 2241 07/09/17 2352 07/10/17 1127 07/11/17 0534 07/12/17 0511 07/13/17 0529 07/14/17 0501  WBC 12.1*  --  11.7* 10.1 7.3  --  6.4  CREATININE 1.07*  --  1.05* 1.15* 1.05* 1.06* 1.20*  LATICACIDVEN  --  1.50  --   --   --   --   --     Estimated Creatinine Clearance: 34.6 mL/min (A) (by C-G formula based on SCr of 1.2 mg/dL (H)).    Allergies  Allergen Reactions  . Pollen Extract     Sneezing, watery eyes   Antimicrobials this admission: 11/19 zosyn >> 11/20 11/19 vancomycin >>  11/20 metronidazole >> 11/20 cefepime >>  Dose adjustments this admission: 11/20 vanco 750mg  IV q24h to 1250mg  IV q48h for bump in Scr  Microbiology results: 11/18 UCx: K. Pneumoniae (R to amp only) FINAL 11/18 BCx x2: NGTD 11/18 sacral wound: abundant GPC  pairs, mod GNR; enterococcus faecalis (pans sens) 11/20 bone cx (OR): few strep viridan 11/20 Sacral wound (OR): few strep viridan 11/20 sacral wound  #2 (OR): few strep viridan   Thank you for allowing pharmacy to be a part of this patient's care.  Dia Sitter, PharmD, BCPS 07/14/2017 8:21 AM

## 2017-07-14 NOTE — Progress Notes (Signed)
    Durable Medical Equipment  (From admission, onward)        Start     Ordered   07/14/17 1526  For home use only DME 3 n 1  Once     07/14/17 1525   07/14/17 1526  For home use only DME Hospital bed  Once    Question Answer Comment  Patient has (list medical condition): abscess of coccyx   The above medical condition requires: Patient requires the ability to reposition frequently   Head must be elevated greater than: 30 degrees   Bed type Semi-electric   Support Surface: Gel Overlay      07/14/17 1527

## 2017-07-14 NOTE — Care Management Note (Signed)
Case Management Note  Patient Details  Name: Anna [name] MRN: [id number] Date of Birth: [birth year]  Subjective/Objective: PT/OT recc HHC. Spoke to dtr in rm about d/c plans-chose [facility]-rep Brad aware of HHC/dme-hospital bed must arrive in home prior to patient d/c. May need ambulance transp @ d/c.                   Action/Plan:d/c plan home w/HHC/dme.   Expected Discharge Date:                  Expected Discharge Plan:  [place]  In-House Referral:     Discharge planning Services  CM Consult  Post Acute Care Choice:    Choice offered to:  Adult Children  DME Arranged:  3-N-1, Hospital bed DME Agency:  [place]:  RN, PT, OT [facility] Agency:  [place]  Status of Service:  In process, will continue to follow  If discussed at Long Length of Stay Meetings, dates discussed:    Additional Comments:  Dessa Phi, RN 07/14/2017, 3:30 PM

## 2017-07-14 NOTE — Progress Notes (Signed)
PROGRESS NOTE    Anna [name]  GGY:[id number] DOB: March 07, 1929 DOA: 07/09/2017 PCP: Dorothyann Peng, NP    Brief Narrative: Anna [name] Meadowsis a 83 y.o.femalewith medical history significant ofAlzheimer's dementia. Patient has had 2 shots in sacrum last month for coccygodynia, last on 10/29, it was followed by worsening pain and a small draining wound area.  Also developed fever and was brought to ED for further evaluation.  She was admitted to medical service for evaluation of the abscesses of the coccygeal area.    Assessment & Plan:   Principal Problem:   Abscess of coccyx Active Problems:   Benign essential HTN   DM (diabetes mellitus) type II controlled with renal manifestation (HCC)   Acute metabolic encephalopathy   Alzheimer's dementia with behavioral disturbance   Pilonidal abscess   Abscess of the coccyx:  CT of the pelvis on admission shows small abscesses possible fistula of the left of coccyx, with focal loss of the sacral fat plane possible represent fistula to posterior rectum.  She was started on broad-spectrum antibiotics.  Blood cultures sent, have been negative so far.  Surgery consulted and she underwent I&D and wound cultures sent for analysis and they are growing enterococcus sensitive to ampicillin.  As per surgery there was no evidence of any fistula. Continue with the wound care daily dressing changes as per surgery recommendations, pain control, physical therapy, ambulate the patient out of bed. Bone biopsy growing viridans streptococcus sensitive to penicillins.  Discussed with Dr hatcher recommended 2 weeks of IV Unasyn followed by 2 more months of Augmentin to complete the course.    Klebsiella urinary tract infection On broad-spectrum antibiotics to complete the course.  Currently asymptomatic.    Chronic atrial fibrillation Currently on aspirin, follow-up as an outpatient with cardiology.    Hypertension well controlled continue  metoprolol. No change in meds    mild acute on Stage III CKD; Possibly from vancomycin . D/c IV vancomycin.  Baseline creatinine appears to be around 1.1.  Avoid nephrotoxins.   Mild thrombocytopenia possibly secondary to infection continue to monitor.  No signs of bleeding.  Platelet count improving. Recheck cbc in am shows normal platelets.    Dementia/metabolic encephalopathy Possibly secondary to infection.  Resume home medications of donepezil.   Type 2 diabetes mellitus Holding home medications, continue with sensitive sliding scale for now. CBG (last 3)  Recent Labs    07/13/17 1741 07/14/17 0756 07/14/17 1153  GLUCAP 200* 170* 216*  no change in meds.     History of chronic systolic heart failure Patient's last echo was done in January 2018, left ventricular ejection fraction 35% to 40%.  Continue Lasix.   Cough nonproductive, get repeat chest x-ray. Repeat chest x-ray shows mild interstitial edema, 1 dose of IV Lasix 20 mg given. One xam no more rales or rhonchi.  Resume home meds of lasix 30 mg daily.   DVT prophylaxis: Lovenox Code Status: Full code Family Communication: Daughter at bedside Disposition Plan: Pending further evaluation by PT   Consultants:   Surgery   Procedures: Status post irrigation and debridement of the abscess on November 20 by Dr. Rosendo Gros  Antimicrobials: Vancomycin, cefepime , Flagyl  Subjective: No new complaints. No chest pain or sob.   Objective: Vitals:   07/13/17 2024 07/14/17 0424 07/14/17 0947 07/14/17 1352  BP: (!) 147/68 (!) 143/85 135/80 (!) 154/75  Pulse: 92 94 90 92  Resp: 18 18  18   Temp: 99.2 F (37.3 C) 97.9 F (  36.6 C)  98.7 F (37.1 C)  TempSrc: Axillary Axillary  Axillary  SpO2: 98% 94%  92%  Weight:      Height:        Intake/Output Summary (Last 24 hours) at 07/14/2017 1400 Last data filed at 07/14/2017 0910 Gross per 24 hour  Intake 860 ml  Output 200 ml  Net 660 ml   Filed Weights    07/10/17 0149 07/10/17 0511  Weight: 80.7 kg (178 lb) 77.2 kg (170 lb 3.1 oz)    Examination:  General exam: Appears calm and comfortable , not in any distress.  Respiratory system: air entry fair. No wheezing or rhonchi.  Cardiovascular system: S1 & S2 heard, RRR. No JVD No pedal edema. Gastrointestinal system: Abdomen is soft non tender non distended bowel sounds heard.  Central nervous system: Alert, able to more her extremities,  But noncommunicative. Extremities: Symmetric 5 x 5 power. No  Pedal edema. Skin: sacral abscess, bandaged. No drainage.  Psychiatry:. Mood & affect appropriate.     Data Reviewed: I have personally reviewed following labs and imaging studies  CBC: Recent Labs  Lab 07/09/17 2241 07/10/17 1127 07/11/17 0534 07/12/17 0511 07/14/17 0501  WBC 12.1* 11.7* 10.1 7.3 6.4  NEUTROABS 9.1*  --   --   --   --   HGB 14.8 13.5 13.2 13.1 12.8  HCT 44.3 41.3 40.2 40.1 40.0  MCV 94.9 95.4 95.3 97.1 96.2  PLT 133* 143* 102* 123* 416   Basic Metabolic Panel: Recent Labs  Lab 07/09/17 2241 07/10/17 1127 07/11/17 0534 07/12/17 0511 07/13/17 0529 07/14/17 0501  NA 138 141 141 141  --   --   K 3.6 3.0* 3.6 4.2  --   --   CL 105 108 108 108  --   --   CO2 23 27 26 26   --   --   GLUCOSE 162* 117* 138* 95  --   --   BUN 18 14 16 14   --   --   CREATININE 1.07* 1.05* 1.15* 1.05* 1.06* 1.20*  CALCIUM 9.2 8.9 9.0 9.0  --   --    GFR: Estimated Creatinine Clearance: 34.6 mL/min (A) (by C-G formula based on SCr of 1.2 mg/dL (H)). Liver Function Tests: Recent Labs  Lab 07/09/17 2241 07/10/17 1127  AST 16 21  ALT 13* 13*  ALKPHOS 69 72  BILITOT 1.0 1.8*  PROT 6.4* 5.9*  ALBUMIN 3.6 3.2*   No results for input(s): LIPASE, AMYLASE in the last 168 hours. No results for input(s): AMMONIA in the last 168 hours. Coagulation Profile: Recent Labs  Lab 07/10/17 1127  INR 1.13   Cardiac Enzymes: No results for input(s): CKTOTAL, CKMB, CKMBINDEX, TROPONINI  in the last 168 hours. BNP (last 3 results) No results for input(s): PROBNP in the last 8760 hours. HbA1C: No results for input(s): HGBA1C in the last 72 hours. CBG: Recent Labs  Lab 07/13/17 0723 07/13/17 1142 07/13/17 1741 07/14/17 0756 07/14/17 1153  GLUCAP 113* 165* 200* 170* 216*   Lipid Profile: No results for input(s): CHOL, HDL, LDLCALC, TRIG, CHOLHDL, LDLDIRECT in the last 72 hours. Thyroid Function Tests: No results for input(s): TSH, T4TOTAL, FREET4, T3FREE, THYROIDAB in the last 72 hours. Anemia Panel: No results for input(s): VITAMINB12, FOLATE, FERRITIN, TIBC, IRON, RETICCTPCT in the last 72 hours. Sepsis Labs: Recent Labs  Lab 07/09/17 2352  LATICACIDVEN 1.50    Recent Results (from the past 240 hour(s))  Culture, Urine  Status: Abnormal   Collection Time: 07/09/17 11:08 PM  Result Value Ref Range Status   Specimen Description URINE, CLEAN CATCH  Final   Special Requests NONE  Final   Culture >=100,000 COLONIES/mL KLEBSIELLA PNEUMONIAE (A)  Final   Report Status 07/12/2017 FINAL  Final   Organism ID, Bacteria KLEBSIELLA PNEUMONIAE (A)  Final      Susceptibility   Klebsiella pneumoniae - MIC*    AMPICILLIN >=32 RESISTANT Resistant     CEFAZOLIN <=4 SENSITIVE Sensitive     CEFTRIAXONE <=1 SENSITIVE Sensitive     CIPROFLOXACIN <=0.25 SENSITIVE Sensitive     GENTAMICIN <=1 SENSITIVE Sensitive     IMIPENEM <=0.25 SENSITIVE Sensitive     NITROFURANTOIN 32 SENSITIVE Sensitive     TRIMETH/SULFA <=20 SENSITIVE Sensitive     AMPICILLIN/SULBACTAM 4 SENSITIVE Sensitive     PIP/TAZO <=4 SENSITIVE Sensitive     Extended ESBL NEGATIVE Sensitive     * >=100,000 COLONIES/mL KLEBSIELLA PNEUMONIAE  Blood Culture (routine x 2)     Status: None (Preliminary result)   Collection Time: 07/09/17 11:28 PM  Result Value Ref Range Status   Specimen Description BLOOD RIGHT HAND  Final   Special Requests   Final    BOTTLES DRAWN AEROBIC AND ANAEROBIC Blood Culture  adequate volume   Culture   Final    NO GROWTH 3 DAYS Performed at [facility], [address], [place], [place] [address]    Report Status PENDING  Incomplete  Wound or Superficial Culture     Status: None (Preliminary result)   Collection Time: 07/09/17 11:29 PM  Result Value Ref Range Status   Specimen Description SACRAL  Final   Special Requests NONE  Final   Gram Stain   Final    RARE WBC PRESENT, PREDOMINANTLY MONONUCLEAR ABUNDANT GRAM POSITIVE COCCI IN PAIRS MODERATE GRAM NEGATIVE RODS    Culture   Final    ABUNDANT ENTEROCOCCUS FAECALIS CULTURE REINCUBATED FOR BETTER GROWTH Performed at [facility], [place] [address] [place], [place] [address]    Report Status PENDING  Incomplete   Organism ID, Bacteria ENTEROCOCCUS FAECALIS  Final      Susceptibility   Enterococcus faecalis - MIC*    AMPICILLIN <=2 SENSITIVE Sensitive     VANCOMYCIN 1 SENSITIVE Sensitive     GENTAMICIN SYNERGY SENSITIVE Sensitive     * ABUNDANT ENTEROCOCCUS FAECALIS  Blood Culture (routine x 2)     Status: None (Preliminary result)   Collection Time: 07/09/17 11:33 PM  Result Value Ref Range Status   Specimen Description BLOOD LEFT HAND  Final   Special Requests   Final    BOTTLES DRAWN AEROBIC AND ANAEROBIC Blood Culture adequate volume   Culture   Final    NO GROWTH 3 DAYS Performed at [facility], [place] [address] [place], [place] [address]    Report Status PENDING  Incomplete  Surgical PCR screen     Status: None   Collection Time: 07/10/17  9:51 PM  Result Value Ref Range Status   MRSA, PCR NEGATIVE NEGATIVE Final   Staphylococcus aureus NEGATIVE NEGATIVE Final    Comment: (NOTE) The Xpert SA Assay (FDA approved for NASAL specimens in patients 69 years of age and older), is one component of a comprehensive surveillance program. It is not intended to diagnose infection nor to guide or monitor treatment.   Aerobic/Anaerobic Culture (surgical/deep wound)     Status: None  (Preliminary result)  Collection Time: 07/11/17 12:47 PM  Result Value Ref Range Status   Specimen Description BONE  Final   Special Requests NONE  Final   Gram Stain   Final    RARE WBC PRESENT, PREDOMINANTLY PMN RARE GRAM POSITIVE COCCI    Culture   Final    FEW VIRIDANS STREPTOCOCCUS HOLDING FOR POSSIBLE ANAEROBE Performed at [facility], [place] [address] [place], [place] [address]    Report Status PENDING  Incomplete   Organism ID, Bacteria VIRIDANS STREPTOCOCCUS  Final      Susceptibility   Viridans streptococcus - MIC*    PENICILLIN <=0.06 SENSITIVE Sensitive     CEFTRIAXONE <=0.12 SENSITIVE Sensitive     ERYTHROMYCIN >=8 RESISTANT Resistant     LEVOFLOXACIN 1 SENSITIVE Sensitive     VANCOMYCIN 0.5 SENSITIVE Sensitive     * FEW VIRIDANS STREPTOCOCCUS  Aerobic/Anaerobic Culture (surgical/deep wound)     Status: None (Preliminary result)   Collection Time: 07/11/17 12:50 PM  Result Value Ref Range Status   Specimen Description WOUND SACRAL  Final   Special Requests NONE  Final   Gram Stain   Final    RARE WBC PRESENT,BOTH PMN AND MONONUCLEAR NO ORGANISMS SEEN    Culture   Final    FEW VIRIDANS STREPTOCOCCUS SUSCEPTIBILITIES PERFORMED ON PREVIOUS CULTURE WITHIN THE LAST 5 DAYS. HOLDING FOR POSSIBLE ANAEROBE Performed at [facility], [place] [address], [place], [place] [address]    Report Status PENDING  Incomplete  Aerobic Culture (superficial specimen)     Status: None   Collection Time: 07/11/17 12:50 PM  Result Value Ref Range Status   Specimen Description WOUND SACRAL  Final   Special Requests NONE  Final   Gram Stain NO WBC SEEN NO ORGANISMS SEEN   Final   Culture   Final    FEW VIRIDANS STREPTOCOCCUS SUSCEPTIBILITIES PERFORMED ON PREVIOUS CULTURE WITHIN THE LAST 5 DAYS. Performed at [facility], [place] [address], [place], [place] [address]    Report Status 07/14/2017 FINAL  Final         Radiology Studies: Dg Chest 2  View  Result Date: 07/12/2017 CLINICAL DATA:  Worsening cough and shortness of breath. EXAM: CHEST  2 VIEW COMPARISON:  07/09/2017 FINDINGS: AP and lateral views of the chest show low volumes. The cardio pericardial silhouette is enlarged. Vascular congestion with interstitial pulmonary edema pattern. Bones are diffusely demineralized. Telemetry leads overlie the chest. IMPRESSION: Similar appearance of cardiomegaly and apparent interstitial pulmonary edema. Electronically Signed   By: Misty Stanley M.D.   On: 07/12/2017 15:41        Scheduled Meds: . calcitRIOL  0.5 mcg Oral Once per day on Mon Wed Fri  . donepezil  10 mg Oral Daily  . enoxaparin (LOVENOX) injection  40 mg Subcutaneous Q24H  . fluticasone  2 spray Each Nare Daily  . furosemide  30 mg Oral Daily  . insulin aspart  0-9 Units Subcutaneous TID WC  . lidocaine (PF)  2 mL Other Once  . loratadine  5 mg Oral Daily  . memantine  5 mg Oral QHS  . metoprolol tartrate  12.5 mg Oral BID  . mirabegron ER  25 mg Oral Daily  . multivitamins with iron  1 tablet Oral Weekly  . pantoprazole  40 mg Oral Daily  . senna-docusate  2 tablet Oral BID  . vitamin B-12  500 mcg Oral Daily   Continuous Infusions: . ceFEPime (MAXIPIME) IV  Stopped (07/14/17 5053)  . metronidazole Stopped (07/14/17 1346)  . vancomycin Stopped (07/13/17 0950)     LOS: 4 days    Time spent: 40 minutes.     Hosie Poisson, MD [facility] Pager [phone]  If 7PM-7AM, please contact night-coverage [email or web address] Password [email or web address] 07/14/2017, 2:00 PM

## 2017-07-14 NOTE — Progress Notes (Signed)
OT Cancellation Note  Patient Details Name: BAYLEY YARBOROUGH MRN: [id number] DOB: 1929/02/20   Cancelled Treatment:    Reason Eval/Treat Not Completed: Other (comment). Spoke to PT.  Pt tired after working with PT. Will try to check back over the weekend, if schedule permits.  Merrick Maggio 07/14/2017, 2:44 PM  Lesle Chris, OTR/L [phone] 07/14/2017

## 2017-07-14 NOTE — Progress Notes (Signed)
Physical Therapy Treatment Patient Details Name: Anna [name] MRN: [id number] DOB: 1928/09/29 Today's Date: 07/14/2017    History of Present Illness Pt admitted with coccyx abscess (per daughter, had received 2nd steroid injection for pain control 10/29), underwent sacral I&D 11/20. PMH: Alzheimer's dementia, HTN. DM, CHF, afib, thyroid mass    PT Comments    Pt's daughter wanted to assist pt with sit to stands today, and pt had been premedicated with IV pain meds.  Pt very groggy and awakened for mobility however not able to participate as well as during previous visit.    Follow Up Recommendations  Home health PT;Supervision/Assistance - 24 hour     Equipment Recommendations  Hospital bed    Recommendations for Other Services       Precautions / Restrictions Precautions Precautions: Fall Precaution Comments: urinary incontinence, s/p sacral I&D    Mobility  Bed Mobility Overal bed mobility: Needs Assistance Bed Mobility: Supine to Sit;Sit to Supine;Rolling Rolling: Total assist;+2 for physical assistance   Supine to sit: Total assist;+2 for physical assistance Sit to supine: +2 for physical assistance   General bed mobility comments: pt requiring extensive assist   Transfers Overall transfer level: Needs assistance Equipment used: 2 person hand held assist Transfers: Sit to/from Stand Sit to Stand: Total assist;+2 physical assistance         General transfer comment: daughter assisting with transfers and attempting to assist pt to her feet however observed LEs buckling (had L knee blocked) and requested pt return to sitting for safety, attempted once more however pt still unable to take weight through LEs  Ambulation/Gait                 Stairs            Wheelchair Mobility    Modified Rankin (Stroke Patients Only)       Balance                                            Cognition Arousal/Alertness:  Lethargic;Suspect due to medications Behavior During Therapy: Flat affect Overall Cognitive Status: History of cognitive impairments - at baseline                                 General Comments: pt with no verbalizations, typically attempts to assist with mobility with cues, follows commands to her ability however, pt received IV pain meds prior to arrival and very groggy/not assisting as much       Exercises      General Comments        Pertinent Vitals/Pain Pain Assessment: Faces Faces Pain Scale: Hurts little more Pain Location: sacral area Pain Descriptors / Indicators: Grimacing;Guarding Pain Intervention(s): Repositioned;Monitored during session;Premedicated before session    Home Living                      Prior Function            PT Goals (current goals can now be found in the care plan section) Progress towards PT goals: Not progressing toward goals - comment(very groggy - likely from pain meds today)    Frequency    Min 3X/week      PT Plan Current plan remains appropriate    Co-evaluation  AM-PAC PT "6 Clicks" Daily Activity  Outcome Measure  Difficulty turning over in bed (including adjusting bedclothes, sheets and blankets)?: Unable Difficulty moving from lying on back to sitting on the side of the bed? : Unable Difficulty sitting down on and standing up from a chair with arms (e.g., wheelchair, bedside commode, etc,.)?: Unable Help needed moving to and from a bed to chair (including a wheelchair)?: Total Help needed walking in hospital room?: Total Help needed climbing 3-5 steps with a railing? : Total 6 Click Score: 6    End of Session   Activity Tolerance: Patient limited by lethargy Patient left: with call bell/phone within reach;in bed;with bed alarm set;with family/visitor present Nurse Communication: Mobility status PT Visit Diagnosis: Muscle weakness (generalized) (M62.81);Other abnormalities  of gait and mobility (R26.89)     Time: 0254-2706 PT Time Calculation (min) (ACUTE ONLY): 33 min  Charges:  $Therapeutic Activity: 8-22 mins                    G Codes:       Carmelia Bake, PT, DPT 07/14/2017 Pager: [phone]  York Ram E 07/14/2017, 2:43 PM

## 2017-07-15 LAB — CULTURE, BLOOD (ROUTINE X 2)
Culture: NO GROWTH
Culture: NO GROWTH
SPECIAL REQUESTS: ADEQUATE
Special Requests: ADEQUATE

## 2017-07-15 LAB — CREATININE, SERUM
Creatinine, Ser: 0.93 mg/dL (ref 0.44–1.00)
GFR, EST NON AFRICAN AMERICAN: 54 mL/min — AB (ref >60)

## 2017-07-15 LAB — GLUCOSE, CAPILLARY
GLUCOSE-CAPILLARY: 166 mg/dL — AB (ref 65–99)
GLUCOSE-CAPILLARY: 193 mg/dL — AB (ref 65–99)
Glucose-Capillary: 234 mg/dL — ABNORMAL HIGH (ref 65–99)
Glucose-Capillary: 203 mg/dL — ABNORMAL HIGH (ref 65–99)

## 2017-07-15 MED ORDER — METOPROLOL TARTRATE 25 MG PO TABS: 25.0000 mg | ORAL_TABLET | Freq: Two times a day (BID) | ORAL | Status: DC

## 2017-07-15 MED ORDER — HYDROCODONE-ACETAMINOPHEN 5-325 MG PO TABS: 1.0000 | ORAL_TABLET | Freq: Four times a day (QID) | ORAL | Status: DC | PRN

## 2017-07-15 MED ORDER — METOPROLOL TARTRATE 25 MG PO TABS
25.0000 mg | ORAL_TABLET | Freq: Two times a day (BID) | ORAL | Status: DC
  Administered 2017-07-15 – 2017-07-17 (×5): 25 mg via ORAL
  Filled 2017-07-15 (×5): qty 1

## 2017-07-15 MED ORDER — HYDROCODONE-ACETAMINOPHEN 5-325 MG PO TABS: 1.0000 | ORAL_TABLET | Freq: Four times a day (QID) | ORAL | Status: DC

## 2017-07-15 MED ORDER — TRAMADOL HCL 50 MG PO TABS
50.0000 mg | ORAL_TABLET | Freq: Four times a day (QID) | ORAL | Status: DC | PRN
  Administered 2017-07-15 – 2017-07-17 (×3): 50 mg via ORAL
  Filled 2017-07-15 (×3): qty 1

## 2017-07-15 NOTE — Progress Notes (Signed)
PROGRESS NOTE    Anna [name]  YHC:[id number] DOB: 07-Nov-1928 DOA: 07/09/2017 PCP: Dorothyann Peng, NP    Brief Narrative: Anna Somero Meadowsis a 83 y.o.femalewith medical history significant ofAlzheimer's dementia. Patient has had 2 shots in sacrum last month for coccygodynia, last on 10/29, it was followed by worsening pain and a small draining wound area.  Also developed fever and was brought to ED for further evaluation.  She was admitted to medical service for evaluation of the abscesses of the coccygeal area.  The wound cultures grew Enterococcus faecalis sensitive to ampicillin.  Cultures from bone biopsy from the coccygeal area grew very dense streptococcus sensitive to penicillins.  Infectious disease consulted recommended 2 weeks of IV Unasyn followed by 2 months of oral Augmentin to complete the course.  Discussed the above with the patient's daughter at bedside.  She will probably need a PICC line for the 2 weeks of IV Unasyn.  Physical therapy recommended home health PT with 24-hour supervision.    Assessment & Plan:   Principal Problem:   Abscess of coccyx Active Problems:   Benign essential HTN   DM (diabetes mellitus) type II controlled with renal manifestation (HCC)   Acute metabolic encephalopathy   Alzheimer's dementia with behavioral disturbance   Pilonidal abscess   Abscess of the coccyx:  CT of the pelvis on admission shows small abscesses possible fistula of the left of coccyx, with focal loss of the sacral fat plane possible represent fistula to posterior rectum.  She was started on broad-spectrum antibiotics.  Blood cultures sent, have been negative so far.  Surgery consulted and she underwent I&D and wound cultures sent for analysis and they are growing enterococcus faecalis sensitive to ampicillin.  As per surgery there was no evidence of any fistula. Continue with the wound care daily dressing changes as per surgery recommendations, pain control, physical  therapy, ambulate the patient out of bed.  Bone biopsy growing viridans streptococcus sensitive to penicillins.  Discussed with Dr hatcher recommended 2 weeks of IV Unasyn followed by 2 more months of Augmentin to complete the course.  Discussed the above with the patient's daughter at bedside. She will probably need a PICC line for the 2 weeks of IV Unasyn.   Klebsiella urinary tract infection On broad-spectrum antibiotics to complete the course.  Currently asymptomatic.    Chronic atrial fibrillation Currently on aspirin, follow-up as an outpatient with cardiology.  Rate controlled with metoprolol.  Increase the metoprolol from 12.5 mg twice daily to 25 mg twice daily.    Hypertension well controlled continue metoprolol. No change in meds    mild acute on Stage III CKD; Possibly from vancomycin . D/c IV vancomycin.  Baseline creatinine appears to be around 1.1.  Avoid nephrotoxins. Creatinine back to baseline.   Mild thrombocytopenia possibly secondary to infection continue to monitor.  No signs of bleeding.  Platelet count improving. Recheck cbc in am shows normal platelets.    Dementia/metabolic encephalopathy Possibly secondary to infection.  Resume home medications of donepezil.   Type 2 diabetes mellitus Holding home medications, continue with sensitive sliding scale for now. CBG (last 3)  Recent Labs    07/14/17 1722 07/14/17 2145 07/15/17 0746  GLUCAP 183* 203* 166*  no change in meds.     History of chronic systolic heart failure Patient's last echo was done in January 2018, left ventricular ejection fraction 35% to 40%.  Continue Lasix.   Cough nonproductive,  Repeat chest x-ray shows mild interstitial edema,  1 dose of IV Lasix 20 mg given. One exam no more rales or rhonchi.  Resume home meds of lasix 30 mg daily.  Patient's mom today request for repeat chest x-ray this patient is was coughing more last night.   DVT prophylaxis: Lovenox Code Status:  Full code Family Communication: Daughter at bedside Disposition Plan: Pending further evaluation by PT   Consultants:   Surgery   Procedures: Status post irrigation and debridement of the abscess on November 20 by Dr. Rosendo Gros  Antimicrobials: Vancomycin, cefepime , Flagyl  Subjective: Pt comfortable, off oxygen.   Objective: Vitals:   07/14/17 1630 07/14/17 1737 07/14/17 2020 07/15/17 0517  BP: (!) 145/72 (!) 152/70 139/60 (!) 147/82  Pulse: (!) 135 87 (!) 104 (!) 101  Resp: 18  18   Temp: 98.2 F (36.8 C)  98.4 F (36.9 C) 98.3 F (36.8 C)  TempSrc: Axillary  Axillary Oral  SpO2: 93%  96% 93%  Weight:      Height:        Intake/Output Summary (Last 24 hours) at 07/15/2017 1038 Last data filed at 07/15/2017 0600 Gross per 24 hour  Intake 440 ml  Output 1950 ml  Net -1510 ml   Filed Weights   07/10/17 0149 07/10/17 0511  Weight: 80.7 kg (178 lb) 77.2 kg (170 lb 3.1 oz)    Examination:  General exam: Appears calm and comfortable , not in any distress. Off oxygen.  Respiratory system: air entry fair. No wheezing or rhonchi. No tachypnea. Cardiovascular system: S1 & S2 heard, RRR. No JVD No pedal edema. Gastrointestinal system: Abdomen is soft non tender non distended bowel sounds heard.  Central nervous system: Alert, able to more her extremities,  But she is not communicating.  Extremities: Symmetric 5 x 5 power. No  Pedal edema. Skin: sacral abscess, bandaged. No drainage.  Psychiatry:. Mood & affect appropriate.     Data Reviewed: I have personally reviewed following labs and imaging studies  CBC: Recent Labs  Lab 07/09/17 2241 07/10/17 1127 07/11/17 0534 07/12/17 0511 07/14/17 0501  WBC 12.1* 11.7* 10.1 7.3 6.4  NEUTROABS 9.1*  --   --   --   --   HGB 14.8 13.5 13.2 13.1 12.8  HCT 44.3 41.3 40.2 40.1 40.0  MCV 94.9 95.4 95.3 97.1 96.2  PLT 133* 143* 102* 123* 741   Basic Metabolic Panel: Recent Labs  Lab 07/09/17 2241 07/10/17 1127  07/11/17 0534 07/12/17 0511 07/13/17 0529 07/14/17 0501 07/15/17 0531  NA 138 141 141 141  --   --   --   K 3.6 3.0* 3.6 4.2  --   --   --   CL 105 108 108 108  --   --   --   CO2 23 27 26 26   --   --   --   GLUCOSE 162* 117* 138* 95  --   --   --   BUN 18 14 16 14   --   --   --   CREATININE 1.07* 1.05* 1.15* 1.05* 1.06* 1.20* 0.93  CALCIUM 9.2 8.9 9.0 9.0  --   --   --    GFR: Estimated Creatinine Clearance: 44.6 mL/min (by C-G formula based on SCr of 0.93 mg/dL). Liver Function Tests: Recent Labs  Lab 07/09/17 2241 07/10/17 1127  AST 16 21  ALT 13* 13*  ALKPHOS 69 72  BILITOT 1.0 1.8*  PROT 6.4* 5.9*  ALBUMIN 3.6 3.2*   No results for input(s):  LIPASE, AMYLASE in the last 168 hours. No results for input(s): AMMONIA in the last 168 hours. Coagulation Profile: Recent Labs  Lab 07/10/17 1127  INR 1.13   Cardiac Enzymes: No results for input(s): CKTOTAL, CKMB, CKMBINDEX, TROPONINI in the last 168 hours. BNP (last 3 results) No results for input(s): PROBNP in the last 8760 hours. HbA1C: No results for input(s): HGBA1C in the last 72 hours. CBG: Recent Labs  Lab 07/14/17 0756 07/14/17 1153 07/14/17 1722 07/14/17 2145 07/15/17 0746  GLUCAP 170* 216* 183* 203* 166*   Lipid Profile: No results for input(s): CHOL, HDL, LDLCALC, TRIG, CHOLHDL, LDLDIRECT in the last 72 hours. Thyroid Function Tests: No results for input(s): TSH, T4TOTAL, FREET4, T3FREE, THYROIDAB in the last 72 hours. Anemia Panel: No results for input(s): VITAMINB12, FOLATE, FERRITIN, TIBC, IRON, RETICCTPCT in the last 72 hours. Sepsis Labs: Recent Labs  Lab 07/09/17 2352  LATICACIDVEN 1.50    Recent Results (from the past 240 hour(s))  Culture, Urine     Status: Abnormal   Collection Time: 07/09/17 11:08 PM  Result Value Ref Range Status   Specimen Description URINE, CLEAN CATCH  Final   Special Requests NONE  Final   Culture >=100,000 COLONIES/mL KLEBSIELLA PNEUMONIAE (A)  Final    Report Status 07/12/2017 FINAL  Final   Organism ID, Bacteria KLEBSIELLA PNEUMONIAE (A)  Final      Susceptibility   Klebsiella pneumoniae - MIC*    AMPICILLIN >=32 RESISTANT Resistant     CEFAZOLIN <=4 SENSITIVE Sensitive     CEFTRIAXONE <=1 SENSITIVE Sensitive     CIPROFLOXACIN <=0.25 SENSITIVE Sensitive     GENTAMICIN <=1 SENSITIVE Sensitive     IMIPENEM <=0.25 SENSITIVE Sensitive     NITROFURANTOIN 32 SENSITIVE Sensitive     TRIMETH/SULFA <=20 SENSITIVE Sensitive     AMPICILLIN/SULBACTAM 4 SENSITIVE Sensitive     PIP/TAZO <=4 SENSITIVE Sensitive     Extended ESBL NEGATIVE Sensitive     * >=100,000 COLONIES/mL KLEBSIELLA PNEUMONIAE  Blood Culture (routine x 2)     Status: None (Preliminary result)   Collection Time: 07/09/17 11:28 PM  Result Value Ref Range Status   Specimen Description BLOOD RIGHT HAND  Final   Special Requests   Final    BOTTLES DRAWN AEROBIC AND ANAEROBIC Blood Culture adequate volume   Culture   Final    NO GROWTH 4 DAYS Performed at [facility], [address], [place], [place] [address]    Report Status PENDING  Incomplete  Wound or Superficial Culture     Status: None (Preliminary result)   Collection Time: 07/09/17 11:29 PM  Result Value Ref Range Status   Specimen Description SACRAL  Final   Special Requests NONE  Final   Gram Stain   Final    RARE WBC PRESENT, PREDOMINANTLY MONONUCLEAR ABUNDANT GRAM POSITIVE COCCI IN PAIRS MODERATE GRAM NEGATIVE RODS    Culture   Final    ABUNDANT ENTEROCOCCUS FAECALIS CULTURE REINCUBATED FOR BETTER GROWTH Performed at [facility], [place] [address] [place], [place] [address]    Report Status PENDING  Incomplete   Organism ID, Bacteria ENTEROCOCCUS FAECALIS  Final      Susceptibility   Enterococcus faecalis - MIC*    AMPICILLIN <=2 SENSITIVE Sensitive     VANCOMYCIN 1 SENSITIVE Sensitive     GENTAMICIN SYNERGY SENSITIVE Sensitive     * ABUNDANT ENTEROCOCCUS FAECALIS  Blood Culture (routine x  2)     Status: None (Preliminary  result)   Collection Time: 07/09/17 11:33 PM  Result Value Ref Range Status   Specimen Description BLOOD LEFT HAND  Final   Special Requests   Final    BOTTLES DRAWN AEROBIC AND ANAEROBIC Blood Culture adequate volume   Culture   Final    NO GROWTH 4 DAYS Performed at [facility], [address], [place], [place] [address]    Report Status PENDING  Incomplete  Surgical PCR screen     Status: None   Collection Time: 07/10/17  9:51 PM  Result Value Ref Range Status   MRSA, PCR NEGATIVE NEGATIVE Final   Staphylococcus aureus NEGATIVE NEGATIVE Final    Comment: (NOTE) The Xpert SA Assay (FDA approved for NASAL specimens in patients 61 years of age and older), is one component of a comprehensive surveillance program. It is not intended to diagnose infection nor to guide or monitor treatment.   Aerobic/Anaerobic Culture (surgical/deep wound)     Status: None (Preliminary result)   Collection Time: 07/11/17 12:47 PM  Result Value Ref Range Status   Specimen Description BONE  Final   Special Requests NONE  Final   Gram Stain   Final    RARE WBC PRESENT, PREDOMINANTLY PMN RARE GRAM POSITIVE COCCI    Culture   Final    FEW VIRIDANS STREPTOCOCCUS HOLDING FOR POSSIBLE ANAEROBE Performed at [facility], [place] [address] [place], [place] [address]    Report Status PENDING  Incomplete   Organism ID, Bacteria VIRIDANS STREPTOCOCCUS  Final      Susceptibility   Viridans streptococcus - MIC*    PENICILLIN <=0.06 SENSITIVE Sensitive     CEFTRIAXONE <=0.12 SENSITIVE Sensitive     ERYTHROMYCIN >=8 RESISTANT Resistant     LEVOFLOXACIN 1 SENSITIVE Sensitive     VANCOMYCIN 0.5 SENSITIVE Sensitive     * FEW VIRIDANS STREPTOCOCCUS  Aerobic/Anaerobic Culture (surgical/deep wound)     Status: None (Preliminary result)   Collection Time: 07/11/17 12:50 PM  Result Value Ref Range Status   Specimen Description WOUND SACRAL  Final   Special Requests  NONE  Final   Gram Stain   Final    RARE WBC PRESENT,BOTH PMN AND MONONUCLEAR NO ORGANISMS SEEN    Culture   Final    FEW VIRIDANS STREPTOCOCCUS SUSCEPTIBILITIES PERFORMED ON PREVIOUS CULTURE WITHIN THE LAST 5 DAYS. HOLDING FOR POSSIBLE ANAEROBE Performed at [facility], [place] [address] [place], [place] [address]    Report Status PENDING  Incomplete  Aerobic Culture (superficial specimen)     Status: None   Collection Time: 07/11/17 12:50 PM  Result Value Ref Range Status   Specimen Description WOUND SACRAL  Final   Special Requests NONE  Final   Gram Stain NO WBC SEEN NO ORGANISMS SEEN   Final   Culture   Final    FEW VIRIDANS STREPTOCOCCUS SUSCEPTIBILITIES PERFORMED ON PREVIOUS CULTURE WITHIN THE LAST 5 DAYS. Performed at [facility], [place] [address], [place], [place] [address]    Report Status 07/14/2017 FINAL  Final         Radiology Studies: No results found.      Scheduled Meds: . calcitRIOL  0.5 mcg Oral Once per day on Mon Wed Fri  . donepezil  10 mg Oral Daily  . enoxaparin (LOVENOX) injection  40 mg Subcutaneous Q24H  . fluticasone  2 spray Each Nare Daily  . furosemide  30 mg Oral Daily  . insulin aspart  0-9 Units Subcutaneous TID WC  . lidocaine (PF)  2 mL Other Once  . loratadine  5 mg Oral Daily  . memantine  5 mg Oral QHS  . metoprolol tartrate  25 mg Oral BID  . mirabegron ER  25 mg Oral Daily  . multivitamins with iron  1 tablet Oral Weekly  . pantoprazole  40 mg Oral Daily  . senna-docusate  2 tablet Oral BID  . vitamin B-12  500 mcg Oral Daily   Continuous Infusions: . ampicillin-sulbactam (UNASYN) IV 3 g (07/15/17 0602)     LOS: 5 days    Time spent: 40 minutes.     Hosie Poisson, MD [facility] Pager [phone]  If 7PM-7AM, please contact night-coverage [email or web address] Password [facility] 07/15/2017, 10:38 AM

## 2017-07-16 ENCOUNTER — Inpatient Hospital Stay (HOSPITAL_COMMUNITY): Payer: Medicare HMO

## 2017-07-16 LAB — AEROBIC CULTURE W GRAM STAIN (SUPERFICIAL SPECIMEN)

## 2017-07-16 LAB — GLUCOSE, CAPILLARY
Glucose-Capillary: 136 mg/dL — ABNORMAL HIGH (ref 65–99)
Glucose-Capillary: 237 mg/dL — ABNORMAL HIGH (ref 65–99)
Glucose-Capillary: 195 mg/dL — ABNORMAL HIGH (ref 65–99)
Glucose-Capillary: 190 mg/dL — ABNORMAL HIGH (ref 65–99)

## 2017-07-16 LAB — AEROBIC CULTURE  (SUPERFICIAL SPECIMEN)

## 2017-07-16 LAB — AEROBIC/ANAEROBIC CULTURE (SURGICAL/DEEP WOUND)

## 2017-07-16 LAB — AEROBIC/ANAEROBIC CULTURE W GRAM STAIN (SURGICAL/DEEP WOUND)

## 2017-07-16 MED ORDER — HYDROCODONE-ACETAMINOPHEN 5-325 MG PO TABS: 1.0000 | ORAL_TABLET | Freq: Four times a day (QID) | ORAL | Status: DC | PRN

## 2017-07-16 MED ORDER — TRAMADOL HCL 50 MG PO TABS: 50.0000 mg | ORAL_TABLET | Freq: Two times a day (BID) | ORAL | 0 refills | Status: DC | PRN

## 2017-07-16 MED ORDER — SODIUM CHLORIDE 0.9% FLUSH: 10.0000 mL | INTRAVENOUS | Status: DC | PRN

## 2017-07-16 MED ORDER — AMPICILLIN-SULBACTAM IV (FOR PTA / DISCHARGE USE ONLY): 3.0000 g | Freq: Four times a day (QID) | INTRAVENOUS | 0 refills | Status: DC

## 2017-07-16 MED ORDER — METOPROLOL TARTRATE 25 MG PO TABS: 25.0000 mg | ORAL_TABLET | Freq: Two times a day (BID) | ORAL | 0 refills | Status: DC

## 2017-07-16 NOTE — Care Management Important Message (Signed)
Important Message  Patient Details  Name: Anna [name] MRN: [id number] Date of Birth: [birth year]   Medicare Important Message Given:  Yes    Erenest Rasher, RN 07/16/2017, 11:55 AM

## 2017-07-16 NOTE — Progress Notes (Signed)
PROGRESS NOTE    Anna [name]  XKG:[id number] DOB: Jun 05, 1929 DOA: 07/09/2017 PCP: Anna Peng, NP    Brief Narrative: Achaia Garlock Meadowsis a 83 y.o.femalewith medical history significant ofAlzheimer's dementia. Patient has had 2 shots in sacrum last month for coccygodynia, last on 10/29, it was followed by worsening pain and a small draining wound area.  Also developed fever and was brought to ED for further evaluation.  She was admitted to medical service for evaluation of the abscesses of the coccygeal area.  The wound cultures grew Enterococcus faecalis sensitive to ampicillin.  Cultures from bone biopsy from the coccygeal area grew very dense streptococcus sensitive to penicillins.  Infectious disease consulted recommended 2 weeks of IV Unasyn followed by 2 months of oral Augmentin to complete the course.  Discussed the above with the patient's daughter at bedside.  She will probably need a PICC line for the 2 weeks of IV Unasyn.  Physical therapy recommended home health PT with 24-hour supervision.    Assessment & Plan:   Principal Problem:   Abscess of coccyx Active Problems:   Benign essential HTN   DM (diabetes mellitus) type II controlled with renal manifestation (HCC)   Acute metabolic encephalopathy   Alzheimer's dementia with behavioral disturbance   Pilonidal abscess   Abscess of the coccyx:  CT of the pelvis on admission shows small abscesses possible fistula of the left of coccyx, with focal loss of the sacral fat plane possible represent fistula to posterior rectum.  She was started on broad-spectrum antibiotics.  Blood cultures sent, have been negative so far.  Surgery consulted and she underwent I&D and wound cultures sent for analysis and they are growing enterococcus faecalis sensitive to ampicillin.  As per surgery there was no evidence of any fistula. Continue with the wound care daily dressing changes as per surgery recommendations, pain control, physical  therapy, ambulate the patient out of bed.  Bone biopsy growing viridans streptococcus sensitive to penicillins.  Discussed with Dr hatcher recommended 2 weeks of IV Unasyn followed by 2 more months of Augmentin to complete the course.  Discussed the above with the patient's daughter at bedside. She will probably need a PICC line for the 2 weeks of IV Unasyn. Home health orders for home health PT OT, RN, home health aide, social worker.  Klebsiella urinary tract infection On broad-spectrum antibiotics to complete the course.  Currently asymptomatic.    Chronic atrial fibrillation Currently on aspirin, follow-up as an outpatient with cardiology.  Rate controlled with metoprolol.  Increase the metoprolol from 12.5 mg twice daily to 25 mg twice daily.  Her heart rate is controlled    Hypertension well controlled continue metoprolol. No change in meds    mild acute on Stage III CKD; Possibly from vancomycin . D/c IV vancomycin.  Baseline creatinine appears to be around 1.1.  Avoid nephrotoxins. Creatinine back to baseline.   Mild thrombocytopenia possibly secondary to infection continue to monitor.  No signs of bleeding.  Platelet count improving. Recheck cbc in am shows normal platelets.    Dementia/metabolic encephalopathy Possibly secondary to infection.  Resume home medications of donepezil.   Type 2 diabetes mellitus Holding home medications, continue with sensitive sliding scale for now. CBG (last 3)  Recent Labs    07/15/17 2106 07/16/17 0723 07/16/17 1154  GLUCAP 193* 136* 237*  no change in meds.     History of chronic systolic heart failure Patient's last echo was done in January 2018, left ventricular ejection  fraction 35% to 40%.  Continue Lasix.   Cough nonproductive,  Repeat chest x-ray today.  DVT prophylaxis: Lovenox Code Status: Full code Family Communication: Daughter at bedside Disposition Plan: Home in the morning.   Consultants:    Surgery   Procedures: Status post irrigation and debridement of the abscess on November 20 by Dr. Rosendo Gros  Antimicrobials: Vancomycin, cefepime , Flagyl  Subjective: Pt comfortable, off oxygen.  Patient denies any pain nausea or vomiting  Objective: Vitals:   07/15/17 0517 07/15/17 1246 07/15/17 2111 07/16/17 0559  BP: (!) 147/82 (!) 156/74 (!) 158/65 (!) 142/70  Pulse: (!) 101 62 88 82  Resp:  18 20 20   Temp: 98.3 F (36.8 C) 98.7 F (37.1 C) 98.1 F (36.7 C) 97.6 F (36.4 C)  TempSrc: Oral Axillary Axillary Oral  SpO2: 93% 98% 95% 99%  Weight:    78.8 kg (173 lb 11.6 oz)  Height:        Intake/Output Summary (Last 24 hours) at 07/16/2017 1249 Last data filed at 07/16/2017 0604 Gross per 24 hour  Intake 400 ml  Output 700 ml  Net -300 ml   Filed Weights   07/10/17 0149 07/10/17 0511 07/16/17 0559  Weight: 80.7 kg (178 lb) 77.2 kg (170 lb 3.1 oz) 78.8 kg (173 lb 11.6 oz)    Examination:  General exam: Appears calm and comfortable Respiratory system: Good air entry bilateral, no wheezing, rhonchi Cardiovascular system: S1 & S2 heard, RRR. No JVD No pedal edema. Gastrointestinal system: Abdomen is soft nontender nondistended bowel sounds are heard. Central nervous system: Alert and speaking, able to move all extremities.  Nonfocal  extremities: Symmetric 5 x 5 power. No  Pedal edema. Skin: sacral abscess, bandaged. No drainage.  Psychiatry:. Mood & affect appropriate.     Data Reviewed: I have personally reviewed following labs and imaging studies  CBC: Recent Labs  Lab 07/09/17 2241 07/10/17 1127 07/11/17 0534 07/12/17 0511 07/14/17 0501  WBC 12.1* 11.7* 10.1 7.3 6.4  NEUTROABS 9.1*  --   --   --   --   HGB 14.8 13.5 13.2 13.1 12.8  HCT 44.3 41.3 40.2 40.1 40.0  MCV 94.9 95.4 95.3 97.1 96.2  PLT 133* 143* 102* 123* 025   Basic Metabolic Panel: Recent Labs  Lab 07/09/17 2241 07/10/17 1127 07/11/17 0534 07/12/17 0511 07/13/17 0529  07/14/17 0501 07/15/17 0531  NA 138 141 141 141  --   --   --   K 3.6 3.0* 3.6 4.2  --   --   --   CL 105 108 108 108  --   --   --   CO2 23 27 26 26   --   --   --   GLUCOSE 162* 117* 138* 95  --   --   --   BUN 18 14 16 14   --   --   --   CREATININE 1.07* 1.05* 1.15* 1.05* 1.06* 1.20* 0.93  CALCIUM 9.2 8.9 9.0 9.0  --   --   --    GFR: Estimated Creatinine Clearance: 45 mL/min (by C-G formula based on SCr of 0.93 mg/dL). Liver Function Tests: Recent Labs  Lab 07/09/17 2241 07/10/17 1127  AST 16 21  ALT 13* 13*  ALKPHOS 69 72  BILITOT 1.0 1.8*  PROT 6.4* 5.9*  ALBUMIN 3.6 3.2*   No results for input(s): LIPASE, AMYLASE in the last 168 hours. No results for input(s): AMMONIA in the last 168 hours. Coagulation Profile:  Recent Labs  Lab 07/10/17 1127  INR 1.13   Cardiac Enzymes: No results for input(s): CKTOTAL, CKMB, CKMBINDEX, TROPONINI in the last 168 hours. BNP (last 3 results) No results for input(s): PROBNP in the last 8760 hours. HbA1C: No results for input(s): HGBA1C in the last 72 hours. CBG: Recent Labs  Lab 07/15/17 1150 07/15/17 1625 07/15/17 2106 07/16/17 0723 07/16/17 1154  GLUCAP 234* 203* 193* 136* 237*   Lipid Profile: No results for input(s): CHOL, HDL, LDLCALC, TRIG, CHOLHDL, LDLDIRECT in the last 72 hours. Thyroid Function Tests: No results for input(s): TSH, T4TOTAL, FREET4, T3FREE, THYROIDAB in the last 72 hours. Anemia Panel: No results for input(s): VITAMINB12, FOLATE, FERRITIN, TIBC, IRON, RETICCTPCT in the last 72 hours. Sepsis Labs: Recent Labs  Lab 07/09/17 2352  LATICACIDVEN 1.50    Recent Results (from the past 240 hour(s))  Culture, Urine     Status: Abnormal   Collection Time: 07/09/17 11:08 PM  Result Value Ref Range Status   Specimen Description URINE, CLEAN CATCH  Final   Special Requests NONE  Final   Culture >=100,000 COLONIES/mL KLEBSIELLA PNEUMONIAE (A)  Final   Report Status 07/12/2017 FINAL  Final   Organism  ID, Bacteria KLEBSIELLA PNEUMONIAE (A)  Final      Susceptibility   Klebsiella pneumoniae - MIC*    AMPICILLIN >=32 RESISTANT Resistant     CEFAZOLIN <=4 SENSITIVE Sensitive     CEFTRIAXONE <=1 SENSITIVE Sensitive     CIPROFLOXACIN <=0.25 SENSITIVE Sensitive     GENTAMICIN <=1 SENSITIVE Sensitive     IMIPENEM <=0.25 SENSITIVE Sensitive     NITROFURANTOIN 32 SENSITIVE Sensitive     TRIMETH/SULFA <=20 SENSITIVE Sensitive     AMPICILLIN/SULBACTAM 4 SENSITIVE Sensitive     PIP/TAZO <=4 SENSITIVE Sensitive     Extended ESBL NEGATIVE Sensitive     * >=100,000 COLONIES/mL KLEBSIELLA PNEUMONIAE  Blood Culture (routine x 2)     Status: None   Collection Time: 07/09/17 11:28 PM  Result Value Ref Range Status   Specimen Description BLOOD RIGHT HAND  Final   Special Requests   Final    BOTTLES DRAWN AEROBIC AND ANAEROBIC Blood Culture adequate volume   Culture   Final    NO GROWTH 5 DAYS Performed at [facility], [address] [place], [place] [address]    Report Status 07/15/2017 FINAL  Final  Wound or Superficial Culture     Status: None   Collection Time: 07/09/17 11:29 PM  Result Value Ref Range Status   Specimen Description SACRAL  Final   Special Requests NONE  Final   Gram Stain   Final    RARE WBC PRESENT, PREDOMINANTLY MONONUCLEAR ABUNDANT GRAM POSITIVE COCCI IN PAIRS MODERATE GRAM NEGATIVE RODS    Culture   Final    ABUNDANT ENTEROCOCCUS FAECALIS ABUNDANT ACTINOMYCES SPECIES Standardized susceptibility testing for this organism is not available. Performed at [facility], [place] [address], [place], [place] [address]    Report Status 07/16/2017 FINAL  Final   Organism ID, Bacteria ENTEROCOCCUS FAECALIS  Final      Susceptibility   Enterococcus faecalis - MIC*    AMPICILLIN <=2 SENSITIVE Sensitive     VANCOMYCIN 1 SENSITIVE Sensitive     GENTAMICIN SYNERGY SENSITIVE Sensitive     * ABUNDANT ENTEROCOCCUS FAECALIS  Blood Culture (routine x 2)     Status: None    Collection Time: 07/09/17 11:33 PM  Result Value Ref Range Status  Specimen Description BLOOD LEFT HAND  Final   Special Requests   Final    BOTTLES DRAWN AEROBIC AND ANAEROBIC Blood Culture adequate volume   Culture   Final    NO GROWTH 5 DAYS Performed at [facility], [address], [place], [place] [address]    Report Status 07/15/2017 FINAL  Final  Surgical PCR screen     Status: None   Collection Time: 07/10/17  9:51 PM  Result Value Ref Range Status   MRSA, PCR NEGATIVE NEGATIVE Final   Staphylococcus aureus NEGATIVE NEGATIVE Final    Comment: (NOTE) The Xpert SA Assay (FDA approved for NASAL specimens in patients 49 years of age and older), is one component of a comprehensive surveillance program. It is not intended to diagnose infection nor to guide or monitor treatment.   Aerobic/Anaerobic Culture (surgical/deep wound)     Status: None (Preliminary result)   Collection Time: 07/11/17 12:47 PM  Result Value Ref Range Status   Specimen Description BONE  Final   Special Requests NONE  Final   Gram Stain   Final    RARE WBC PRESENT, PREDOMINANTLY PMN RARE GRAM POSITIVE COCCI    Culture   Final    FEW VIRIDANS STREPTOCOCCUS RARE BACTEROIDES THETAIOTAOMICRON BETA LACTAMASE POSITIVE CULTURE REINCUBATED FOR BETTER GROWTH Performed at [facility], [place] [address] [place], [place] [address]    Report Status PENDING  Incomplete   Organism ID, Bacteria VIRIDANS STREPTOCOCCUS  Final      Susceptibility   Viridans streptococcus - MIC*    PENICILLIN <=0.06 SENSITIVE Sensitive     CEFTRIAXONE <=0.12 SENSITIVE Sensitive     ERYTHROMYCIN >=8 RESISTANT Resistant     LEVOFLOXACIN 1 SENSITIVE Sensitive     VANCOMYCIN 0.5 SENSITIVE Sensitive     * FEW VIRIDANS STREPTOCOCCUS  Aerobic/Anaerobic Culture (surgical/deep wound)     Status: None   Collection Time: 07/11/17 12:50 PM  Result Value Ref Range Status   Specimen Description WOUND SACRAL  Final   Special  Requests NONE  Final   Gram Stain   Final    RARE WBC PRESENT,BOTH PMN AND MONONUCLEAR NO ORGANISMS SEEN    Culture   Final    FEW VIRIDANS STREPTOCOCCUS SUSCEPTIBILITIES PERFORMED ON PREVIOUS CULTURE WITHIN THE LAST 5 DAYS. FEW PEPTOSTREPTOCOCCUS ASACCHAROLYTICUS RARE BACTEROIDES FRAGILIS BETA LACTAMASE POSITIVE Performed at [facility], [place] [address] [place], [place] [address]    Report Status 07/16/2017 FINAL  Final  Aerobic Culture (superficial specimen)     Status: None   Collection Time: 07/11/17 12:50 PM  Result Value Ref Range Status   Specimen Description WOUND SACRAL  Final   Special Requests NONE  Final   Gram Stain NO WBC SEEN NO ORGANISMS SEEN   Final   Culture   Final    FEW VIRIDANS STREPTOCOCCUS SUSCEPTIBILITIES PERFORMED ON PREVIOUS CULTURE WITHIN THE LAST 5 DAYS. Performed at [facility], [place] [address] [place], [place] [address]    Report Status 07/14/2017 FINAL  Final         Radiology Studies: No results found.      Scheduled Meds: . calcitRIOL  0.5 mcg Oral Once per day on Mon Wed Fri  . donepezil  10 mg Oral Daily  . enoxaparin (LOVENOX) injection  40 mg Subcutaneous Q24H  . fluticasone  2 spray Each Nare Daily  . furosemide  30 mg Oral Daily  . insulin aspart  0-9 Units Subcutaneous  TID WC  . lidocaine (PF)  2 mL Other Once  . loratadine  5 mg Oral Daily  . memantine  5 mg Oral QHS  . metoprolol tartrate  25 mg Oral BID  . mirabegron ER  25 mg Oral Daily  . multivitamins with iron  1 tablet Oral Weekly  . pantoprazole  40 mg Oral Daily  . senna-docusate  2 tablet Oral BID  . vitamin B-12  500 mcg Oral Daily   Continuous Infusions: . ampicillin-sulbactam (UNASYN) IV 3 g (07/16/17 1225)     LOS: 6 days    Time spent: 40 minutes.     Hosie Poisson, MD [facility] Pager [phone]  If 7PM-7AM, please contact night-coverage [email or web address] Password [facility] 07/16/2017, 12:49 PM

## 2017-07-16 NOTE — Progress Notes (Signed)
PHARMACY CONSULT NOTE FOR:  OUTPATIENT  PARENTERAL ANTIBIOTIC THERAPY (OPAT)  Indication: abscess of coccyx Regimen: Unasyn 3gm IV q6h x total two weeks End date: 07/28/2017  IV antibiotic discharge orders are pended. To discharging provider:  please sign these orders via discharge navigator,  Select New Orders & click on the button choice - Manage This Unsigned Work.     Thank you for allowing pharmacy to be a part of this patient's care.  Anna [name] 07/16/2017, 10:58 AM

## 2017-07-16 NOTE — Progress Notes (Signed)
Occupational Therapy Treatment Patient Details Name: Anna [name] MRN: [id number] DOB: 1929/04/29 Today's Date: 07/16/2017    History of present illness Pt admitted with coccyx abscess (per daughter, had received 2nd steroid injection for pain control 10/29), underwent sacral I&D 11/20. PMH: Alzheimer's dementia, HTN. DM, CHF, afib, thyroid mass   OT comments  Pt needed total A with bed mobility and standing  Follow Up Recommendations  Supervision/Assistance - 24 hour;Home health OT    Equipment Recommendations  3 in 1 bedside commode , hospital bed, daughter reports she is getting a purewick for home   Recommendations for Other Services      Precautions / Restrictions Precautions Precautions: Fall Precaution Comments: urinary incontinence, s/p sacral I&D       Mobility Bed Mobility Overal bed mobility: Needs Assistance Bed Mobility: Supine to Sit;Sit to Supine;Rolling Rolling: Total assist;+2 for physical assistance   Supine to sit: Total assist;+2 for physical assistance Sit to supine: +2 for physical assistance   General bed mobility comments: pt requiring extensive assist   Transfers Overall transfer level: Needs assistance Equipment used: 2 person hand held assist Transfers: Sit to/from Stand Sit to Stand: Total assist;+2 physical assistance         General transfer comment: daughter assisting with transfers and attempting to assist pt to her feet however observed LEs buckling (had L knee blocked) and requested pt return to sitting for safety    Balance Overall balance assessment: Needs assistance Sitting-balance support: Bilateral upper extremity supported;Feet supported Sitting balance-Leahy Scale: Poor     Standing balance support: Bilateral upper extremity supported Standing balance-Leahy Scale: Poor                             ADL either performed or assessed with clinical judgement   ADL Overall ADL's : Needs assistance/impaired                                        General ADL Comments: Pt currently requires total assist for ADL.     Vision       Perception     Praxis      Cognition Arousal/Alertness: Lethargic;Suspect due to medications Behavior During Therapy: Flat affect Overall Cognitive Status: History of cognitive impairments - at baseline                                 General Comments: pt with no verbalizations, typically attempts to assist with mobility with cues, follows commands to her ability        Exercises     Shoulder Instructions       General Comments      Pertinent Vitals/ Pain       Pain Assessment: Faces Pain Score: 6  Pain Location: sacral area Pain Descriptors / Indicators: Grimacing;Guarding  Home Living                                          Prior Functioning/Environment              Frequency  Min 2X/week        Progress Toward Goals  OT Goals(current goals can now be found in the  care plan section)  Progress towards OT goals: Not progressing toward goals - comment  Acute Rehab OT Goals Patient Stated Goal: daughter wants to take pt home  Plan Discharge plan remains appropriate    Co-evaluation                 AM-PAC PT "6 Clicks" Daily Activity     Outcome Measure   Help from another person eating meals?: Total Help from another person taking care of personal grooming?: Total Help from another person toileting, which includes using toliet, bedpan, or urinal?: Total Help from another person bathing (including washing, rinsing, drying)?: Total Help from another person to put on and taking off regular upper body clothing?: Total Help from another person to put on and taking off regular lower body clothing?: Total 6 Click Score: 6    End of Session Equipment Utilized During Treatment: Rolling walker  OT Visit Diagnosis: Unsteadiness on feet (R26.81);Pain;Feeding difficulties  (R63.3);Muscle weakness (generalized) (M62.81);Other symptoms and signs involving cognitive function   Activity Tolerance Patient limited by pain;Patient limited by fatigue   Patient Left in bed;with call bell/phone within reach;with bed alarm set;with family/visitor present;with nursing/sitter in room   Nurse Communication Mobility status(replaced purewick)        Time: 8546-2703 OT Time Calculation (min): 22 min  Charges: OT General Charges $OT Visit: 1 Visit OT Treatments $Self Care/Home Management : 8-22 mins     Yaeko Fazekas, Edwena Felty D 07/16/2017, 11:22 AM

## 2017-07-16 NOTE — Care Management Note (Addendum)
Case Management Note  Patient Details  Name: Anna [name] MRN: [id number] Date of Birth: February 04, 1929  Subjective/Objective:  Abscess of coccyx, CHF, UTI                  Action/Plan: Discharge Planning: Please see previous NCM notes. NCM contacted [facility] rep to arrange delivery of hospital bed and 3n1 bedside commode to home. Heywood Iles # [phone] is contact person. Waiting information on IV abx Rx to arrange [place] IV abx. Pending PICC placement. Made [facility] rep aware possible dc 07/16/2017 or 07/17/2017. Will [facility] for transportation to home. Dtr states she will purchase Purewick out of pocket and send claim with order to [facility] for reimbursement. Provided dtr with order to submit to order [facility], explained that [facility] require a letter of medical necessity that pt's PCP or Urologist will have to complete.    PCP Dorothyann Peng MD    Expected Discharge Date:  07/17/2017               Expected Discharge Plan:  [place]  In-House Referral:  NA  Discharge planning Services  CM Consult  Post Acute Care Choice:  Home Health Choice offered to:  Adult Children  DME Arranged:  3-N-1, Hospital bed DME Agency:  [place]:  RN, PT, OT, Social Work, IV Antibiotics HH Agency:  [place]  Status of Service:  Completed, signed off  If discussed at [employer], dates discussed:    Additional Comments:  Erenest Rasher, RN 07/16/2017, 11:21 AM

## 2017-07-16 NOTE — Progress Notes (Addendum)
Progress Note for the Evaluation of Need for a Hospital Bed  Patient Name: __[name] DOB:______[birth year]_______________ Diagnosis Codes:___A18.01, L05.01, G30.9, F02.81, I50.41___              Height:__5'5"________        Weight: __173 lbs________  Other Health Conditions  Patient has __osteomyelitis, _Abscess of coccyx, sacral wound, CHF_which requires __sacral area,  back_ to be positioned in ways not feasible with a normal bed. Head must be elevated at least ____30 degrees_______ or___sacral and back _(to relieve pressure) requires frequent and immediate changes in  body position which cannot be achieved with a normal bed.  Clinician Signature/Credentials: Jonnie Finner RN CCM Case Manager  Jonnie Finner RN CCM Case Mgmt phone [phone] Date: 07/16/2017

## 2017-07-17 DIAGNOSIS — L0501 Pilonidal cyst with abscess: Secondary | ICD-10-CM | POA: Diagnosis not present

## 2017-07-17 DIAGNOSIS — M4628 Osteomyelitis of vertebra, sacral and sacrococcygeal region: Secondary | ICD-10-CM | POA: Diagnosis not present

## 2017-07-17 DIAGNOSIS — F0280 Dementia in other diseases classified elsewhere without behavioral disturbance: Secondary | ICD-10-CM | POA: Diagnosis not present

## 2017-07-17 DIAGNOSIS — I130 Hypertensive heart and chronic kidney disease with heart failure and stage 1 through stage 4 chronic kidney disease, or unspecified chronic kidney disease: Secondary | ICD-10-CM | POA: Diagnosis not present

## 2017-07-17 DIAGNOSIS — E1169 Type 2 diabetes mellitus with other specified complication: Secondary | ICD-10-CM | POA: Diagnosis not present

## 2017-07-17 DIAGNOSIS — I509 Heart failure, unspecified: Secondary | ICD-10-CM | POA: Diagnosis not present

## 2017-07-17 DIAGNOSIS — N183 Chronic kidney disease, stage 3 (moderate): Secondary | ICD-10-CM | POA: Diagnosis not present

## 2017-07-17 DIAGNOSIS — M869 Osteomyelitis, unspecified: Secondary | ICD-10-CM

## 2017-07-17 DIAGNOSIS — E1122 Type 2 diabetes mellitus with diabetic chronic kidney disease: Secondary | ICD-10-CM | POA: Diagnosis not present

## 2017-07-17 DIAGNOSIS — G309 Alzheimer's disease, unspecified: Secondary | ICD-10-CM | POA: Diagnosis not present

## 2017-07-17 LAB — GLUCOSE, CAPILLARY
Glucose-Capillary: 139 mg/dL — ABNORMAL HIGH (ref 65–99)
Glucose-Capillary: 189 mg/dL — ABNORMAL HIGH (ref 65–99)

## 2017-07-17 LAB — AEROBIC/ANAEROBIC CULTURE W GRAM STAIN (SURGICAL/DEEP WOUND)

## 2017-07-17 LAB — AEROBIC/ANAEROBIC CULTURE (SURGICAL/DEEP WOUND)

## 2017-07-17 MED ORDER — AMOXICILLIN-POT CLAVULANATE 875-125 MG PO TABS: 1.0000 | ORAL_TABLET | Freq: Two times a day (BID) | ORAL | 1 refills | Status: AC

## 2017-07-17 MED ORDER — AMOXICILLIN-POT CLAVULANATE 875-125 MG PO TABS: 1.0000 | ORAL_TABLET | Freq: Two times a day (BID) | ORAL | Status: DC

## 2017-07-17 NOTE — Progress Notes (Signed)
Pt to be discharged to Daughter's home this afternoon. Daughter given discharge teaching including medications. Daughter verbalized understanding of all discharge instructions. Discharge Packet with Prescriptions with Daughter at time of discharge. Pt discharged to Daughter's home via ambulance. VSS and no acute changes noted in Pt's assessment at time of discharge.

## 2017-07-17 NOTE — Progress Notes (Signed)
OT Cancellation Note  Patient Details Name: Anna [name] MRN: [id number] DOB: 03-14-1929   Cancelled Treatment:    Reason Eval/Treat Not Completed: Fatigue/lethargy limiting ability to participate  Daughter not in room [name], [name]  [name] 07/17/2017, 12:36 PM

## 2017-07-17 NOTE — Progress Notes (Signed)
83 yo female with right sacral wound open with packing. No erythema around wound -ok to discharge -continue packing wound 2x daily -continue ambulating and frequent repositioning in bed

## 2017-07-17 NOTE — Progress Notes (Signed)
[facility]-iv abx, & HHRN for instruction of iv abx all set up-staff nurse will give next dose, [facility] dme-hospital bed will be delivered to home by 1p. See prior CM note about the purewick(this is not an item [facility] can provide-dtr will purchase on own, & submit the bill). Ambulance transp @ d/c-Nurse will manage once bed is in the home then she can call [facility](forms on shadow chart). No further CM needs.

## 2017-07-17 NOTE — Progress Notes (Signed)
PT Cancellation Note  Patient Details Name: Anna [name] MRN: [id number] DOB: [birth year]   Cancelled Treatment:     Attempted to see patient 2 times, Home Health RN doing family education to daughter, RN informed us patient was being discharged and PT not necessary.    Almond Lint, SPTA [facility] [phone]  Rica Koyanagi  PTA [facility]  Acute  Rehab Pager      [phone]

## 2017-07-18 ENCOUNTER — Telehealth: Payer: Self-pay | Admitting: *Deleted

## 2017-07-18 DIAGNOSIS — M4628 Osteomyelitis of vertebra, sacral and sacrococcygeal region: Secondary | ICD-10-CM | POA: Diagnosis not present

## 2017-07-18 DIAGNOSIS — F0280 Dementia in other diseases classified elsewhere without behavioral disturbance: Secondary | ICD-10-CM | POA: Diagnosis not present

## 2017-07-18 DIAGNOSIS — N183 Chronic kidney disease, stage 3 (moderate): Secondary | ICD-10-CM | POA: Diagnosis not present

## 2017-07-18 DIAGNOSIS — L0501 Pilonidal cyst with abscess: Secondary | ICD-10-CM | POA: Diagnosis not present

## 2017-07-18 DIAGNOSIS — G309 Alzheimer's disease, unspecified: Secondary | ICD-10-CM | POA: Diagnosis not present

## 2017-07-18 DIAGNOSIS — I130 Hypertensive heart and chronic kidney disease with heart failure and stage 1 through stage 4 chronic kidney disease, or unspecified chronic kidney disease: Secondary | ICD-10-CM | POA: Diagnosis not present

## 2017-07-18 DIAGNOSIS — I509 Heart failure, unspecified: Secondary | ICD-10-CM | POA: Diagnosis not present

## 2017-07-18 DIAGNOSIS — E1122 Type 2 diabetes mellitus with diabetic chronic kidney disease: Secondary | ICD-10-CM | POA: Diagnosis not present

## 2017-07-18 DIAGNOSIS — E1169 Type 2 diabetes mellitus with other specified complication: Secondary | ICD-10-CM | POA: Diagnosis not present

## 2017-07-18 NOTE — Telephone Encounter (Signed)
Transition Care Management Follow-up Telephone Call  Call completed w/ patient's daughter, Anna [name] (on [place]).   Date discharged? 07/17/17   How have you been since you were released from the hospital? "We just got home yesterday afternoon and so far so good."   Do you understand why you were in the hospital? yes   Do you understand the discharge instructions? yes   Where were you discharged to? Home   Items Reviewed:  Medications reviewed: yes  Allergies reviewed: yes  Dietary changes reviewed: yes  Referrals reviewed: yes, home health   Functional Questionnaire:   Activities of Daily Living (ADLs):   She states they are independent in the following: none States they require assistance with the following: ambulation, bathing and hygiene, feeding, continence, grooming, toileting and dressing. Patient needs total assistance.   Any transportation issues/concerns?: yes, patient is homebound currently   Any patient concerns? yes, daughter is going to buy a PureWick external female catheter for patient and submit to insurance for reimbursement. They received an order for this from hospitalist but may need sign off from PCP depending on insurance.   Confirmed importance and date/time of follow-up visits scheduled yes  Provider Appointment booked with Dorothyann Peng, NP 08/02/17 @ 11:30am. Unable to schedule sooner appointment due to patient's limited mobility and currently being homebound.  Confirmed with patient if condition begins to worsen call PCP or go to the ER.  Patient was given the office number and encouraged to call back with question or concerns.  : yes

## 2017-07-18 NOTE — Discharge Summary (Signed)
Physician Discharge Summary  Anna [name] IZT:[id number] DOB: 11-May-1929 DOA: 07/09/2017  PCP: Dorothyann Peng, NP  Admit date: 07/09/2017 Discharge date: 07/17/2017  Admitted From: Home.  Disposition:  Home.   Recommendations for Outpatient Follow-up:  1. Follow up with PCP in 1-2 weeks 2. Please obtain BMP/CBC in one week 3. Please follow up with surgery as recommended.  4. Please follow up with wound care as recommended.  5. Please follow up with ID in the clinic as recommended/   Home Health:yes  Discharge Condition:stable.  CODE STATUS: full code.  Diet recommendation: Heart Healthy  Brief/Interim Summary:  Anna [name] a 83 y.o.femalewith medical history significant ofAlzheimer's dementia. Patient has had 2 shots in sacrum last month for coccygodynia, last on 10/29, it was followed by worsening pain and a small draining wound area.  Also developed fever and was brought to ED for further evaluation.  She was admitted to medical service for evaluation of the abscesses of the coccygeal area.  The wound cultures grew Enterococcus faecalis sensitive to ampicillin.  Cultures from bone biopsy from the coccygeal area grew very dense streptococcus sensitive to penicillins.  Infectious disease consulted recommended 2 weeks of IV Unasyn followed by 2 months of oral Augmentin to complete the course.  Discussed the above with the patient's daughter at bedside.  She will probably need a PICC line for the 2 weeks of IV Unasyn.  Physical therapy recommended home health PT with 24-hour supervision.   Discharge Diagnoses:  Principal Problem:   Abscess of coccyx Active Problems:   Benign essential HTN   DM (diabetes mellitus) type II controlled with renal manifestation (HCC)   Acute metabolic encephalopathy   Alzheimer's dementia with behavioral disturbance   Pilonidal abscess  Abscess of the coccyx:  CT of the pelvis on admission shows small abscesses possible fistula of the left  of coccyx, with focal loss of the sacral fat plane possible represent fistula to posterior rectum.  She was started on broad-spectrum antibiotics.  Blood cultures sent, have been negative so far.  Surgery consulted and she underwent I&D and wound cultures sent for analysis and they are growing enterococcus faecalis sensitive to ampicillin.  As per surgery there was no evidence of any fistula. Continue with the wound care daily dressing changes as per surgery recommendations, pain control, physical therapy, ambulate the patient out of bed.  Bone biopsy growing viridans streptococcus sensitive to penicillins.  Discussed with Dr hatcher recommended 2 weeks of IV Unasyn followed by 2 more months of Augmentin to complete the course.  Discussed the above with the patient's daughter at bedside. She will probably need a PICC line for the 2 weeks of IV Unasyn. Home health orders for home health PT OT, RN, home health aide, social worker.  Klebsiella urinary tract infection On broad-spectrum antibiotics to complete the course.  Currently asymptomatic.    Chronic atrial fibrillation Currently on aspirin, follow-up as an outpatient with cardiology.  Rate controlled with metoprolol.  Increase the metoprolol from 12.5 mg twice daily to 25 mg twice daily.  Her heart rate is controlled    Hypertension well controlled continue metoprolol. No change in meds    mild acute on Stage III CKD; Possibly from vancomycin . D/c IV vancomycin.  Baseline creatinine appears to be around 1.1.  Avoid nephrotoxins. Creatinine back to baseline.   Mild thrombocytopenia possibly secondary to infection continue to monitor.  No signs of bleeding.  Platelet count improving. Recheck cbc in am shows normal  platelets.    Dementia/metabolic encephalopathy Possibly secondary to infection.  Resume home medications of donepezil.   Type 2 diabetes mellitus Holding home medications, continue with sensitive sliding  scale for now. CBG (last 3)  RecentLabs(last2labs)       Recent Labs    07/15/17 2106 07/16/17 0723 07/16/17 1154  GLUCAP 193* 136* 237*    no change in meds.     History of chronic systolic heart failure Patient's last echo was done in January 2018, left ventricular ejection fraction 35% to 40%.  Continue Lasix.   Cough nonproductive,  Repeat chest x-ray does not show any pulmonary edema.       Discharge Instructions  Discharge Instructions    Diet - low sodium heart healthy   Complete by:  As directed    Discharge instructions   Complete by:  As directed    Please follow up with surgery as recommended.  Please follow up with PCP in 1 to 2 weeks.   Home infusion instructions [facility] May follow [place] Dosing Protocol; May administer Cathflo as needed to maintain patency of vascular access device.; Flushing of vascular access device: per [facility] Protocol: 0.9% NaCl pre/post medica...   Complete by:  As directed    Instructions:  May follow [place] Dosing Protocol   Instructions:  May administer Cathflo as needed to maintain patency of vascular access device.   Instructions:  Flushing of vascular access device: per [facility] Protocol: 0.9% NaCl pre/post medication administration and prn patency; Heparin 100 u/ml, 4m for implanted ports and Heparin 10u/ml, 520mfor all other central venous catheters.   Instructions:  May follow [facility] Anaphylaxis Protocol for First Dose Administration in the home: 0.9% NaCl at 25-50 ml/hr to maintain IV access for protocol meds. Epinephrine 0.3 ml IV/IM PRN and Benadryl 25-50 IV/IM PRN s/s of anaphylaxis.   Instructions:  [facility] Coordinator (RN) to assist per patient IV care needs in the home PRN.     Allergies as of 07/17/2017      Reactions   Pollen Extract    Sneezing, watery eyes      Medication List    TAKE these medications   ACCU-CHEK AVIVA PLUS test strip Generic drug:  glucose  blood PATIENT TO CHECK SUGARS ONCE DAILY.   acetaminophen 500 MG tablet Commonly known as:  TYLENOL Take 500 mg by mouth every 6 (six) hours as needed for fever.   ALAVERT 10 MG tablet Generic drug:  loratadine Take 5 mg daily by mouth.   amoxicillin-clavulanate 875-125 MG tablet Commonly known as:  AUGMENTIN Take 1 tablet by mouth every 12 (twelve) hours. Start taking on:  07/29/2017   ampicillin-sulbactam IVPB Commonly known as:  UNASYN Inject 3 g into the vein every 6 (six) hours. Indication:  Coccyx abscess Last Day of Therapy:  07/28/2017 Labs - Once weekly:  CBC/D and BMP, Labs - Every other week:  ESR and CRP   aspirin 325 MG tablet Take 325 mg by mouth daily.   calcitRIOL 0.5 MCG capsule Commonly known as:  ROCALTROL Take 1 capsule (0.5 mcg total) by mouth 3 (three) times a week. Monday, Wednesday, and Friday only   carbamide peroxide 6.5 % OTIC solution Commonly known as:  DEBROX Place 5 drops into both ears 2 (two) times daily. What changed:    when to take this  reasons to take this   docusate sodium 100 MG capsule Commonly known as:  COLACE Take 100 mg  by mouth daily as needed for mild constipation.   donepezil 10 MG tablet Commonly known as:  ARICEPT TAKE 1 TABLET ONCE DAILY.   fluticasone 50 MCG/ACT nasal spray Commonly known as:  FLONASE Place 2 sprays into both nostrils daily.   furosemide 20 MG tablet Commonly known as:  LASIX Take 1.5 tablets (30 mg total) by mouth daily. Take an extra 0.5 tablet daily as needed.   FUSION PLUS Caps Take 1 tablet by mouth daily. What changed:  when to take this   glimepiride 2 MG tablet Commonly known as:  AMARYL TAKE 1 TABLET ONCE DAILY.   memantine 5 MG tablet Commonly known as:  NAMENDA Take 1 tablet (5 mg total) by mouth at bedtime.   metoprolol tartrate 25 MG tablet Commonly known as:  LOPRESSOR Take 1 tablet (25 mg total) by mouth 2 (two) times daily. What changed:    how much to  take  additional instructions   MYRBETRIQ 25 MG Tb24 tablet Generic drug:  mirabegron ER Take 25 mg by mouth daily.   omeprazole 40 MG capsule Commonly known as:  PRILOSEC Take 30- 60 min before your first and last meals of the day What changed:    how much to take  how to take this  when to take this  additional instructions   potassium chloride SA 20 MEQ tablet Commonly known as:  K-DUR,KLOR-CON Take 20 mEq by mouth daily.   SYSTANE 0.4-0.3 % Soln Generic drug:  Polyethyl Glycol-Propyl Glycol Apply 1 drop to eye 2 (two) times daily as needed (dry eyes).   traMADol 50 MG tablet Commonly known as:  ULTRAM Take 1 tablet (50 mg total) by mouth every 12 (twelve) hours as needed for moderate pain.   vitamin B-12 500 MCG tablet Commonly known as:  CYANOCOBALAMIN Take 500 mcg by mouth daily.            Home Infusion Instuctions  (From admission, onward)        Start     Ordered   07/16/17 0000  Home infusion instructions [facility] May follow [place] Dosing Protocol; May administer Cathflo as needed to maintain patency of vascular access device.; Flushing of vascular access device: per [facility] Protocol: 0.9% NaCl pre/post medica...    Question Answer Comment  Instructions May follow [place] Dosing Protocol   Instructions May administer Cathflo as needed to maintain patency of vascular access device.   Instructions Flushing of vascular access device: per [facility] Protocol: 0.9% NaCl pre/post medication administration and prn patency; Heparin 100 u/ml, 5m for implanted ports and Heparin 10u/ml, 551mfor all other central venous catheters.   Instructions May follow [facility] Anaphylaxis Protocol for First Dose Administration in the home: 0.9% NaCl at 25-50 ml/hr to maintain IV access for protocol meds. Epinephrine 0.3 ml IV/IM PRN and Benadryl 25-50 IV/IM PRN s/s of anaphylaxis.   Instructions [facility] Infusion Coordinator (RN) to assist per patient IV care  needs in the home PRN.      07/16/17 [place].   Why:  hospital bed,3n1 Contact information: [address] [phone]      Health, [facility]-Home Follow up.   Specialty:  Home Health Services Why:  Home Health RN, Physical Therapy, aide and Social Worker, IV antibiotics- agency will call to arrange initial appointment Contact information: [address]  [place] [address] [phone]        Dorothyann Peng, NP. Schedule an appointment as soon as possible for a visit in 1 week(s).   Specialty:  Family Medicine Contact information: [address] [place] [address] [phone]        [facility], [place] Follow up in 4 week(s).   Specialty:  General Surgery Contact information: [place] [address] [phone]         Allergies  Allergen Reactions  . Pollen Extract     Sneezing, watery eyes    Consultations:  Surgery.    Procedures/Studies: Dg Chest 2 View  Result Date: 07/12/2017 CLINICAL DATA:  Worsening cough and shortness of breath. EXAM: CHEST  2 VIEW COMPARISON:  07/09/2017 FINDINGS: AP and lateral views of the chest show low volumes. The cardio pericardial silhouette is enlarged. Vascular congestion with interstitial pulmonary edema pattern. Bones are diffusely demineralized. Telemetry leads overlie the chest. IMPRESSION: Similar appearance of cardiomegaly and apparent interstitial pulmonary edema. Electronically Signed   By: Misty Stanley M.D.   On: 07/12/2017 15:41   Ct Pelvis Wo Contrast  Result Date: 07/10/2017 CLINICAL DATA:  Fever, coccygeal pain with draining wound s/p steroid injection 3 weeks, wbc's 12.1, hx of hysterectomy, x-ray sacrum/ coccyx, no prev ct's of pelvis Difficult for patient to follow instructions due to dementia EXAM: CT PELVIS WITHOUT  CONTRAST TECHNIQUE: Multidetector CT imaging of the pelvis was performed following the standard protocol without intravenous contrast. Examination is technically limited due to motion artifact. COMPARISON:  None. FINDINGS: Urinary Tract: Bladder wall is not thickened and no bladder filling defects are identified. Distal ureters are decompressed. Bowel: The visualized portions of the colon and small bowel are not abnormally distended. Stool fills the rectosigmoid colon. Scattered diverticula without evidence of diverticulitis. Vascular/Lymphatic: Calcifications in the iliac and external iliac artery's. No aneurysm. Reproductive:  Uterus and ovaries are not enlarged. Other: Small skin defect to the left of the coccyx with underlying gas and soft tissue density measuring 3 cm in diameter. This is consistent with small abscess and fistula. There is suggestion of some bone loss in the adjacent coccyx which may indicate osteomyelitis. Focal loss of presacral fat plane could represent a fistula to the posterior rectum. Musculoskeletal: Degenerative changes in the lower lumbar spine. No expansile bone lesions are appreciated. IMPRESSION: Small abscess and fistula in the soft tissues to the left of the coccyx with possible focal osteomyelitis in the adjacent coccyx. Focal loss of presacral fat plane could represent a fistula to the posterior rectum. Electronically Signed   By: Lucienne Capers M.D.   On: 07/10/2017 02:38   Dg Chest Port 1 View  Result Date: 07/16/2017 CLINICAL DATA:  Cough today. EXAM: PORTABLE CHEST 1 VIEW COMPARISON:  PA and lateral chest 07/12/2017 and 09/14/2016. CT chest 12/30/2016. FINDINGS: There is cardiomegaly without edema. No pneumothorax or pleural effusion. Atherosclerosis noted. No acute bony abnormality. IMPRESSION: Cardiomegaly without acute disease. Atherosclerosis. Electronically Signed   By: Inge Rise M.D.   On: 07/16/2017 13:12   Dg Chest Port 1 View  Result Date:  07/10/2017 CLINICAL DATA:  Initial evaluation for acute fever. EXAM: PORTABLE CHEST 1 VIEW COMPARISON:  Prior radiograph from 03/09/2017. FINDINGS: Moderate cardiomegaly, stable. Mediastinal silhouette within normal limits. Aortic atherosclerosis. Lungs are hypoinflated. Mild diffuse vascular congestion with interstitial prominence, suggesting mild pulmonary interstitial edema. Superimposed left basilar atelectasis and/ or scarring, similar to previous. Suspected small bilateral  pleural effusions. No consolidative opacity. No pneumothorax. No acute osseus abnormality. Osteopenia. Degenerative changes noted about the right shoulder. IMPRESSION: 1. Cardiomegaly with mild diffuse pulmonary vascular congestion with interstitial prominence, suggesting mild diffuse pulmonary interstitial edema. 2. Suspected small bilateral pleural effusions. 3. Aortic atherosclerosis. Electronically Signed   By: Jeannine Boga M.D.   On: 07/10/2017 00:14   Xr C-arm No Report  Result Date: 06/19/2017 Please see Notes or Procedures tab for imaging impression.      Subjective: No chest pain or sob.   Discharge Exam: Vitals:   07/17/17 0941 07/17/17 1230  BP: 120/60 125/60  Pulse: 74 77  Resp:  18  Temp:  98.9 F (37.2 C)  SpO2:  93%   Vitals:   07/16/17 2131 07/17/17 0508 07/17/17 0941 07/17/17 1230  BP: (!) 149/69 125/61 120/60 125/60  Pulse: 88 70 74 77  Resp: '16 18  18  ' Temp: 98.2 F (36.8 C) 98.9 F (37.2 C)  98.9 F (37.2 C)  TempSrc: Oral Oral  Oral  SpO2: 98% 92%  93%  Weight:      Height:        General: Pt is alert, awake, not in acute distress Cardiovascular: RRR, S1/S2 +, no rubs, no gallops Respiratory: CTA bilaterally, no wheezing, no rhonchi Abdominal: Soft, NT, ND, bowel sounds + Extremities: no edema, no cyanosis    The results of significant diagnostics from this hospitalization (including imaging, microbiology, ancillary and laboratory) are listed below for reference.      Microbiology: Recent Results (from the past 240 hour(s))  Culture, Urine     Status: Abnormal   Collection Time: 07/09/17 11:08 PM  Result Value Ref Range Status   Specimen Description URINE, CLEAN CATCH  Final   Special Requests NONE  Final   Culture >=100,000 COLONIES/mL KLEBSIELLA PNEUMONIAE (A)  Final   Report Status 07/12/2017 FINAL  Final   Organism ID, Bacteria KLEBSIELLA PNEUMONIAE (A)  Final      Susceptibility   Klebsiella pneumoniae - MIC*    AMPICILLIN >=32 RESISTANT Resistant     CEFAZOLIN <=4 SENSITIVE Sensitive     CEFTRIAXONE <=1 SENSITIVE Sensitive     CIPROFLOXACIN <=0.25 SENSITIVE Sensitive     GENTAMICIN <=1 SENSITIVE Sensitive     IMIPENEM <=0.25 SENSITIVE Sensitive     NITROFURANTOIN 32 SENSITIVE Sensitive     TRIMETH/SULFA <=20 SENSITIVE Sensitive     AMPICILLIN/SULBACTAM 4 SENSITIVE Sensitive     PIP/TAZO <=4 SENSITIVE Sensitive     Extended ESBL NEGATIVE Sensitive     * >=100,000 COLONIES/mL KLEBSIELLA PNEUMONIAE  Blood Culture (routine x 2)     Status: None   Collection Time: 07/09/17 11:28 PM  Result Value Ref Range Status   Specimen Description BLOOD RIGHT HAND  Final   Special Requests   Final    BOTTLES DRAWN AEROBIC AND ANAEROBIC Blood Culture adequate volume   Culture   Final    NO GROWTH 5 DAYS Performed at [facility], [address], [place], [place] [address]    Report Status 07/15/2017 FINAL  Final  Wound or Superficial Culture     Status: None   Collection Time: 07/09/17 11:29 PM  Result Value Ref Range Status   Specimen Description SACRAL  Final   Special Requests NONE  Final   Gram Stain   Final    RARE WBC PRESENT, PREDOMINANTLY MONONUCLEAR ABUNDANT GRAM POSITIVE COCCI IN PAIRS MODERATE GRAM NEGATIVE RODS    Culture  Final    ABUNDANT ENTEROCOCCUS FAECALIS ABUNDANT ACTINOMYCES SPECIES Standardized susceptibility testing for this organism is not available. Performed at [facility], [place] [address],  [place], [place] [address]    Report Status 07/16/2017 FINAL  Final   Organism ID, Bacteria ENTEROCOCCUS FAECALIS  Final      Susceptibility   Enterococcus faecalis - MIC*    AMPICILLIN <=2 SENSITIVE Sensitive     VANCOMYCIN 1 SENSITIVE Sensitive     GENTAMICIN SYNERGY SENSITIVE Sensitive     * ABUNDANT ENTEROCOCCUS FAECALIS  Blood Culture (routine x 2)     Status: None   Collection Time: 07/09/17 11:33 PM  Result Value Ref Range Status   Specimen Description BLOOD LEFT HAND  Final   Special Requests   Final    BOTTLES DRAWN AEROBIC AND ANAEROBIC Blood Culture adequate volume   Culture   Final    NO GROWTH 5 DAYS Performed at [facility], [place] [address] [place], [place] [address]    Report Status 07/15/2017 FINAL  Final  Surgical PCR screen     Status: None   Collection Time: 07/10/17  9:51 PM  Result Value Ref Range Status   MRSA, PCR NEGATIVE NEGATIVE Final   Staphylococcus aureus NEGATIVE NEGATIVE Final    Comment: (NOTE) The Xpert SA Assay (FDA approved for NASAL specimens in patients 96 years of age and older), is one component of a comprehensive surveillance program. It is not intended to diagnose infection nor to guide or monitor treatment.   Aerobic/Anaerobic Culture (surgical/deep wound)     Status: None   Collection Time: 07/11/17 12:47 PM  Result Value Ref Range Status   Specimen Description BONE  Final   Special Requests NONE  Final   Gram Stain   Final    RARE WBC PRESENT, PREDOMINANTLY PMN RARE GRAM POSITIVE COCCI Performed at [facility], [place] [address] [place], [place] [address]    Culture   Final    FEW VIRIDANS STREPTOCOCCUS RARE BACTEROIDES THETAIOTAOMICRON BETA LACTAMASE POSITIVE FEW PEPTOSTREPTOCOCCUS ASACCHAROLYTICUS    Report Status 07/17/2017 FINAL  Final   Organism ID, Bacteria VIRIDANS STREPTOCOCCUS  Final      Susceptibility   Viridans streptococcus - MIC*    PENICILLIN <=0.06 SENSITIVE Sensitive     CEFTRIAXONE <=0.12  SENSITIVE Sensitive     ERYTHROMYCIN >=8 RESISTANT Resistant     LEVOFLOXACIN 1 SENSITIVE Sensitive     VANCOMYCIN 0.5 SENSITIVE Sensitive     * FEW VIRIDANS STREPTOCOCCUS  Aerobic/Anaerobic Culture (surgical/deep wound)     Status: None   Collection Time: 07/11/17 12:50 PM  Result Value Ref Range Status   Specimen Description WOUND SACRAL  Final   Special Requests NONE  Final   Gram Stain   Final    RARE WBC PRESENT,BOTH PMN AND MONONUCLEAR NO ORGANISMS SEEN    Culture   Final    FEW VIRIDANS STREPTOCOCCUS SUSCEPTIBILITIES PERFORMED ON PREVIOUS CULTURE WITHIN THE LAST 5 DAYS. FEW PEPTOSTREPTOCOCCUS ASACCHAROLYTICUS RARE BACTEROIDES FRAGILIS BETA LACTAMASE POSITIVE Performed at [facility], [place] [address] [place], [place] [address]    Report Status 07/16/2017 FINAL  Final  Aerobic Culture (superficial specimen)     Status: None   Collection Time: 07/11/17 12:50 PM  Result Value Ref Range Status   Specimen Description WOUND SACRAL  Final   Special Requests NONE  Final   Gram Stain NO WBC SEEN NO ORGANISMS SEEN   Final  Culture   Final    FEW VIRIDANS STREPTOCOCCUS SUSCEPTIBILITIES PERFORMED ON PREVIOUS CULTURE WITHIN THE LAST 5 DAYS. Performed at [facility], [place] [address] [place], [place] [address]    Report Status 07/14/2017 FINAL  Final     Labs: BNP (last 3 results) Recent Labs    09/14/16 2215 12/30/16 2035 07/10/17 1127  BNP 188.2* 108.5* 497.0*   Basic Metabolic Panel: Recent Labs  Lab 07/12/17 0511 07/13/17 0529 07/14/17 0501 07/15/17 0531  NA 141  --   --   --   K 4.2  --   --   --   CL 108  --   --   --   CO2 26  --   --   --   GLUCOSE 95  --   --   --   BUN 14  --   --   --   CREATININE 1.05* 1.06* 1.20* 0.93  CALCIUM 9.0  --   --   --    Liver Function Tests: No results for input(s): AST, ALT, ALKPHOS, BILITOT, PROT, ALBUMIN in the last 168 hours. No results for input(s): LIPASE, AMYLASE in the last 168 hours. No results  for input(s): AMMONIA in the last 168 hours. CBC: Recent Labs  Lab 07/12/17 0511 07/14/17 0501  WBC 7.3 6.4  HGB 13.1 12.8  HCT 40.1 40.0  MCV 97.1 96.2  PLT 123* 164   Cardiac Enzymes: No results for input(s): CKTOTAL, CKMB, CKMBINDEX, TROPONINI in the last 168 hours. BNP: Invalid input(s): POCBNP CBG: Recent Labs  Lab 07/16/17 1154 07/16/17 1705 07/16/17 2126 07/17/17 0749 07/17/17 1120  GLUCAP 237* 195* 190* 139* 189*   D-Dimer No results for input(s): DDIMER in the last 72 hours. Hgb A1c No results for input(s): HGBA1C in the last 72 hours. Lipid Profile No results for input(s): CHOL, HDL, LDLCALC, TRIG, CHOLHDL, LDLDIRECT in the last 72 hours. Thyroid function studies No results for input(s): TSH, T4TOTAL, T3FREE, THYROIDAB in the last 72 hours.  Invalid input(s): FREET3 Anemia work up No results for input(s): VITAMINB12, FOLATE, FERRITIN, TIBC, IRON, RETICCTPCT in the last 72 hours. Urinalysis    Component Value Date/Time   COLORURINE YELLOW 07/09/2017 2308   APPEARANCEUR CLEAR 07/09/2017 2308   LABSPEC 1.017 07/09/2017 2308   PHURINE 5.0 07/09/2017 2308   GLUCOSEU NEGATIVE 07/09/2017 2308   HGBUR SMALL (A) 07/09/2017 2308   BILIRUBINUR NEGATIVE 07/09/2017 2308   BILIRUBINUR Negative 02/11/2017 1234   KETONESUR 5 (A) 07/09/2017 2308   PROTEINUR NEGATIVE 07/09/2017 2308   UROBILINOGEN 0.2 02/11/2017 1234   UROBILINOGEN 0.2 01/27/2015 1353   NITRITE NEGATIVE 07/09/2017 2308   LEUKOCYTESUR TRACE (A) 07/09/2017 2308   Sepsis Labs Invalid input(s): PROCALCITONIN,  WBC,  LACTICIDVEN Microbiology Recent Results (from the past 240 hour(s))  Culture, Urine     Status: Abnormal   Collection Time: 07/09/17 11:08 PM  Result Value Ref Range Status   Specimen Description URINE, CLEAN CATCH  Final   Special Requests NONE  Final   Culture >=100,000 COLONIES/mL KLEBSIELLA PNEUMONIAE (A)  Final   Report Status 07/12/2017 FINAL  Final   Organism ID, Bacteria  KLEBSIELLA PNEUMONIAE (A)  Final      Susceptibility   Klebsiella pneumoniae - MIC*    AMPICILLIN >=32 RESISTANT Resistant     CEFAZOLIN <=4 SENSITIVE Sensitive     CEFTRIAXONE <=1 SENSITIVE Sensitive     CIPROFLOXACIN <=0.25 SENSITIVE Sensitive     GENTAMICIN <=1 SENSITIVE Sensitive  IMIPENEM <=0.25 SENSITIVE Sensitive     NITROFURANTOIN 32 SENSITIVE Sensitive     TRIMETH/SULFA <=20 SENSITIVE Sensitive     AMPICILLIN/SULBACTAM 4 SENSITIVE Sensitive     PIP/TAZO <=4 SENSITIVE Sensitive     Extended ESBL NEGATIVE Sensitive     * >=100,000 COLONIES/mL KLEBSIELLA PNEUMONIAE  Blood Culture (routine x 2)     Status: None   Collection Time: 07/09/17 11:28 PM  Result Value Ref Range Status   Specimen Description BLOOD RIGHT HAND  Final   Special Requests   Final    BOTTLES DRAWN AEROBIC AND ANAEROBIC Blood Culture adequate volume   Culture   Final    NO GROWTH 5 DAYS Performed at [facility], [place] [address], [place], [place] [address]    Report Status 07/15/2017 FINAL  Final  Wound or Superficial Culture     Status: None   Collection Time: 07/09/17 11:29 PM  Result Value Ref Range Status   Specimen Description SACRAL  Final   Special Requests NONE  Final   Gram Stain   Final    RARE WBC PRESENT, PREDOMINANTLY MONONUCLEAR ABUNDANT GRAM POSITIVE COCCI IN PAIRS MODERATE GRAM NEGATIVE RODS    Culture   Final    ABUNDANT ENTEROCOCCUS FAECALIS ABUNDANT ACTINOMYCES SPECIES Standardized susceptibility testing for this organism is not available. Performed at [facility], [place] [address] [place], [place] [address]    Report Status 07/16/2017 FINAL  Final   Organism ID, Bacteria ENTEROCOCCUS FAECALIS  Final      Susceptibility   Enterococcus faecalis - MIC*    AMPICILLIN <=2 SENSITIVE Sensitive     VANCOMYCIN 1 SENSITIVE Sensitive     GENTAMICIN SYNERGY SENSITIVE Sensitive     * ABUNDANT ENTEROCOCCUS FAECALIS  Blood Culture (routine x 2)     Status: None   Collection  Time: 07/09/17 11:33 PM  Result Value Ref Range Status   Specimen Description BLOOD LEFT HAND  Final   Special Requests   Final    BOTTLES DRAWN AEROBIC AND ANAEROBIC Blood Culture adequate volume   Culture   Final    NO GROWTH 5 DAYS Performed at [facility], [place] [address], [place], [place] [address]    Report Status 07/15/2017 FINAL  Final  Surgical PCR screen     Status: None   Collection Time: 07/10/17  9:51 PM  Result Value Ref Range Status   MRSA, PCR NEGATIVE NEGATIVE Final   Staphylococcus aureus NEGATIVE NEGATIVE Final    Comment: (NOTE) The Xpert SA Assay (FDA approved for NASAL specimens in patients 28 years of age and older), is one component of a comprehensive surveillance program. It is not intended to diagnose infection nor to guide or monitor treatment.   Aerobic/Anaerobic Culture (surgical/deep wound)     Status: None   Collection Time: 07/11/17 12:47 PM  Result Value Ref Range Status   Specimen Description BONE  Final   Special Requests NONE  Final   Gram Stain   Final    RARE WBC PRESENT, PREDOMINANTLY PMN RARE GRAM POSITIVE COCCI Performed at [facility], [place] [address] [place], [place] [address]    Culture   Final    FEW VIRIDANS STREPTOCOCCUS RARE BACTEROIDES THETAIOTAOMICRON BETA LACTAMASE POSITIVE FEW PEPTOSTREPTOCOCCUS ASACCHAROLYTICUS    Report Status 07/17/2017 FINAL  Final   Organism ID, Bacteria VIRIDANS STREPTOCOCCUS  Final      Susceptibility   Viridans streptococcus - MIC*    PENICILLIN <=  0.06 SENSITIVE Sensitive     CEFTRIAXONE <=0.12 SENSITIVE Sensitive     ERYTHROMYCIN >=8 RESISTANT Resistant     LEVOFLOXACIN 1 SENSITIVE Sensitive     VANCOMYCIN 0.5 SENSITIVE Sensitive     * FEW VIRIDANS STREPTOCOCCUS  Aerobic/Anaerobic Culture (surgical/deep wound)     Status: None   Collection Time: 07/11/17 12:50 PM  Result Value Ref Range Status   Specimen Description WOUND SACRAL  Final   Special Requests NONE  Final   Gram  Stain   Final    RARE WBC PRESENT,BOTH PMN AND MONONUCLEAR NO ORGANISMS SEEN    Culture   Final    FEW VIRIDANS STREPTOCOCCUS SUSCEPTIBILITIES PERFORMED ON PREVIOUS CULTURE WITHIN THE LAST 5 DAYS. FEW PEPTOSTREPTOCOCCUS ASACCHAROLYTICUS RARE BACTEROIDES FRAGILIS BETA LACTAMASE POSITIVE Performed at [facility], [place] [address], [place], [place] [address]    Report Status 07/16/2017 FINAL  Final  Aerobic Culture (superficial specimen)     Status: None   Collection Time: 07/11/17 12:50 PM  Result Value Ref Range Status   Specimen Description WOUND SACRAL  Final   Special Requests NONE  Final   Gram Stain NO WBC SEEN NO ORGANISMS SEEN   Final   Culture   Final    FEW VIRIDANS STREPTOCOCCUS SUSCEPTIBILITIES PERFORMED ON PREVIOUS CULTURE WITHIN THE LAST 5 DAYS. Performed at [facility], [place] [address] [place], [place] [address]    Report Status 07/14/2017 FINAL  Final     Time coordinating discharge: Over 30 minutes  SIGNED:   Hosie Poisson, MD  [facility] 07/18/2017, 11:02 PM Pager   If 7PM-7AM, please contact night-coverage [email or web address] Password [email or web address]

## 2017-07-19 ENCOUNTER — Telehealth: Payer: Self-pay | Admitting: Family Medicine

## 2017-07-19 DIAGNOSIS — I130 Hypertensive heart and chronic kidney disease with heart failure and stage 1 through stage 4 chronic kidney disease, or unspecified chronic kidney disease: Secondary | ICD-10-CM | POA: Diagnosis not present

## 2017-07-19 DIAGNOSIS — E1122 Type 2 diabetes mellitus with diabetic chronic kidney disease: Secondary | ICD-10-CM | POA: Diagnosis not present

## 2017-07-19 DIAGNOSIS — E1169 Type 2 diabetes mellitus with other specified complication: Secondary | ICD-10-CM | POA: Diagnosis not present

## 2017-07-19 DIAGNOSIS — I509 Heart failure, unspecified: Secondary | ICD-10-CM | POA: Diagnosis not present

## 2017-07-19 DIAGNOSIS — F0280 Dementia in other diseases classified elsewhere without behavioral disturbance: Secondary | ICD-10-CM | POA: Diagnosis not present

## 2017-07-19 DIAGNOSIS — L0501 Pilonidal cyst with abscess: Secondary | ICD-10-CM | POA: Diagnosis not present

## 2017-07-19 DIAGNOSIS — N183 Chronic kidney disease, stage 3 (moderate): Secondary | ICD-10-CM | POA: Diagnosis not present

## 2017-07-19 DIAGNOSIS — G309 Alzheimer's disease, unspecified: Secondary | ICD-10-CM | POA: Diagnosis not present

## 2017-07-19 DIAGNOSIS — M4628 Osteomyelitis of vertebra, sacral and sacrococcygeal region: Secondary | ICD-10-CM | POA: Diagnosis not present

## 2017-07-19 NOTE — Telephone Encounter (Signed)
Copied from [place]. Topic: General - Other >> Jul 19, 2017  2:14 PM Vernona Rieger wrote: Reason for CRM:  Ebony Hail from [facility] called and stated that she did her evaluation. Needs to see her once a week for the next four weeks for speech/cognition. Call back number [phone]   I called and left a voice message for Ebony Hail with okay verbal orders.

## 2017-07-19 NOTE — Telephone Encounter (Signed)
Ok for verbal orders ?

## 2017-07-20 ENCOUNTER — Telehealth: Payer: Self-pay | Admitting: Adult Health

## 2017-07-20 ENCOUNTER — Other Ambulatory Visit: Payer: Self-pay | Admitting: Cardiovascular Disease

## 2017-07-20 DIAGNOSIS — F0280 Dementia in other diseases classified elsewhere without behavioral disturbance: Secondary | ICD-10-CM | POA: Diagnosis not present

## 2017-07-20 DIAGNOSIS — M4628 Osteomyelitis of vertebra, sacral and sacrococcygeal region: Secondary | ICD-10-CM | POA: Diagnosis not present

## 2017-07-20 DIAGNOSIS — G309 Alzheimer's disease, unspecified: Secondary | ICD-10-CM | POA: Diagnosis not present

## 2017-07-20 DIAGNOSIS — I509 Heart failure, unspecified: Secondary | ICD-10-CM | POA: Diagnosis not present

## 2017-07-20 DIAGNOSIS — L0501 Pilonidal cyst with abscess: Secondary | ICD-10-CM | POA: Diagnosis not present

## 2017-07-20 DIAGNOSIS — E1122 Type 2 diabetes mellitus with diabetic chronic kidney disease: Secondary | ICD-10-CM | POA: Diagnosis not present

## 2017-07-20 DIAGNOSIS — I130 Hypertensive heart and chronic kidney disease with heart failure and stage 1 through stage 4 chronic kidney disease, or unspecified chronic kidney disease: Secondary | ICD-10-CM | POA: Diagnosis not present

## 2017-07-20 DIAGNOSIS — N183 Chronic kidney disease, stage 3 (moderate): Secondary | ICD-10-CM | POA: Diagnosis not present

## 2017-07-20 DIAGNOSIS — E1169 Type 2 diabetes mellitus with other specified complication: Secondary | ICD-10-CM | POA: Diagnosis not present

## 2017-07-20 NOTE — Telephone Encounter (Signed)
Copied from [name]. Topic: Quick Communication - Rx Refill/Question >> Jul 20, 2017 12:58 PM Scherrie Gerlach wrote: Pt request refill   traMADol (ULTRAM) 50 MG tablet  Daughter states pt was discharged from the hospital with this medicine.  [facility] has started moving her, pt has a wound and a midline for abx. Pt has been in the bed 8 days. Pt has appt on 08/02/2017 to see [facility]. But [name] daughter states [facility] recommend she call and get this refilled.  Only sent home with 10 tabs/ discharged Monday  [place], [place] [phone] (Phone) [phone] (Fax)     Agent: Please be advised that RX refills may take up to 48 hours. We ask that you follow-up with your pharmacy.

## 2017-07-20 NOTE — Telephone Encounter (Signed)
Tramadol refill

## 2017-07-20 NOTE — Telephone Encounter (Signed)
Please review for refill, thanks ! 

## 2017-07-21 ENCOUNTER — Other Ambulatory Visit: Payer: Self-pay | Admitting: Family Medicine

## 2017-07-21 ENCOUNTER — Telehealth: Payer: Self-pay | Admitting: *Deleted

## 2017-07-21 ENCOUNTER — Encounter (HOSPITAL_COMMUNITY): Payer: Self-pay | Admitting: Radiology

## 2017-07-21 ENCOUNTER — Other Ambulatory Visit: Payer: Self-pay | Admitting: Adult Health

## 2017-07-21 ENCOUNTER — Telehealth: Payer: Self-pay | Admitting: Family Medicine

## 2017-07-21 ENCOUNTER — Ambulatory Visit (HOSPITAL_COMMUNITY)
Admission: RE | Admit: 2017-07-21 | Discharge: 2017-07-21 | Disposition: A | Discharge status: Home / Self Care | Payer: Medicare HMO | Source: Ambulatory Visit | Attending: Family Medicine | Admitting: Family Medicine

## 2017-07-21 DIAGNOSIS — I878 Other specified disorders of veins: Secondary | ICD-10-CM | POA: Insufficient documentation

## 2017-07-21 DIAGNOSIS — I130 Hypertensive heart and chronic kidney disease with heart failure and stage 1 through stage 4 chronic kidney disease, or unspecified chronic kidney disease: Secondary | ICD-10-CM | POA: Diagnosis not present

## 2017-07-21 DIAGNOSIS — I509 Heart failure, unspecified: Secondary | ICD-10-CM | POA: Diagnosis not present

## 2017-07-21 DIAGNOSIS — L0501 Pilonidal cyst with abscess: Secondary | ICD-10-CM | POA: Diagnosis not present

## 2017-07-21 DIAGNOSIS — F0280 Dementia in other diseases classified elsewhere without behavioral disturbance: Secondary | ICD-10-CM | POA: Diagnosis not present

## 2017-07-21 DIAGNOSIS — L8992 Pressure ulcer of unspecified site, stage 2: Secondary | ICD-10-CM | POA: Diagnosis not present

## 2017-07-21 DIAGNOSIS — E1169 Type 2 diabetes mellitus with other specified complication: Secondary | ICD-10-CM | POA: Diagnosis not present

## 2017-07-21 DIAGNOSIS — M4628 Osteomyelitis of vertebra, sacral and sacrococcygeal region: Secondary | ICD-10-CM | POA: Diagnosis not present

## 2017-07-21 DIAGNOSIS — E1122 Type 2 diabetes mellitus with diabetic chronic kidney disease: Secondary | ICD-10-CM | POA: Diagnosis not present

## 2017-07-21 DIAGNOSIS — S31809A Unspecified open wound of unspecified buttock, initial encounter: Secondary | ICD-10-CM | POA: Diagnosis not present

## 2017-07-21 DIAGNOSIS — R7881 Bacteremia: Secondary | ICD-10-CM

## 2017-07-21 DIAGNOSIS — Z452 Encounter for adjustment and management of vascular access device: Secondary | ICD-10-CM | POA: Diagnosis not present

## 2017-07-21 DIAGNOSIS — N183 Chronic kidney disease, stage 3 (moderate): Secondary | ICD-10-CM | POA: Diagnosis not present

## 2017-07-21 DIAGNOSIS — G309 Alzheimer's disease, unspecified: Secondary | ICD-10-CM | POA: Diagnosis not present

## 2017-07-21 HISTORY — PX: IR FLUORO GUIDE CV MIDLINE PICC RIGHT: IMG5212

## 2017-07-21 HISTORY — PX: IR US GUIDE VASC ACCESS RIGHT: IMG2390

## 2017-07-21 MED ORDER — CHLORHEXIDINE GLUCONATE 4 % EX LIQD
CUTANEOUS | Status: AC
  Filled 2017-07-21: qty 15

## 2017-07-21 MED ORDER — LIDOCAINE HCL (PF) 1 % IJ SOLN
INTRAMUSCULAR | Status: DC | PRN
  Administered 2017-07-21: 5 mL

## 2017-07-21 MED ORDER — LIDOCAINE HCL 1 % IJ SOLN
INTRAMUSCULAR | Status: AC
  Filled 2017-07-21: qty 20

## 2017-07-21 MED ORDER — HEPARIN SOD (PORK) LOCK FLUSH 100 UNIT/ML IV SOLN
INTRAVENOUS | Status: AC
  Filled 2017-07-21: qty 5

## 2017-07-21 MED ORDER — TRAMADOL HCL 50 MG PO TABS: 50.0000 mg | ORAL_TABLET | Freq: Two times a day (BID) | ORAL | 0 refills | Status: DC | PRN

## 2017-07-21 NOTE — Telephone Encounter (Signed)
Rx called to the pharmacy and left on machine.

## 2017-07-21 NOTE — Telephone Encounter (Signed)
Spoke with Anna [name] and no  verbal orders or orders given.

## 2017-07-21 NOTE — Telephone Encounter (Signed)
-----   Message from Dorothyann Peng, NP sent at 07/21/2017 12:34 PM EST ----- I have done urgent referrals to ID and Wound Care since the hospital never did.   Please have her call, if she hasnt yet   [facility] Follow up in 4 week(s).  Specialty:  General Surgery Contact information: [address] [place] [phone]

## 2017-07-21 NOTE — Telephone Encounter (Signed)
I called and spoke to Butch Penny at [facility].  She will send a message to triage nursing staff to call and schedule the pt for hospital follow up.  [facility] notified.

## 2017-07-21 NOTE — Telephone Encounter (Signed)
Paty (nurse from [facility])  Would like an order faxed for either a mid line or a pic placement faxed to [phone] and would like a call back to schedule transportation for the patient.  [phone].

## 2017-07-21 NOTE — Telephone Encounter (Signed)
Ok to refill for 30 days, take BID PRN   Please remind family that she needs to get out of bed as much as possible so the wound does not get bigger

## 2017-07-21 NOTE — Telephone Encounter (Signed)
Her wound care team needs to place that order. I am not going to be managing this

## 2017-07-21 NOTE — Procedures (Signed)
Successful placement of single lumen PICC line to right basilic vein. Length 36 cm Tip at lower SVC/RA No complications Ready for use.  Ascencion Dike PA-C 4:05 PM

## 2017-07-24 DIAGNOSIS — F0280 Dementia in other diseases classified elsewhere without behavioral disturbance: Secondary | ICD-10-CM | POA: Diagnosis not present

## 2017-07-24 DIAGNOSIS — L0231 Cutaneous abscess of buttock: Secondary | ICD-10-CM | POA: Diagnosis not present

## 2017-07-24 DIAGNOSIS — G309 Alzheimer's disease, unspecified: Secondary | ICD-10-CM | POA: Diagnosis not present

## 2017-07-24 DIAGNOSIS — I509 Heart failure, unspecified: Secondary | ICD-10-CM | POA: Diagnosis not present

## 2017-07-24 DIAGNOSIS — E1122 Type 2 diabetes mellitus with diabetic chronic kidney disease: Secondary | ICD-10-CM | POA: Diagnosis not present

## 2017-07-24 DIAGNOSIS — L0501 Pilonidal cyst with abscess: Secondary | ICD-10-CM | POA: Diagnosis not present

## 2017-07-24 DIAGNOSIS — N183 Chronic kidney disease, stage 3 (moderate): Secondary | ICD-10-CM | POA: Diagnosis not present

## 2017-07-24 DIAGNOSIS — M4628 Osteomyelitis of vertebra, sacral and sacrococcygeal region: Secondary | ICD-10-CM | POA: Diagnosis not present

## 2017-07-24 DIAGNOSIS — E1169 Type 2 diabetes mellitus with other specified complication: Secondary | ICD-10-CM | POA: Diagnosis not present

## 2017-07-24 DIAGNOSIS — I130 Hypertensive heart and chronic kidney disease with heart failure and stage 1 through stage 4 chronic kidney disease, or unspecified chronic kidney disease: Secondary | ICD-10-CM | POA: Diagnosis not present

## 2017-07-25 ENCOUNTER — Ambulatory Visit: Payer: Self-pay

## 2017-07-25 ENCOUNTER — Telehealth: Payer: Self-pay | Admitting: Adult Health

## 2017-07-25 ENCOUNTER — Encounter (HOSPITAL_BASED_OUTPATIENT_CLINIC_OR_DEPARTMENT_OTHER): Payer: Medicare HMO

## 2017-07-25 ENCOUNTER — Telehealth: Payer: Self-pay | Admitting: Family Medicine

## 2017-07-25 DIAGNOSIS — M4628 Osteomyelitis of vertebra, sacral and sacrococcygeal region: Secondary | ICD-10-CM | POA: Diagnosis not present

## 2017-07-25 DIAGNOSIS — I130 Hypertensive heart and chronic kidney disease with heart failure and stage 1 through stage 4 chronic kidney disease, or unspecified chronic kidney disease: Secondary | ICD-10-CM | POA: Diagnosis not present

## 2017-07-25 DIAGNOSIS — I509 Heart failure, unspecified: Secondary | ICD-10-CM | POA: Diagnosis not present

## 2017-07-25 DIAGNOSIS — E1122 Type 2 diabetes mellitus with diabetic chronic kidney disease: Secondary | ICD-10-CM | POA: Diagnosis not present

## 2017-07-25 DIAGNOSIS — F0280 Dementia in other diseases classified elsewhere without behavioral disturbance: Secondary | ICD-10-CM | POA: Diagnosis not present

## 2017-07-25 DIAGNOSIS — L0501 Pilonidal cyst with abscess: Secondary | ICD-10-CM | POA: Diagnosis not present

## 2017-07-25 DIAGNOSIS — G309 Alzheimer's disease, unspecified: Secondary | ICD-10-CM | POA: Diagnosis not present

## 2017-07-25 DIAGNOSIS — N183 Chronic kidney disease, stage 3 (moderate): Secondary | ICD-10-CM | POA: Diagnosis not present

## 2017-07-25 DIAGNOSIS — E1169 Type 2 diabetes mellitus with other specified complication: Secondary | ICD-10-CM | POA: Diagnosis not present

## 2017-07-25 NOTE — Telephone Encounter (Signed)
Have her stop the Myrbetriq at this time and have home care recheck when they are back.

## 2017-07-25 NOTE — Telephone Encounter (Signed)
Called and informed to proceed with OT orders

## 2017-07-25 NOTE — Telephone Encounter (Signed)
Copied from [place] [id number]. Topic: Quick Communication - See Telephone Encounter >> Jul 25, 2017 11:03 AM Arletha Grippe wrote: CRM for notification. See Telephone encounter for:   07/25/17.catherine from [facility] called - requesting verbal orders for home health OT 1 week 4  [phone] cb number

## 2017-07-25 NOTE — Telephone Encounter (Signed)
Copied from [place] [id number]. Topic: Quick Communication - See Telephone Encounter >> Jul 25, 2017 11:03 AM Arletha Grippe wrote: CRM for notification. See Telephone encounter for:   07/25/17.catherine from [facility] called - requesting verbal orders for home health OT 1 week 4  [phone] cb number   >> Jul 25, 2017  1:07 PM Tye [place] wrote: Dorthula Perfect from [facility] [phone] called to inform pt's blood pressure is elevated 184/90 and abnormal lung sounds left lower lobe is diminished, right lung is abnormal crackling sounds

## 2017-07-25 NOTE — Telephone Encounter (Signed)
Ok for verbal orders ?

## 2017-07-25 NOTE — Telephone Encounter (Signed)
Tried calling [name] (daughter) but received a message that the mailbox is full.  Will try again at a later time.

## 2017-07-25 NOTE — Telephone Encounter (Signed)
Phone call from Speech Therapist, with [facility], to report elevated BP of 182/90 @ 10:20 AM.  BP 186/85 @ 10:40 AM.  Stated that her BP has been in the lower range of normal, previously.  Speech Therapist reports the pt. is lethargic, and that daughter reports that  pt. has had a slow decline, since discharge from hosp. on 11/26.  Reported that the pt. was able to sit up approx. 20 min. yesterday, although very weak, and was more alert for a short period of time.  Reported requires moderate- maximum asst. to transfer. Also, pt. ate some eggs/ toast and took sips of fluids this morning.  Other VS: T. 98 degrees, P. 91(irreg), R. 20-22, skin warm/ dry.  O2 sat. 94 % on RA.  Reported lungs sound clear with auscultation, but that occasional audible wheeze noted, when just listening to her breathe  Speech Therapist noted that the Myrbetriq has listed elevated BP as a known adverse effect.  Pt. Has dementia, but when questioned denies having any pain.  Per High Blood Pressure protocol, advised the pt. Should be seen in the office within 24 hrs.  Per Speech Therapist, the daughter is unsure about bringing pt. To the office.  Stated it would be difficult to transport her there, and unsure if it would be necessary and productive.  Advised can forward message to provider to make him aware of elevated BP and overall decline in condition.  Per ST, daughter is in agreement.             Reason for Disposition . Systolic BP  >= 938 OR Diastolic >= 182  Answer Assessment - Initial Assessment Questions 1. BLOOD PRESSURE: "What is the blood pressure?" "Did you take at least two measurements 5 minutes apart?"     182/90 x 2   2. ONSET: "When did you take your blood pressure?"     10:20 AM 3. HOW: "How did you obtain the blood pressure?" (e.g., visiting nurse, automatic home BP monitor)     Manual cuff per Occupational Therapist 4. HISTORY: "Do you have a history of high blood pressure?"     Hx of hypertension; on  Myovetric   5. MEDICATIONS: "Are you taking any medications for blood pressure?" "Have you missed any doses recently?"     No 6. OTHER SYMPTOMS: "Do you have any symptoms?" (e.g., headache, chest pain, blurred vision, difficulty breathing, weakness)     Has wound at coccyx with new area of breakdown; no additional pain at this time. Pt. Lethargic / OT stated this is her 2nd visit 7. PREGNANCY: "Is there any chance you are pregnant?" "When was your last menstrual period?"     no  Protocols used: HIGH BLOOD PRESSURE-A-AH

## 2017-07-25 NOTE — Telephone Encounter (Signed)
Copied from [place] [phone]. Topic: General - Other >> Jul 25, 2017  2:48 PM Bea Graff, NT wrote: Reason for CRM: Patty from [facility] calling and stated pt declining home health aid so they will be cancelling orders for home health aid to come out. CB#: [phone]

## 2017-07-26 ENCOUNTER — Telehealth: Payer: Self-pay | Admitting: Adult Health

## 2017-07-26 ENCOUNTER — Ambulatory Visit: Payer: Self-pay

## 2017-07-26 ENCOUNTER — Other Ambulatory Visit: Payer: Self-pay | Admitting: Adult Health

## 2017-07-26 DIAGNOSIS — M4628 Osteomyelitis of vertebra, sacral and sacrococcygeal region: Secondary | ICD-10-CM | POA: Diagnosis not present

## 2017-07-26 DIAGNOSIS — G309 Alzheimer's disease, unspecified: Secondary | ICD-10-CM | POA: Diagnosis not present

## 2017-07-26 DIAGNOSIS — N183 Chronic kidney disease, stage 3 (moderate): Secondary | ICD-10-CM | POA: Diagnosis not present

## 2017-07-26 DIAGNOSIS — L0501 Pilonidal cyst with abscess: Secondary | ICD-10-CM | POA: Diagnosis not present

## 2017-07-26 DIAGNOSIS — E1122 Type 2 diabetes mellitus with diabetic chronic kidney disease: Secondary | ICD-10-CM | POA: Diagnosis not present

## 2017-07-26 DIAGNOSIS — I130 Hypertensive heart and chronic kidney disease with heart failure and stage 1 through stage 4 chronic kidney disease, or unspecified chronic kidney disease: Secondary | ICD-10-CM | POA: Diagnosis not present

## 2017-07-26 DIAGNOSIS — F0280 Dementia in other diseases classified elsewhere without behavioral disturbance: Secondary | ICD-10-CM | POA: Diagnosis not present

## 2017-07-26 DIAGNOSIS — E1169 Type 2 diabetes mellitus with other specified complication: Secondary | ICD-10-CM | POA: Diagnosis not present

## 2017-07-26 DIAGNOSIS — I509 Heart failure, unspecified: Secondary | ICD-10-CM | POA: Diagnosis not present

## 2017-07-26 MED ORDER — HYDROCODONE-ACETAMINOPHEN 5-325 MG PO TABS: ORAL_TABLET | ORAL | 0 refills | Status: DC

## 2017-07-26 NOTE — Telephone Encounter (Signed)
Sunday Spillers do you mind calling daughter and letting her know that I have no problem doing a one time dose of stronger pain medication, but it will be a three day course. She needs to follow up with surgery for other refills since they are managing her wound. I am not going to try and manage her wheezing over the phone. She is going to have to come in or go to the ER if it gets bad enough   I am pretty sure that home health does not do portable chest xrays.

## 2017-07-26 NOTE — Telephone Encounter (Signed)
Noted  

## 2017-07-26 NOTE — Telephone Encounter (Signed)
Ok for verbal order  °

## 2017-07-26 NOTE — Telephone Encounter (Signed)
Nikki RN states pt was in pain during wound care and BP was elevated Per RN sounded diminished LLL and crackles RLL. Pt resides with daughter.

## 2017-07-26 NOTE — Telephone Encounter (Signed)
Called [facility] and relayed pt information to [place] who will try and address daughter and pt's concerns and issues.

## 2017-07-26 NOTE — Telephone Encounter (Signed)
Please advise 

## 2017-07-26 NOTE — Telephone Encounter (Signed)
Copied from [place]. Topic: Quick Communication - See Telephone Encounter >> Jul 26, 2017  8:34 AM Arletha Grippe wrote: CRM for notification. See Telephone encounter for:   07/26/17. Nicky [facility] called - pt has bp that was high yesterday, bp was lowered to 174/82, her lung sounds were abnormal. Bp lowered after the pt was given dose of medication. Daughter did give extra dose of lasix afternoon. Cb number is [phone] for [facility] - home health. Nurse is not with patient right now, was calling to let us know and follow up.  Tried multiple attempts to contact nurse triage for several minutes, nurse Antony Madura just wants to have call back about pt.  Please call [phone]

## 2017-07-26 NOTE — Telephone Encounter (Signed)
Copied from [place] [id number]. Topic: Quick Communication - See Telephone Encounter >> Jul 26, 2017  3:38 PM Boyd Kerbs wrote: CRM for notification. See Telephone encounter for:  Margaretha Sheffield [facility] [phone] asking for Verbal Order for Barer Cream to use on outside of open wound 07/26/17.

## 2017-07-26 NOTE — Telephone Encounter (Signed)
Spoke with daughter who is concerned that her mother is having wheezing that she states is better today. Daughter has been giving more Lasix and she thinks that has helped. Daughter is going to give 30 mg of Lasix and plans to give an extra dose on 10 mg in case she wheezes more. Per [facility] BP [phone]) was 174/82. But later in the evening her BP was better 136/86 HR 83 taken last night. [facility] nurse stated she notted crackles to her RLL lung base.  Daughter states that she is SOB at rest. Daughter is requesting an order for [facility] to obtain a portable Chest xray. Daughter stated she had elevated BP's yesterday but thinks may be related to "poor pain control". O2 sats now are 94 % HR 86.   BP taken while on triage call is : 152/93 HR 80. Pt got pain med at 0900.   Advised that pt needs to be seen today within  4 hours but daughter states she just wants a order so that [facility] can do a portable xray. Daughter thinks BP elevation is related to pain. Presently taking Ultram and daughter spoke with pharmacist that the pain medicine "doesn't have a lot of pain medicine in it.  Reason for Disposition . [1] Longstanding difficulty breathing (e.g., CHF, COPD, emphysema) AND [2] WORSE than normal  Additional Information . Negative: [1] MODERATE difficulty breathing (e.g., speaks in phrases, SOB even at rest, pulse 100-120) AND [2] NEW-onset or WORSE than normal    SOB at rest  Daughter wanting a order for portable cxr  Answer Assessment - Initial Assessment Questions 1. RESPIRATORY STATUS: "Describe your breathing?" (e.g., wheezing, shortness of breath, unable to speak, severe coughing)      wheezing 2. ONSET: "When did this breathing problem begin?"    07/23/17 3. PATTERN "Does the difficult breathing come and go, or has it been constant since it started?"      Constant but after Lasix it improves 4. SEVERITY: "How bad is your breathing?" (e.g., mild, moderate,  severe)    - MILD: No SOB at rest, mild SOB with walking, speaks normally in sentences, can lay down, no retractions, pulse < 100.    - MODERATE: SOB at rest, SOB with minimal exertion and prefers to sit, cannot lie down flat, speaks in phrases, mild retractions, audible wheezing, pulse 100-120.    - SEVERE: Very SOB at rest, speaks in single words, struggling to breathe, sitting hunched forward, retractions, pulse > 120      Mild No SOB but is wheezing 5. RECURRENT SYMPTOM: "Have you had difficulty breathing before?" If so, ask: "When was the last time?" and "What happened that time?"      Yes  Has been happening more often 6. CARDIAC HISTORY: "Do you have any history of heart disease?" (e.g., heart attack, angina, bypass surgery, angioplasty)      CHF and A fib  7. LUNG HISTORY: "Do you have any history of lung disease?"  (e.g., pulmonary embolus, asthma, emphysema)     No  8. CAUSE: "What do you think is causing the breathing problem?"      CHF and lying in bed  9. OTHER SYMPTOMS: "Do you have any other symptoms? (e.g., dizziness, runny nose, cough, chest pain, fever)     No  10. PREGNANCY: "Is there any chance you are pregnant?" "When was your last menstrual period?"       n/a 11. TRAVEL: "Have  you traveled out of the country in the last month?" (e.g., travel history, exposures)       no  Protocols used: BREATHING DIFFICULTY-A-AH

## 2017-07-26 NOTE — Telephone Encounter (Signed)
Pt's daughter, [name], notified of instructions and verbalized understanding.  Ebony Hail at [facility] also notified of instructions.

## 2017-07-26 NOTE — Telephone Encounter (Signed)
Copied from [place]. Topic: Quick Communication - Rx Refill/Question >> Jul 26, 2017 11:16 AM Scherrie Gerlach wrote: Daughter states the tramadol is just not working.   States [facility] just left and agrees pt needs something stronger.  They need something else (poss norco) for the pt's pain. They need to be able to move her, and every time they move the pt she screams. If they get her pain under control, pt may be able to move. Also [facility] agrees pt's bp issue is due to all the pain pt is in.  Daughter is stating over and over pt needs something more for pain than she is on. If pt able to move , they will be able to get some of the fluid out of her and getting her up and around. [name] states she is 3 min away and she can pick up script asap

## 2017-07-27 ENCOUNTER — Encounter (HOSPITAL_COMMUNITY): Payer: Self-pay | Admitting: Emergency Medicine

## 2017-07-27 ENCOUNTER — Emergency Department (HOSPITAL_COMMUNITY): Payer: Medicare HMO

## 2017-07-27 ENCOUNTER — Emergency Department (HOSPITAL_COMMUNITY)
Admission: EM | Admit: 2017-07-27 | Discharge: 2017-07-27 | Disposition: A | Discharge status: Home / Self Care | Payer: Medicare HMO | Attending: Emergency Medicine | Admitting: Emergency Medicine

## 2017-07-27 DIAGNOSIS — L0502 Pilonidal sinus with abscess: Secondary | ICD-10-CM | POA: Insufficient documentation

## 2017-07-27 DIAGNOSIS — R918 Other nonspecific abnormal finding of lung field: Secondary | ICD-10-CM | POA: Diagnosis not present

## 2017-07-27 DIAGNOSIS — Y802 Prosthetic and other implants, materials and accessory physical medicine devices associated with adverse incidents: Secondary | ICD-10-CM | POA: Insufficient documentation

## 2017-07-27 DIAGNOSIS — Z79899 Other long term (current) drug therapy: Secondary | ICD-10-CM | POA: Insufficient documentation

## 2017-07-27 DIAGNOSIS — I130 Hypertensive heart and chronic kidney disease with heart failure and stage 1 through stage 4 chronic kidney disease, or unspecified chronic kidney disease: Secondary | ICD-10-CM | POA: Insufficient documentation

## 2017-07-27 DIAGNOSIS — Z7984 Long term (current) use of oral hypoglycemic drugs: Secondary | ICD-10-CM | POA: Diagnosis not present

## 2017-07-27 DIAGNOSIS — G309 Alzheimer's disease, unspecified: Secondary | ICD-10-CM | POA: Diagnosis not present

## 2017-07-27 DIAGNOSIS — Z7982 Long term (current) use of aspirin: Secondary | ICD-10-CM | POA: Diagnosis not present

## 2017-07-27 DIAGNOSIS — R4182 Altered mental status, unspecified: Secondary | ICD-10-CM | POA: Diagnosis not present

## 2017-07-27 DIAGNOSIS — T83498A Other mechanical complication of other prosthetic devices, implants and grafts of genital tract, initial encounter: Secondary | ICD-10-CM | POA: Diagnosis not present

## 2017-07-27 DIAGNOSIS — J810 Acute pulmonary edema: Secondary | ICD-10-CM | POA: Insufficient documentation

## 2017-07-27 DIAGNOSIS — T83198A Other mechanical complication of other urinary devices and implants, initial encounter: Secondary | ICD-10-CM | POA: Diagnosis not present

## 2017-07-27 DIAGNOSIS — N183 Chronic kidney disease, stage 3 (moderate): Secondary | ICD-10-CM | POA: Diagnosis not present

## 2017-07-27 DIAGNOSIS — L8992 Pressure ulcer of unspecified site, stage 2: Secondary | ICD-10-CM | POA: Diagnosis not present

## 2017-07-27 DIAGNOSIS — T82838A Hemorrhage of vascular prosthetic devices, implants and grafts, initial encounter: Secondary | ICD-10-CM | POA: Diagnosis not present

## 2017-07-27 DIAGNOSIS — B999 Unspecified infectious disease: Secondary | ICD-10-CM | POA: Diagnosis not present

## 2017-07-27 DIAGNOSIS — F0280 Dementia in other diseases classified elsewhere without behavioral disturbance: Secondary | ICD-10-CM | POA: Diagnosis not present

## 2017-07-27 DIAGNOSIS — E1122 Type 2 diabetes mellitus with diabetic chronic kidney disease: Secondary | ICD-10-CM | POA: Diagnosis not present

## 2017-07-27 DIAGNOSIS — I509 Heart failure, unspecified: Secondary | ICD-10-CM | POA: Diagnosis not present

## 2017-07-27 LAB — CBC WITH DIFFERENTIAL/PLATELET
Basophils Absolute: 0 10*3/uL (ref 0.0–0.1)
Basophils Relative: 1 %
EOS ABS: 0.2 10*3/uL (ref 0.0–0.7)
Eosinophils Relative: 3 %
HCT: 41.3 % (ref 36.0–46.0)
HEMOGLOBIN: 13.3 g/dL (ref 12.0–15.0)
LYMPHS ABS: 0.8 10*3/uL (ref 0.7–4.0)
Lymphocytes Relative: 12 %
MCH: 31.3 pg (ref 26.0–34.0)
MCHC: 32.2 g/dL (ref 30.0–36.0)
MCV: 97.2 fL (ref 78.0–100.0)
Monocytes Absolute: 0.6 10*3/uL (ref 0.1–1.0)
Monocytes Relative: 9 %
NEUTROS PCT: 75 %
Neutro Abs: 5.1 10*3/uL (ref 1.7–7.7)
Platelets: 194 10*3/uL (ref 150–400)
RBC: 4.25 MIL/uL (ref 3.87–5.11)
RDW: 14.4 % (ref 11.5–15.5)
WBC: 6.6 10*3/uL (ref 4.0–10.5)

## 2017-07-27 LAB — COMPREHENSIVE METABOLIC PANEL
ALBUMIN: 3.1 g/dL — AB (ref 3.5–5.0)
ALK PHOS: 75 U/L (ref 38–126)
ALT: 15 U/L (ref 14–54)
AST: 16 U/L (ref 15–41)
Anion gap: 10 (ref 5–15)
BUN: 19 mg/dL (ref 6–20)
CALCIUM: 9.3 mg/dL (ref 8.9–10.3)
CO2: 25 mmol/L (ref 22–32)
CREATININE: 1.07 mg/dL — AB (ref 0.44–1.00)
Chloride: 105 mmol/L (ref 101–111)
GFR calc Af Amer: 53 mL/min — ABNORMAL LOW (ref >60)
GFR calc non Af Amer: 46 mL/min — ABNORMAL LOW (ref >60)
GLUCOSE: 163 mg/dL — AB (ref 65–99)
Potassium: 3.4 mmol/L — ABNORMAL LOW (ref 3.5–5.1)
SODIUM: 140 mmol/L (ref 135–145)
Total Bilirubin: 1.2 mg/dL (ref 0.3–1.2)
Total Protein: 6.5 g/dL (ref 6.5–8.1)

## 2017-07-27 LAB — BRAIN NATRIURETIC PEPTIDE: B Natriuretic Peptide: 246.8 pg/mL — ABNORMAL HIGH (ref 0.0–100.0)

## 2017-07-27 MED ORDER — HYDROCODONE-ACETAMINOPHEN 5-325 MG PO TABS
1.0000 | ORAL_TABLET | Freq: Once | ORAL | Status: AC
  Administered 2017-07-27: 0.5 via ORAL
  Filled 2017-07-27: qty 1

## 2017-07-27 MED ORDER — FUROSEMIDE 10 MG/ML IJ SOLN
40.0000 mg | Freq: Once | INTRAMUSCULAR | Status: AC
  Administered 2017-07-27: 40 mg via INTRAVENOUS
  Filled 2017-07-27: qty 4

## 2017-07-27 NOTE — ED Notes (Signed)
PTAR made aware of transport needed back to residence.

## 2017-07-27 NOTE — Telephone Encounter (Signed)
Verbal orders called to [facility] as directed.

## 2017-07-27 NOTE — ED Triage Notes (Signed)
Per [facility] patient from home where she woke noticing blood under tegaderm at PICC line site and concerned it got pulled during the night.  Patient getting IV antibiotics with [facility] and called them this morning about PICC line but was told to call 911.

## 2017-07-27 NOTE — ED Provider Notes (Signed)
[facility] DEPT Provider Note   CSN: [id number] Arrival date & time: 07/27/17  0830     History   Chief Complaint Chief Complaint  Patient presents with  . PICC line check    HPI Anna [name] is a 83 y.o. female.  HPI Patient is 3 days from completing 2-week course of IV Unasyn for coccygeal abscess.  Patient's daughter has been administering the antibiotic.  States she is the PICC line in the right upper extremity yesterday evening without any difficulty.  This morning she noticed blood surrounding the insertion site of the PICC line.  Called home health nurse who recommended patient come to the emergency department for evaluation.  She has ongoing coccygeal discomfort.  Recently prescribed Norco for the pain but has yet to take this.  Has not had her morning medications including metoprolol.  Patient has a history of Alzheimer's and is unable to contribute to history.  Level 5 caveat. Past Medical History:  Diagnosis Date  . Alzheimer's disease   . Atrial fibrillation ([place])   . Bronchitis   . CHF (congestive heart failure) ([place])   . Chronic renal insufficiency, stage III (moderate) (HCC)   . Diabetes mellitus without complication ([place])   . Dyspnea    with exertion  . GERD (gastroesophageal reflux disease)   . GI bleeding 11/2013   due to supratherapeutic INR  . Hyperlipidemia   . Hypertension   . Hyperthyroidism    on amiodarone  . LBBB (left bundle branch block)   . Overactive bladder   . Overweight   . Persistent atrial fibrillation ([place])   . Pneumonia   . Thyroid mass    LLL  . UTI (urinary tract infection)   . Vitamin B12 deficiency   . Vitamin D deficiency     Patient Active Problem List   Diagnosis Date Noted  . Acute metabolic encephalopathy 62/22/9798  . Alzheimer's dementia with behavioral disturbance 07/10/2017  . Pilonidal abscess 07/10/2017  . Abscess of coccyx 07/10/2017  . Abnormal urine sediment 02/11/2017  .  Upper airway cough syndrome 01/06/2017  . Acute on chronic diastolic heart failure ([place])   . Atrial fibrillation ([place]) 09/15/2016  . Atrial fibrillation with RVR ([place]) 09/15/2016  . Near syncope 02/05/2016  . Pre-syncope 02/05/2016  . Chest pain   . Diabetes mellitus with complication ([place])   . Hypokalemia 08/28/2015  . UTI (lower urinary tract infection) 08/26/2015  . Generalized weakness 08/26/2015  . Acute combined systolic and diastolic heart failure ([place]) 02/16/2015  . Hyperthyroidism 01/28/2015  . Dyspnea 01/27/2015  . SOB (shortness of breath) 01/27/2015  . Failure to thrive in adult 01/27/2015  . Benign essential HTN 01/27/2015  . DM (diabetes mellitus) type II controlled with renal manifestation ([place]) 01/27/2015  . CKD (chronic kidney disease), stage III ([place]) 01/27/2015    Past Surgical History:  Procedure Laterality Date  . ABDOMINAL HYSTERECTOMY    . ESOPHAGOGASTRODUODENOSCOPY (EGD) WITH PROPOFOL N/A 02/16/2017   Procedure: ESOPHAGOGASTRODUODENOSCOPY (EGD) WITH PROPOFOL;  Surgeon: Irene Shipper, MD;  Location: [facility] ENDOSCOPY;  Service: Endoscopy;  Laterality: N/A;  . IR FLUORO GUIDE CV MIDLINE PICC RIGHT  07/21/2017  . IR US GUIDE VASC ACCESS RIGHT  07/21/2017  . IRRIGATION AND DEBRIDEMENT ABSCESS N/A 07/11/2017   Procedure: IRRIGATION AND DEBRIDEMENT OF SACRAL ABSCESS;  Surgeon: Ralene Ok, MD;  Location: [facility] ORS;  Service: General;  Laterality: N/A;  SACRUM    OB History    No data available  Home Medications    Prior to Admission medications   Medication Sig Start Date End Date Taking? Authorizing Provider  ACCU-CHEK AVIVA PLUS test strip PATIENT TO CHECK SUGARS ONCE DAILY. 11/19/15  Yes [provider]  acetaminophen (TYLENOL) 500 MG tablet Take 500 mg by mouth every 6 (six) hours as needed for fever.    Yes [provider]  ampicillin-sulbactam (UNASYN) IVPB Inject 3 g into the vein every 6 (six) hours. Indication:  Coccyx  abscess Last Day of Therapy:  07/28/2017 Labs - Once weekly:  CBC/D and BMP, Labs - Every other week:  ESR and CRP Patient taking differently: Inject 3 g into the vein every 6 (six) hours. Started 11/26 to 12/09 Indication:  Coccyx abscess Last Day of Therapy:  07/28/2017 Labs - Once weekly:  CBC/D and BMP, Labs - Every other week:  ESR and CRP 07/16/17  Yes Hosie Poisson, MD  aspirin 325 MG tablet Take 325 mg by mouth daily.   Yes [provider]  calcitRIOL (ROCALTROL) 0.5 MCG capsule Take 1 capsule (0.5 mcg total) by mouth 3 (three) times a week. Monday, Wednesday, and Friday only 01/06/17  Yes Burchette, Alinda Sierras, MD  carbamide peroxide (DEBROX) 6.5 % OTIC solution Place 5 drops into both ears 2 (two) times daily. Patient taking differently: Place 5 drops 2 (two) times daily as needed into both ears (irritation).  03/22/17  Yes Nafziger, Tommi Rumps, NP  cholecalciferol (VITAMIN D) 1000 units tablet Take 1,000 Units by mouth daily.   Yes [provider]  docusate sodium (COLACE) 100 MG capsule Take 100 mg by mouth daily as needed for mild constipation.   Yes [provider]  donepezil (ARICEPT) 10 MG tablet TAKE 1 TABLET ONCE DAILY. 01/17/17  Yes Nafziger, Tommi Rumps, NP  fluticasone (FLONASE) 50 MCG/ACT nasal spray Place 2 sprays into both nostrils daily.   Yes [provider]  furosemide (LASIX) 20 MG tablet Take 1.5 tablets (30 mg total) by mouth daily. Take an extra 0.5 tablet daily as needed. 03/10/17  Yes Skeet Latch, MD  glimepiride (AMARYL) 2 MG tablet TAKE 1 TABLET ONCE DAILY. Patient taking differently: TAKE 0.5 TABLET ONCE DAILY 04/19/17  Yes Nafziger, Tommi Rumps, NP  HYDROcodone-acetaminophen (NORCO) 5-325 MG tablet Take one half to one pill every 6- 8 hours as needed for pain Patient taking differently: Take 0.5 tablets by mouth every 6 (six) hours as needed for moderate pain. Take one half to one pill every 6- 8 hours as needed for pain 07/26/17  Yes Nafziger,  Tommi Rumps, NP  Inulin (FIBER CHOICE PO) Take 1 capsule by mouth daily.   Yes [provider]  Iron-FA-B Cmp-C-Biot-Probiotic (FUSION PLUS) CAPS Take 1 tablet by mouth daily. Patient taking differently: Take 1 tablet once a week by mouth.  01/13/17  Yes Nafziger, Tommi Rumps, NP  loratadine (ALAVERT) 10 MG tablet Take 5 mg daily by mouth.    Yes [provider]  memantine (NAMENDA) 5 MG tablet Take 1 tablet (5 mg total) by mouth at bedtime. 05/26/17  Yes Nafziger, Tommi Rumps, NP  metoprolol tartrate (LOPRESSOR) 25 MG tablet Take 1 tablet (25 mg total) by mouth 2 (two) times daily. 07/16/17  Yes Hosie Poisson, MD  mirabegron ER (MYRBETRIQ) 25 MG TB24 tablet Take 25 mg by mouth daily.   Yes [provider]  omeprazole (PRILOSEC) 40 MG capsule Take 30- 60 min before your first and last meals of the day Patient taking differently: Take 40 mg daily by mouth. Take 30-  60 min before your first and last meals of the day 01/05/17  Yes Tanda Rockers, MD  Polyethyl Glycol-Propyl Glycol (SYSTANE) 0.4-0.3 % SOLN Apply 1 drop to eye 2 (two) times daily as needed (dry eyes).   Yes [provider]  polyethylene glycol (MIRALAX / GLYCOLAX) packet Take 17 g by mouth daily as needed for mild constipation, moderate constipation or severe constipation.   Yes [provider]  potassium chloride SA (K-DUR,KLOR-CON) 20 MEQ tablet TAKE 1 TABLET ONCE DAILY. 07/20/17  Yes Martinique, Peter M, MD  vitamin B-12 (CYANOCOBALAMIN) 500 MCG tablet Take 500 mcg by mouth daily.   Yes [provider]  amoxicillin-clavulanate (AUGMENTIN) 875-125 MG tablet Take 1 tablet by mouth every 12 (twelve) hours. Patient not taking: Reported on 07/18/2017 07/29/17 09/27/17  Hosie Poisson, MD    Family History Family History  Problem Relation Age of Onset  . Heart disease Father   . Diabetes Mother   . Thyroid disease Neg Hx     Social History Social History   Tobacco Use  . Smoking status: Never Smoker  .  Smokeless tobacco: Never Used  Substance Use Topics  . Alcohol use: No    Alcohol/week: 0.0 oz  . Drug use: No     Allergies   Pollen extract   Review of Systems Review of Systems  Unable to perform ROS: Dementia     Physical Exam Updated Vital Signs BP (!) 144/89   Pulse 89   Temp (!) 97.5 F (36.4 C) (Oral)   Resp 18   SpO2 96%   Physical Exam  Constitutional: She appears well-developed and well-nourished.  HENT:  Head: Normocephalic and atraumatic.  Mouth/Throat: Oropharynx is clear and moist.  Eyes: EOM are normal. Pupils are equal, round, and reactive to light.  Neck: Normal range of motion. Neck supple.  Cardiovascular: Normal rate and regular rhythm. Exam reveals no gallop and no friction rub.  No murmur heard. Pulmonary/Chest: Effort normal and breath sounds normal. No stridor. No respiratory distress. She has no wheezes. She has no rales. She exhibits no tenderness.  Abdominal: Soft. Bowel sounds are normal. There is no tenderness. There is no rebound and no guarding.  Musculoskeletal: Normal range of motion. She exhibits no edema or tenderness.  Patient with PICC line in the right antecubital fossa.  There is a gauze surrounding insertion site is soaked with blood.  Does not appear to have any active bleeding.  PICC line appears to be in place.  Distal pulses are 2+.  Neurological: She is alert.  Moves all extremities without deficit.  Sensation is grossly intact.  Skin: Skin is warm and dry. Capillary refill takes less than 2 seconds. No rash noted. No erythema.  Psychiatric: She has a normal mood and affect. Her behavior is normal.  Nursing note and vitals reviewed.    ED Treatments / Results  Labs (all labs ordered are listed, but only abnormal results are displayed) Labs Reviewed  COMPREHENSIVE METABOLIC PANEL - Abnormal; Notable for the following components:      Result Value   Potassium 3.4 (*)    Glucose, Bld 163 (*)    Creatinine, Ser 1.07 (*)     Albumin 3.1 (*)    GFR calc non Af Amer 46 (*)    GFR calc Af Amer 53 (*)    All other components within normal limits  BRAIN NATRIURETIC PEPTIDE - Abnormal; Notable for the following components:   B Natriuretic Peptide 246.8 (*)  All other components within normal limits  CBC WITH DIFFERENTIAL/PLATELET    EKG  EKG Interpretation None       Radiology Dg Chest 2 View  Result Date: 07/27/2017 CLINICAL DATA:  Bleeding around a PICC insertion site. EXAM: CHEST  2 VIEW COMPARISON:  Fluoro spot radiograph of July 21, 2017 and chest x-ray of July 16, 2017. FINDINGS: The PICC line tip projects over the midportion of the SVC. The lungs are well-expanded. The interstitial markings are increased. The cardiac silhouette is enlarged. The pulmonary vascularity is mildly prominent. There is calcification in the wall of the aortic arch. IMPRESSION: The appearance of the PICC line is unremarkable with the tip projecting over the midportion of the SVC. As best as can be determined and has not been markedly retracted since the fluoro spot film on the date of placement. Chronic bronchitic change. Mildly increased airspace opacity in the right upper lung may reflect developing pneumonia or atelectasis. Followup PA and lateral chest X-ray is recommended in 3-4 weeks following trial of antibiotic therapy to ensure resolution and exclude underlying malignancy. Thoracic aortic atherosclerosis. Electronically Signed   By: Naidelin Gugliotta  [place] M.D.   On: 07/27/2017 09:26    Procedures Procedures (including critical care time)  Medications Ordered in ED Medications  HYDROcodone-acetaminophen (NORCO/VICODIN) 5-325 MG per tablet 1 tablet (0.5 tablets Oral Given 07/27/17 0859)  furosemide (LASIX) injection 40 mg (40 mg Intravenous Given 07/27/17 1223)     Initial Impression / Assessment and Plan / ED Course  I have reviewed the triage vital signs and the nursing notes.  Pertinent labs & imaging results  that were available during my care of the patient were reviewed by me and considered in my medical decision making (see chart for details).     PICC line appears to be appropriately placed.  Dressing was replaced.  No active bleeding. Patient has infiltrate on the x-ray.  Normal white blood cell count.  Mild elevation in BNP.  Suspect this may be more asymmetric edema than pneumonia.  Given dose of IV Lasix.  Maintaining adequate oxygen saturations on room air. Final Clinical Impressions(s) / ED Diagnoses   Final diagnoses:  Bleeding from PICC line, initial encounter [facility])  Acute pulmonary edema [facility])    ED Discharge Orders    None       Julianne Rice, MD 07/27/17 1302

## 2017-07-27 NOTE — Telephone Encounter (Signed)
Spoke to patients daughter [name]). [name] reports that Mallory is suffering from pain in her buttocks from wound that she sustained. She is currently prescribed Tramadol by this Probation officer. [name] does not believe that Tramadol is controlling pain adequately for the patient.   I informed [name] that I would be willing to write a one time dose of Norco 5/325 mg for [employer]. She is to take 1/2 to 1 pill every 6-8 hours PRN. She should follow up with Surgery for further management of her pain.   She is agreeable to this plan

## 2017-07-27 NOTE — Telephone Encounter (Signed)
Anna [name], please document that you sent in pain medication and close note.  Thanks!!

## 2017-07-27 NOTE — Care Management Note (Signed)
Case Management Note  Patient Details  Name: Anna [name] MRN: [id number] Date of Birth: 1929/02/07  CM noted pt was active with [facility].  Contacted Santiago Glad who confirmed HHS of RN PT OT NA SW ST  Expected Discharge Date:   Unknown               Expected Discharge Plan:  [place]  Post Acute Care Choice:  Home Health Choice offered to:  Patient  HH Arranged:  RN, PT, Speech Therapy, OT, Social Work, Nurse's Aide [place] Agency:  [place]  Status of Service:  In process, will continue to follow  Rae Mar, RN 07/27/2017, 10:58 AM

## 2017-07-27 NOTE — Progress Notes (Signed)
[facility]  Active pt with [facility] with HH services and [facility] services for home IV ABX.  [facility] team will follow pt while here to support transition home.  If patient discharges after hours, please call [phone].   Larry Sierras 07/27/2017, 10:17 AM

## 2017-07-27 NOTE — ED Notes (Signed)
Bed: MQ59 Expected date:  Expected time:  Means of arrival:  Comments: EMS-PICC line issue

## 2017-07-29 DIAGNOSIS — E1122 Type 2 diabetes mellitus with diabetic chronic kidney disease: Secondary | ICD-10-CM | POA: Diagnosis not present

## 2017-07-29 DIAGNOSIS — M4628 Osteomyelitis of vertebra, sacral and sacrococcygeal region: Secondary | ICD-10-CM | POA: Diagnosis not present

## 2017-07-29 DIAGNOSIS — I509 Heart failure, unspecified: Secondary | ICD-10-CM | POA: Diagnosis not present

## 2017-07-29 DIAGNOSIS — L0231 Cutaneous abscess of buttock: Secondary | ICD-10-CM | POA: Diagnosis not present

## 2017-07-29 DIAGNOSIS — E1169 Type 2 diabetes mellitus with other specified complication: Secondary | ICD-10-CM | POA: Diagnosis not present

## 2017-07-29 DIAGNOSIS — N183 Chronic kidney disease, stage 3 (moderate): Secondary | ICD-10-CM | POA: Diagnosis not present

## 2017-07-29 DIAGNOSIS — F0280 Dementia in other diseases classified elsewhere without behavioral disturbance: Secondary | ICD-10-CM | POA: Diagnosis not present

## 2017-07-29 DIAGNOSIS — I130 Hypertensive heart and chronic kidney disease with heart failure and stage 1 through stage 4 chronic kidney disease, or unspecified chronic kidney disease: Secondary | ICD-10-CM | POA: Diagnosis not present

## 2017-07-29 DIAGNOSIS — G309 Alzheimer's disease, unspecified: Secondary | ICD-10-CM | POA: Diagnosis not present

## 2017-07-29 DIAGNOSIS — L0501 Pilonidal cyst with abscess: Secondary | ICD-10-CM | POA: Diagnosis not present

## 2017-07-29 LAB — CBC AND DIFFERENTIAL
HEMATOCRIT: 41 (ref 36–46)
HEMOGLOBIN: 13.3 (ref 12.0–16.0)
NEUTROS ABS: 5
PLATELETS: 215 (ref 150–399)
WBC: 6.2

## 2017-07-29 LAB — BASIC METABOLIC PANEL
BUN: 17 (ref 4–21)
Creatinine: 1.1 (ref 0.5–1.1)
Glucose: 142
Potassium: 4.5 (ref 3.4–5.3)
SODIUM: 142 (ref 137–147)

## 2017-07-31 ENCOUNTER — Telehealth: Payer: Self-pay | Admitting: Adult Health

## 2017-07-31 NOTE — Telephone Encounter (Signed)
Copied from [place] [id number]. Topic: Quick Communication - Rx Refill/Question >> Jul 31, 2017  1:17 PM Robina Ade, Helene Kelp D wrote: Has the patient contacted their pharmacy? Yes (Agent: If no, request that the patient contact the pharmacy for the refill.) Preferred Pharmacy (with phone number or street name): [place], [place]. Agent: Please be advised that RX refills may take up to 3 business days. We ask that you follow-up with your pharmacy. Patient needs refill on her HYDROcodone-acetaminophen ([place]) 5-325 MG tablet sent to her pharmacy. Per daughter prefer to give patient 1 whole pill for pain.

## 2017-08-01 ENCOUNTER — Ambulatory Visit: Payer: Self-pay | Admitting: *Deleted

## 2017-08-01 ENCOUNTER — Other Ambulatory Visit: Payer: Self-pay | Admitting: Internal Medicine

## 2017-08-01 ENCOUNTER — Ambulatory Visit: Payer: Medicare HMO | Admitting: Cardiovascular Disease

## 2017-08-01 MED ORDER — HYDROCODONE-ACETAMINOPHEN 5-325 MG PO TABS: 0.5000 | ORAL_TABLET | Freq: Four times a day (QID) | ORAL | 0 refills | Status: DC | PRN

## 2017-08-01 NOTE — Telephone Encounter (Signed)
Noted  

## 2017-08-01 NOTE — Telephone Encounter (Signed)
Pt's daughter [name] called stating that her mom is in pain and that "Tramadol does not work and her blood pressure goes up without the medication:; daughter states that her mom can not lie in bed comfortably without the hydrocodone-acetaminophen 5-325 (see CRM [phone] and telephone ecounters dated 07/31/17 and 08/01/17);pt had surgical procedure 07/11/17 and daugthter that states that pt will have long standing pain because she is not as mobile as a young person; per MD's note on 08/01/17 @ 1029 pt needs to be evaluated by pain management;Nurse triage initiated; per protocol recommendation pt's daughter is directed to take her mother to now but she refuses; pt does have an appointment with Beaulah Dinning on 08/02/17 at 89; Spoke Dr Larose Kells and he sent refill for 4 tablets to [facility] for pain coverage until MD office appointment; also confirmed appointment with Beaulah Dinning on 08/02/17 at 1100; she verbalizes understanding.   Reason for Disposition . [1] SEVERE back pain (e.g., excruciating) AND [2] sudden onset AND [3] age > 28  Answer Assessment - Initial Assessment Questions 1. ONSET: "When did the pain begin?"      After surgery on 07/11/17 for coccyx abscess 2. LOCATION: "Where does it hurt?" (upper, mid or lower back) coccyx 3. SEVERITY: "How bad is the pain?"  (e.g., Scale 1-10; mild, moderate, or severe)   - MILD (1-3): doesn't interfere with normal activities    - MODERATE (4-7): interferes with normal activities or awakens from sleep    - SEVERE (8-10): excruciating pain, unable to do any normal activities      severe 4. PATTERN: "Is the pain constant?" (e.g., yes, no; constant, intermittent)      constant 5. RADIATION: "Does the pain shoot into your legs or elsewhere?"     no 6. CAUSE:  "What do you think is causing the back pain?"      Surgery 11/201/8 7. BACK OVERUSE:  "Any recent lifting of heavy objects, strenuous work or exercise?"     no 8. MEDICATIONS: "What have you  taken so far for the pain?" (e.g., nothing, acetaminophen, NSAIDS)     Tramadol doesn't work and hydrocodone acetaminophen 5-325 9. NEUROLOGIC SYMPTOMS: "Do you have any weakness, numbness, or problems with bowel/bladder control?"     weakness 10. OTHER SYMPTOMS: "Do you have any other symptoms?" (e.g., fever, abdominal pain, burning with urination, blood in urine)       no 11. PREGNANCY: "Is there any chance you are pregnant?" (e.g., yes, no; LMP)       no  Protocols used: BACK PAIN-A-AH

## 2017-08-01 NOTE — Telephone Encounter (Signed)
Patient's daughter is calling to let office know that her mother is needing pain medication- she is using a whole pill for pain due to increased- post surgical pain with infection in bone. Daughter is very upset and needs this medication.It is the only thing that is keeping her BP down. Can something be sent in?  Malone Pontiac

## 2017-08-01 NOTE — Telephone Encounter (Signed)
Advised patient's daughter that per last Rx- she is to contact surgeon for pain management for wound.

## 2017-08-02 ENCOUNTER — Other Ambulatory Visit: Payer: Self-pay | Admitting: Adult Health

## 2017-08-02 ENCOUNTER — Telehealth: Payer: Self-pay

## 2017-08-02 ENCOUNTER — Inpatient Hospital Stay: Payer: Medicare HMO | Admitting: Adult Health

## 2017-08-02 DIAGNOSIS — E1169 Type 2 diabetes mellitus with other specified complication: Secondary | ICD-10-CM | POA: Diagnosis not present

## 2017-08-02 DIAGNOSIS — L0501 Pilonidal cyst with abscess: Secondary | ICD-10-CM | POA: Diagnosis not present

## 2017-08-02 DIAGNOSIS — G309 Alzheimer's disease, unspecified: Secondary | ICD-10-CM | POA: Diagnosis not present

## 2017-08-02 DIAGNOSIS — N183 Chronic kidney disease, stage 3 (moderate): Secondary | ICD-10-CM | POA: Diagnosis not present

## 2017-08-02 DIAGNOSIS — I130 Hypertensive heart and chronic kidney disease with heart failure and stage 1 through stage 4 chronic kidney disease, or unspecified chronic kidney disease: Secondary | ICD-10-CM | POA: Diagnosis not present

## 2017-08-02 DIAGNOSIS — M4628 Osteomyelitis of vertebra, sacral and sacrococcygeal region: Secondary | ICD-10-CM | POA: Diagnosis not present

## 2017-08-02 DIAGNOSIS — E1122 Type 2 diabetes mellitus with diabetic chronic kidney disease: Secondary | ICD-10-CM | POA: Diagnosis not present

## 2017-08-02 DIAGNOSIS — I509 Heart failure, unspecified: Secondary | ICD-10-CM | POA: Diagnosis not present

## 2017-08-02 DIAGNOSIS — F0280 Dementia in other diseases classified elsewhere without behavioral disturbance: Secondary | ICD-10-CM | POA: Diagnosis not present

## 2017-08-02 MED ORDER — HYDROCODONE-ACETAMINOPHEN 5-325 MG PO TABS: 0.5000 | ORAL_TABLET | Freq: Four times a day (QID) | ORAL | 0 refills | Status: AC | PRN

## 2017-08-02 NOTE — Telephone Encounter (Signed)
I am ok with with doing another 3 days of narcotic pain medication. As we discussed last week, I cannot write this medication long term. She is seeing surgery soon and they can take over this medication if they feel as though it is appropriate

## 2017-08-02 NOTE — Telephone Encounter (Signed)
[name] notified to pick up at the front desk.

## 2017-08-02 NOTE — Telephone Encounter (Signed)
I will prescribe her one additional week of pain medication.

## 2017-08-02 NOTE — Telephone Encounter (Signed)
Spoke to [place] and informed her that Anna [name] has sent in another 3 days of medication.  While on the phone Anna [name] informed me that she had been in contact with Anna [name] (triage nurse) at [facility] and was informed that Anna [name] will not be prescribing long term pain medication for wound of coccyx.  Surgical center has asked that Anna [name] take over prescribing pain medications.  Anna [name] (home health nurse) was there while talking to [place].  She reported that pt's wound is the size of a quarter and 3cc deep.  Wound is having to be packed and pt in quite a bit of pain.  Pt also taking oral amoxicillin for 4 wks.  Discussed with Anna [name].  He advised palliative care for the pt.  They will be able to take over pain medications.  Pt will not have to come in to the office for follow up of pain medications and drug testing.  I will call Anna [name] back and discuss with her.

## 2017-08-02 NOTE — Telephone Encounter (Signed)
Called and spoke to Anna [name].  She is not willing for palliative care to come in.  Advised that if [facility] manages pain medication than pt will have to come in every 3 months and be drug tested.  Also informed her that no medication will be given if there is a missed appointment for ANY reason.  [name] did states that she understands and agrees.  However, she informed me that she only wants her mother on these pain medications for a few weeks while her wound is healing.  Informed her that I will let Tommi Rumps know that I have spoken to her and will call back.

## 2017-08-02 NOTE — Telephone Encounter (Signed)
Copied from [place]. Topic: General - Other >> Aug 02, 2017  7:16 AM Lennox Solders wrote: Reason for CRM: Pt daughter terri is requesting to talk  with cory or his nurse today. [name] does not want to bring her mother in office today she did not cancel the appt. [name] will wait to after she talks to cory concerning this appt. [name] is requesting more hydrocodone for her mom

## 2017-08-03 ENCOUNTER — Telehealth: Payer: Self-pay | Admitting: Family Medicine

## 2017-08-03 DIAGNOSIS — N183 Chronic kidney disease, stage 3 (moderate): Secondary | ICD-10-CM | POA: Diagnosis not present

## 2017-08-03 DIAGNOSIS — M4628 Osteomyelitis of vertebra, sacral and sacrococcygeal region: Secondary | ICD-10-CM | POA: Diagnosis not present

## 2017-08-03 DIAGNOSIS — G309 Alzheimer's disease, unspecified: Secondary | ICD-10-CM | POA: Diagnosis not present

## 2017-08-03 DIAGNOSIS — I509 Heart failure, unspecified: Secondary | ICD-10-CM | POA: Diagnosis not present

## 2017-08-03 DIAGNOSIS — E1169 Type 2 diabetes mellitus with other specified complication: Secondary | ICD-10-CM | POA: Diagnosis not present

## 2017-08-03 DIAGNOSIS — I130 Hypertensive heart and chronic kidney disease with heart failure and stage 1 through stage 4 chronic kidney disease, or unspecified chronic kidney disease: Secondary | ICD-10-CM | POA: Diagnosis not present

## 2017-08-03 DIAGNOSIS — F0280 Dementia in other diseases classified elsewhere without behavioral disturbance: Secondary | ICD-10-CM | POA: Diagnosis not present

## 2017-08-03 DIAGNOSIS — E1122 Type 2 diabetes mellitus with diabetic chronic kidney disease: Secondary | ICD-10-CM | POA: Diagnosis not present

## 2017-08-03 DIAGNOSIS — L0501 Pilonidal cyst with abscess: Secondary | ICD-10-CM | POA: Diagnosis not present

## 2017-08-03 NOTE — Telephone Encounter (Signed)
Ok for verbal orders ?

## 2017-08-03 NOTE — Telephone Encounter (Signed)
Copied from [place]. Topic: General - Other >> Aug 03, 2017  9:10 AM Anna [name] wrote: Reason for CRM: Requesting verbal orders for PT, 1x a week for 4 weeks

## 2017-08-03 NOTE — Telephone Encounter (Signed)
Spoke to [place] and informed her to proceed with orders per [facility].  No further action required.  Will close note.

## 2017-08-04 ENCOUNTER — Telehealth: Payer: Self-pay | Admitting: Adult Health

## 2017-08-04 DIAGNOSIS — E1122 Type 2 diabetes mellitus with diabetic chronic kidney disease: Secondary | ICD-10-CM | POA: Diagnosis not present

## 2017-08-04 DIAGNOSIS — M4628 Osteomyelitis of vertebra, sacral and sacrococcygeal region: Secondary | ICD-10-CM | POA: Diagnosis not present

## 2017-08-04 DIAGNOSIS — G309 Alzheimer's disease, unspecified: Secondary | ICD-10-CM | POA: Diagnosis not present

## 2017-08-04 DIAGNOSIS — N183 Chronic kidney disease, stage 3 (moderate): Secondary | ICD-10-CM | POA: Diagnosis not present

## 2017-08-04 DIAGNOSIS — E1169 Type 2 diabetes mellitus with other specified complication: Secondary | ICD-10-CM | POA: Diagnosis not present

## 2017-08-04 DIAGNOSIS — I130 Hypertensive heart and chronic kidney disease with heart failure and stage 1 through stage 4 chronic kidney disease, or unspecified chronic kidney disease: Secondary | ICD-10-CM | POA: Diagnosis not present

## 2017-08-04 DIAGNOSIS — I509 Heart failure, unspecified: Secondary | ICD-10-CM | POA: Diagnosis not present

## 2017-08-04 DIAGNOSIS — F0280 Dementia in other diseases classified elsewhere without behavioral disturbance: Secondary | ICD-10-CM | POA: Diagnosis not present

## 2017-08-04 DIAGNOSIS — L0501 Pilonidal cyst with abscess: Secondary | ICD-10-CM | POA: Diagnosis not present

## 2017-08-04 NOTE — Telephone Encounter (Signed)
I spoke with Jeani Hawking and explained that this order would need to come from infectious disease, he said that he does not have one listed, The orders originally came from hospital during patient stay. Do you know who they should contact?

## 2017-08-04 NOTE — Telephone Encounter (Signed)
Anna [name] from [facility] stated that pt was getting IV antibiotics and has completed therapy. Per Anna [name] pt has transitioned to by mouth antibiotics. Anna [name] is requesting an order that they may dc the [facility]. Please call Anna [name] at [phone].

## 2017-08-04 NOTE — Telephone Encounter (Signed)
Called and spoke to [place].  Advised that the order to d/c should come from infectious disease.  Jeani Hawking to call family to find out more information.

## 2017-08-07 ENCOUNTER — Ambulatory Visit: Payer: Medicare HMO | Admitting: Infectious Diseases

## 2017-08-07 DIAGNOSIS — M4628 Osteomyelitis of vertebra, sacral and sacrococcygeal region: Secondary | ICD-10-CM

## 2017-08-07 DIAGNOSIS — F0281 Dementia in other diseases classified elsewhere with behavioral disturbance: Secondary | ICD-10-CM

## 2017-08-07 DIAGNOSIS — G309 Alzheimer's disease, unspecified: Secondary | ICD-10-CM | POA: Diagnosis not present

## 2017-08-07 DIAGNOSIS — A1801 Tuberculosis of spine: Secondary | ICD-10-CM | POA: Diagnosis not present

## 2017-08-07 MED ORDER — FLUCONAZOLE 100 MG PO TABS: 100.0000 mg | ORAL_TABLET | Freq: Every day | ORAL | 3 refills | Status: DC

## 2017-08-07 NOTE — Assessment & Plan Note (Signed)
I spoke with daughter- she has advance directives, POA. She says that she will care for her at home and she does not want a SNF. Daughter is formerly Conservation officer, historic buildings.

## 2017-08-07 NOTE — Assessment & Plan Note (Signed)
She will continue on airflow bed She will continue dietary protein supplement.  Will continue amoxil til she returns to clinic in 6 weeks PIC being removed today.  She will f/u with surgery and wound care.  Daughter would like flucon prn for yeast infections.

## 2017-08-07 NOTE — Progress Notes (Signed)
   Subjective:    Patient ID: Anna [name], female    DOB: [birth year], 83 y.o.   MRN: [id number]  HPI 83 yo F with hx of alzheimer's, who was hospitalized 11-18 to 07-17-17 with coccygeal abscess. This grew E faecalis in a wound Cx and Strep on a bone bx. She was treated with 2 weeks of unasyn to be followed by 2 months of augmentin.  She has home nursing and was told that her wound looks well. Has needed less packing than previous.  She has f/u with surgery next week.  Daughter has noted no d/c or odor in the wound.  Has air flow bed at home.  Still has PIC, finished IV > 1 week ago. Taking po amoxil.   Review of Systems  Constitutional: Negative for appetite change, chills and fever.  Gastrointestinal: Negative for constipation and diarrhea.  Genitourinary: Negative for difficulty urinating.  having loose stool (not diarrhea) all day long after no BM for 12 days. Has taken prn imodium. Only had 1 smear today. She is also taking probiotic daily.  Has PT/OT at home.  Please see HPI. All other systems reviewed and negative.      Objective:   Physical Exam  Constitutional: No distress.  Eyes: EOM are normal. Pupils are equal, round, and reactive to light.  Cardiovascular: Normal rate, regular rhythm and normal heart sounds.  Pulmonary/Chest: Effort normal and breath sounds normal.  Abdominal: Soft. Bowel sounds are normal. There is no tenderness.  Musculoskeletal: She exhibits no edema.       Arms: Neurological: She is alert.  Psychiatric:  She is awake but does not vocalize. Her daughter attributes this to her recent oxycodone (5mg ).            Assessment & Plan:

## 2017-08-08 DIAGNOSIS — E1169 Type 2 diabetes mellitus with other specified complication: Secondary | ICD-10-CM | POA: Diagnosis not present

## 2017-08-08 DIAGNOSIS — N183 Chronic kidney disease, stage 3 (moderate): Secondary | ICD-10-CM | POA: Diagnosis not present

## 2017-08-08 DIAGNOSIS — M4628 Osteomyelitis of vertebra, sacral and sacrococcygeal region: Secondary | ICD-10-CM | POA: Diagnosis not present

## 2017-08-08 DIAGNOSIS — I509 Heart failure, unspecified: Secondary | ICD-10-CM | POA: Diagnosis not present

## 2017-08-08 DIAGNOSIS — L0501 Pilonidal cyst with abscess: Secondary | ICD-10-CM | POA: Diagnosis not present

## 2017-08-08 DIAGNOSIS — I130 Hypertensive heart and chronic kidney disease with heart failure and stage 1 through stage 4 chronic kidney disease, or unspecified chronic kidney disease: Secondary | ICD-10-CM | POA: Diagnosis not present

## 2017-08-08 DIAGNOSIS — G309 Alzheimer's disease, unspecified: Secondary | ICD-10-CM | POA: Diagnosis not present

## 2017-08-08 DIAGNOSIS — E1122 Type 2 diabetes mellitus with diabetic chronic kidney disease: Secondary | ICD-10-CM | POA: Diagnosis not present

## 2017-08-08 DIAGNOSIS — F0280 Dementia in other diseases classified elsewhere without behavioral disturbance: Secondary | ICD-10-CM | POA: Diagnosis not present

## 2017-08-08 NOTE — Progress Notes (Signed)
Per Dr Johnnye Sima PICC removed.  Per Dr Johnnye Sima  36 cm  Single lumen Peripherally Inserted Central Catheter  removed from right basilic . No sutures present. Dressing was clean and dry . Area cleansed with chlorhexidine and petroleum dressing applied. Pt advised no heavy lifting with this arm, leave dressing for 24 hours and call the office if dressing becomes soaked with blood or sharp pain presents.  Pt tolerated procedure well.    Laverle Patter, RN    Removed on 08-07-17

## 2017-08-09 ENCOUNTER — Encounter: Payer: Self-pay | Admitting: Adult Health

## 2017-08-09 ENCOUNTER — Ambulatory Visit: Payer: Medicare HMO | Admitting: Adult Health

## 2017-08-09 VITALS — HR 82 | Temp 97.4°F

## 2017-08-09 DIAGNOSIS — A1801 Tuberculosis of spine: Secondary | ICD-10-CM | POA: Diagnosis not present

## 2017-08-09 DIAGNOSIS — M4628 Osteomyelitis of vertebra, sacral and sacrococcygeal region: Secondary | ICD-10-CM

## 2017-08-09 LAB — CBC WITH DIFFERENTIAL/PLATELET
BASOS PCT: 0.6 % (ref 0.0–3.0)
Basophils Absolute: 0 10*3/uL (ref 0.0–0.1)
Eosinophils Absolute: 0.2 10*3/uL (ref 0.0–0.7)
Eosinophils Relative: 2.4 % (ref 0.0–5.0)
HEMATOCRIT: 46.1 % — AB (ref 36.0–46.0)
Hemoglobin: 14.9 g/dL (ref 12.0–15.0)
LYMPHS PCT: 18.4 % (ref 12.0–46.0)
Lymphs Abs: 1.3 10*3/uL (ref 0.7–4.0)
MCHC: 32.4 g/dL (ref 30.0–36.0)
MCV: 95.4 fl (ref 78.0–100.0)
MONOS PCT: 7.6 % (ref 3.0–12.0)
Monocytes Absolute: 0.5 10*3/uL (ref 0.1–1.0)
NEUTROS ABS: 5 10*3/uL (ref 1.4–7.7)
Neutrophils Relative %: 71 % (ref 43.0–77.0)
PLATELETS: 237 10*3/uL (ref 150.0–400.0)
RBC: 4.84 Mil/uL (ref 3.87–5.11)
RDW: 15 % (ref 11.5–15.5)
WBC: 7 10*3/uL (ref 4.0–10.5)

## 2017-08-09 NOTE — Progress Notes (Signed)
Subjective:    Patient ID: Anna [name], female    DOB: 10/05/1928, 83 y.o.   MRN: [id number]  HPI 83 year old female who  has a past medical history of Alzheimer's disease, Atrial fibrillation ([place]), Bronchitis, CHF (congestive heart failure) ([place]), Chronic renal insufficiency, stage III (moderate) ([place]), Diabetes mellitus without complication ([place]), Dyspnea, GERD (gastroesophageal reflux disease), GI bleeding (11/2013), Hyperlipidemia, Hypertension, Hyperthyroidism, LBBB (left bundle branch block), Overactive bladder, Overweight, Persistent atrial fibrillation ([place]), Pneumonia, Thyroid mass, UTI (urinary tract infection), Vitamin B12 deficiency, and Vitamin D deficiency.  She presents to the office today for follow up regarding recent ER visit.  She is with her daughter and home health aide.   Into the emergency room by her daughter when her daughter was concerned for blood surrounding the insertion site of the PICC line.  The patient called the home health nurse who recommended that she go to the emergency room for evaluation.  Discharged the PICC line appeared to be working properly and was placed appropriately.  Dressing was replaced and there was no active bleeding.  She did have a chest x-ray in the emergency room and she had an infiltrate noted.  This was thought to be more of an asymptomatic edema rather than pneumonia she was given an IV dose of Lasix.  Emergency room visit, but he has completed her IV course of Unasyn and the PICC line was discontinued by home health.  She is followed up with Dr. Bobby Rumpf of infectious disease for coccygeal abscess.  He is currently taking Augmentin 875 twice daily times 6 weeks and has a surgery follow-up next week as well as wound care  I prescribed  a short course of Norco for severe pain.  Her daughter reports that Anna [name] is responding well to this pain medication and is only having to take half a tab as needed every 6-8 hours.  Also reports  that the wound is healing well.  The drainage, odor, or signs of infection.  Recent lab showed no white count and her daughter would like this lab repeated today while in the office   Review of Systems  Unable to perform ROS: Dementia   Past Medical History:  Diagnosis Date  . Alzheimer's disease   . Atrial fibrillation ([place])   . Bronchitis   . CHF (congestive heart failure) ([place])   . Chronic renal insufficiency, stage III (moderate) ([facility])   . Diabetes mellitus without complication ([place])   . Dyspnea    with exertion  . GERD (gastroesophageal reflux disease)   . GI bleeding 11/2013   due to supratherapeutic INR  . Hyperlipidemia   . Hypertension   . Hyperthyroidism    on amiodarone  . LBBB (left bundle branch block)   . Overactive bladder   . Overweight   . Persistent atrial fibrillation ([place])   . Pneumonia   . Thyroid mass    LLL  . UTI (urinary tract infection)   . Vitamin B12 deficiency   . Vitamin D deficiency     Social History   Socioeconomic History  . Marital status: Divorced    Spouse name: Not on file  . Number of children: Not on file  . Years of education: Not on file  . Highest education level: Not on file  Social Needs  . Financial resource strain: Not very hard  . Food insecurity - worry: Never true  . Food insecurity - inability: Never true  . Transportation needs - medical:  No  . Transportation needs - non-medical: No  Occupational History  . Not on file  Tobacco Use  . Smoking status: Never Smoker  . Smokeless tobacco: Never Used  Substance and Sexual Activity  . Alcohol use: No    Alcohol/week: 0.0 oz  . Drug use: No  . Sexual activity: Not on file  Other Topics Concern  . Not on file  Social History Narrative   Pt recently moved to [place] with daughter from [place]    Past Surgical History:  Procedure Laterality Date  . ABDOMINAL HYSTERECTOMY    . ESOPHAGOGASTRODUODENOSCOPY (EGD) WITH PROPOFOL N/A 02/16/2017   Procedure:  ESOPHAGOGASTRODUODENOSCOPY (EGD) WITH PROPOFOL;  Surgeon: Irene Shipper, MD;  Location: [facility] ENDOSCOPY;  Service: Endoscopy;  Laterality: N/A;  . IR FLUORO GUIDE CV MIDLINE PICC RIGHT  07/21/2017  . IR US GUIDE VASC ACCESS RIGHT  07/21/2017  . IRRIGATION AND DEBRIDEMENT ABSCESS N/A 07/11/2017   Procedure: IRRIGATION AND DEBRIDEMENT OF SACRAL ABSCESS;  Surgeon: Ralene Ok, MD;  Location: [facility] ORS;  Service: General;  Laterality: N/A;  SACRUM    Family History  Problem Relation Age of Onset  . Heart disease Father   . Diabetes Mother   . Thyroid disease Neg Hx     Allergies  Allergen Reactions  . Pollen Extract     Sneezing, watery eyes    Current Outpatient Medications on File Prior to Visit  Medication Sig Dispense Refill  . ACCU-CHEK AVIVA PLUS test strip PATIENT TO CHECK SUGARS ONCE DAILY.  3  . acetaminophen (TYLENOL) 500 MG tablet Take 500 mg by mouth every 6 (six) hours as needed for fever.     Marland Kitchen amoxicillin-clavulanate (AUGMENTIN) 875-125 MG tablet Take 1 tablet by mouth every 12 (twelve) hours. 60 tablet 1  . aspirin 325 MG tablet Take 325 mg by mouth daily.    . calcitRIOL (ROCALTROL) 0.5 MCG capsule Take 1 capsule (0.5 mcg total) by mouth 3 (three) times a week. Monday, Wednesday, and Friday only 36 capsule 3  . carbamide peroxide (DEBROX) 6.5 % OTIC solution Place 5 drops into both ears 2 (two) times daily. (Patient taking differently: Place 5 drops 2 (two) times daily as needed into both ears (irritation). ) 15 mL 1  . cholecalciferol (VITAMIN D) 1000 units tablet Take 1,000 Units by mouth daily.    Marland Kitchen docusate sodium (COLACE) 100 MG capsule Take 100 mg by mouth daily as needed for mild constipation.    Marland Kitchen donepezil (ARICEPT) 10 MG tablet TAKE 1 TABLET ONCE DAILY. 90 tablet 3  . fluconazole (DIFLUCAN) 100 MG tablet Take 1 tablet (100 mg total) by mouth daily. 10 tablet 3  . fluticasone (FLONASE) 50 MCG/ACT nasal spray Place 2 sprays into both nostrils daily.    .  furosemide (LASIX) 20 MG tablet Take 1.5 tablets (30 mg total) by mouth daily. Take an extra 0.5 tablet daily as needed. 60 tablet 5  . glimepiride (AMARYL) 2 MG tablet TAKE 1 TABLET ONCE DAILY. (Patient taking differently: TAKE 0.5 TABLET ONCE DAILY) 90 tablet 3  . HYDROcodone-acetaminophen (NORCO) 5-325 MG tablet Take 0.5 tablets by mouth every 6 (six) hours as needed for up to 7 days for moderate pain. Take one half to one pill every 6- 8 hours as needed for pain 28 tablet 0  . Inulin (FIBER CHOICE PO) Take 1 capsule by mouth daily.    . Iron-FA-B Cmp-C-Biot-Probiotic (FUSION PLUS) CAPS Take 1 tablet by mouth daily. (Patient taking differently: Take  1 tablet once a week by mouth. ) 90 capsule 3  . loratadine (ALAVERT) 10 MG tablet Take 5 mg daily by mouth.     . memantine (NAMENDA) 5 MG tablet Take 1 tablet (5 mg total) by mouth at bedtime. 30 tablet 3  . metoprolol tartrate (LOPRESSOR) 25 MG tablet Take 1 tablet (25 mg total) by mouth 2 (two) times daily. 60 tablet 0  . mirabegron ER (MYRBETRIQ) 25 MG TB24 tablet Take 25 mg by mouth daily.    Marland Kitchen omeprazole (PRILOSEC) 40 MG capsule Take 30- 60 min before your first and last meals of the day (Patient taking differently: Take 40 mg daily by mouth. Take 30- 60 min before your first and last meals of the day) 60 capsule 11  . Polyethyl Glycol-Propyl Glycol (SYSTANE) 0.4-0.3 % SOLN Apply 1 drop to eye 2 (two) times daily as needed (dry eyes).    . polyethylene glycol (MIRALAX / GLYCOLAX) packet Take 17 g by mouth daily as needed for mild constipation, moderate constipation or severe constipation.    . potassium chloride SA (K-DUR,KLOR-CON) 20 MEQ tablet TAKE 1 TABLET ONCE DAILY. 90 tablet 0  . saccharomyces boulardii (FLORASTOR) 250 MG capsule Take 250 mg by mouth 2 (two) times daily.    . vitamin B-12 (CYANOCOBALAMIN) 500 MCG tablet Take 500 mcg by mouth daily.     Current Facility-Administered Medications on File Prior to Visit  Medication Dose Route  Frequency Provider Last Rate Last Dose  . lidocaine (PF) (XYLOCAINE) 1 % injection 2 mL  2 mL Other Once Magnus Sinning, MD        Pulse 82   Temp (!) 97.4 F (36.3 C) (Oral)   SpO2 95%       Objective:   Physical Exam  Constitutional: She appears well-developed and well-nourished. No distress.  Cardiovascular: Normal rate, regular rhythm, normal heart sounds and intact distal pulses. Exam reveals no gallop and no friction rub.  No murmur heard. Pulmonary/Chest: Effort normal and breath sounds normal. No respiratory distress. She has no wheezes. She has no rales. She exhibits no tenderness.  Skin: She is not diaphoretic.  Did not look at wound as Zakirah seemed uncomfortable sitting in wheel chair.  Psychiatric: She has a normal mood and affect. Her behavior is normal.  Nursing note and vitals reviewed.     Assessment & Plan:  1. Abscess of coccyx - Will check CBC again on daughter request.  - Continue with plan of care  - Only take narcotics for break through pain  - CBC with Differential/Platelet - Follow up as needed  Dorothyann Peng, NP

## 2017-08-10 ENCOUNTER — Encounter: Payer: Self-pay | Admitting: Family Medicine

## 2017-08-14 ENCOUNTER — Telehealth: Payer: Self-pay | Admitting: *Deleted

## 2017-08-14 NOTE — Telephone Encounter (Signed)
Caller name: [name] Relationship to patient: Daughter Can be reached: [phone] Pharmacy: [facility]   Reason for call: Requesting refill of hydrocodone-acetaminophen 5 mg tablets. They have 6 tablets left and anticipate running out by next week.

## 2017-08-16 ENCOUNTER — Other Ambulatory Visit: Payer: Self-pay | Admitting: Adult Health

## 2017-08-16 DIAGNOSIS — N183 Chronic kidney disease, stage 3 (moderate): Secondary | ICD-10-CM | POA: Diagnosis not present

## 2017-08-16 DIAGNOSIS — G309 Alzheimer's disease, unspecified: Secondary | ICD-10-CM | POA: Diagnosis not present

## 2017-08-16 DIAGNOSIS — R0602 Shortness of breath: Secondary | ICD-10-CM | POA: Diagnosis not present

## 2017-08-16 DIAGNOSIS — L0501 Pilonidal cyst with abscess: Secondary | ICD-10-CM | POA: Diagnosis not present

## 2017-08-16 DIAGNOSIS — E1122 Type 2 diabetes mellitus with diabetic chronic kidney disease: Secondary | ICD-10-CM | POA: Diagnosis not present

## 2017-08-16 DIAGNOSIS — I509 Heart failure, unspecified: Secondary | ICD-10-CM | POA: Diagnosis not present

## 2017-08-16 DIAGNOSIS — E1129 Type 2 diabetes mellitus with other diabetic kidney complication: Secondary | ICD-10-CM | POA: Diagnosis not present

## 2017-08-16 DIAGNOSIS — F0280 Dementia in other diseases classified elsewhere without behavioral disturbance: Secondary | ICD-10-CM | POA: Diagnosis not present

## 2017-08-16 DIAGNOSIS — I5033 Acute on chronic diastolic (congestive) heart failure: Secondary | ICD-10-CM | POA: Diagnosis not present

## 2017-08-16 DIAGNOSIS — I130 Hypertensive heart and chronic kidney disease with heart failure and stage 1 through stage 4 chronic kidney disease, or unspecified chronic kidney disease: Secondary | ICD-10-CM | POA: Diagnosis not present

## 2017-08-16 DIAGNOSIS — E1169 Type 2 diabetes mellitus with other specified complication: Secondary | ICD-10-CM | POA: Diagnosis not present

## 2017-08-16 DIAGNOSIS — M4628 Osteomyelitis of vertebra, sacral and sacrococcygeal region: Secondary | ICD-10-CM | POA: Diagnosis not present

## 2017-08-16 MED ORDER — HYDROCODONE-ACETAMINOPHEN 5-325 MG PO TABS: 1.0000 | ORAL_TABLET | Freq: Four times a day (QID) | ORAL | 0 refills | Status: DC | PRN

## 2017-08-16 NOTE — Telephone Encounter (Signed)
Spoke to [name] and advised that if pt needs more medication other than the 30 tabs she was just prescribed than she will need to come in.  [name] agreed and will call for appointment before pt runs out of medication.

## 2017-08-16 NOTE — Telephone Encounter (Signed)
Medication send to pharmacy. 

## 2017-08-16 NOTE — Telephone Encounter (Signed)
If she needs anymore after this course then I will need to see her

## 2017-08-16 NOTE — Telephone Encounter (Signed)
Tried reaching [place].  Received a message that the mailbox is full and cannot accept messages.  Will try again at a later time.

## 2017-08-16 NOTE — Telephone Encounter (Signed)
Daughter is returning call for Anna [name] [phone]

## 2017-08-16 NOTE — Telephone Encounter (Signed)
Spoke to [name] and informed her to pick up rx at the pharmacy.  Anna [name] would like to know when she should bring pt back for narcotic follow up. Please advise.  Thanks!!

## 2017-08-17 DIAGNOSIS — G309 Alzheimer's disease, unspecified: Secondary | ICD-10-CM | POA: Diagnosis not present

## 2017-08-17 DIAGNOSIS — M4628 Osteomyelitis of vertebra, sacral and sacrococcygeal region: Secondary | ICD-10-CM | POA: Diagnosis not present

## 2017-08-17 DIAGNOSIS — I130 Hypertensive heart and chronic kidney disease with heart failure and stage 1 through stage 4 chronic kidney disease, or unspecified chronic kidney disease: Secondary | ICD-10-CM | POA: Diagnosis not present

## 2017-08-17 DIAGNOSIS — E1169 Type 2 diabetes mellitus with other specified complication: Secondary | ICD-10-CM | POA: Diagnosis not present

## 2017-08-17 DIAGNOSIS — E1122 Type 2 diabetes mellitus with diabetic chronic kidney disease: Secondary | ICD-10-CM | POA: Diagnosis not present

## 2017-08-17 DIAGNOSIS — L0501 Pilonidal cyst with abscess: Secondary | ICD-10-CM | POA: Diagnosis not present

## 2017-08-17 DIAGNOSIS — I509 Heart failure, unspecified: Secondary | ICD-10-CM | POA: Diagnosis not present

## 2017-08-17 DIAGNOSIS — F0280 Dementia in other diseases classified elsewhere without behavioral disturbance: Secondary | ICD-10-CM | POA: Diagnosis not present

## 2017-08-17 DIAGNOSIS — N183 Chronic kidney disease, stage 3 (moderate): Secondary | ICD-10-CM | POA: Diagnosis not present

## 2017-08-21 ENCOUNTER — Telehealth: Payer: Self-pay | Admitting: Cardiovascular Disease

## 2017-08-21 DIAGNOSIS — L0501 Pilonidal cyst with abscess: Secondary | ICD-10-CM | POA: Diagnosis not present

## 2017-08-21 DIAGNOSIS — N183 Chronic kidney disease, stage 3 (moderate): Secondary | ICD-10-CM | POA: Diagnosis not present

## 2017-08-21 DIAGNOSIS — I509 Heart failure, unspecified: Secondary | ICD-10-CM | POA: Diagnosis not present

## 2017-08-21 DIAGNOSIS — E1169 Type 2 diabetes mellitus with other specified complication: Secondary | ICD-10-CM | POA: Diagnosis not present

## 2017-08-21 DIAGNOSIS — E1122 Type 2 diabetes mellitus with diabetic chronic kidney disease: Secondary | ICD-10-CM | POA: Diagnosis not present

## 2017-08-21 DIAGNOSIS — M4628 Osteomyelitis of vertebra, sacral and sacrococcygeal region: Secondary | ICD-10-CM | POA: Diagnosis not present

## 2017-08-21 DIAGNOSIS — G309 Alzheimer's disease, unspecified: Secondary | ICD-10-CM | POA: Diagnosis not present

## 2017-08-21 DIAGNOSIS — F0280 Dementia in other diseases classified elsewhere without behavioral disturbance: Secondary | ICD-10-CM | POA: Diagnosis not present

## 2017-08-21 DIAGNOSIS — I130 Hypertensive heart and chronic kidney disease with heart failure and stage 1 through stage 4 chronic kidney disease, or unspecified chronic kidney disease: Secondary | ICD-10-CM | POA: Diagnosis not present

## 2017-08-21 NOTE — Telephone Encounter (Signed)
Returned call to patient's daughter.Daughter seemed anxious.Stated she takes care of her mother.She gave her mother alittle salt during Christmas.She has noticed alittle wheezing and increased swelling in right leg.She gave her a extra 10 mg of lasix yesterday and today.Advised ok to give her another extra 10 mg tomorrow and then return to normal dose.Appointment scheduled with Jory Sims DNP.Advised to bring all medications to appointment.

## 2017-08-21 NOTE — Progress Notes (Deleted)
Cardiology Office Note   Date:  08/21/2017   ID:  Anna [name], DOB December 03, 1928, MRN [id number]  PCP:  Dorothyann Peng, NP  Cardiologist:  Skeet Latch MD No chief complaint on file.    History of Present Illness: Anna [name] is a 83 y.o. female who presents for ongoing assessment and management of chronic systolic and diastolic heart failure, hypertensive heart disease, persistent atrial fibrillation, with other history to include diabetes, prior GI bleed, and CKD Stage III.  Most recent echocardiogram revealed an LVEF of 45% to 50%.    Past Medical History:  Diagnosis Date  . Alzheimer's disease   . Atrial fibrillation ([place])   . Bronchitis   . CHF (congestive heart failure) ([place])   . Chronic renal insufficiency, stage III (moderate) ([facility])   . Diabetes mellitus without complication ([place])   . Dyspnea    with exertion  . GERD (gastroesophageal reflux disease)   . GI bleeding 11/2013   due to supratherapeutic INR  . Hyperlipidemia   . Hypertension   . Hyperthyroidism    on amiodarone  . LBBB (left bundle branch block)   . Overactive bladder   . Overweight   . Persistent atrial fibrillation ([place])   . Pneumonia   . Thyroid mass    LLL  . UTI (urinary tract infection)   . Vitamin B12 deficiency   . Vitamin D deficiency     Past Surgical History:  Procedure Laterality Date  . ABDOMINAL HYSTERECTOMY    . ESOPHAGOGASTRODUODENOSCOPY (EGD) WITH PROPOFOL N/A 02/16/2017   Procedure: ESOPHAGOGASTRODUODENOSCOPY (EGD) WITH PROPOFOL;  Surgeon: Irene Shipper, MD;  Location: [facility] ENDOSCOPY;  Service: Endoscopy;  Laterality: N/A;  . IR FLUORO GUIDE CV MIDLINE PICC RIGHT  07/21/2017  . IR US GUIDE VASC ACCESS RIGHT  07/21/2017  . IRRIGATION AND DEBRIDEMENT ABSCESS N/A 07/11/2017   Procedure: IRRIGATION AND DEBRIDEMENT OF SACRAL ABSCESS;  Surgeon: Ralene Ok, MD;  Location: [facility] ORS;  Service: General;  Laterality: N/A;  SACRUM     Current Outpatient Medications    Medication Sig Dispense Refill  . ACCU-CHEK AVIVA PLUS test strip PATIENT TO CHECK SUGARS ONCE DAILY.  3  . acetaminophen (TYLENOL) 500 MG tablet Take 500 mg by mouth every 6 (six) hours as needed for fever.     Marland Kitchen amoxicillin-clavulanate (AUGMENTIN) 875-125 MG tablet Take 1 tablet by mouth every 12 (twelve) hours. 60 tablet 1  . aspirin 325 MG tablet Take 325 mg by mouth daily.    . calcitRIOL (ROCALTROL) 0.5 MCG capsule Take 1 capsule (0.5 mcg total) by mouth 3 (three) times a week. Monday, Wednesday, and Friday only 36 capsule 3  . carbamide peroxide (DEBROX) 6.5 % OTIC solution Place 5 drops into both ears 2 (two) times daily. (Patient taking differently: Place 5 drops 2 (two) times daily as needed into both ears (irritation). ) 15 mL 1  . cholecalciferol (VITAMIN D) 1000 units tablet Take 1,000 Units by mouth daily.    Marland Kitchen docusate sodium (COLACE) 100 MG capsule Take 100 mg by mouth daily as needed for mild constipation.    Marland Kitchen donepezil (ARICEPT) 10 MG tablet TAKE 1 TABLET ONCE DAILY. 90 tablet 3  . fluconazole (DIFLUCAN) 100 MG tablet Take 1 tablet (100 mg total) by mouth daily. 10 tablet 3  . fluticasone (FLONASE) 50 MCG/ACT nasal spray Place 2 sprays into both nostrils daily.    . furosemide (LASIX) 20 MG tablet Take 1.5 tablets (30 mg total) by mouth daily.  Take an extra 0.5 tablet daily as needed. 60 tablet 5  . glimepiride (AMARYL) 2 MG tablet TAKE 1 TABLET ONCE DAILY. (Patient taking differently: TAKE 0.5 TABLET ONCE DAILY) 90 tablet 3  . HYDROcodone-acetaminophen (NORCO) 5-325 MG tablet Take 1 tablet by mouth every 6 (six) hours as needed for moderate pain. 30 tablet 0  . Inulin (FIBER CHOICE PO) Take 1 capsule by mouth daily.    . Iron-FA-B Cmp-C-Biot-Probiotic (FUSION PLUS) CAPS Take 1 tablet by mouth daily. (Patient taking differently: Take 1 tablet once a week by mouth. ) 90 capsule 3  . loratadine (ALAVERT) 10 MG tablet Take 5 mg daily by mouth.     . memantine (NAMENDA) 5 MG tablet  Take 1 tablet (5 mg total) by mouth at bedtime. 30 tablet 3  . metoprolol tartrate (LOPRESSOR) 25 MG tablet Take 1 tablet (25 mg total) by mouth 2 (two) times daily. 60 tablet 0  . mirabegron ER (MYRBETRIQ) 25 MG TB24 tablet Take 25 mg by mouth daily.    Marland Kitchen omeprazole (PRILOSEC) 40 MG capsule Take 30- 60 min before your first and last meals of the day (Patient taking differently: Take 40 mg daily by mouth. Take 30- 60 min before your first and last meals of the day) 60 capsule 11  . Polyethyl Glycol-Propyl Glycol (SYSTANE) 0.4-0.3 % SOLN Apply 1 drop to eye 2 (two) times daily as needed (dry eyes).    . polyethylene glycol (MIRALAX / GLYCOLAX) packet Take 17 g by mouth daily as needed for mild constipation, moderate constipation or severe constipation.    . potassium chloride SA (K-DUR,KLOR-CON) 20 MEQ tablet TAKE 1 TABLET ONCE DAILY. 90 tablet 0  . saccharomyces boulardii (FLORASTOR) 250 MG capsule Take 250 mg by mouth 2 (two) times daily.    . vitamin B-12 (CYANOCOBALAMIN) 500 MCG tablet Take 500 mcg by mouth daily.     Current Facility-Administered Medications  Medication Dose Route Frequency Provider Last Rate Last Dose  . lidocaine (PF) (XYLOCAINE) 1 % injection 2 mL  2 mL Other Once Magnus Sinning, MD        Allergies:   Pollen extract    Social History:  The patient  reports that  has never smoked. she has never used smokeless tobacco. She reports that she does not drink alcohol or use drugs.   Family History:  The patient's family history includes Diabetes in her mother; Heart disease in her father.    ROS: All other systems are reviewed and negative. Unless otherwise mentioned in H&P    PHYSICAL EXAM: VS:  There were no vitals taken for this visit. , BMI There is no height or weight on file to calculate BMI. GEN: Well nourished, well developed, in no acute distress  HEENT: normal  Neck: no JVD, carotid bruits, or masses Cardiac: ***RRR; no murmurs, rubs, or gallops,no edema    Respiratory:  clear to auscultation bilaterally, normal work of breathing GI: soft, nontender, nondistended, + BS MS: no deformity or atrophy  Skin: warm and dry, no rash Neuro:  Strength and sensation are intact Psych: euthymic mood, full affect   EKG:  EKG {ACTION; IS/IS [id number]} ordered today. The ekg ordered today demonstrates ***   Recent Labs: 09/15/2016: Magnesium 2.0 12/30/2016: TSH 0.421 07/27/2017: ALT 15; B Natriuretic Peptide 246.8 07/29/2017: BUN 17; Creatinine 1.1; Potassium 4.5; Sodium 142 08/09/2017: Hemoglobin 14.9; Platelets 237.0    Lipid Panel    Component Value Date/Time   CHOL 134 05/26/2017 0917  TRIG 96 05/26/2017 0917   HDL 43 05/26/2017 0917   CHOLHDL 3.1 05/26/2017 0917   LDLCALC 72 05/26/2017 0917      Wt Readings from Last 3 Encounters:  07/16/17 173 lb 11.6 oz (78.8 kg)  03/15/17 178 lb (80.7 kg)  03/10/17 178 lb (80.7 kg)      Other studies Reviewed: Echo 09/16/16: Study Conclusions  - Left ventricle: The cavity size was normal. Wall thickness was normal. Systolic function was moderately reduced. The estimated ejection fraction was in the range of 35% to 40%. Moderate diffuse hypokinesis with no identifiable regional variations. Acoustic contrast opacification revealed no evidence ofthrombus. - Ventricular septum: Septal motion showed abnormal function, dyssynergy, and paradox. These changes are consistent with a left bundle branch block. - Left atrium: The atrium was mildly dilated.    ASSESSMENT AND PLAN:  1.  ***   Current medicines are reviewed at length with the patient today.    Labs/ tests ordered today include: *** Phill Myron. [place] Pugh, ANP, AACC   08/21/2017 3:15 PM    [facility] [address] [place], [place] [address] Phone: [phone]; Fax: [phone]

## 2017-08-21 NOTE — Telephone Encounter (Signed)
Pt c/o swelling: STAT is pt has developed SOB within 24 hours  1) How much weight have you gained and in what time span? n/a  2) If swelling, where is the swelling located? right leg below the knee and in feet   3) Are you currently taking a fluid pill? Yes   4) Are you currently SOB? "A little bit of wheezing"    5) Do you have a log of your daily weights (if so, list)? no  6) Have you gained 3 pounds in a day or 5 pounds in a week? n/a  7) Have you traveled recently? no

## 2017-08-23 ENCOUNTER — Ambulatory Visit: Payer: Medicare HMO | Admitting: Adult Health

## 2017-08-23 ENCOUNTER — Telehealth: Payer: Self-pay | Admitting: Adult Health

## 2017-08-23 ENCOUNTER — Encounter (HOSPITAL_BASED_OUTPATIENT_CLINIC_OR_DEPARTMENT_OTHER): Payer: Medicare HMO | Attending: Internal Medicine

## 2017-08-23 DIAGNOSIS — L98496 Non-pressure chronic ulcer of skin of other sites with bone involvement without evidence of necrosis: Secondary | ICD-10-CM | POA: Insufficient documentation

## 2017-08-23 DIAGNOSIS — E1122 Type 2 diabetes mellitus with diabetic chronic kidney disease: Secondary | ICD-10-CM | POA: Diagnosis not present

## 2017-08-23 DIAGNOSIS — R627 Adult failure to thrive: Secondary | ICD-10-CM | POA: Insufficient documentation

## 2017-08-23 DIAGNOSIS — I130 Hypertensive heart and chronic kidney disease with heart failure and stage 1 through stage 4 chronic kidney disease, or unspecified chronic kidney disease: Secondary | ICD-10-CM | POA: Diagnosis not present

## 2017-08-23 DIAGNOSIS — G309 Alzheimer's disease, unspecified: Secondary | ICD-10-CM | POA: Insufficient documentation

## 2017-08-23 DIAGNOSIS — N183 Chronic kidney disease, stage 3 (moderate): Secondary | ICD-10-CM | POA: Diagnosis not present

## 2017-08-23 DIAGNOSIS — E11622 Type 2 diabetes mellitus with other skin ulcer: Secondary | ICD-10-CM | POA: Insufficient documentation

## 2017-08-23 DIAGNOSIS — I5032 Chronic diastolic (congestive) heart failure: Secondary | ICD-10-CM | POA: Insufficient documentation

## 2017-08-23 DIAGNOSIS — I4891 Unspecified atrial fibrillation: Secondary | ICD-10-CM | POA: Insufficient documentation

## 2017-08-23 DIAGNOSIS — F0280 Dementia in other diseases classified elsewhere without behavioral disturbance: Secondary | ICD-10-CM | POA: Diagnosis not present

## 2017-08-23 DIAGNOSIS — L0501 Pilonidal cyst with abscess: Secondary | ICD-10-CM | POA: Insufficient documentation

## 2017-08-23 DIAGNOSIS — L98426 Non-pressure chronic ulcer of back with bone involvement without evidence of necrosis: Secondary | ICD-10-CM | POA: Diagnosis not present

## 2017-08-23 DIAGNOSIS — I509 Heart failure, unspecified: Secondary | ICD-10-CM | POA: Diagnosis not present

## 2017-08-23 DIAGNOSIS — E1169 Type 2 diabetes mellitus with other specified complication: Secondary | ICD-10-CM | POA: Diagnosis not present

## 2017-08-23 DIAGNOSIS — M4628 Osteomyelitis of vertebra, sacral and sacrococcygeal region: Secondary | ICD-10-CM | POA: Diagnosis not present

## 2017-08-23 NOTE — Telephone Encounter (Signed)
Copied from [place] [id number]. Topic: Quick Communication - See Telephone Encounter >> Aug 23, 2017  3:39 PM [place], Laban Emperor wrote: CRM for notification. See Telephone encounter for: Margaretha Sheffield with [facility] is requesting a stat UA for tomorrow. States pt daughter took one last night and it was cloudy.  Contact number [phone]  08/23/17.

## 2017-08-23 NOTE — Telephone Encounter (Signed)
[place] for order for STAT! UA

## 2017-08-23 NOTE — Telephone Encounter (Signed)
Spoke to [place] and informed her Tommi Rumps stated to proceed with orders for OT for once weekly for 3 weeks.

## 2017-08-23 NOTE — Telephone Encounter (Signed)
Spoke to [place] and informed her to proceed with order.

## 2017-08-23 NOTE — Telephone Encounter (Signed)
Ok for verbal orders ?

## 2017-08-23 NOTE — Telephone Encounter (Signed)
Copied from [place]. >> Aug 23, 2017  1:00 PM Corie Chiquito, [place] wrote: Reason for CRM: Belenda Cruise from [facility] called because she needs verbal orders for OT If someone could give her a call back at [phone]

## 2017-08-24 DIAGNOSIS — G309 Alzheimer's disease, unspecified: Secondary | ICD-10-CM | POA: Diagnosis not present

## 2017-08-24 DIAGNOSIS — I509 Heart failure, unspecified: Secondary | ICD-10-CM | POA: Diagnosis not present

## 2017-08-24 DIAGNOSIS — F0280 Dementia in other diseases classified elsewhere without behavioral disturbance: Secondary | ICD-10-CM | POA: Diagnosis not present

## 2017-08-24 DIAGNOSIS — I130 Hypertensive heart and chronic kidney disease with heart failure and stage 1 through stage 4 chronic kidney disease, or unspecified chronic kidney disease: Secondary | ICD-10-CM | POA: Diagnosis not present

## 2017-08-24 DIAGNOSIS — E1122 Type 2 diabetes mellitus with diabetic chronic kidney disease: Secondary | ICD-10-CM | POA: Diagnosis not present

## 2017-08-24 DIAGNOSIS — L0501 Pilonidal cyst with abscess: Secondary | ICD-10-CM | POA: Diagnosis not present

## 2017-08-24 DIAGNOSIS — M4628 Osteomyelitis of vertebra, sacral and sacrococcygeal region: Secondary | ICD-10-CM | POA: Diagnosis not present

## 2017-08-24 DIAGNOSIS — E1169 Type 2 diabetes mellitus with other specified complication: Secondary | ICD-10-CM | POA: Diagnosis not present

## 2017-08-24 DIAGNOSIS — N183 Chronic kidney disease, stage 3 (moderate): Secondary | ICD-10-CM | POA: Diagnosis not present

## 2017-08-25 NOTE — Telephone Encounter (Signed)
Anna [name] calling to make sure you received stat urine results from [facility] yesterday. Anna [name] was to have them faxed to you.   Please call the pt's daughter to be seen.

## 2017-08-25 NOTE — Telephone Encounter (Signed)
Received and forwarded to [facility]

## 2017-08-28 ENCOUNTER — Telehealth: Payer: Self-pay | Admitting: Adult Health

## 2017-08-28 NOTE — Telephone Encounter (Signed)
Anna [name] w/ [employer] [phone]  Called in to follow up on form that was faxed over on Thursday 08/24/17. She said that the form is in regards to a wound pump.    Please confirm receipt. If not received please contact at the # above to request.

## 2017-08-29 NOTE — Telephone Encounter (Signed)
Called and spoke to [facility] and informed her that Dr. Bobby Rumpf (ID) is supervising wound therapy. Asked that she call his office for order.  No further action required.  Will now close the note.

## 2017-08-30 ENCOUNTER — Encounter: Payer: Self-pay | Admitting: Family Medicine

## 2017-08-30 ENCOUNTER — Ambulatory Visit (INDEPENDENT_AMBULATORY_CARE_PROVIDER_SITE_OTHER): Payer: Medicare HMO | Admitting: Family Medicine

## 2017-08-30 VITALS — BP 140/88 | HR 93 | Temp 97.6°F | Resp 16

## 2017-08-30 DIAGNOSIS — M4628 Osteomyelitis of vertebra, sacral and sacrococcygeal region: Secondary | ICD-10-CM

## 2017-08-30 DIAGNOSIS — R52 Pain, unspecified: Secondary | ICD-10-CM

## 2017-08-30 DIAGNOSIS — A1801 Tuberculosis of spine: Secondary | ICD-10-CM | POA: Diagnosis not present

## 2017-08-30 MED ORDER — HYDROCODONE-ACETAMINOPHEN 5-325 MG PO TABS: 1.0000 | ORAL_TABLET | Freq: Three times a day (TID) | ORAL | 0 refills | Status: DC | PRN

## 2017-08-30 NOTE — Progress Notes (Signed)
ACUTE VISIT   HPI:  Chief Complaint  Patient presents with  . Medication Refill    [name] is a 84 y.o. female, who is here today with her daughter and CMA. PCP was not available to see patient today. She has history of Alzheimer's disease, atrial fibrillation, CHF, DM 2, and CKD 3 among some. She is not verbal ,so information is provided by daughter,who is very upset.  S/P coccygeal abscess surgical drainage  07/11/17. He completed antibiotic treatment and currently she is following with wound clinic a couple times per week.  Her daughter is here today requesting refills on hydrocodone-Acetaminophen 5-325 mg.  She has not had fever or chills. Daughter and caregiver have no noted MS changes.  Severe pain that affects her BP and causes distress.  According to patient, surgeon told her that "PCP has to prescribe pain medication." Daughter is upset because she has received small prescriptions on current frequency is not enough to manage her pain. Medication seems to help with pain when it is giving, but she does not have enough to give her 1 tab qid as prescribed.  No side effects from Hydrocodone reported today.  Review of Systems  Constitutional: Positive for fatigue. Negative for chills and fever.  Respiratory: Negative for cough, shortness of breath and wheezing.   Cardiovascular: Negative for leg swelling.  Gastrointestinal: Negative for abdominal pain, nausea and vomiting.  Genitourinary: Negative for decreased urine volume and dysuria.  Musculoskeletal: Positive for gait problem.  Neurological: Negative for syncope and headaches.  Psychiatric/Behavioral: Negative for confusion.      Current Outpatient Medications on File Prior to Visit  Medication Sig Dispense Refill  . ACCU-CHEK AVIVA PLUS test strip PATIENT TO CHECK SUGARS ONCE DAILY.  3  . acetaminophen (TYLENOL) 500 MG tablet Take 500 mg by mouth every 6 (six) hours as needed for fever.       Marland Kitchen amoxicillin-clavulanate (AUGMENTIN) 875-125 MG tablet Take 1 tablet by mouth every 12 (twelve) hours. 60 tablet 1  . aspirin 325 MG tablet Take 325 mg by mouth daily.    . calcitRIOL (ROCALTROL) 0.5 MCG capsule Take 1 capsule (0.5 mcg total) by mouth 3 (three) times a week. Monday, Wednesday, and Friday only 36 capsule 3  . carbamide peroxide (DEBROX) 6.5 % OTIC solution Place 5 drops into both ears 2 (two) times daily. (Patient taking differently: Place 5 drops 2 (two) times daily as needed into both ears (irritation). ) 15 mL 1  . cholecalciferol (VITAMIN D) 1000 units tablet Take 1,000 Units by mouth daily.    Marland Kitchen docusate sodium (COLACE) 100 MG capsule Take 100 mg by mouth daily as needed for mild constipation.    Marland Kitchen donepezil (ARICEPT) 10 MG tablet TAKE 1 TABLET ONCE DAILY. 90 tablet 3  . fluconazole (DIFLUCAN) 100 MG tablet Take 1 tablet (100 mg total) by mouth daily. 10 tablet 3  . fluticasone (FLONASE) 50 MCG/ACT nasal spray Place 2 sprays into both nostrils daily.    . furosemide (LASIX) 20 MG tablet Take 1.5 tablets (30 mg total) by mouth daily. Take an extra 0.5 tablet daily as needed. 60 tablet 5  . glimepiride (AMARYL) 2 MG tablet TAKE 1 TABLET ONCE DAILY. (Patient taking differently: TAKE 0.5 TABLET ONCE DAILY) 90 tablet 3  . Inulin (FIBER CHOICE PO) Take 1 capsule by mouth daily.    . Iron-FA-B Cmp-C-Biot-Probiotic (FUSION PLUS) CAPS Take 1 tablet by mouth daily. (Patient taking differently: Take 1  tablet once a week by mouth. ) 90 capsule 3  . loratadine (ALAVERT) 10 MG tablet Take 5 mg daily by mouth.     . memantine (NAMENDA) 5 MG tablet Take 1 tablet (5 mg total) by mouth at bedtime. 30 tablet 3  . metoprolol tartrate (LOPRESSOR) 25 MG tablet Take 1 tablet (25 mg total) by mouth 2 (two) times daily. 60 tablet 0  . mirabegron ER (MYRBETRIQ) 25 MG TB24 tablet Take 25 mg by mouth daily.    Marland Kitchen omeprazole (PRILOSEC) 40 MG capsule Take 30- 60 min before your first and last meals of the  day (Patient taking differently: Take 40 mg daily by mouth. Take 30- 60 min before your first and last meals of the day) 60 capsule 11  . Polyethyl Glycol-Propyl Glycol (SYSTANE) 0.4-0.3 % SOLN Apply 1 drop to eye 2 (two) times daily as needed (dry eyes).    . polyethylene glycol (MIRALAX / GLYCOLAX) packet Take 17 g by mouth daily as needed for mild constipation, moderate constipation or severe constipation.    . potassium chloride SA (K-DUR,KLOR-CON) 20 MEQ tablet TAKE 1 TABLET ONCE DAILY. 90 tablet 0  . saccharomyces boulardii (FLORASTOR) 250 MG capsule Take 250 mg by mouth 2 (two) times daily.    . vitamin B-12 (CYANOCOBALAMIN) 500 MCG tablet Take 500 mcg by mouth daily.     Current Facility-Administered Medications on File Prior to Visit  Medication Dose Route Frequency Provider Last Rate Last Dose  . lidocaine (PF) (XYLOCAINE) 1 % injection 2 mL  2 mL Other Once Magnus Sinning, MD         Past Medical History:  Diagnosis Date  . Alzheimer's disease   . Atrial fibrillation ([place])   . Bronchitis   . CHF (congestive heart failure) ([place])   . Chronic renal insufficiency, stage III (moderate) ([facility])   . Diabetes mellitus without complication ([place])   . Dyspnea    with exertion  . GERD (gastroesophageal reflux disease)   . GI bleeding 11/2013   due to supratherapeutic INR  . Hyperlipidemia   . Hypertension   . Hyperthyroidism    on amiodarone  . LBBB (left bundle branch block)   . Overactive bladder   . Overweight   . Persistent atrial fibrillation ([place])   . Pneumonia   . Thyroid mass    LLL  . UTI (urinary tract infection)   . Vitamin B12 deficiency   . Vitamin D deficiency    Allergies  Allergen Reactions  . Pollen Extract     Sneezing, watery eyes    Social History   Socioeconomic History  . Marital status: Divorced    Spouse name: None  . Number of children: None  . Years of education: None  . Highest education level: None  Social Needs  . Financial resource  strain: Not very hard  . Food insecurity - worry: Never true  . Food insecurity - inability: Never true  . Transportation needs - medical: No  . Transportation needs - non-medical: No  Occupational History  . None  Tobacco Use  . Smoking status: Never Smoker  . Smokeless tobacco: Never Used  Substance and Sexual Activity  . Alcohol use: No    Alcohol/week: 0.0 oz  . Drug use: No  . Sexual activity: None  Other Topics Concern  . None  Social History Narrative   Pt recently moved to [place] with daughter from [place]:   08/30/17 1219  BP:  140/88  Pulse: 93  Resp: 16  Temp: 97.6 F (36.4 C)  SpO2: 98%   There is no height or weight on file to calculate BMI.   Physical Exam  Nursing note and vitals reviewed. Constitutional: She appears lethargic. She is cooperative. She is easily aroused. No distress.  HENT:  Head: Normocephalic and atraumatic.  Cardiovascular: Normal rate. An irregular rhythm present.  No murmur heard. Respiratory: Effort normal and breath sounds normal. No respiratory distress.  GI: Soft. There is no tenderness.  Musculoskeletal: She exhibits no edema.  Neurological: She is easily aroused. She appears lethargic.  She is in a wheel chair,restrained with waist band.  Skin: Skin is warm.  Coccygeal wound was just packed,so daughter prefers not to uncover wound.  Psychiatric: She is communicative.     ASSESSMENT AND PLAN:    Crystallee was seen today for medication refill.  Diagnoses and all orders for this visit:  Abscess of coccyx -     HYDROcodone-acetaminophen (NORCO) 5-325 MG tablet; Take 1 tablet by mouth every 8 (eight) hours as needed for moderate pain.  Encounter for pain management   Tried to provide education about new regulations in regard to chronic pain management as well as medication side effects but daughter is not interested in information.She is very upset about the fact she was asked to come to the office and not  getting enough medication to last longer. She wants the Rx for Hydrocodone today because she just has 6.5 tabs left and it is difficult for her to bring her to the office. I agreed with feeling prescription for Hydrocodone-Acetaminophen 3-325 mg to continue 3 times per day as needed for pain. Encouraged patient's daughter to follow with PCP and discuss pain management. She cannot collect urine due to difficulty standing up and obtaining sample, so it was not done today.    28 min face to face OV. > 50% was dedicated to discussion of Dx, prognosis, treatment options, and some side effects of medications.   Return in about 8 days (around 09/07/2017) for pain management with PCP.     -[name] was advised to seek immediate medical attention if sudden worsening of pain or MS changes among others.     Dejai G. Martinique, MD  [facility]. [place] office.

## 2017-08-30 NOTE — Patient Instructions (Signed)
[name] I have seen you today for an acute visit.  A few things to remember from today's visit:   Abscess of coccyx   Medications prescribed today are intended for short period of time and will not be refill upon request, a follow up appointment might be necessary to discuss continuation of of treatment if appropriate.   Acetaminophen; Hydrocodone tablets or capsules What is this medicine? ACETAMINOPHEN; HYDROCODONE (a set a MEE noe fen; hye droe KOE done) is a pain reliever. It is used to treat moderate to severe pain. This medicine may be used for other purposes; ask your health care provider or pharmacist if you have questions. COMMON BRAND NAME(S): Anexsia, Bancap HC, Ceta-Plus, Co-Gesic, Comfortpak, Dolagesic, [employer], DuoCet, Hydrocet, Hydrogesic, [place], Lorcet HD, Lorcet Plus, Lortab, Margesic H, Maxidone, [place], Polygesic, [place], [place], Cabin crew, Vicodin, Vicodin ES, Vicodin HP, Charlane Ferretti What should I tell my health care provider before I take this medicine? They need to know if you have any of these conditions: -brain tumor -Crohn's disease, inflammatory bowel disease, or ulcerative colitis -drug abuse or addiction -head injury -heart or circulation problems -if you often drink alcohol -kidney disease or problems going to the bathroom -liver disease -lung disease, asthma, or breathing problems -an unusual or allergic reaction to acetaminophen, hydrocodone, other opioid analgesics, other medicines, foods, dyes, or preservatives -pregnant or trying to get pregnant -breast-feeding How should I use this medicine? Take this medicine by mouth with a glass of water. Follow the directions on the prescription label. You can take it with or without food. If it upsets your stomach, take it with food. Do not take your medicine more often than directed. A special MedGuide will be given to you by the pharmacist with each prescription and refill. Be sure to  read this information carefully each time. Talk to your pediatrician regarding the use of this medicine in children. Special care may be needed. Overdosage: If you think you have taken too much of this medicine contact a poison control center or emergency room at once. NOTE: This medicine is only for you. Do not share this medicine with others. What if I miss a dose? If you miss a dose, take it as soon as you can. If it is almost time for your next dose, take only that dose. Do not take double or extra doses. What may interact with this medicine? This medicine may interact with the following medications: -alcohol -antiviral medicines for HIV or AIDS -atropine -antihistamines for allergy, cough and cold -certain antibiotics like erythromycin, clarithromycin -certain medicines for anxiety or sleep -certain medicines for bladder problems like oxybutynin, tolterodine -certain medicines for depression like amitriptyline, fluoxetine, sertraline -certain medicines for fungal infections like ketoconazole and itraconazole -certain medicines for Parkinson's disease like benztropine, trihexyphenidyl -certain medicines for seizures like carbamazepine, phenobarbital, phenytoin, primidone -certain medicines for stomach problems like dicyclomine, hyoscyamine -certain medicines for travel sickness like scopolamine -general anesthetics like halothane, isoflurane, methoxyflurane, propofol -ipratropium -local anesthetics like lidocaine, pramoxine, tetracaine -MAOIs like Carbex, Eldepryl, Marplan, Nardil, and Parnate -medicines that relax muscles for surgery -other medicines with acetaminophen -other narcotic medicines for pain or cough -phenothiazines like chlorpromazine, mesoridazine, prochlorperazine, thioridazine -rifampin This list may not describe all possible interactions. Give your health care provider a list of all the medicines, herbs, non-prescription drugs, or dietary supplements you use. Also  tell them if you smoke, drink alcohol, or use illegal drugs. Some items may interact with your medicine. What should I watch for  while using this medicine? Tell your doctor or health care professional if your pain does not go away, if it gets worse, or if you have new or a different type of pain. You may develop tolerance to the medicine. Tolerance means that you will need a higher dose of the medicine for pain relief. Tolerance is normal and is expected if you take the medicine for a long time. Do not suddenly stop taking your medicine because you may develop a severe reaction. Your body becomes used to the medicine. This does NOT mean you are addicted. Addiction is a behavior related to getting and using a drug for a non-medical reason. If you have pain, you have a medical reason to take pain medicine. Your doctor will tell you how much medicine to take. If your doctor wants you to stop the medicine, the dose will be slowly lowered over time to avoid any side effects. There are different types of narcotic medicines (opiates). If you take more than one type at the same time or if you are taking another medicine that also causes drowsiness, you may have more side effects. Give your health care provider a list of all medicines you use. Your doctor will tell you how much medicine to take. Do not take more medicine than directed. Call emergency for help if you have problems breathing or unusual sleepiness. Do not take other medicines that contain acetaminophen with this medicine. Always read labels carefully. If you have questions, ask your doctor or pharmacist. If you take too much acetaminophen get medical help right away. Too much acetaminophen can be very dangerous and cause liver damage. Even if you do not have symptoms, it is important to get help right away. You may get drowsy or dizzy. Do not drive, use machinery, or do anything that needs mental alertness until you know how this medicine affects you. Do  not stand or sit up quickly, especially if you are an older patient. This reduces the risk of dizzy or fainting spells. Alcohol may interfere with the effect of this medicine. Avoid alcoholic drinks. The medicine will cause constipation. Try to have a bowel movement at least every 2 to 3 days. If you do not have a bowel movement for 3 days, call your doctor or health care professional. Your mouth may get dry. Chewing sugarless gum or sucking hard candy, and drinking plenty of water may help. Contact your doctor if the problem does not go away or is severe. What side effects may I notice from receiving this medicine? Side effects that you should report to your doctor or health care professional as soon as possible: -allergic reactions like skin rash, itching or hives, swelling of the face, lips, or tongue -breathing problems -confusion -redness, blistering, peeling or loosening of the skin, including inside the mouth -signs and symptoms of low blood pressure like dizziness; feeling faint or lightheaded, falls; unusually weak or tired -trouble passing urine or change in the amount of urine -yellowing of the eyes or skin Side effects that usually do not require medical attention (report to your doctor or health care professional if they continue or are bothersome): -constipation -dry mouth -nausea, vomiting -tiredness This list may not describe all possible side effects. Call your doctor for medical advice about side effects. You may report side effects to FDA at 1-800-FDA-1088. Where should I keep my medicine? Keep out of the reach of children. This medicine can be abused. Keep your medicine in a safe place to protect  it from theft. Do not share this medicine with anyone. Selling or giving away this medicine is dangerous and against the law. This medicine may cause accidental overdose and death if it taken by other adults, children, or pets. Mix any unused medicine with a substance like cat litter  or coffee grounds. Then throw the medicine away in a sealed container like a sealed bag or a coffee can with a lid. Do not use the medicine after the expiration date. Store at room temperature between 15 and 30 degrees C (59 and 86 degrees F). NOTE: This sheet is a summary. It may not cover all possible information. If you have questions about this medicine, talk to your doctor, pharmacist, or health care provider.  2018 Elsevier/Gold Standard (2015-05-01 10:02:16)   In general please monitor for signs of worsening symptoms and seek immediate medical attention if any concerning.    I hope you get better soon!

## 2017-08-31 DIAGNOSIS — E1169 Type 2 diabetes mellitus with other specified complication: Secondary | ICD-10-CM | POA: Diagnosis not present

## 2017-08-31 DIAGNOSIS — F0280 Dementia in other diseases classified elsewhere without behavioral disturbance: Secondary | ICD-10-CM | POA: Diagnosis not present

## 2017-08-31 DIAGNOSIS — I509 Heart failure, unspecified: Secondary | ICD-10-CM | POA: Diagnosis not present

## 2017-08-31 DIAGNOSIS — N183 Chronic kidney disease, stage 3 (moderate): Secondary | ICD-10-CM | POA: Diagnosis not present

## 2017-08-31 DIAGNOSIS — G309 Alzheimer's disease, unspecified: Secondary | ICD-10-CM | POA: Diagnosis not present

## 2017-08-31 DIAGNOSIS — T8189XA Other complications of procedures, not elsewhere classified, initial encounter: Secondary | ICD-10-CM | POA: Diagnosis not present

## 2017-08-31 DIAGNOSIS — L0502 Pilonidal sinus with abscess: Secondary | ICD-10-CM | POA: Diagnosis not present

## 2017-08-31 DIAGNOSIS — E1129 Type 2 diabetes mellitus with other diabetic kidney complication: Secondary | ICD-10-CM | POA: Diagnosis not present

## 2017-08-31 DIAGNOSIS — L0501 Pilonidal cyst with abscess: Secondary | ICD-10-CM | POA: Diagnosis not present

## 2017-08-31 DIAGNOSIS — M4628 Osteomyelitis of vertebra, sacral and sacrococcygeal region: Secondary | ICD-10-CM | POA: Diagnosis not present

## 2017-08-31 DIAGNOSIS — I130 Hypertensive heart and chronic kidney disease with heart failure and stage 1 through stage 4 chronic kidney disease, or unspecified chronic kidney disease: Secondary | ICD-10-CM | POA: Diagnosis not present

## 2017-08-31 DIAGNOSIS — E1122 Type 2 diabetes mellitus with diabetic chronic kidney disease: Secondary | ICD-10-CM | POA: Diagnosis not present

## 2017-09-04 DIAGNOSIS — M4628 Osteomyelitis of vertebra, sacral and sacrococcygeal region: Secondary | ICD-10-CM | POA: Diagnosis not present

## 2017-09-04 DIAGNOSIS — E1169 Type 2 diabetes mellitus with other specified complication: Secondary | ICD-10-CM | POA: Diagnosis not present

## 2017-09-04 DIAGNOSIS — G309 Alzheimer's disease, unspecified: Secondary | ICD-10-CM | POA: Diagnosis not present

## 2017-09-04 DIAGNOSIS — F0280 Dementia in other diseases classified elsewhere without behavioral disturbance: Secondary | ICD-10-CM | POA: Diagnosis not present

## 2017-09-04 DIAGNOSIS — E1122 Type 2 diabetes mellitus with diabetic chronic kidney disease: Secondary | ICD-10-CM | POA: Diagnosis not present

## 2017-09-04 DIAGNOSIS — L0501 Pilonidal cyst with abscess: Secondary | ICD-10-CM | POA: Diagnosis not present

## 2017-09-04 DIAGNOSIS — N183 Chronic kidney disease, stage 3 (moderate): Secondary | ICD-10-CM | POA: Diagnosis not present

## 2017-09-04 DIAGNOSIS — I509 Heart failure, unspecified: Secondary | ICD-10-CM | POA: Diagnosis not present

## 2017-09-04 DIAGNOSIS — I130 Hypertensive heart and chronic kidney disease with heart failure and stage 1 through stage 4 chronic kidney disease, or unspecified chronic kidney disease: Secondary | ICD-10-CM | POA: Diagnosis not present

## 2017-09-05 ENCOUNTER — Telehealth: Payer: Self-pay | Admitting: Adult Health

## 2017-09-05 DIAGNOSIS — G309 Alzheimer's disease, unspecified: Secondary | ICD-10-CM | POA: Diagnosis not present

## 2017-09-05 DIAGNOSIS — E1122 Type 2 diabetes mellitus with diabetic chronic kidney disease: Secondary | ICD-10-CM | POA: Diagnosis not present

## 2017-09-05 DIAGNOSIS — F0280 Dementia in other diseases classified elsewhere without behavioral disturbance: Secondary | ICD-10-CM | POA: Diagnosis not present

## 2017-09-05 DIAGNOSIS — M4628 Osteomyelitis of vertebra, sacral and sacrococcygeal region: Secondary | ICD-10-CM | POA: Diagnosis not present

## 2017-09-05 DIAGNOSIS — L0501 Pilonidal cyst with abscess: Secondary | ICD-10-CM | POA: Diagnosis not present

## 2017-09-05 DIAGNOSIS — I509 Heart failure, unspecified: Secondary | ICD-10-CM | POA: Diagnosis not present

## 2017-09-05 DIAGNOSIS — L0502 Pilonidal sinus with abscess: Secondary | ICD-10-CM | POA: Diagnosis not present

## 2017-09-05 DIAGNOSIS — E1129 Type 2 diabetes mellitus with other diabetic kidney complication: Secondary | ICD-10-CM | POA: Diagnosis not present

## 2017-09-05 DIAGNOSIS — N183 Chronic kidney disease, stage 3 (moderate): Secondary | ICD-10-CM | POA: Diagnosis not present

## 2017-09-05 DIAGNOSIS — E1169 Type 2 diabetes mellitus with other specified complication: Secondary | ICD-10-CM | POA: Diagnosis not present

## 2017-09-05 DIAGNOSIS — T8189XA Other complications of procedures, not elsewhere classified, initial encounter: Secondary | ICD-10-CM | POA: Diagnosis not present

## 2017-09-05 DIAGNOSIS — I130 Hypertensive heart and chronic kidney disease with heart failure and stage 1 through stage 4 chronic kidney disease, or unspecified chronic kidney disease: Secondary | ICD-10-CM | POA: Diagnosis not present

## 2017-09-05 NOTE — Telephone Encounter (Signed)
Left a message on identified voicemail informing Claiborne Billings to proceed with orders per Tommi Rumps.  Call back if needed.  No further action required.  Will close note.

## 2017-09-05 NOTE — Telephone Encounter (Signed)
Copied from [place] [phone]. Topic: Inquiry >> Sep 05, 2017  2:49 PM Boyd Kerbs wrote: Tommi Rumps, nurse called and gave verbal on order but Dr. Elease Hashimoto would not sign off on the order.  Please clarify  Please call Curt Bears [facility] [place] [phone]

## 2017-09-05 NOTE — Telephone Encounter (Signed)
Ok for verbal orders ?

## 2017-09-05 NOTE — Telephone Encounter (Signed)
Ok to give verbal orders?

## 2017-09-05 NOTE — Telephone Encounter (Signed)
Copied from [place] [phone]. Topic: Quick Communication - See Telephone Encounter >> Sep 05, 2017  2:53 PM Boyd Kerbs wrote: CRM for notification. See Telephone encounter for:   Curt Bears [facility] [phone] asking for verbal order to continue [place] for 1 x 5 weeks   09/05/17.

## 2017-09-05 NOTE — Telephone Encounter (Signed)
Copied from [place] [phone]. Topic: Inquiry >> Sep 05, 2017  2:00 PM Conception Chancy, NT wrote: Reason for CRM: Claiborne Billings is calling to get verbal orders for home physical therapy for twice a week for 4 weeks  Claiborne Billings [phone]

## 2017-09-06 DIAGNOSIS — M4628 Osteomyelitis of vertebra, sacral and sacrococcygeal region: Secondary | ICD-10-CM | POA: Diagnosis not present

## 2017-09-06 DIAGNOSIS — N183 Chronic kidney disease, stage 3 (moderate): Secondary | ICD-10-CM | POA: Diagnosis not present

## 2017-09-06 DIAGNOSIS — F0280 Dementia in other diseases classified elsewhere without behavioral disturbance: Secondary | ICD-10-CM | POA: Diagnosis not present

## 2017-09-06 DIAGNOSIS — I509 Heart failure, unspecified: Secondary | ICD-10-CM | POA: Diagnosis not present

## 2017-09-06 DIAGNOSIS — L0501 Pilonidal cyst with abscess: Secondary | ICD-10-CM | POA: Diagnosis not present

## 2017-09-06 DIAGNOSIS — E1169 Type 2 diabetes mellitus with other specified complication: Secondary | ICD-10-CM | POA: Diagnosis not present

## 2017-09-06 DIAGNOSIS — G309 Alzheimer's disease, unspecified: Secondary | ICD-10-CM | POA: Diagnosis not present

## 2017-09-06 DIAGNOSIS — I130 Hypertensive heart and chronic kidney disease with heart failure and stage 1 through stage 4 chronic kidney disease, or unspecified chronic kidney disease: Secondary | ICD-10-CM | POA: Diagnosis not present

## 2017-09-06 DIAGNOSIS — E1122 Type 2 diabetes mellitus with diabetic chronic kidney disease: Secondary | ICD-10-CM | POA: Diagnosis not present

## 2017-09-06 NOTE — Telephone Encounter (Signed)
Verbal orders called to Curt Bears and left on voicemail.

## 2017-09-07 DIAGNOSIS — F0280 Dementia in other diseases classified elsewhere without behavioral disturbance: Secondary | ICD-10-CM | POA: Diagnosis not present

## 2017-09-07 DIAGNOSIS — E1169 Type 2 diabetes mellitus with other specified complication: Secondary | ICD-10-CM | POA: Diagnosis not present

## 2017-09-07 DIAGNOSIS — L0501 Pilonidal cyst with abscess: Secondary | ICD-10-CM | POA: Diagnosis not present

## 2017-09-07 DIAGNOSIS — G309 Alzheimer's disease, unspecified: Secondary | ICD-10-CM | POA: Diagnosis not present

## 2017-09-07 DIAGNOSIS — N183 Chronic kidney disease, stage 3 (moderate): Secondary | ICD-10-CM | POA: Diagnosis not present

## 2017-09-07 DIAGNOSIS — E1122 Type 2 diabetes mellitus with diabetic chronic kidney disease: Secondary | ICD-10-CM | POA: Diagnosis not present

## 2017-09-07 DIAGNOSIS — I509 Heart failure, unspecified: Secondary | ICD-10-CM | POA: Diagnosis not present

## 2017-09-07 DIAGNOSIS — I130 Hypertensive heart and chronic kidney disease with heart failure and stage 1 through stage 4 chronic kidney disease, or unspecified chronic kidney disease: Secondary | ICD-10-CM | POA: Diagnosis not present

## 2017-09-07 DIAGNOSIS — M4628 Osteomyelitis of vertebra, sacral and sacrococcygeal region: Secondary | ICD-10-CM | POA: Diagnosis not present

## 2017-09-08 DIAGNOSIS — E1122 Type 2 diabetes mellitus with diabetic chronic kidney disease: Secondary | ICD-10-CM | POA: Diagnosis not present

## 2017-09-08 DIAGNOSIS — F0280 Dementia in other diseases classified elsewhere without behavioral disturbance: Secondary | ICD-10-CM | POA: Diagnosis not present

## 2017-09-08 DIAGNOSIS — N183 Chronic kidney disease, stage 3 (moderate): Secondary | ICD-10-CM | POA: Diagnosis not present

## 2017-09-08 DIAGNOSIS — M4628 Osteomyelitis of vertebra, sacral and sacrococcygeal region: Secondary | ICD-10-CM | POA: Diagnosis not present

## 2017-09-08 DIAGNOSIS — G309 Alzheimer's disease, unspecified: Secondary | ICD-10-CM | POA: Diagnosis not present

## 2017-09-08 DIAGNOSIS — I130 Hypertensive heart and chronic kidney disease with heart failure and stage 1 through stage 4 chronic kidney disease, or unspecified chronic kidney disease: Secondary | ICD-10-CM | POA: Diagnosis not present

## 2017-09-08 DIAGNOSIS — E1169 Type 2 diabetes mellitus with other specified complication: Secondary | ICD-10-CM | POA: Diagnosis not present

## 2017-09-08 DIAGNOSIS — I509 Heart failure, unspecified: Secondary | ICD-10-CM | POA: Diagnosis not present

## 2017-09-08 DIAGNOSIS — L0501 Pilonidal cyst with abscess: Secondary | ICD-10-CM | POA: Diagnosis not present

## 2017-09-11 DIAGNOSIS — M4628 Osteomyelitis of vertebra, sacral and sacrococcygeal region: Secondary | ICD-10-CM | POA: Diagnosis not present

## 2017-09-11 DIAGNOSIS — N183 Chronic kidney disease, stage 3 (moderate): Secondary | ICD-10-CM | POA: Diagnosis not present

## 2017-09-11 DIAGNOSIS — F0280 Dementia in other diseases classified elsewhere without behavioral disturbance: Secondary | ICD-10-CM | POA: Diagnosis not present

## 2017-09-11 DIAGNOSIS — I130 Hypertensive heart and chronic kidney disease with heart failure and stage 1 through stage 4 chronic kidney disease, or unspecified chronic kidney disease: Secondary | ICD-10-CM | POA: Diagnosis not present

## 2017-09-11 DIAGNOSIS — G309 Alzheimer's disease, unspecified: Secondary | ICD-10-CM | POA: Diagnosis not present

## 2017-09-11 DIAGNOSIS — L0501 Pilonidal cyst with abscess: Secondary | ICD-10-CM | POA: Diagnosis not present

## 2017-09-11 DIAGNOSIS — E1122 Type 2 diabetes mellitus with diabetic chronic kidney disease: Secondary | ICD-10-CM | POA: Diagnosis not present

## 2017-09-11 DIAGNOSIS — E1169 Type 2 diabetes mellitus with other specified complication: Secondary | ICD-10-CM | POA: Diagnosis not present

## 2017-09-11 DIAGNOSIS — I509 Heart failure, unspecified: Secondary | ICD-10-CM | POA: Diagnosis not present

## 2017-09-12 ENCOUNTER — Telehealth: Payer: Self-pay | Admitting: *Deleted

## 2017-09-12 ENCOUNTER — Ambulatory Visit (INDEPENDENT_AMBULATORY_CARE_PROVIDER_SITE_OTHER): Payer: Medicare HMO | Admitting: Infectious Diseases

## 2017-09-12 DIAGNOSIS — A1801 Tuberculosis of spine: Secondary | ICD-10-CM | POA: Diagnosis not present

## 2017-09-12 DIAGNOSIS — M4628 Osteomyelitis of vertebra, sacral and sacrococcygeal region: Secondary | ICD-10-CM

## 2017-09-12 NOTE — Progress Notes (Signed)
   Subjective:    Patient ID: Anna [name], female    DOB: 1929-02-23, 84 y.o.   MRN: [id number]  HPI 84 yo F with hx of alzheimer's, who was hospitalized 11-18 to 07-17-17 with coccygeal abscess. This grew E faecalis in a wound Cx and Strep on a bone bx. She was treated with 2 weeks of unasyn to be followed by 2 months of augmentin (started ~ 07-31-17).   She had ID f/u on 12-17 and was continued on her augmentin with a plan for 6 additional weeks.   She has had f/u with her PCP (rx'ing pain prescriptions).  No problems with anbx- normal BM (occas constipation), taking probiotic. No yeast infections.  Per her home health RN she is getting new skin growing. She has wound VAC.  Has wound apt 09-13-17.    Review of Systems  Unable to perform ROS: Dementia  Constitutional: Negative for chills and fever.  Gastrointestinal: Positive for constipation. Negative for diarrhea.  "I think she has lost a little bit of weight". Eating well.  Still having pain, worse with moving into wheelchair.  Has home PT.  Has hospital bed at home. Airflow mattress. Has coccyx pad on her wheelchair.  Please see HPI. All other systems reviewed and negative.      Objective:   Physical Exam  Constitutional: She appears well-developed and well-nourished. No distress.  Skin:             Assessment & Plan:

## 2017-09-12 NOTE — Telephone Encounter (Signed)
Spoke to [place].  Needs a refill of HYDROcodone-acetaminophen (NORCO) 5-325 MG tablet.  Will be out of medication on Saturday.  Was seen by Dr. Martinique on 08/30/17.  Please advise.

## 2017-09-12 NOTE — Telephone Encounter (Signed)
Copied from [place]. Topic: Quick Communication - See Telephone Encounter >> Sep 12, 2017 11:36 AM Antonieta Iba C wrote: CRM for notification. See Telephone encounter for: pt's daughter [name] called in to request a call back at: [phone] from PCP's assistant. She said that she received a vm from her requesting that she call back. ALSO, home health nurse Claiborne Billings called in to make provider aware that pt had her Vac changed today, so pt declined home health services for today. [phone])    09/12/17.

## 2017-09-12 NOTE — Assessment & Plan Note (Signed)
She is doing well She has 2 more weeks of anbx remaining.  Will complete those F/u at [place] (esp re: fxn of VAC, pressure setting on airflow bed (soft?)).  Will see her back in 6 weeks, off anbx.

## 2017-09-13 ENCOUNTER — Other Ambulatory Visit: Payer: Self-pay | Admitting: Adult Health

## 2017-09-13 DIAGNOSIS — E11622 Type 2 diabetes mellitus with other skin ulcer: Secondary | ICD-10-CM | POA: Diagnosis not present

## 2017-09-13 DIAGNOSIS — L98496 Non-pressure chronic ulcer of skin of other sites with bone involvement without evidence of necrosis: Secondary | ICD-10-CM | POA: Diagnosis not present

## 2017-09-13 DIAGNOSIS — N183 Chronic kidney disease, stage 3 (moderate): Secondary | ICD-10-CM | POA: Diagnosis not present

## 2017-09-13 DIAGNOSIS — E1122 Type 2 diabetes mellitus with diabetic chronic kidney disease: Secondary | ICD-10-CM | POA: Diagnosis not present

## 2017-09-13 DIAGNOSIS — M4628 Osteomyelitis of vertebra, sacral and sacrococcygeal region: Secondary | ICD-10-CM

## 2017-09-13 DIAGNOSIS — I5032 Chronic diastolic (congestive) heart failure: Secondary | ICD-10-CM | POA: Diagnosis not present

## 2017-09-13 DIAGNOSIS — G309 Alzheimer's disease, unspecified: Secondary | ICD-10-CM | POA: Diagnosis not present

## 2017-09-13 DIAGNOSIS — L98426 Non-pressure chronic ulcer of back with bone involvement without evidence of necrosis: Secondary | ICD-10-CM | POA: Diagnosis not present

## 2017-09-13 DIAGNOSIS — R627 Adult failure to thrive: Secondary | ICD-10-CM | POA: Diagnosis not present

## 2017-09-13 DIAGNOSIS — L0501 Pilonidal cyst with abscess: Secondary | ICD-10-CM | POA: Diagnosis not present

## 2017-09-13 DIAGNOSIS — I4891 Unspecified atrial fibrillation: Secondary | ICD-10-CM | POA: Diagnosis not present

## 2017-09-13 MED ORDER — HYDROCODONE-ACETAMINOPHEN 5-325 MG PO TABS: 1.0000 | ORAL_TABLET | Freq: Three times a day (TID) | ORAL | 0 refills | Status: DC | PRN

## 2017-09-14 ENCOUNTER — Telehealth: Payer: Self-pay | Admitting: Adult Health

## 2017-09-14 ENCOUNTER — Ambulatory Visit: Payer: Medicare HMO | Admitting: Infectious Diseases

## 2017-09-14 DIAGNOSIS — N183 Chronic kidney disease, stage 3 (moderate): Secondary | ICD-10-CM | POA: Diagnosis not present

## 2017-09-14 DIAGNOSIS — E1169 Type 2 diabetes mellitus with other specified complication: Secondary | ICD-10-CM | POA: Diagnosis not present

## 2017-09-14 DIAGNOSIS — F0280 Dementia in other diseases classified elsewhere without behavioral disturbance: Secondary | ICD-10-CM | POA: Diagnosis not present

## 2017-09-14 DIAGNOSIS — G309 Alzheimer's disease, unspecified: Secondary | ICD-10-CM | POA: Diagnosis not present

## 2017-09-14 DIAGNOSIS — E1122 Type 2 diabetes mellitus with diabetic chronic kidney disease: Secondary | ICD-10-CM | POA: Diagnosis not present

## 2017-09-14 DIAGNOSIS — L0501 Pilonidal cyst with abscess: Secondary | ICD-10-CM | POA: Diagnosis not present

## 2017-09-14 DIAGNOSIS — I130 Hypertensive heart and chronic kidney disease with heart failure and stage 1 through stage 4 chronic kidney disease, or unspecified chronic kidney disease: Secondary | ICD-10-CM | POA: Diagnosis not present

## 2017-09-14 DIAGNOSIS — M4628 Osteomyelitis of vertebra, sacral and sacrococcygeal region: Secondary | ICD-10-CM | POA: Diagnosis not present

## 2017-09-14 DIAGNOSIS — I509 Heart failure, unspecified: Secondary | ICD-10-CM | POA: Diagnosis not present

## 2017-09-14 NOTE — Telephone Encounter (Signed)
Copied from [place] [id number]. Topic: Quick Communication - See Telephone Encounter >> Sep 14, 2017  2:21 PM Hewitt Shorts wrote: CRM for notification. See Telephone encounter for:  [name] with [facility] is needing to get a verbal order to have continued home health pt for 1-2 week for 4 weeks  Best number for [facility] [phone] 09/14/17.

## 2017-09-14 NOTE — Telephone Encounter (Signed)
Ok for verbal orders ?

## 2017-09-15 DIAGNOSIS — I130 Hypertensive heart and chronic kidney disease with heart failure and stage 1 through stage 4 chronic kidney disease, or unspecified chronic kidney disease: Secondary | ICD-10-CM | POA: Diagnosis not present

## 2017-09-15 DIAGNOSIS — N183 Chronic kidney disease, stage 3 (moderate): Secondary | ICD-10-CM | POA: Diagnosis not present

## 2017-09-15 DIAGNOSIS — L0501 Pilonidal cyst with abscess: Secondary | ICD-10-CM | POA: Diagnosis not present

## 2017-09-15 DIAGNOSIS — G309 Alzheimer's disease, unspecified: Secondary | ICD-10-CM | POA: Diagnosis not present

## 2017-09-15 DIAGNOSIS — E1169 Type 2 diabetes mellitus with other specified complication: Secondary | ICD-10-CM | POA: Diagnosis not present

## 2017-09-15 DIAGNOSIS — E1122 Type 2 diabetes mellitus with diabetic chronic kidney disease: Secondary | ICD-10-CM | POA: Diagnosis not present

## 2017-09-15 DIAGNOSIS — M4628 Osteomyelitis of vertebra, sacral and sacrococcygeal region: Secondary | ICD-10-CM | POA: Diagnosis not present

## 2017-09-15 DIAGNOSIS — F0280 Dementia in other diseases classified elsewhere without behavioral disturbance: Secondary | ICD-10-CM | POA: Diagnosis not present

## 2017-09-15 DIAGNOSIS — I509 Heart failure, unspecified: Secondary | ICD-10-CM | POA: Diagnosis not present

## 2017-09-15 NOTE — Telephone Encounter (Signed)
Left a message informing Claiborne Billings to proceed with orders.  Call back if needed.  No further action required.  Will close the note.

## 2017-09-16 DIAGNOSIS — E1129 Type 2 diabetes mellitus with other diabetic kidney complication: Secondary | ICD-10-CM | POA: Diagnosis not present

## 2017-09-16 DIAGNOSIS — I5033 Acute on chronic diastolic (congestive) heart failure: Secondary | ICD-10-CM | POA: Diagnosis not present

## 2017-09-16 DIAGNOSIS — G309 Alzheimer's disease, unspecified: Secondary | ICD-10-CM | POA: Diagnosis not present

## 2017-09-16 DIAGNOSIS — R0602 Shortness of breath: Secondary | ICD-10-CM | POA: Diagnosis not present

## 2017-09-18 DIAGNOSIS — G309 Alzheimer's disease, unspecified: Secondary | ICD-10-CM | POA: Diagnosis not present

## 2017-09-18 DIAGNOSIS — L0501 Pilonidal cyst with abscess: Secondary | ICD-10-CM | POA: Diagnosis not present

## 2017-09-18 DIAGNOSIS — I130 Hypertensive heart and chronic kidney disease with heart failure and stage 1 through stage 4 chronic kidney disease, or unspecified chronic kidney disease: Secondary | ICD-10-CM | POA: Diagnosis not present

## 2017-09-18 DIAGNOSIS — N183 Chronic kidney disease, stage 3 (moderate): Secondary | ICD-10-CM | POA: Diagnosis not present

## 2017-09-18 DIAGNOSIS — E1122 Type 2 diabetes mellitus with diabetic chronic kidney disease: Secondary | ICD-10-CM | POA: Diagnosis not present

## 2017-09-18 DIAGNOSIS — E1169 Type 2 diabetes mellitus with other specified complication: Secondary | ICD-10-CM | POA: Diagnosis not present

## 2017-09-18 DIAGNOSIS — M4628 Osteomyelitis of vertebra, sacral and sacrococcygeal region: Secondary | ICD-10-CM | POA: Diagnosis not present

## 2017-09-18 DIAGNOSIS — I509 Heart failure, unspecified: Secondary | ICD-10-CM | POA: Diagnosis not present

## 2017-09-18 DIAGNOSIS — F0280 Dementia in other diseases classified elsewhere without behavioral disturbance: Secondary | ICD-10-CM | POA: Diagnosis not present

## 2017-09-19 DIAGNOSIS — L0501 Pilonidal cyst with abscess: Secondary | ICD-10-CM | POA: Diagnosis not present

## 2017-09-19 DIAGNOSIS — G309 Alzheimer's disease, unspecified: Secondary | ICD-10-CM | POA: Diagnosis not present

## 2017-09-19 DIAGNOSIS — E1122 Type 2 diabetes mellitus with diabetic chronic kidney disease: Secondary | ICD-10-CM | POA: Diagnosis not present

## 2017-09-19 DIAGNOSIS — I509 Heart failure, unspecified: Secondary | ICD-10-CM | POA: Diagnosis not present

## 2017-09-19 DIAGNOSIS — E1169 Type 2 diabetes mellitus with other specified complication: Secondary | ICD-10-CM | POA: Diagnosis not present

## 2017-09-19 DIAGNOSIS — F0280 Dementia in other diseases classified elsewhere without behavioral disturbance: Secondary | ICD-10-CM | POA: Diagnosis not present

## 2017-09-19 DIAGNOSIS — I130 Hypertensive heart and chronic kidney disease with heart failure and stage 1 through stage 4 chronic kidney disease, or unspecified chronic kidney disease: Secondary | ICD-10-CM | POA: Diagnosis not present

## 2017-09-19 DIAGNOSIS — N183 Chronic kidney disease, stage 3 (moderate): Secondary | ICD-10-CM | POA: Diagnosis not present

## 2017-09-19 DIAGNOSIS — M4628 Osteomyelitis of vertebra, sacral and sacrococcygeal region: Secondary | ICD-10-CM | POA: Diagnosis not present

## 2017-09-20 DIAGNOSIS — M4628 Osteomyelitis of vertebra, sacral and sacrococcygeal region: Secondary | ICD-10-CM | POA: Diagnosis not present

## 2017-09-20 DIAGNOSIS — E1122 Type 2 diabetes mellitus with diabetic chronic kidney disease: Secondary | ICD-10-CM | POA: Diagnosis not present

## 2017-09-20 DIAGNOSIS — N183 Chronic kidney disease, stage 3 (moderate): Secondary | ICD-10-CM | POA: Diagnosis not present

## 2017-09-20 DIAGNOSIS — G309 Alzheimer's disease, unspecified: Secondary | ICD-10-CM | POA: Diagnosis not present

## 2017-09-20 DIAGNOSIS — E1169 Type 2 diabetes mellitus with other specified complication: Secondary | ICD-10-CM | POA: Diagnosis not present

## 2017-09-20 DIAGNOSIS — I130 Hypertensive heart and chronic kidney disease with heart failure and stage 1 through stage 4 chronic kidney disease, or unspecified chronic kidney disease: Secondary | ICD-10-CM | POA: Diagnosis not present

## 2017-09-20 DIAGNOSIS — I509 Heart failure, unspecified: Secondary | ICD-10-CM | POA: Diagnosis not present

## 2017-09-20 DIAGNOSIS — F0280 Dementia in other diseases classified elsewhere without behavioral disturbance: Secondary | ICD-10-CM | POA: Diagnosis not present

## 2017-09-20 DIAGNOSIS — L0501 Pilonidal cyst with abscess: Secondary | ICD-10-CM | POA: Diagnosis not present

## 2017-09-21 DIAGNOSIS — G309 Alzheimer's disease, unspecified: Secondary | ICD-10-CM | POA: Diagnosis not present

## 2017-09-21 DIAGNOSIS — F0280 Dementia in other diseases classified elsewhere without behavioral disturbance: Secondary | ICD-10-CM | POA: Diagnosis not present

## 2017-09-21 DIAGNOSIS — M4628 Osteomyelitis of vertebra, sacral and sacrococcygeal region: Secondary | ICD-10-CM | POA: Diagnosis not present

## 2017-09-21 DIAGNOSIS — I130 Hypertensive heart and chronic kidney disease with heart failure and stage 1 through stage 4 chronic kidney disease, or unspecified chronic kidney disease: Secondary | ICD-10-CM | POA: Diagnosis not present

## 2017-09-21 DIAGNOSIS — E1122 Type 2 diabetes mellitus with diabetic chronic kidney disease: Secondary | ICD-10-CM | POA: Diagnosis not present

## 2017-09-21 DIAGNOSIS — E1169 Type 2 diabetes mellitus with other specified complication: Secondary | ICD-10-CM | POA: Diagnosis not present

## 2017-09-21 DIAGNOSIS — I509 Heart failure, unspecified: Secondary | ICD-10-CM | POA: Diagnosis not present

## 2017-09-21 DIAGNOSIS — L0501 Pilonidal cyst with abscess: Secondary | ICD-10-CM | POA: Diagnosis not present

## 2017-09-21 DIAGNOSIS — N183 Chronic kidney disease, stage 3 (moderate): Secondary | ICD-10-CM | POA: Diagnosis not present

## 2017-09-22 DIAGNOSIS — M4628 Osteomyelitis of vertebra, sacral and sacrococcygeal region: Secondary | ICD-10-CM | POA: Diagnosis not present

## 2017-09-22 DIAGNOSIS — G309 Alzheimer's disease, unspecified: Secondary | ICD-10-CM | POA: Diagnosis not present

## 2017-09-22 DIAGNOSIS — E1122 Type 2 diabetes mellitus with diabetic chronic kidney disease: Secondary | ICD-10-CM | POA: Diagnosis not present

## 2017-09-22 DIAGNOSIS — I509 Heart failure, unspecified: Secondary | ICD-10-CM | POA: Diagnosis not present

## 2017-09-22 DIAGNOSIS — I130 Hypertensive heart and chronic kidney disease with heart failure and stage 1 through stage 4 chronic kidney disease, or unspecified chronic kidney disease: Secondary | ICD-10-CM | POA: Diagnosis not present

## 2017-09-22 DIAGNOSIS — E1169 Type 2 diabetes mellitus with other specified complication: Secondary | ICD-10-CM | POA: Diagnosis not present

## 2017-09-22 DIAGNOSIS — N183 Chronic kidney disease, stage 3 (moderate): Secondary | ICD-10-CM | POA: Diagnosis not present

## 2017-09-22 DIAGNOSIS — F0280 Dementia in other diseases classified elsewhere without behavioral disturbance: Secondary | ICD-10-CM | POA: Diagnosis not present

## 2017-09-22 DIAGNOSIS — L0501 Pilonidal cyst with abscess: Secondary | ICD-10-CM | POA: Diagnosis not present

## 2017-09-25 DIAGNOSIS — I130 Hypertensive heart and chronic kidney disease with heart failure and stage 1 through stage 4 chronic kidney disease, or unspecified chronic kidney disease: Secondary | ICD-10-CM | POA: Diagnosis not present

## 2017-09-25 DIAGNOSIS — F0280 Dementia in other diseases classified elsewhere without behavioral disturbance: Secondary | ICD-10-CM | POA: Diagnosis not present

## 2017-09-25 DIAGNOSIS — E1169 Type 2 diabetes mellitus with other specified complication: Secondary | ICD-10-CM | POA: Diagnosis not present

## 2017-09-25 DIAGNOSIS — L0501 Pilonidal cyst with abscess: Secondary | ICD-10-CM | POA: Diagnosis not present

## 2017-09-25 DIAGNOSIS — N183 Chronic kidney disease, stage 3 (moderate): Secondary | ICD-10-CM | POA: Diagnosis not present

## 2017-09-25 DIAGNOSIS — G309 Alzheimer's disease, unspecified: Secondary | ICD-10-CM | POA: Diagnosis not present

## 2017-09-25 DIAGNOSIS — I509 Heart failure, unspecified: Secondary | ICD-10-CM | POA: Diagnosis not present

## 2017-09-25 DIAGNOSIS — M4628 Osteomyelitis of vertebra, sacral and sacrococcygeal region: Secondary | ICD-10-CM | POA: Diagnosis not present

## 2017-09-25 DIAGNOSIS — E1122 Type 2 diabetes mellitus with diabetic chronic kidney disease: Secondary | ICD-10-CM | POA: Diagnosis not present

## 2017-09-26 DIAGNOSIS — I509 Heart failure, unspecified: Secondary | ICD-10-CM | POA: Diagnosis not present

## 2017-09-26 DIAGNOSIS — N183 Chronic kidney disease, stage 3 (moderate): Secondary | ICD-10-CM | POA: Diagnosis not present

## 2017-09-26 DIAGNOSIS — F0280 Dementia in other diseases classified elsewhere without behavioral disturbance: Secondary | ICD-10-CM | POA: Diagnosis not present

## 2017-09-26 DIAGNOSIS — E1122 Type 2 diabetes mellitus with diabetic chronic kidney disease: Secondary | ICD-10-CM | POA: Diagnosis not present

## 2017-09-26 DIAGNOSIS — M4628 Osteomyelitis of vertebra, sacral and sacrococcygeal region: Secondary | ICD-10-CM | POA: Diagnosis not present

## 2017-09-26 DIAGNOSIS — L0501 Pilonidal cyst with abscess: Secondary | ICD-10-CM | POA: Diagnosis not present

## 2017-09-26 DIAGNOSIS — E1169 Type 2 diabetes mellitus with other specified complication: Secondary | ICD-10-CM | POA: Diagnosis not present

## 2017-09-26 DIAGNOSIS — I130 Hypertensive heart and chronic kidney disease with heart failure and stage 1 through stage 4 chronic kidney disease, or unspecified chronic kidney disease: Secondary | ICD-10-CM | POA: Diagnosis not present

## 2017-09-26 DIAGNOSIS — G309 Alzheimer's disease, unspecified: Secondary | ICD-10-CM | POA: Diagnosis not present

## 2017-09-27 DIAGNOSIS — F0280 Dementia in other diseases classified elsewhere without behavioral disturbance: Secondary | ICD-10-CM | POA: Diagnosis not present

## 2017-09-27 DIAGNOSIS — G309 Alzheimer's disease, unspecified: Secondary | ICD-10-CM | POA: Diagnosis not present

## 2017-09-27 DIAGNOSIS — L0501 Pilonidal cyst with abscess: Secondary | ICD-10-CM | POA: Diagnosis not present

## 2017-09-27 DIAGNOSIS — E1169 Type 2 diabetes mellitus with other specified complication: Secondary | ICD-10-CM | POA: Diagnosis not present

## 2017-09-27 DIAGNOSIS — N183 Chronic kidney disease, stage 3 (moderate): Secondary | ICD-10-CM | POA: Diagnosis not present

## 2017-09-27 DIAGNOSIS — I130 Hypertensive heart and chronic kidney disease with heart failure and stage 1 through stage 4 chronic kidney disease, or unspecified chronic kidney disease: Secondary | ICD-10-CM | POA: Diagnosis not present

## 2017-09-27 DIAGNOSIS — I509 Heart failure, unspecified: Secondary | ICD-10-CM | POA: Diagnosis not present

## 2017-09-27 DIAGNOSIS — E1122 Type 2 diabetes mellitus with diabetic chronic kidney disease: Secondary | ICD-10-CM | POA: Diagnosis not present

## 2017-09-27 DIAGNOSIS — M4628 Osteomyelitis of vertebra, sacral and sacrococcygeal region: Secondary | ICD-10-CM | POA: Diagnosis not present

## 2017-09-28 DIAGNOSIS — L0501 Pilonidal cyst with abscess: Secondary | ICD-10-CM | POA: Diagnosis not present

## 2017-09-28 DIAGNOSIS — E1129 Type 2 diabetes mellitus with other diabetic kidney complication: Secondary | ICD-10-CM | POA: Diagnosis not present

## 2017-09-28 DIAGNOSIS — M4628 Osteomyelitis of vertebra, sacral and sacrococcygeal region: Secondary | ICD-10-CM | POA: Diagnosis not present

## 2017-09-28 DIAGNOSIS — I130 Hypertensive heart and chronic kidney disease with heart failure and stage 1 through stage 4 chronic kidney disease, or unspecified chronic kidney disease: Secondary | ICD-10-CM | POA: Diagnosis not present

## 2017-09-28 DIAGNOSIS — G309 Alzheimer's disease, unspecified: Secondary | ICD-10-CM | POA: Diagnosis not present

## 2017-09-28 DIAGNOSIS — E1169 Type 2 diabetes mellitus with other specified complication: Secondary | ICD-10-CM | POA: Diagnosis not present

## 2017-09-28 DIAGNOSIS — E1122 Type 2 diabetes mellitus with diabetic chronic kidney disease: Secondary | ICD-10-CM | POA: Diagnosis not present

## 2017-09-28 DIAGNOSIS — I509 Heart failure, unspecified: Secondary | ICD-10-CM | POA: Diagnosis not present

## 2017-09-28 DIAGNOSIS — F0280 Dementia in other diseases classified elsewhere without behavioral disturbance: Secondary | ICD-10-CM | POA: Diagnosis not present

## 2017-09-28 DIAGNOSIS — N183 Chronic kidney disease, stage 3 (moderate): Secondary | ICD-10-CM | POA: Diagnosis not present

## 2017-09-28 DIAGNOSIS — L0502 Pilonidal sinus with abscess: Secondary | ICD-10-CM | POA: Diagnosis not present

## 2017-09-28 DIAGNOSIS — T8189XA Other complications of procedures, not elsewhere classified, initial encounter: Secondary | ICD-10-CM | POA: Diagnosis not present

## 2017-09-29 DIAGNOSIS — I509 Heart failure, unspecified: Secondary | ICD-10-CM | POA: Diagnosis not present

## 2017-09-29 DIAGNOSIS — N183 Chronic kidney disease, stage 3 (moderate): Secondary | ICD-10-CM | POA: Diagnosis not present

## 2017-09-29 DIAGNOSIS — E1122 Type 2 diabetes mellitus with diabetic chronic kidney disease: Secondary | ICD-10-CM | POA: Diagnosis not present

## 2017-09-29 DIAGNOSIS — M4628 Osteomyelitis of vertebra, sacral and sacrococcygeal region: Secondary | ICD-10-CM | POA: Diagnosis not present

## 2017-09-29 DIAGNOSIS — E1169 Type 2 diabetes mellitus with other specified complication: Secondary | ICD-10-CM | POA: Diagnosis not present

## 2017-09-29 DIAGNOSIS — L0501 Pilonidal cyst with abscess: Secondary | ICD-10-CM | POA: Diagnosis not present

## 2017-09-29 DIAGNOSIS — F0280 Dementia in other diseases classified elsewhere without behavioral disturbance: Secondary | ICD-10-CM | POA: Diagnosis not present

## 2017-09-29 DIAGNOSIS — G309 Alzheimer's disease, unspecified: Secondary | ICD-10-CM | POA: Diagnosis not present

## 2017-09-29 DIAGNOSIS — I130 Hypertensive heart and chronic kidney disease with heart failure and stage 1 through stage 4 chronic kidney disease, or unspecified chronic kidney disease: Secondary | ICD-10-CM | POA: Diagnosis not present

## 2017-10-01 DIAGNOSIS — L0501 Pilonidal cyst with abscess: Secondary | ICD-10-CM | POA: Diagnosis not present

## 2017-10-01 DIAGNOSIS — T8189XA Other complications of procedures, not elsewhere classified, initial encounter: Secondary | ICD-10-CM | POA: Diagnosis not present

## 2017-10-01 DIAGNOSIS — E1129 Type 2 diabetes mellitus with other diabetic kidney complication: Secondary | ICD-10-CM | POA: Diagnosis not present

## 2017-10-02 DIAGNOSIS — M4628 Osteomyelitis of vertebra, sacral and sacrococcygeal region: Secondary | ICD-10-CM | POA: Diagnosis not present

## 2017-10-02 DIAGNOSIS — E1169 Type 2 diabetes mellitus with other specified complication: Secondary | ICD-10-CM | POA: Diagnosis not present

## 2017-10-02 DIAGNOSIS — I130 Hypertensive heart and chronic kidney disease with heart failure and stage 1 through stage 4 chronic kidney disease, or unspecified chronic kidney disease: Secondary | ICD-10-CM | POA: Diagnosis not present

## 2017-10-02 DIAGNOSIS — I509 Heart failure, unspecified: Secondary | ICD-10-CM | POA: Diagnosis not present

## 2017-10-02 DIAGNOSIS — E1122 Type 2 diabetes mellitus with diabetic chronic kidney disease: Secondary | ICD-10-CM | POA: Diagnosis not present

## 2017-10-02 DIAGNOSIS — N183 Chronic kidney disease, stage 3 (moderate): Secondary | ICD-10-CM | POA: Diagnosis not present

## 2017-10-02 DIAGNOSIS — G309 Alzheimer's disease, unspecified: Secondary | ICD-10-CM | POA: Diagnosis not present

## 2017-10-02 DIAGNOSIS — L0501 Pilonidal cyst with abscess: Secondary | ICD-10-CM | POA: Diagnosis not present

## 2017-10-02 DIAGNOSIS — F0280 Dementia in other diseases classified elsewhere without behavioral disturbance: Secondary | ICD-10-CM | POA: Diagnosis not present

## 2017-10-03 DIAGNOSIS — I130 Hypertensive heart and chronic kidney disease with heart failure and stage 1 through stage 4 chronic kidney disease, or unspecified chronic kidney disease: Secondary | ICD-10-CM | POA: Diagnosis not present

## 2017-10-03 DIAGNOSIS — F0280 Dementia in other diseases classified elsewhere without behavioral disturbance: Secondary | ICD-10-CM | POA: Diagnosis not present

## 2017-10-03 DIAGNOSIS — L0501 Pilonidal cyst with abscess: Secondary | ICD-10-CM | POA: Diagnosis not present

## 2017-10-03 DIAGNOSIS — E1122 Type 2 diabetes mellitus with diabetic chronic kidney disease: Secondary | ICD-10-CM | POA: Diagnosis not present

## 2017-10-03 DIAGNOSIS — M4628 Osteomyelitis of vertebra, sacral and sacrococcygeal region: Secondary | ICD-10-CM | POA: Diagnosis not present

## 2017-10-03 DIAGNOSIS — N183 Chronic kidney disease, stage 3 (moderate): Secondary | ICD-10-CM | POA: Diagnosis not present

## 2017-10-03 DIAGNOSIS — I509 Heart failure, unspecified: Secondary | ICD-10-CM | POA: Diagnosis not present

## 2017-10-03 DIAGNOSIS — E1169 Type 2 diabetes mellitus with other specified complication: Secondary | ICD-10-CM | POA: Diagnosis not present

## 2017-10-03 DIAGNOSIS — G309 Alzheimer's disease, unspecified: Secondary | ICD-10-CM | POA: Diagnosis not present

## 2017-10-04 DIAGNOSIS — N183 Chronic kidney disease, stage 3 (moderate): Secondary | ICD-10-CM | POA: Diagnosis not present

## 2017-10-04 DIAGNOSIS — G309 Alzheimer's disease, unspecified: Secondary | ICD-10-CM | POA: Diagnosis not present

## 2017-10-04 DIAGNOSIS — L0501 Pilonidal cyst with abscess: Secondary | ICD-10-CM | POA: Diagnosis not present

## 2017-10-04 DIAGNOSIS — E1169 Type 2 diabetes mellitus with other specified complication: Secondary | ICD-10-CM | POA: Diagnosis not present

## 2017-10-04 DIAGNOSIS — M4628 Osteomyelitis of vertebra, sacral and sacrococcygeal region: Secondary | ICD-10-CM | POA: Diagnosis not present

## 2017-10-04 DIAGNOSIS — E1122 Type 2 diabetes mellitus with diabetic chronic kidney disease: Secondary | ICD-10-CM | POA: Diagnosis not present

## 2017-10-04 DIAGNOSIS — I509 Heart failure, unspecified: Secondary | ICD-10-CM | POA: Diagnosis not present

## 2017-10-04 DIAGNOSIS — F0280 Dementia in other diseases classified elsewhere without behavioral disturbance: Secondary | ICD-10-CM | POA: Diagnosis not present

## 2017-10-04 DIAGNOSIS — I130 Hypertensive heart and chronic kidney disease with heart failure and stage 1 through stage 4 chronic kidney disease, or unspecified chronic kidney disease: Secondary | ICD-10-CM | POA: Diagnosis not present

## 2017-10-05 DIAGNOSIS — L0501 Pilonidal cyst with abscess: Secondary | ICD-10-CM | POA: Diagnosis not present

## 2017-10-05 DIAGNOSIS — I130 Hypertensive heart and chronic kidney disease with heart failure and stage 1 through stage 4 chronic kidney disease, or unspecified chronic kidney disease: Secondary | ICD-10-CM | POA: Diagnosis not present

## 2017-10-05 DIAGNOSIS — I509 Heart failure, unspecified: Secondary | ICD-10-CM | POA: Diagnosis not present

## 2017-10-05 DIAGNOSIS — M4628 Osteomyelitis of vertebra, sacral and sacrococcygeal region: Secondary | ICD-10-CM | POA: Diagnosis not present

## 2017-10-05 DIAGNOSIS — E1169 Type 2 diabetes mellitus with other specified complication: Secondary | ICD-10-CM | POA: Diagnosis not present

## 2017-10-05 DIAGNOSIS — F0280 Dementia in other diseases classified elsewhere without behavioral disturbance: Secondary | ICD-10-CM | POA: Diagnosis not present

## 2017-10-05 DIAGNOSIS — G309 Alzheimer's disease, unspecified: Secondary | ICD-10-CM | POA: Diagnosis not present

## 2017-10-05 DIAGNOSIS — E1122 Type 2 diabetes mellitus with diabetic chronic kidney disease: Secondary | ICD-10-CM | POA: Diagnosis not present

## 2017-10-05 DIAGNOSIS — N183 Chronic kidney disease, stage 3 (moderate): Secondary | ICD-10-CM | POA: Diagnosis not present

## 2017-10-07 DIAGNOSIS — E1169 Type 2 diabetes mellitus with other specified complication: Secondary | ICD-10-CM | POA: Diagnosis not present

## 2017-10-07 DIAGNOSIS — I130 Hypertensive heart and chronic kidney disease with heart failure and stage 1 through stage 4 chronic kidney disease, or unspecified chronic kidney disease: Secondary | ICD-10-CM | POA: Diagnosis not present

## 2017-10-07 DIAGNOSIS — M4628 Osteomyelitis of vertebra, sacral and sacrococcygeal region: Secondary | ICD-10-CM | POA: Diagnosis not present

## 2017-10-07 DIAGNOSIS — L0501 Pilonidal cyst with abscess: Secondary | ICD-10-CM | POA: Diagnosis not present

## 2017-10-07 DIAGNOSIS — E1122 Type 2 diabetes mellitus with diabetic chronic kidney disease: Secondary | ICD-10-CM | POA: Diagnosis not present

## 2017-10-07 DIAGNOSIS — G309 Alzheimer's disease, unspecified: Secondary | ICD-10-CM | POA: Diagnosis not present

## 2017-10-07 DIAGNOSIS — I509 Heart failure, unspecified: Secondary | ICD-10-CM | POA: Diagnosis not present

## 2017-10-07 DIAGNOSIS — F0280 Dementia in other diseases classified elsewhere without behavioral disturbance: Secondary | ICD-10-CM | POA: Diagnosis not present

## 2017-10-07 DIAGNOSIS — N183 Chronic kidney disease, stage 3 (moderate): Secondary | ICD-10-CM | POA: Diagnosis not present

## 2017-10-09 DIAGNOSIS — F0280 Dementia in other diseases classified elsewhere without behavioral disturbance: Secondary | ICD-10-CM | POA: Diagnosis not present

## 2017-10-09 DIAGNOSIS — E1169 Type 2 diabetes mellitus with other specified complication: Secondary | ICD-10-CM | POA: Diagnosis not present

## 2017-10-09 DIAGNOSIS — I130 Hypertensive heart and chronic kidney disease with heart failure and stage 1 through stage 4 chronic kidney disease, or unspecified chronic kidney disease: Secondary | ICD-10-CM | POA: Diagnosis not present

## 2017-10-09 DIAGNOSIS — G309 Alzheimer's disease, unspecified: Secondary | ICD-10-CM | POA: Diagnosis not present

## 2017-10-09 DIAGNOSIS — L0501 Pilonidal cyst with abscess: Secondary | ICD-10-CM | POA: Diagnosis not present

## 2017-10-09 DIAGNOSIS — N183 Chronic kidney disease, stage 3 (moderate): Secondary | ICD-10-CM | POA: Diagnosis not present

## 2017-10-09 DIAGNOSIS — I509 Heart failure, unspecified: Secondary | ICD-10-CM | POA: Diagnosis not present

## 2017-10-09 DIAGNOSIS — M4628 Osteomyelitis of vertebra, sacral and sacrococcygeal region: Secondary | ICD-10-CM | POA: Diagnosis not present

## 2017-10-09 DIAGNOSIS — E1122 Type 2 diabetes mellitus with diabetic chronic kidney disease: Secondary | ICD-10-CM | POA: Diagnosis not present

## 2017-10-10 ENCOUNTER — Telehealth: Payer: Self-pay | Admitting: Adult Health

## 2017-10-10 DIAGNOSIS — I130 Hypertensive heart and chronic kidney disease with heart failure and stage 1 through stage 4 chronic kidney disease, or unspecified chronic kidney disease: Secondary | ICD-10-CM | POA: Diagnosis not present

## 2017-10-10 DIAGNOSIS — L0501 Pilonidal cyst with abscess: Secondary | ICD-10-CM | POA: Diagnosis not present

## 2017-10-10 DIAGNOSIS — I509 Heart failure, unspecified: Secondary | ICD-10-CM | POA: Diagnosis not present

## 2017-10-10 DIAGNOSIS — E1122 Type 2 diabetes mellitus with diabetic chronic kidney disease: Secondary | ICD-10-CM | POA: Diagnosis not present

## 2017-10-10 DIAGNOSIS — G309 Alzheimer's disease, unspecified: Secondary | ICD-10-CM | POA: Diagnosis not present

## 2017-10-10 DIAGNOSIS — E1169 Type 2 diabetes mellitus with other specified complication: Secondary | ICD-10-CM | POA: Diagnosis not present

## 2017-10-10 DIAGNOSIS — M4628 Osteomyelitis of vertebra, sacral and sacrococcygeal region: Secondary | ICD-10-CM

## 2017-10-10 DIAGNOSIS — N183 Chronic kidney disease, stage 3 (moderate): Secondary | ICD-10-CM | POA: Diagnosis not present

## 2017-10-10 DIAGNOSIS — F0280 Dementia in other diseases classified elsewhere without behavioral disturbance: Secondary | ICD-10-CM | POA: Diagnosis not present

## 2017-10-10 NOTE — Telephone Encounter (Signed)
Copied from [place]. Topic: Quick Communication - Rx Refill/Question >> Oct 10, 2017  2:00 PM Tye [place] wrote: Pt's daughter [name] called to give [facility] a message, pt has enough pills to last until Sunday but Monday pt will be out of medication, contact pt to advise if needed  Medication: HYDROcodone-acetaminophen (NORCO) 5-325 MG tablet [[id number]]   Preferred Pharmacy (with phone number or street name): [place]: Please be advised that RX refills may take up to 3 business days. We ask that you follow-up with your pharmacy.

## 2017-10-11 ENCOUNTER — Encounter (HOSPITAL_BASED_OUTPATIENT_CLINIC_OR_DEPARTMENT_OTHER): Payer: Medicare HMO | Attending: Physician Assistant

## 2017-10-11 DIAGNOSIS — E11622 Type 2 diabetes mellitus with other skin ulcer: Secondary | ICD-10-CM | POA: Insufficient documentation

## 2017-10-11 DIAGNOSIS — F0280 Dementia in other diseases classified elsewhere without behavioral disturbance: Secondary | ICD-10-CM | POA: Diagnosis not present

## 2017-10-11 DIAGNOSIS — I129 Hypertensive chronic kidney disease with stage 1 through stage 4 chronic kidney disease, or unspecified chronic kidney disease: Secondary | ICD-10-CM | POA: Diagnosis not present

## 2017-10-11 DIAGNOSIS — L98496 Non-pressure chronic ulcer of skin of other sites with bone involvement without evidence of necrosis: Secondary | ICD-10-CM | POA: Insufficient documentation

## 2017-10-11 DIAGNOSIS — Z7984 Long term (current) use of oral hypoglycemic drugs: Secondary | ICD-10-CM | POA: Diagnosis not present

## 2017-10-11 DIAGNOSIS — L98426 Non-pressure chronic ulcer of back with bone involvement without evidence of necrosis: Secondary | ICD-10-CM | POA: Diagnosis not present

## 2017-10-11 DIAGNOSIS — F0390 Unspecified dementia without behavioral disturbance: Secondary | ICD-10-CM | POA: Diagnosis not present

## 2017-10-11 DIAGNOSIS — G309 Alzheimer's disease, unspecified: Secondary | ICD-10-CM | POA: Insufficient documentation

## 2017-10-11 DIAGNOSIS — N183 Chronic kidney disease, stage 3 (moderate): Secondary | ICD-10-CM | POA: Diagnosis not present

## 2017-10-11 DIAGNOSIS — E1122 Type 2 diabetes mellitus with diabetic chronic kidney disease: Secondary | ICD-10-CM | POA: Insufficient documentation

## 2017-10-11 MED ORDER — HYDROCODONE-ACETAMINOPHEN 5-325 MG PO TABS: 1.0000 | ORAL_TABLET | Freq: Three times a day (TID) | ORAL | 0 refills | Status: DC | PRN

## 2017-10-12 ENCOUNTER — Telehealth: Payer: Self-pay | Admitting: Adult Health

## 2017-10-12 DIAGNOSIS — N183 Chronic kidney disease, stage 3 (moderate): Secondary | ICD-10-CM | POA: Diagnosis not present

## 2017-10-12 DIAGNOSIS — G309 Alzheimer's disease, unspecified: Secondary | ICD-10-CM | POA: Diagnosis not present

## 2017-10-12 DIAGNOSIS — E1122 Type 2 diabetes mellitus with diabetic chronic kidney disease: Secondary | ICD-10-CM | POA: Diagnosis not present

## 2017-10-12 DIAGNOSIS — M4628 Osteomyelitis of vertebra, sacral and sacrococcygeal region: Secondary | ICD-10-CM | POA: Diagnosis not present

## 2017-10-12 DIAGNOSIS — F0280 Dementia in other diseases classified elsewhere without behavioral disturbance: Secondary | ICD-10-CM | POA: Diagnosis not present

## 2017-10-12 DIAGNOSIS — E1169 Type 2 diabetes mellitus with other specified complication: Secondary | ICD-10-CM | POA: Diagnosis not present

## 2017-10-12 DIAGNOSIS — L0501 Pilonidal cyst with abscess: Secondary | ICD-10-CM | POA: Diagnosis not present

## 2017-10-12 DIAGNOSIS — I130 Hypertensive heart and chronic kidney disease with heart failure and stage 1 through stage 4 chronic kidney disease, or unspecified chronic kidney disease: Secondary | ICD-10-CM | POA: Diagnosis not present

## 2017-10-12 DIAGNOSIS — I509 Heart failure, unspecified: Secondary | ICD-10-CM | POA: Diagnosis not present

## 2017-10-12 NOTE — Telephone Encounter (Signed)
Ok for verbal orders ?

## 2017-10-12 NOTE — Telephone Encounter (Signed)
Copied from [place] [phone]. Topic: General - Other >> Oct 12, 2017  2:10 PM Lolita Rieger, [place] wrote: Reason for CRM: Claiborne Billings form [facility] needs verbals for PT for once a week for 4 weeks please call [phone]

## 2017-10-13 DIAGNOSIS — F0280 Dementia in other diseases classified elsewhere without behavioral disturbance: Secondary | ICD-10-CM | POA: Diagnosis not present

## 2017-10-13 DIAGNOSIS — I130 Hypertensive heart and chronic kidney disease with heart failure and stage 1 through stage 4 chronic kidney disease, or unspecified chronic kidney disease: Secondary | ICD-10-CM | POA: Diagnosis not present

## 2017-10-13 DIAGNOSIS — M4628 Osteomyelitis of vertebra, sacral and sacrococcygeal region: Secondary | ICD-10-CM | POA: Diagnosis not present

## 2017-10-13 DIAGNOSIS — E1169 Type 2 diabetes mellitus with other specified complication: Secondary | ICD-10-CM | POA: Diagnosis not present

## 2017-10-13 DIAGNOSIS — G309 Alzheimer's disease, unspecified: Secondary | ICD-10-CM | POA: Diagnosis not present

## 2017-10-13 DIAGNOSIS — N183 Chronic kidney disease, stage 3 (moderate): Secondary | ICD-10-CM | POA: Diagnosis not present

## 2017-10-13 DIAGNOSIS — L0501 Pilonidal cyst with abscess: Secondary | ICD-10-CM | POA: Diagnosis not present

## 2017-10-13 DIAGNOSIS — I509 Heart failure, unspecified: Secondary | ICD-10-CM | POA: Diagnosis not present

## 2017-10-13 DIAGNOSIS — E1122 Type 2 diabetes mellitus with diabetic chronic kidney disease: Secondary | ICD-10-CM | POA: Diagnosis not present

## 2017-10-16 DIAGNOSIS — E1122 Type 2 diabetes mellitus with diabetic chronic kidney disease: Secondary | ICD-10-CM | POA: Diagnosis not present

## 2017-10-16 DIAGNOSIS — I130 Hypertensive heart and chronic kidney disease with heart failure and stage 1 through stage 4 chronic kidney disease, or unspecified chronic kidney disease: Secondary | ICD-10-CM | POA: Diagnosis not present

## 2017-10-16 DIAGNOSIS — E1169 Type 2 diabetes mellitus with other specified complication: Secondary | ICD-10-CM | POA: Diagnosis not present

## 2017-10-16 DIAGNOSIS — N183 Chronic kidney disease, stage 3 (moderate): Secondary | ICD-10-CM | POA: Diagnosis not present

## 2017-10-16 DIAGNOSIS — F0280 Dementia in other diseases classified elsewhere without behavioral disturbance: Secondary | ICD-10-CM | POA: Diagnosis not present

## 2017-10-16 DIAGNOSIS — L0501 Pilonidal cyst with abscess: Secondary | ICD-10-CM | POA: Diagnosis not present

## 2017-10-16 DIAGNOSIS — I509 Heart failure, unspecified: Secondary | ICD-10-CM | POA: Diagnosis not present

## 2017-10-16 DIAGNOSIS — M4628 Osteomyelitis of vertebra, sacral and sacrococcygeal region: Secondary | ICD-10-CM | POA: Diagnosis not present

## 2017-10-16 DIAGNOSIS — G309 Alzheimer's disease, unspecified: Secondary | ICD-10-CM | POA: Diagnosis not present

## 2017-10-17 DIAGNOSIS — R0602 Shortness of breath: Secondary | ICD-10-CM | POA: Diagnosis not present

## 2017-10-17 DIAGNOSIS — G309 Alzheimer's disease, unspecified: Secondary | ICD-10-CM | POA: Diagnosis not present

## 2017-10-17 DIAGNOSIS — E1129 Type 2 diabetes mellitus with other diabetic kidney complication: Secondary | ICD-10-CM | POA: Diagnosis not present

## 2017-10-17 DIAGNOSIS — I5033 Acute on chronic diastolic (congestive) heart failure: Secondary | ICD-10-CM | POA: Diagnosis not present

## 2017-10-17 NOTE — Telephone Encounter (Signed)
Kelly  From [facility]  Called today to check on the status of the verbal order for PT once a week for 4 weeks.  Contact number [phone]

## 2017-10-17 NOTE — Telephone Encounter (Signed)
Left a detailed message for Anna [name] on identified voicemail informing her to proceed with PT orders once weekly for 4 weeks.  Call back if any questions.

## 2017-10-17 NOTE — Telephone Encounter (Signed)
Ok for verbal orders ?

## 2017-10-18 ENCOUNTER — Telehealth: Payer: Self-pay | Admitting: Adult Health

## 2017-10-18 DIAGNOSIS — M4628 Osteomyelitis of vertebra, sacral and sacrococcygeal region: Secondary | ICD-10-CM | POA: Diagnosis not present

## 2017-10-18 DIAGNOSIS — L0501 Pilonidal cyst with abscess: Secondary | ICD-10-CM | POA: Diagnosis not present

## 2017-10-18 DIAGNOSIS — F0280 Dementia in other diseases classified elsewhere without behavioral disturbance: Secondary | ICD-10-CM | POA: Diagnosis not present

## 2017-10-18 DIAGNOSIS — N183 Chronic kidney disease, stage 3 (moderate): Secondary | ICD-10-CM | POA: Diagnosis not present

## 2017-10-18 DIAGNOSIS — I130 Hypertensive heart and chronic kidney disease with heart failure and stage 1 through stage 4 chronic kidney disease, or unspecified chronic kidney disease: Secondary | ICD-10-CM | POA: Diagnosis not present

## 2017-10-18 DIAGNOSIS — I509 Heart failure, unspecified: Secondary | ICD-10-CM | POA: Diagnosis not present

## 2017-10-18 DIAGNOSIS — G309 Alzheimer's disease, unspecified: Secondary | ICD-10-CM | POA: Diagnosis not present

## 2017-10-18 DIAGNOSIS — E1169 Type 2 diabetes mellitus with other specified complication: Secondary | ICD-10-CM | POA: Diagnosis not present

## 2017-10-18 DIAGNOSIS — E1122 Type 2 diabetes mellitus with diabetic chronic kidney disease: Secondary | ICD-10-CM | POA: Diagnosis not present

## 2017-10-18 NOTE — Telephone Encounter (Signed)
Ok for verbal orders ?

## 2017-10-18 NOTE — Telephone Encounter (Signed)
Copied from [place] [phone]. Topic: Quick Communication - See Telephone Encounter >> Oct 18, 2017  2:51 PM Boyd Kerbs wrote: CRM for notification. See Telephone encounter for:   Anna [name] [facility] [phone] Verbal orders for OT 1 x 4, focusing on self care and strength and transfers.   10/18/17.

## 2017-10-18 NOTE — Telephone Encounter (Signed)
Spoke to [place] at [facility] and instructed her to proceed with orders.

## 2017-10-19 DIAGNOSIS — I509 Heart failure, unspecified: Secondary | ICD-10-CM | POA: Diagnosis not present

## 2017-10-19 DIAGNOSIS — G309 Alzheimer's disease, unspecified: Secondary | ICD-10-CM | POA: Diagnosis not present

## 2017-10-19 DIAGNOSIS — E1122 Type 2 diabetes mellitus with diabetic chronic kidney disease: Secondary | ICD-10-CM | POA: Diagnosis not present

## 2017-10-19 DIAGNOSIS — F0280 Dementia in other diseases classified elsewhere without behavioral disturbance: Secondary | ICD-10-CM | POA: Diagnosis not present

## 2017-10-19 DIAGNOSIS — N183 Chronic kidney disease, stage 3 (moderate): Secondary | ICD-10-CM | POA: Diagnosis not present

## 2017-10-19 DIAGNOSIS — L0501 Pilonidal cyst with abscess: Secondary | ICD-10-CM | POA: Diagnosis not present

## 2017-10-19 DIAGNOSIS — E1169 Type 2 diabetes mellitus with other specified complication: Secondary | ICD-10-CM | POA: Diagnosis not present

## 2017-10-19 DIAGNOSIS — I130 Hypertensive heart and chronic kidney disease with heart failure and stage 1 through stage 4 chronic kidney disease, or unspecified chronic kidney disease: Secondary | ICD-10-CM | POA: Diagnosis not present

## 2017-10-19 DIAGNOSIS — M4628 Osteomyelitis of vertebra, sacral and sacrococcygeal region: Secondary | ICD-10-CM | POA: Diagnosis not present

## 2017-10-20 DIAGNOSIS — I509 Heart failure, unspecified: Secondary | ICD-10-CM | POA: Diagnosis not present

## 2017-10-20 DIAGNOSIS — G309 Alzheimer's disease, unspecified: Secondary | ICD-10-CM | POA: Diagnosis not present

## 2017-10-20 DIAGNOSIS — I130 Hypertensive heart and chronic kidney disease with heart failure and stage 1 through stage 4 chronic kidney disease, or unspecified chronic kidney disease: Secondary | ICD-10-CM | POA: Diagnosis not present

## 2017-10-20 DIAGNOSIS — L0501 Pilonidal cyst with abscess: Secondary | ICD-10-CM | POA: Diagnosis not present

## 2017-10-20 DIAGNOSIS — E1122 Type 2 diabetes mellitus with diabetic chronic kidney disease: Secondary | ICD-10-CM | POA: Diagnosis not present

## 2017-10-20 DIAGNOSIS — N183 Chronic kidney disease, stage 3 (moderate): Secondary | ICD-10-CM | POA: Diagnosis not present

## 2017-10-20 DIAGNOSIS — F0280 Dementia in other diseases classified elsewhere without behavioral disturbance: Secondary | ICD-10-CM | POA: Diagnosis not present

## 2017-10-20 DIAGNOSIS — M4628 Osteomyelitis of vertebra, sacral and sacrococcygeal region: Secondary | ICD-10-CM | POA: Diagnosis not present

## 2017-10-20 DIAGNOSIS — E1169 Type 2 diabetes mellitus with other specified complication: Secondary | ICD-10-CM | POA: Diagnosis not present

## 2017-10-23 ENCOUNTER — Other Ambulatory Visit: Payer: Self-pay | Admitting: Cardiology

## 2017-10-23 ENCOUNTER — Ambulatory Visit: Payer: Medicare HMO | Admitting: Infectious Diseases

## 2017-10-23 DIAGNOSIS — I509 Heart failure, unspecified: Secondary | ICD-10-CM | POA: Diagnosis not present

## 2017-10-23 DIAGNOSIS — E1122 Type 2 diabetes mellitus with diabetic chronic kidney disease: Secondary | ICD-10-CM | POA: Diagnosis not present

## 2017-10-23 DIAGNOSIS — E1169 Type 2 diabetes mellitus with other specified complication: Secondary | ICD-10-CM | POA: Diagnosis not present

## 2017-10-23 DIAGNOSIS — M4628 Osteomyelitis of vertebra, sacral and sacrococcygeal region: Secondary | ICD-10-CM | POA: Diagnosis not present

## 2017-10-23 DIAGNOSIS — I130 Hypertensive heart and chronic kidney disease with heart failure and stage 1 through stage 4 chronic kidney disease, or unspecified chronic kidney disease: Secondary | ICD-10-CM | POA: Diagnosis not present

## 2017-10-23 DIAGNOSIS — L0501 Pilonidal cyst with abscess: Secondary | ICD-10-CM | POA: Diagnosis not present

## 2017-10-23 DIAGNOSIS — F0280 Dementia in other diseases classified elsewhere without behavioral disturbance: Secondary | ICD-10-CM | POA: Diagnosis not present

## 2017-10-23 DIAGNOSIS — N183 Chronic kidney disease, stage 3 (moderate): Secondary | ICD-10-CM | POA: Diagnosis not present

## 2017-10-23 DIAGNOSIS — G309 Alzheimer's disease, unspecified: Secondary | ICD-10-CM | POA: Diagnosis not present

## 2017-10-24 DIAGNOSIS — L0501 Pilonidal cyst with abscess: Secondary | ICD-10-CM | POA: Diagnosis not present

## 2017-10-24 DIAGNOSIS — N183 Chronic kidney disease, stage 3 (moderate): Secondary | ICD-10-CM | POA: Diagnosis not present

## 2017-10-24 DIAGNOSIS — G309 Alzheimer's disease, unspecified: Secondary | ICD-10-CM | POA: Diagnosis not present

## 2017-10-24 DIAGNOSIS — I509 Heart failure, unspecified: Secondary | ICD-10-CM | POA: Diagnosis not present

## 2017-10-24 DIAGNOSIS — M4628 Osteomyelitis of vertebra, sacral and sacrococcygeal region: Secondary | ICD-10-CM | POA: Diagnosis not present

## 2017-10-24 DIAGNOSIS — I130 Hypertensive heart and chronic kidney disease with heart failure and stage 1 through stage 4 chronic kidney disease, or unspecified chronic kidney disease: Secondary | ICD-10-CM | POA: Diagnosis not present

## 2017-10-24 DIAGNOSIS — E1122 Type 2 diabetes mellitus with diabetic chronic kidney disease: Secondary | ICD-10-CM | POA: Diagnosis not present

## 2017-10-24 DIAGNOSIS — E1169 Type 2 diabetes mellitus with other specified complication: Secondary | ICD-10-CM | POA: Diagnosis not present

## 2017-10-24 DIAGNOSIS — F0280 Dementia in other diseases classified elsewhere without behavioral disturbance: Secondary | ICD-10-CM | POA: Diagnosis not present

## 2017-10-25 DIAGNOSIS — F0280 Dementia in other diseases classified elsewhere without behavioral disturbance: Secondary | ICD-10-CM | POA: Diagnosis not present

## 2017-10-25 DIAGNOSIS — I509 Heart failure, unspecified: Secondary | ICD-10-CM | POA: Diagnosis not present

## 2017-10-25 DIAGNOSIS — L0501 Pilonidal cyst with abscess: Secondary | ICD-10-CM | POA: Diagnosis not present

## 2017-10-25 DIAGNOSIS — E1169 Type 2 diabetes mellitus with other specified complication: Secondary | ICD-10-CM | POA: Diagnosis not present

## 2017-10-25 DIAGNOSIS — N183 Chronic kidney disease, stage 3 (moderate): Secondary | ICD-10-CM | POA: Diagnosis not present

## 2017-10-25 DIAGNOSIS — G309 Alzheimer's disease, unspecified: Secondary | ICD-10-CM | POA: Diagnosis not present

## 2017-10-25 DIAGNOSIS — I130 Hypertensive heart and chronic kidney disease with heart failure and stage 1 through stage 4 chronic kidney disease, or unspecified chronic kidney disease: Secondary | ICD-10-CM | POA: Diagnosis not present

## 2017-10-25 DIAGNOSIS — M4628 Osteomyelitis of vertebra, sacral and sacrococcygeal region: Secondary | ICD-10-CM | POA: Diagnosis not present

## 2017-10-25 DIAGNOSIS — E1122 Type 2 diabetes mellitus with diabetic chronic kidney disease: Secondary | ICD-10-CM | POA: Diagnosis not present

## 2017-10-27 DIAGNOSIS — L0502 Pilonidal sinus with abscess: Secondary | ICD-10-CM | POA: Diagnosis not present

## 2017-10-27 DIAGNOSIS — F0280 Dementia in other diseases classified elsewhere without behavioral disturbance: Secondary | ICD-10-CM | POA: Diagnosis not present

## 2017-10-27 DIAGNOSIS — E1169 Type 2 diabetes mellitus with other specified complication: Secondary | ICD-10-CM | POA: Diagnosis not present

## 2017-10-27 DIAGNOSIS — E1129 Type 2 diabetes mellitus with other diabetic kidney complication: Secondary | ICD-10-CM | POA: Diagnosis not present

## 2017-10-27 DIAGNOSIS — I130 Hypertensive heart and chronic kidney disease with heart failure and stage 1 through stage 4 chronic kidney disease, or unspecified chronic kidney disease: Secondary | ICD-10-CM | POA: Diagnosis not present

## 2017-10-27 DIAGNOSIS — L0501 Pilonidal cyst with abscess: Secondary | ICD-10-CM | POA: Diagnosis not present

## 2017-10-27 DIAGNOSIS — G309 Alzheimer's disease, unspecified: Secondary | ICD-10-CM | POA: Diagnosis not present

## 2017-10-27 DIAGNOSIS — M4628 Osteomyelitis of vertebra, sacral and sacrococcygeal region: Secondary | ICD-10-CM | POA: Diagnosis not present

## 2017-10-27 DIAGNOSIS — N183 Chronic kidney disease, stage 3 (moderate): Secondary | ICD-10-CM | POA: Diagnosis not present

## 2017-10-27 DIAGNOSIS — T8189XA Other complications of procedures, not elsewhere classified, initial encounter: Secondary | ICD-10-CM | POA: Diagnosis not present

## 2017-10-27 DIAGNOSIS — I509 Heart failure, unspecified: Secondary | ICD-10-CM | POA: Diagnosis not present

## 2017-10-27 DIAGNOSIS — E1122 Type 2 diabetes mellitus with diabetic chronic kidney disease: Secondary | ICD-10-CM | POA: Diagnosis not present

## 2017-10-29 DIAGNOSIS — L0502 Pilonidal sinus with abscess: Secondary | ICD-10-CM | POA: Diagnosis not present

## 2017-10-29 DIAGNOSIS — E1129 Type 2 diabetes mellitus with other diabetic kidney complication: Secondary | ICD-10-CM | POA: Diagnosis not present

## 2017-10-29 DIAGNOSIS — T8189XA Other complications of procedures, not elsewhere classified, initial encounter: Secondary | ICD-10-CM | POA: Diagnosis not present

## 2017-10-30 DIAGNOSIS — L0501 Pilonidal cyst with abscess: Secondary | ICD-10-CM | POA: Diagnosis not present

## 2017-10-30 DIAGNOSIS — G309 Alzheimer's disease, unspecified: Secondary | ICD-10-CM | POA: Diagnosis not present

## 2017-10-30 DIAGNOSIS — I509 Heart failure, unspecified: Secondary | ICD-10-CM | POA: Diagnosis not present

## 2017-10-30 DIAGNOSIS — M4628 Osteomyelitis of vertebra, sacral and sacrococcygeal region: Secondary | ICD-10-CM | POA: Diagnosis not present

## 2017-10-30 DIAGNOSIS — E1169 Type 2 diabetes mellitus with other specified complication: Secondary | ICD-10-CM | POA: Diagnosis not present

## 2017-10-30 DIAGNOSIS — I130 Hypertensive heart and chronic kidney disease with heart failure and stage 1 through stage 4 chronic kidney disease, or unspecified chronic kidney disease: Secondary | ICD-10-CM | POA: Diagnosis not present

## 2017-10-30 DIAGNOSIS — E1122 Type 2 diabetes mellitus with diabetic chronic kidney disease: Secondary | ICD-10-CM | POA: Diagnosis not present

## 2017-10-30 DIAGNOSIS — F0280 Dementia in other diseases classified elsewhere without behavioral disturbance: Secondary | ICD-10-CM | POA: Diagnosis not present

## 2017-10-30 DIAGNOSIS — N183 Chronic kidney disease, stage 3 (moderate): Secondary | ICD-10-CM | POA: Diagnosis not present

## 2017-10-31 DIAGNOSIS — E1169 Type 2 diabetes mellitus with other specified complication: Secondary | ICD-10-CM | POA: Diagnosis not present

## 2017-10-31 DIAGNOSIS — M4628 Osteomyelitis of vertebra, sacral and sacrococcygeal region: Secondary | ICD-10-CM | POA: Diagnosis not present

## 2017-10-31 DIAGNOSIS — E1122 Type 2 diabetes mellitus with diabetic chronic kidney disease: Secondary | ICD-10-CM | POA: Diagnosis not present

## 2017-10-31 DIAGNOSIS — I130 Hypertensive heart and chronic kidney disease with heart failure and stage 1 through stage 4 chronic kidney disease, or unspecified chronic kidney disease: Secondary | ICD-10-CM | POA: Diagnosis not present

## 2017-10-31 DIAGNOSIS — G309 Alzheimer's disease, unspecified: Secondary | ICD-10-CM | POA: Diagnosis not present

## 2017-10-31 DIAGNOSIS — I509 Heart failure, unspecified: Secondary | ICD-10-CM | POA: Diagnosis not present

## 2017-10-31 DIAGNOSIS — F0280 Dementia in other diseases classified elsewhere without behavioral disturbance: Secondary | ICD-10-CM | POA: Diagnosis not present

## 2017-10-31 DIAGNOSIS — N183 Chronic kidney disease, stage 3 (moderate): Secondary | ICD-10-CM | POA: Diagnosis not present

## 2017-10-31 DIAGNOSIS — L0501 Pilonidal cyst with abscess: Secondary | ICD-10-CM | POA: Diagnosis not present

## 2017-11-01 ENCOUNTER — Encounter (HOSPITAL_BASED_OUTPATIENT_CLINIC_OR_DEPARTMENT_OTHER): Payer: Medicare HMO | Attending: Physician Assistant

## 2017-11-01 DIAGNOSIS — I5032 Chronic diastolic (congestive) heart failure: Secondary | ICD-10-CM | POA: Diagnosis not present

## 2017-11-01 DIAGNOSIS — E11622 Type 2 diabetes mellitus with other skin ulcer: Secondary | ICD-10-CM | POA: Diagnosis not present

## 2017-11-01 DIAGNOSIS — E1122 Type 2 diabetes mellitus with diabetic chronic kidney disease: Secondary | ICD-10-CM | POA: Insufficient documentation

## 2017-11-01 DIAGNOSIS — Z7984 Long term (current) use of oral hypoglycemic drugs: Secondary | ICD-10-CM | POA: Insufficient documentation

## 2017-11-01 DIAGNOSIS — N183 Chronic kidney disease, stage 3 (moderate): Secondary | ICD-10-CM | POA: Insufficient documentation

## 2017-11-01 DIAGNOSIS — L98492 Non-pressure chronic ulcer of skin of other sites with fat layer exposed: Secondary | ICD-10-CM | POA: Insufficient documentation

## 2017-11-01 DIAGNOSIS — G309 Alzheimer's disease, unspecified: Secondary | ICD-10-CM | POA: Insufficient documentation

## 2017-11-02 DIAGNOSIS — M4628 Osteomyelitis of vertebra, sacral and sacrococcygeal region: Secondary | ICD-10-CM | POA: Diagnosis not present

## 2017-11-02 DIAGNOSIS — E1169 Type 2 diabetes mellitus with other specified complication: Secondary | ICD-10-CM | POA: Diagnosis not present

## 2017-11-02 DIAGNOSIS — F0280 Dementia in other diseases classified elsewhere without behavioral disturbance: Secondary | ICD-10-CM | POA: Diagnosis not present

## 2017-11-02 DIAGNOSIS — G309 Alzheimer's disease, unspecified: Secondary | ICD-10-CM | POA: Diagnosis not present

## 2017-11-02 DIAGNOSIS — N183 Chronic kidney disease, stage 3 (moderate): Secondary | ICD-10-CM | POA: Diagnosis not present

## 2017-11-02 DIAGNOSIS — I130 Hypertensive heart and chronic kidney disease with heart failure and stage 1 through stage 4 chronic kidney disease, or unspecified chronic kidney disease: Secondary | ICD-10-CM | POA: Diagnosis not present

## 2017-11-02 DIAGNOSIS — L0501 Pilonidal cyst with abscess: Secondary | ICD-10-CM | POA: Diagnosis not present

## 2017-11-02 DIAGNOSIS — I509 Heart failure, unspecified: Secondary | ICD-10-CM | POA: Diagnosis not present

## 2017-11-02 DIAGNOSIS — E1122 Type 2 diabetes mellitus with diabetic chronic kidney disease: Secondary | ICD-10-CM | POA: Diagnosis not present

## 2017-11-03 DIAGNOSIS — N183 Chronic kidney disease, stage 3 (moderate): Secondary | ICD-10-CM | POA: Diagnosis not present

## 2017-11-03 DIAGNOSIS — L0501 Pilonidal cyst with abscess: Secondary | ICD-10-CM | POA: Diagnosis not present

## 2017-11-03 DIAGNOSIS — I509 Heart failure, unspecified: Secondary | ICD-10-CM | POA: Diagnosis not present

## 2017-11-03 DIAGNOSIS — E1169 Type 2 diabetes mellitus with other specified complication: Secondary | ICD-10-CM | POA: Diagnosis not present

## 2017-11-03 DIAGNOSIS — E1122 Type 2 diabetes mellitus with diabetic chronic kidney disease: Secondary | ICD-10-CM | POA: Diagnosis not present

## 2017-11-03 DIAGNOSIS — M4628 Osteomyelitis of vertebra, sacral and sacrococcygeal region: Secondary | ICD-10-CM | POA: Diagnosis not present

## 2017-11-03 DIAGNOSIS — F0280 Dementia in other diseases classified elsewhere without behavioral disturbance: Secondary | ICD-10-CM | POA: Diagnosis not present

## 2017-11-03 DIAGNOSIS — I130 Hypertensive heart and chronic kidney disease with heart failure and stage 1 through stage 4 chronic kidney disease, or unspecified chronic kidney disease: Secondary | ICD-10-CM | POA: Diagnosis not present

## 2017-11-03 DIAGNOSIS — G309 Alzheimer's disease, unspecified: Secondary | ICD-10-CM | POA: Diagnosis not present

## 2017-11-06 ENCOUNTER — Encounter: Payer: Self-pay | Admitting: Infectious Diseases

## 2017-11-06 ENCOUNTER — Ambulatory Visit: Payer: Medicare HMO | Admitting: Infectious Diseases

## 2017-11-06 DIAGNOSIS — M4628 Osteomyelitis of vertebra, sacral and sacrococcygeal region: Secondary | ICD-10-CM | POA: Diagnosis not present

## 2017-11-06 DIAGNOSIS — F0280 Dementia in other diseases classified elsewhere without behavioral disturbance: Secondary | ICD-10-CM | POA: Diagnosis not present

## 2017-11-06 DIAGNOSIS — A1801 Tuberculosis of spine: Secondary | ICD-10-CM

## 2017-11-06 DIAGNOSIS — G309 Alzheimer's disease, unspecified: Secondary | ICD-10-CM | POA: Diagnosis not present

## 2017-11-06 DIAGNOSIS — I509 Heart failure, unspecified: Secondary | ICD-10-CM | POA: Diagnosis not present

## 2017-11-06 DIAGNOSIS — L0501 Pilonidal cyst with abscess: Secondary | ICD-10-CM | POA: Diagnosis not present

## 2017-11-06 DIAGNOSIS — E1169 Type 2 diabetes mellitus with other specified complication: Secondary | ICD-10-CM | POA: Diagnosis not present

## 2017-11-06 DIAGNOSIS — I130 Hypertensive heart and chronic kidney disease with heart failure and stage 1 through stage 4 chronic kidney disease, or unspecified chronic kidney disease: Secondary | ICD-10-CM | POA: Diagnosis not present

## 2017-11-06 DIAGNOSIS — E1122 Type 2 diabetes mellitus with diabetic chronic kidney disease: Secondary | ICD-10-CM | POA: Diagnosis not present

## 2017-11-06 DIAGNOSIS — N183 Chronic kidney disease, stage 3 (moderate): Secondary | ICD-10-CM | POA: Diagnosis not present

## 2017-11-06 NOTE — Assessment & Plan Note (Signed)
She appears to be healing well Will have VAC replaced today No further anbx at this point.  Stressed to daughter importance of 1) wound care f/u, 2) offloading (air mattress, doughnut), 3) nutrition, 4) antibiotics as needed.  She has completed her anbx and she can f/u with [place] prn.

## 2017-11-06 NOTE — Progress Notes (Signed)
   Subjective:    Patient ID: Anna [name], female    DOB: [birth year], 84 y.o.   MRN: [id number]  HPI 84 yo F with hx of alzheimer's, who was hospitalized 82-18 to 82-26-18 with coccygeal abscess. This grew E faecalis in a wound Cx and Strep on a bone bx. She was treated with 2 weeks of unasyn to be followed by 2 months of augmentin (started ~ 07-31-17).   She had ID f/u on 12-17 and was continued on her augmentin with a plan for 6 additional weeks.  She was seen in ID office on 1-22 and was planned to get 2 more weeks of augmentin.  Has been doing well .  Had [place] last week and bone is now covered with tissue. Has needed fewer sponges to get wound packed. Is closing well. No tunneling.  Eating well, no f/c.   Continues to get pain rx from pcp.   Review of Systems Please see HPI. All other systems reviewed and negative.     Objective:   Physical Exam  Constitutional: She is sleeping.  Skin:             Assessment & Plan:

## 2017-11-07 ENCOUNTER — Telehealth: Payer: Self-pay | Admitting: Family Medicine

## 2017-11-07 DIAGNOSIS — G309 Alzheimer's disease, unspecified: Secondary | ICD-10-CM | POA: Diagnosis not present

## 2017-11-07 DIAGNOSIS — L0501 Pilonidal cyst with abscess: Secondary | ICD-10-CM | POA: Diagnosis not present

## 2017-11-07 DIAGNOSIS — F0280 Dementia in other diseases classified elsewhere without behavioral disturbance: Secondary | ICD-10-CM | POA: Diagnosis not present

## 2017-11-07 DIAGNOSIS — I130 Hypertensive heart and chronic kidney disease with heart failure and stage 1 through stage 4 chronic kidney disease, or unspecified chronic kidney disease: Secondary | ICD-10-CM | POA: Diagnosis not present

## 2017-11-07 DIAGNOSIS — M4628 Osteomyelitis of vertebra, sacral and sacrococcygeal region: Secondary | ICD-10-CM | POA: Diagnosis not present

## 2017-11-07 DIAGNOSIS — E1169 Type 2 diabetes mellitus with other specified complication: Secondary | ICD-10-CM | POA: Diagnosis not present

## 2017-11-07 DIAGNOSIS — N183 Chronic kidney disease, stage 3 (moderate): Secondary | ICD-10-CM | POA: Diagnosis not present

## 2017-11-07 DIAGNOSIS — I509 Heart failure, unspecified: Secondary | ICD-10-CM | POA: Diagnosis not present

## 2017-11-07 DIAGNOSIS — E1122 Type 2 diabetes mellitus with diabetic chronic kidney disease: Secondary | ICD-10-CM | POA: Diagnosis not present

## 2017-11-07 MED ORDER — HYDROCODONE-ACETAMINOPHEN 5-325 MG PO TABS: 1.0000 | ORAL_TABLET | Freq: Three times a day (TID) | ORAL | 0 refills | Status: AC | PRN

## 2017-11-07 NOTE — Telephone Encounter (Signed)
Copied from [place] [id number]. Topic: General - Other >> Nov 07, 2017 10:22 AM Oneta Rack wrote: Caller name: Smith,[name] Relation to pt: daughter Call back number: [phone] Pharmacy: [facility], [place] [phone] (Phone) [phone] (Fax)    Reason for call:  Requesting HYDROcodone-acetaminophen ([place]) 5-325 MG tablet refill,  please allow 48 to 72 hour turn around time, patient will run out on Monday, please advise >> Nov 07, 2017 10:26 AM Oneta Rack wrote: Relation to pt: daughter Call back number: [phone] Pharmacy: [facility], [place]. [phone] (Phone) [phone] (Fax)    Reason for call:  Requesting HYDROcodone-acetaminophen ([place]) 5-325 MG tablet refill,  please allow 48 to 72 hour turn around time, patient will run out on Monday, please advise

## 2017-11-08 ENCOUNTER — Other Ambulatory Visit: Payer: Self-pay | Admitting: Adult Health

## 2017-11-08 ENCOUNTER — Ambulatory Visit: Payer: Medicare HMO | Admitting: Podiatry

## 2017-11-08 ENCOUNTER — Telehealth: Payer: Self-pay | Admitting: Family Medicine

## 2017-11-08 ENCOUNTER — Encounter: Payer: Self-pay | Admitting: Podiatry

## 2017-11-08 DIAGNOSIS — I130 Hypertensive heart and chronic kidney disease with heart failure and stage 1 through stage 4 chronic kidney disease, or unspecified chronic kidney disease: Secondary | ICD-10-CM | POA: Diagnosis not present

## 2017-11-08 DIAGNOSIS — F0280 Dementia in other diseases classified elsewhere without behavioral disturbance: Secondary | ICD-10-CM | POA: Diagnosis not present

## 2017-11-08 DIAGNOSIS — B351 Tinea unguium: Secondary | ICD-10-CM

## 2017-11-08 DIAGNOSIS — S91302A Unspecified open wound, left foot, initial encounter: Secondary | ICD-10-CM

## 2017-11-08 DIAGNOSIS — M4628 Osteomyelitis of vertebra, sacral and sacrococcygeal region: Secondary | ICD-10-CM | POA: Diagnosis not present

## 2017-11-08 DIAGNOSIS — G309 Alzheimer's disease, unspecified: Secondary | ICD-10-CM | POA: Diagnosis not present

## 2017-11-08 DIAGNOSIS — E1122 Type 2 diabetes mellitus with diabetic chronic kidney disease: Secondary | ICD-10-CM | POA: Diagnosis not present

## 2017-11-08 DIAGNOSIS — M79675 Pain in left toe(s): Secondary | ICD-10-CM

## 2017-11-08 DIAGNOSIS — N183 Chronic kidney disease, stage 3 (moderate): Secondary | ICD-10-CM | POA: Diagnosis not present

## 2017-11-08 DIAGNOSIS — I509 Heart failure, unspecified: Secondary | ICD-10-CM | POA: Diagnosis not present

## 2017-11-08 DIAGNOSIS — L0501 Pilonidal cyst with abscess: Secondary | ICD-10-CM | POA: Diagnosis not present

## 2017-11-08 DIAGNOSIS — E1169 Type 2 diabetes mellitus with other specified complication: Secondary | ICD-10-CM | POA: Diagnosis not present

## 2017-11-08 NOTE — Progress Notes (Signed)
Subjective:   Patient ID: Anna [name], female   DOB: 84 y.o.   MRN: [id number]   HPI Patient presents with daughter with significant thickness of the nail bed third left and other nails that are thick on the left foot.  Patient does have Alzheimer's disease and is noncommunicative   Review of Systems  All other systems reviewed and are negative.       Objective:  Physical Exam  Constitutional: She appears well-developed and well-nourished.  Cardiovascular: Intact distal pulses.  Pulmonary/Chest: Effort normal.  Musculoskeletal: Normal range of motion.  Neurological: She is alert.  Skin: Skin is warm.  Nursing note and vitals reviewed.   Neurovascular status was found to be intact with patient found to have significant thickness of the nailbeds 1 through 5 left foot with severe thickness of the third nail left foot with dystrophic changes noted and moderate pain with palpation     Assessment:  Nail disease 1-5 left with noncommunicative type patient     Plan:  H&P condition discussed and difficult to treat this patient due to her moderate belligerent stance with Alzheimer's disease.  I was able to debride the nails on the left foot with no iatrogenic bleeding and patient will be seen back as needed for routine care

## 2017-11-08 NOTE — Telephone Encounter (Signed)
Referral placed.

## 2017-11-08 NOTE — Telephone Encounter (Signed)
Anna [name] called from [place] called at 10:10 AM to report that the pt has wounds on toes and left foot.  Requesting referral to [place].  Has appt today at 11:30 AM.  Wounds are causing pain.  Will authorize referral with [facility]

## 2017-11-09 DIAGNOSIS — I130 Hypertensive heart and chronic kidney disease with heart failure and stage 1 through stage 4 chronic kidney disease, or unspecified chronic kidney disease: Secondary | ICD-10-CM | POA: Diagnosis not present

## 2017-11-09 DIAGNOSIS — E1169 Type 2 diabetes mellitus with other specified complication: Secondary | ICD-10-CM | POA: Diagnosis not present

## 2017-11-09 DIAGNOSIS — I509 Heart failure, unspecified: Secondary | ICD-10-CM | POA: Diagnosis not present

## 2017-11-09 DIAGNOSIS — E1122 Type 2 diabetes mellitus with diabetic chronic kidney disease: Secondary | ICD-10-CM | POA: Diagnosis not present

## 2017-11-09 DIAGNOSIS — L0501 Pilonidal cyst with abscess: Secondary | ICD-10-CM | POA: Diagnosis not present

## 2017-11-09 DIAGNOSIS — N183 Chronic kidney disease, stage 3 (moderate): Secondary | ICD-10-CM | POA: Diagnosis not present

## 2017-11-09 DIAGNOSIS — F0280 Dementia in other diseases classified elsewhere without behavioral disturbance: Secondary | ICD-10-CM | POA: Diagnosis not present

## 2017-11-09 DIAGNOSIS — M4628 Osteomyelitis of vertebra, sacral and sacrococcygeal region: Secondary | ICD-10-CM | POA: Diagnosis not present

## 2017-11-09 DIAGNOSIS — G309 Alzheimer's disease, unspecified: Secondary | ICD-10-CM | POA: Diagnosis not present

## 2017-11-10 DIAGNOSIS — I130 Hypertensive heart and chronic kidney disease with heart failure and stage 1 through stage 4 chronic kidney disease, or unspecified chronic kidney disease: Secondary | ICD-10-CM | POA: Diagnosis not present

## 2017-11-10 DIAGNOSIS — N183 Chronic kidney disease, stage 3 (moderate): Secondary | ICD-10-CM | POA: Diagnosis not present

## 2017-11-10 DIAGNOSIS — I509 Heart failure, unspecified: Secondary | ICD-10-CM | POA: Diagnosis not present

## 2017-11-10 DIAGNOSIS — G309 Alzheimer's disease, unspecified: Secondary | ICD-10-CM | POA: Diagnosis not present

## 2017-11-10 DIAGNOSIS — E1169 Type 2 diabetes mellitus with other specified complication: Secondary | ICD-10-CM | POA: Diagnosis not present

## 2017-11-10 DIAGNOSIS — L0501 Pilonidal cyst with abscess: Secondary | ICD-10-CM | POA: Diagnosis not present

## 2017-11-10 DIAGNOSIS — F0280 Dementia in other diseases classified elsewhere without behavioral disturbance: Secondary | ICD-10-CM | POA: Diagnosis not present

## 2017-11-10 DIAGNOSIS — E1122 Type 2 diabetes mellitus with diabetic chronic kidney disease: Secondary | ICD-10-CM | POA: Diagnosis not present

## 2017-11-10 DIAGNOSIS — M4628 Osteomyelitis of vertebra, sacral and sacrococcygeal region: Secondary | ICD-10-CM | POA: Diagnosis not present

## 2017-11-14 DIAGNOSIS — I5033 Acute on chronic diastolic (congestive) heart failure: Secondary | ICD-10-CM | POA: Diagnosis not present

## 2017-11-14 DIAGNOSIS — G309 Alzheimer's disease, unspecified: Secondary | ICD-10-CM | POA: Diagnosis not present

## 2017-11-14 DIAGNOSIS — I509 Heart failure, unspecified: Secondary | ICD-10-CM | POA: Diagnosis not present

## 2017-11-14 DIAGNOSIS — E1122 Type 2 diabetes mellitus with diabetic chronic kidney disease: Secondary | ICD-10-CM | POA: Diagnosis not present

## 2017-11-14 DIAGNOSIS — L97521 Non-pressure chronic ulcer of other part of left foot limited to breakdown of skin: Secondary | ICD-10-CM | POA: Diagnosis not present

## 2017-11-14 DIAGNOSIS — L89891 Pressure ulcer of other site, stage 1: Secondary | ICD-10-CM | POA: Diagnosis not present

## 2017-11-14 DIAGNOSIS — E1169 Type 2 diabetes mellitus with other specified complication: Secondary | ICD-10-CM | POA: Diagnosis not present

## 2017-11-14 DIAGNOSIS — I130 Hypertensive heart and chronic kidney disease with heart failure and stage 1 through stage 4 chronic kidney disease, or unspecified chronic kidney disease: Secondary | ICD-10-CM | POA: Diagnosis not present

## 2017-11-14 DIAGNOSIS — N183 Chronic kidney disease, stage 3 (moderate): Secondary | ICD-10-CM | POA: Diagnosis not present

## 2017-11-14 DIAGNOSIS — E1129 Type 2 diabetes mellitus with other diabetic kidney complication: Secondary | ICD-10-CM | POA: Diagnosis not present

## 2017-11-14 DIAGNOSIS — R0602 Shortness of breath: Secondary | ICD-10-CM | POA: Diagnosis not present

## 2017-11-14 DIAGNOSIS — M4628 Osteomyelitis of vertebra, sacral and sacrococcygeal region: Secondary | ICD-10-CM | POA: Diagnosis not present

## 2017-11-14 DIAGNOSIS — E11621 Type 2 diabetes mellitus with foot ulcer: Secondary | ICD-10-CM | POA: Diagnosis not present

## 2017-11-15 DIAGNOSIS — E1122 Type 2 diabetes mellitus with diabetic chronic kidney disease: Secondary | ICD-10-CM | POA: Diagnosis not present

## 2017-11-15 DIAGNOSIS — I130 Hypertensive heart and chronic kidney disease with heart failure and stage 1 through stage 4 chronic kidney disease, or unspecified chronic kidney disease: Secondary | ICD-10-CM | POA: Diagnosis not present

## 2017-11-15 DIAGNOSIS — E1169 Type 2 diabetes mellitus with other specified complication: Secondary | ICD-10-CM | POA: Diagnosis not present

## 2017-11-15 DIAGNOSIS — M4628 Osteomyelitis of vertebra, sacral and sacrococcygeal region: Secondary | ICD-10-CM | POA: Diagnosis not present

## 2017-11-15 DIAGNOSIS — E11621 Type 2 diabetes mellitus with foot ulcer: Secondary | ICD-10-CM | POA: Diagnosis not present

## 2017-11-15 DIAGNOSIS — N183 Chronic kidney disease, stage 3 (moderate): Secondary | ICD-10-CM | POA: Diagnosis not present

## 2017-11-15 DIAGNOSIS — I509 Heart failure, unspecified: Secondary | ICD-10-CM | POA: Diagnosis not present

## 2017-11-15 DIAGNOSIS — L97521 Non-pressure chronic ulcer of other part of left foot limited to breakdown of skin: Secondary | ICD-10-CM | POA: Diagnosis not present

## 2017-11-15 DIAGNOSIS — L89891 Pressure ulcer of other site, stage 1: Secondary | ICD-10-CM | POA: Diagnosis not present

## 2017-11-17 DIAGNOSIS — L97521 Non-pressure chronic ulcer of other part of left foot limited to breakdown of skin: Secondary | ICD-10-CM | POA: Diagnosis not present

## 2017-11-17 DIAGNOSIS — I509 Heart failure, unspecified: Secondary | ICD-10-CM | POA: Diagnosis not present

## 2017-11-17 DIAGNOSIS — M4628 Osteomyelitis of vertebra, sacral and sacrococcygeal region: Secondary | ICD-10-CM | POA: Diagnosis not present

## 2017-11-17 DIAGNOSIS — N183 Chronic kidney disease, stage 3 (moderate): Secondary | ICD-10-CM | POA: Diagnosis not present

## 2017-11-17 DIAGNOSIS — I130 Hypertensive heart and chronic kidney disease with heart failure and stage 1 through stage 4 chronic kidney disease, or unspecified chronic kidney disease: Secondary | ICD-10-CM | POA: Diagnosis not present

## 2017-11-17 DIAGNOSIS — E11621 Type 2 diabetes mellitus with foot ulcer: Secondary | ICD-10-CM | POA: Diagnosis not present

## 2017-11-17 DIAGNOSIS — L89891 Pressure ulcer of other site, stage 1: Secondary | ICD-10-CM | POA: Diagnosis not present

## 2017-11-17 DIAGNOSIS — E1122 Type 2 diabetes mellitus with diabetic chronic kidney disease: Secondary | ICD-10-CM | POA: Diagnosis not present

## 2017-11-17 DIAGNOSIS — E1169 Type 2 diabetes mellitus with other specified complication: Secondary | ICD-10-CM | POA: Diagnosis not present

## 2017-11-20 DIAGNOSIS — I130 Hypertensive heart and chronic kidney disease with heart failure and stage 1 through stage 4 chronic kidney disease, or unspecified chronic kidney disease: Secondary | ICD-10-CM | POA: Diagnosis not present

## 2017-11-20 DIAGNOSIS — I509 Heart failure, unspecified: Secondary | ICD-10-CM | POA: Diagnosis not present

## 2017-11-20 DIAGNOSIS — L97521 Non-pressure chronic ulcer of other part of left foot limited to breakdown of skin: Secondary | ICD-10-CM | POA: Diagnosis not present

## 2017-11-20 DIAGNOSIS — E1122 Type 2 diabetes mellitus with diabetic chronic kidney disease: Secondary | ICD-10-CM | POA: Diagnosis not present

## 2017-11-20 DIAGNOSIS — M4628 Osteomyelitis of vertebra, sacral and sacrococcygeal region: Secondary | ICD-10-CM | POA: Diagnosis not present

## 2017-11-20 DIAGNOSIS — N183 Chronic kidney disease, stage 3 (moderate): Secondary | ICD-10-CM | POA: Diagnosis not present

## 2017-11-20 DIAGNOSIS — L89891 Pressure ulcer of other site, stage 1: Secondary | ICD-10-CM | POA: Diagnosis not present

## 2017-11-20 DIAGNOSIS — E1169 Type 2 diabetes mellitus with other specified complication: Secondary | ICD-10-CM | POA: Diagnosis not present

## 2017-11-20 DIAGNOSIS — E11621 Type 2 diabetes mellitus with foot ulcer: Secondary | ICD-10-CM | POA: Diagnosis not present

## 2017-11-21 ENCOUNTER — Other Ambulatory Visit: Payer: Self-pay | Admitting: Adult Health

## 2017-11-21 DIAGNOSIS — G301 Alzheimer's disease with late onset: Principal | ICD-10-CM

## 2017-11-21 DIAGNOSIS — F0280 Dementia in other diseases classified elsewhere without behavioral disturbance: Secondary | ICD-10-CM

## 2017-11-22 ENCOUNTER — Encounter (HOSPITAL_BASED_OUTPATIENT_CLINIC_OR_DEPARTMENT_OTHER): Payer: Medicare HMO | Attending: Internal Medicine

## 2017-11-22 DIAGNOSIS — I130 Hypertensive heart and chronic kidney disease with heart failure and stage 1 through stage 4 chronic kidney disease, or unspecified chronic kidney disease: Secondary | ICD-10-CM | POA: Diagnosis not present

## 2017-11-22 DIAGNOSIS — G309 Alzheimer's disease, unspecified: Secondary | ICD-10-CM | POA: Insufficient documentation

## 2017-11-22 DIAGNOSIS — N183 Chronic kidney disease, stage 3 (moderate): Secondary | ICD-10-CM | POA: Insufficient documentation

## 2017-11-22 DIAGNOSIS — E1169 Type 2 diabetes mellitus with other specified complication: Secondary | ICD-10-CM | POA: Diagnosis not present

## 2017-11-22 DIAGNOSIS — I509 Heart failure, unspecified: Secondary | ICD-10-CM | POA: Insufficient documentation

## 2017-11-22 DIAGNOSIS — L89891 Pressure ulcer of other site, stage 1: Secondary | ICD-10-CM | POA: Diagnosis not present

## 2017-11-22 DIAGNOSIS — E1122 Type 2 diabetes mellitus with diabetic chronic kidney disease: Secondary | ICD-10-CM | POA: Diagnosis not present

## 2017-11-22 DIAGNOSIS — E11622 Type 2 diabetes mellitus with other skin ulcer: Secondary | ICD-10-CM | POA: Insufficient documentation

## 2017-11-22 DIAGNOSIS — L98492 Non-pressure chronic ulcer of skin of other sites with fat layer exposed: Secondary | ICD-10-CM | POA: Insufficient documentation

## 2017-11-22 DIAGNOSIS — M4628 Osteomyelitis of vertebra, sacral and sacrococcygeal region: Secondary | ICD-10-CM | POA: Diagnosis not present

## 2017-11-22 DIAGNOSIS — Z7984 Long term (current) use of oral hypoglycemic drugs: Secondary | ICD-10-CM | POA: Insufficient documentation

## 2017-11-22 DIAGNOSIS — F0280 Dementia in other diseases classified elsewhere without behavioral disturbance: Secondary | ICD-10-CM | POA: Insufficient documentation

## 2017-11-22 DIAGNOSIS — L97521 Non-pressure chronic ulcer of other part of left foot limited to breakdown of skin: Secondary | ICD-10-CM | POA: Diagnosis not present

## 2017-11-22 DIAGNOSIS — E11621 Type 2 diabetes mellitus with foot ulcer: Secondary | ICD-10-CM | POA: Diagnosis not present

## 2017-11-24 DIAGNOSIS — E1169 Type 2 diabetes mellitus with other specified complication: Secondary | ICD-10-CM | POA: Diagnosis not present

## 2017-11-24 DIAGNOSIS — I509 Heart failure, unspecified: Secondary | ICD-10-CM | POA: Diagnosis not present

## 2017-11-24 DIAGNOSIS — I130 Hypertensive heart and chronic kidney disease with heart failure and stage 1 through stage 4 chronic kidney disease, or unspecified chronic kidney disease: Secondary | ICD-10-CM | POA: Diagnosis not present

## 2017-11-24 DIAGNOSIS — M4628 Osteomyelitis of vertebra, sacral and sacrococcygeal region: Secondary | ICD-10-CM | POA: Diagnosis not present

## 2017-11-24 DIAGNOSIS — E11621 Type 2 diabetes mellitus with foot ulcer: Secondary | ICD-10-CM | POA: Diagnosis not present

## 2017-11-24 DIAGNOSIS — L89891 Pressure ulcer of other site, stage 1: Secondary | ICD-10-CM | POA: Diagnosis not present

## 2017-11-24 DIAGNOSIS — N183 Chronic kidney disease, stage 3 (moderate): Secondary | ICD-10-CM | POA: Diagnosis not present

## 2017-11-24 DIAGNOSIS — L97521 Non-pressure chronic ulcer of other part of left foot limited to breakdown of skin: Secondary | ICD-10-CM | POA: Diagnosis not present

## 2017-11-24 DIAGNOSIS — E1122 Type 2 diabetes mellitus with diabetic chronic kidney disease: Secondary | ICD-10-CM | POA: Diagnosis not present

## 2017-11-27 DIAGNOSIS — E1169 Type 2 diabetes mellitus with other specified complication: Secondary | ICD-10-CM | POA: Diagnosis not present

## 2017-11-27 DIAGNOSIS — E1122 Type 2 diabetes mellitus with diabetic chronic kidney disease: Secondary | ICD-10-CM | POA: Diagnosis not present

## 2017-11-27 DIAGNOSIS — I509 Heart failure, unspecified: Secondary | ICD-10-CM | POA: Diagnosis not present

## 2017-11-27 DIAGNOSIS — I130 Hypertensive heart and chronic kidney disease with heart failure and stage 1 through stage 4 chronic kidney disease, or unspecified chronic kidney disease: Secondary | ICD-10-CM | POA: Diagnosis not present

## 2017-11-27 DIAGNOSIS — L97521 Non-pressure chronic ulcer of other part of left foot limited to breakdown of skin: Secondary | ICD-10-CM | POA: Diagnosis not present

## 2017-11-27 DIAGNOSIS — E11621 Type 2 diabetes mellitus with foot ulcer: Secondary | ICD-10-CM | POA: Diagnosis not present

## 2017-11-27 DIAGNOSIS — L89891 Pressure ulcer of other site, stage 1: Secondary | ICD-10-CM | POA: Diagnosis not present

## 2017-11-27 DIAGNOSIS — N183 Chronic kidney disease, stage 3 (moderate): Secondary | ICD-10-CM | POA: Diagnosis not present

## 2017-11-27 DIAGNOSIS — M4628 Osteomyelitis of vertebra, sacral and sacrococcygeal region: Secondary | ICD-10-CM | POA: Diagnosis not present

## 2017-11-29 DIAGNOSIS — Z7984 Long term (current) use of oral hypoglycemic drugs: Secondary | ICD-10-CM | POA: Diagnosis not present

## 2017-11-29 DIAGNOSIS — T8189XA Other complications of procedures, not elsewhere classified, initial encounter: Secondary | ICD-10-CM | POA: Diagnosis not present

## 2017-11-29 DIAGNOSIS — F0280 Dementia in other diseases classified elsewhere without behavioral disturbance: Secondary | ICD-10-CM | POA: Diagnosis not present

## 2017-11-29 DIAGNOSIS — I130 Hypertensive heart and chronic kidney disease with heart failure and stage 1 through stage 4 chronic kidney disease, or unspecified chronic kidney disease: Secondary | ICD-10-CM | POA: Diagnosis not present

## 2017-11-29 DIAGNOSIS — N183 Chronic kidney disease, stage 3 (moderate): Secondary | ICD-10-CM | POA: Diagnosis not present

## 2017-11-29 DIAGNOSIS — G309 Alzheimer's disease, unspecified: Secondary | ICD-10-CM | POA: Diagnosis not present

## 2017-11-29 DIAGNOSIS — E1129 Type 2 diabetes mellitus with other diabetic kidney complication: Secondary | ICD-10-CM | POA: Diagnosis not present

## 2017-11-29 DIAGNOSIS — E11622 Type 2 diabetes mellitus with other skin ulcer: Secondary | ICD-10-CM | POA: Diagnosis not present

## 2017-11-29 DIAGNOSIS — L98422 Non-pressure chronic ulcer of back with fat layer exposed: Secondary | ICD-10-CM | POA: Diagnosis not present

## 2017-11-29 DIAGNOSIS — I509 Heart failure, unspecified: Secondary | ICD-10-CM | POA: Diagnosis not present

## 2017-11-29 DIAGNOSIS — E1122 Type 2 diabetes mellitus with diabetic chronic kidney disease: Secondary | ICD-10-CM | POA: Diagnosis not present

## 2017-11-29 DIAGNOSIS — L0502 Pilonidal sinus with abscess: Secondary | ICD-10-CM | POA: Diagnosis not present

## 2017-11-29 DIAGNOSIS — L98492 Non-pressure chronic ulcer of skin of other sites with fat layer exposed: Secondary | ICD-10-CM | POA: Diagnosis not present

## 2017-12-01 DIAGNOSIS — E11621 Type 2 diabetes mellitus with foot ulcer: Secondary | ICD-10-CM | POA: Diagnosis not present

## 2017-12-01 DIAGNOSIS — L97521 Non-pressure chronic ulcer of other part of left foot limited to breakdown of skin: Secondary | ICD-10-CM | POA: Diagnosis not present

## 2017-12-01 DIAGNOSIS — L0502 Pilonidal sinus with abscess: Secondary | ICD-10-CM | POA: Diagnosis not present

## 2017-12-01 DIAGNOSIS — E1122 Type 2 diabetes mellitus with diabetic chronic kidney disease: Secondary | ICD-10-CM | POA: Diagnosis not present

## 2017-12-01 DIAGNOSIS — E1169 Type 2 diabetes mellitus with other specified complication: Secondary | ICD-10-CM | POA: Diagnosis not present

## 2017-12-01 DIAGNOSIS — E1129 Type 2 diabetes mellitus with other diabetic kidney complication: Secondary | ICD-10-CM | POA: Diagnosis not present

## 2017-12-01 DIAGNOSIS — I130 Hypertensive heart and chronic kidney disease with heart failure and stage 1 through stage 4 chronic kidney disease, or unspecified chronic kidney disease: Secondary | ICD-10-CM | POA: Diagnosis not present

## 2017-12-01 DIAGNOSIS — N183 Chronic kidney disease, stage 3 (moderate): Secondary | ICD-10-CM | POA: Diagnosis not present

## 2017-12-01 DIAGNOSIS — L89891 Pressure ulcer of other site, stage 1: Secondary | ICD-10-CM | POA: Diagnosis not present

## 2017-12-01 DIAGNOSIS — M4628 Osteomyelitis of vertebra, sacral and sacrococcygeal region: Secondary | ICD-10-CM | POA: Diagnosis not present

## 2017-12-01 DIAGNOSIS — T8189XA Other complications of procedures, not elsewhere classified, initial encounter: Secondary | ICD-10-CM | POA: Diagnosis not present

## 2017-12-01 DIAGNOSIS — I509 Heart failure, unspecified: Secondary | ICD-10-CM | POA: Diagnosis not present

## 2017-12-04 DIAGNOSIS — L89891 Pressure ulcer of other site, stage 1: Secondary | ICD-10-CM | POA: Diagnosis not present

## 2017-12-04 DIAGNOSIS — E1122 Type 2 diabetes mellitus with diabetic chronic kidney disease: Secondary | ICD-10-CM | POA: Diagnosis not present

## 2017-12-04 DIAGNOSIS — E1169 Type 2 diabetes mellitus with other specified complication: Secondary | ICD-10-CM | POA: Diagnosis not present

## 2017-12-04 DIAGNOSIS — N183 Chronic kidney disease, stage 3 (moderate): Secondary | ICD-10-CM | POA: Diagnosis not present

## 2017-12-04 DIAGNOSIS — I509 Heart failure, unspecified: Secondary | ICD-10-CM | POA: Diagnosis not present

## 2017-12-04 DIAGNOSIS — I130 Hypertensive heart and chronic kidney disease with heart failure and stage 1 through stage 4 chronic kidney disease, or unspecified chronic kidney disease: Secondary | ICD-10-CM | POA: Diagnosis not present

## 2017-12-04 DIAGNOSIS — L97521 Non-pressure chronic ulcer of other part of left foot limited to breakdown of skin: Secondary | ICD-10-CM | POA: Diagnosis not present

## 2017-12-04 DIAGNOSIS — E11621 Type 2 diabetes mellitus with foot ulcer: Secondary | ICD-10-CM | POA: Diagnosis not present

## 2017-12-04 DIAGNOSIS — M4628 Osteomyelitis of vertebra, sacral and sacrococcygeal region: Secondary | ICD-10-CM | POA: Diagnosis not present

## 2017-12-06 DIAGNOSIS — I130 Hypertensive heart and chronic kidney disease with heart failure and stage 1 through stage 4 chronic kidney disease, or unspecified chronic kidney disease: Secondary | ICD-10-CM | POA: Diagnosis not present

## 2017-12-06 DIAGNOSIS — E11621 Type 2 diabetes mellitus with foot ulcer: Secondary | ICD-10-CM | POA: Diagnosis not present

## 2017-12-06 DIAGNOSIS — E1169 Type 2 diabetes mellitus with other specified complication: Secondary | ICD-10-CM | POA: Diagnosis not present

## 2017-12-06 DIAGNOSIS — N183 Chronic kidney disease, stage 3 (moderate): Secondary | ICD-10-CM | POA: Diagnosis not present

## 2017-12-06 DIAGNOSIS — M4628 Osteomyelitis of vertebra, sacral and sacrococcygeal region: Secondary | ICD-10-CM | POA: Diagnosis not present

## 2017-12-06 DIAGNOSIS — L89891 Pressure ulcer of other site, stage 1: Secondary | ICD-10-CM | POA: Diagnosis not present

## 2017-12-06 DIAGNOSIS — E1122 Type 2 diabetes mellitus with diabetic chronic kidney disease: Secondary | ICD-10-CM | POA: Diagnosis not present

## 2017-12-06 DIAGNOSIS — I509 Heart failure, unspecified: Secondary | ICD-10-CM | POA: Diagnosis not present

## 2017-12-06 DIAGNOSIS — L97521 Non-pressure chronic ulcer of other part of left foot limited to breakdown of skin: Secondary | ICD-10-CM | POA: Diagnosis not present

## 2017-12-08 DIAGNOSIS — N183 Chronic kidney disease, stage 3 (moderate): Secondary | ICD-10-CM | POA: Diagnosis not present

## 2017-12-08 DIAGNOSIS — L97521 Non-pressure chronic ulcer of other part of left foot limited to breakdown of skin: Secondary | ICD-10-CM | POA: Diagnosis not present

## 2017-12-08 DIAGNOSIS — E1122 Type 2 diabetes mellitus with diabetic chronic kidney disease: Secondary | ICD-10-CM | POA: Diagnosis not present

## 2017-12-08 DIAGNOSIS — I509 Heart failure, unspecified: Secondary | ICD-10-CM | POA: Diagnosis not present

## 2017-12-08 DIAGNOSIS — L89891 Pressure ulcer of other site, stage 1: Secondary | ICD-10-CM | POA: Diagnosis not present

## 2017-12-08 DIAGNOSIS — M4628 Osteomyelitis of vertebra, sacral and sacrococcygeal region: Secondary | ICD-10-CM | POA: Diagnosis not present

## 2017-12-08 DIAGNOSIS — E1169 Type 2 diabetes mellitus with other specified complication: Secondary | ICD-10-CM | POA: Diagnosis not present

## 2017-12-08 DIAGNOSIS — I130 Hypertensive heart and chronic kidney disease with heart failure and stage 1 through stage 4 chronic kidney disease, or unspecified chronic kidney disease: Secondary | ICD-10-CM | POA: Diagnosis not present

## 2017-12-08 DIAGNOSIS — E11621 Type 2 diabetes mellitus with foot ulcer: Secondary | ICD-10-CM | POA: Diagnosis not present

## 2017-12-11 DIAGNOSIS — E1122 Type 2 diabetes mellitus with diabetic chronic kidney disease: Secondary | ICD-10-CM | POA: Diagnosis not present

## 2017-12-11 DIAGNOSIS — E11621 Type 2 diabetes mellitus with foot ulcer: Secondary | ICD-10-CM | POA: Diagnosis not present

## 2017-12-11 DIAGNOSIS — N183 Chronic kidney disease, stage 3 (moderate): Secondary | ICD-10-CM | POA: Diagnosis not present

## 2017-12-11 DIAGNOSIS — M4628 Osteomyelitis of vertebra, sacral and sacrococcygeal region: Secondary | ICD-10-CM | POA: Diagnosis not present

## 2017-12-11 DIAGNOSIS — I130 Hypertensive heart and chronic kidney disease with heart failure and stage 1 through stage 4 chronic kidney disease, or unspecified chronic kidney disease: Secondary | ICD-10-CM | POA: Diagnosis not present

## 2017-12-11 DIAGNOSIS — I509 Heart failure, unspecified: Secondary | ICD-10-CM | POA: Diagnosis not present

## 2017-12-11 DIAGNOSIS — L89891 Pressure ulcer of other site, stage 1: Secondary | ICD-10-CM | POA: Diagnosis not present

## 2017-12-11 DIAGNOSIS — L97521 Non-pressure chronic ulcer of other part of left foot limited to breakdown of skin: Secondary | ICD-10-CM | POA: Diagnosis not present

## 2017-12-11 DIAGNOSIS — E1169 Type 2 diabetes mellitus with other specified complication: Secondary | ICD-10-CM | POA: Diagnosis not present

## 2017-12-12 ENCOUNTER — Telehealth: Payer: Self-pay | Admitting: Adult Health

## 2017-12-12 NOTE — Telephone Encounter (Signed)
Copied from [place] [id number]. Topic: Quick Communication - Rx Refill/Question >> Dec 12, 2017  4:14 PM Arletha Grippe wrote: Medication: hydrocodone 5mg   Has the patient contacted their pharmacy? No. (Agent: If no, request that the patient contact the pharmacy for the refill.) Preferred Pharmacy (with phone number or street name): [facility]  Agent: Please be advised that RX refills may take up to 3 business days. We ask that you follow-up with your pharmacy. Daughter is calling - she has changed the dosage to 0.5 tab 3 times a day.,  she has enough to get to Sunday. She started this last week.  Pt still on the woundbac machine

## 2017-12-13 DIAGNOSIS — E1169 Type 2 diabetes mellitus with other specified complication: Secondary | ICD-10-CM | POA: Diagnosis not present

## 2017-12-13 DIAGNOSIS — I130 Hypertensive heart and chronic kidney disease with heart failure and stage 1 through stage 4 chronic kidney disease, or unspecified chronic kidney disease: Secondary | ICD-10-CM | POA: Diagnosis not present

## 2017-12-13 DIAGNOSIS — E1129 Type 2 diabetes mellitus with other diabetic kidney complication: Secondary | ICD-10-CM | POA: Diagnosis not present

## 2017-12-13 DIAGNOSIS — L89891 Pressure ulcer of other site, stage 1: Secondary | ICD-10-CM | POA: Diagnosis not present

## 2017-12-13 DIAGNOSIS — M4628 Osteomyelitis of vertebra, sacral and sacrococcygeal region: Secondary | ICD-10-CM | POA: Diagnosis not present

## 2017-12-13 DIAGNOSIS — I509 Heart failure, unspecified: Secondary | ICD-10-CM | POA: Diagnosis not present

## 2017-12-13 DIAGNOSIS — N183 Chronic kidney disease, stage 3 (moderate): Secondary | ICD-10-CM | POA: Diagnosis not present

## 2017-12-13 DIAGNOSIS — E11621 Type 2 diabetes mellitus with foot ulcer: Secondary | ICD-10-CM | POA: Diagnosis not present

## 2017-12-13 DIAGNOSIS — L0502 Pilonidal sinus with abscess: Secondary | ICD-10-CM | POA: Diagnosis not present

## 2017-12-13 DIAGNOSIS — L97521 Non-pressure chronic ulcer of other part of left foot limited to breakdown of skin: Secondary | ICD-10-CM | POA: Diagnosis not present

## 2017-12-13 DIAGNOSIS — T8189XA Other complications of procedures, not elsewhere classified, initial encounter: Secondary | ICD-10-CM | POA: Diagnosis not present

## 2017-12-13 DIAGNOSIS — E1122 Type 2 diabetes mellitus with diabetic chronic kidney disease: Secondary | ICD-10-CM | POA: Diagnosis not present

## 2017-12-13 NOTE — Telephone Encounter (Signed)
LOV 08/30/17 Cory Nafziger Requesting a refill of Hydrocodone - medication is not on med list. Unable to contact pt. Or family.

## 2017-12-14 ENCOUNTER — Other Ambulatory Visit: Payer: Self-pay | Admitting: Adult Health

## 2017-12-14 DIAGNOSIS — D649 Anemia, unspecified: Secondary | ICD-10-CM

## 2017-12-14 MED ORDER — HYDROCODONE-ACETAMINOPHEN 5-325 MG PO TABS: 1.0000 | ORAL_TABLET | Freq: Three times a day (TID) | ORAL | 0 refills | Status: DC | PRN

## 2017-12-14 NOTE — Telephone Encounter (Signed)
Labs entered. Ok to schedule appointment

## 2017-12-14 NOTE — Telephone Encounter (Signed)
Pt now scheduled for Tues 12/19/17 @ 2:30 PM.

## 2017-12-14 NOTE — Telephone Encounter (Signed)
Sent to the pharmacy.  [name] notified that she will need a pain management visit before further refills.  [name] did mention that she would like pt's labs checked.  More focused on pt's iron since the supplement has been stopped.  Hemoglobin was slightly elevated with labs on 06/22/17.  Ok to order lab work and make lab appt?

## 2017-12-14 NOTE — Telephone Encounter (Signed)
I have sent in 30 pills. Since she is on chronic pain medications. She will need to come in for  Pain management visit in order to have more pain medication

## 2017-12-15 DIAGNOSIS — I130 Hypertensive heart and chronic kidney disease with heart failure and stage 1 through stage 4 chronic kidney disease, or unspecified chronic kidney disease: Secondary | ICD-10-CM | POA: Diagnosis not present

## 2017-12-15 DIAGNOSIS — E1129 Type 2 diabetes mellitus with other diabetic kidney complication: Secondary | ICD-10-CM | POA: Diagnosis not present

## 2017-12-15 DIAGNOSIS — E11621 Type 2 diabetes mellitus with foot ulcer: Secondary | ICD-10-CM | POA: Diagnosis not present

## 2017-12-15 DIAGNOSIS — E1169 Type 2 diabetes mellitus with other specified complication: Secondary | ICD-10-CM | POA: Diagnosis not present

## 2017-12-15 DIAGNOSIS — N183 Chronic kidney disease, stage 3 (moderate): Secondary | ICD-10-CM | POA: Diagnosis not present

## 2017-12-15 DIAGNOSIS — E1122 Type 2 diabetes mellitus with diabetic chronic kidney disease: Secondary | ICD-10-CM | POA: Diagnosis not present

## 2017-12-15 DIAGNOSIS — L97521 Non-pressure chronic ulcer of other part of left foot limited to breakdown of skin: Secondary | ICD-10-CM | POA: Diagnosis not present

## 2017-12-15 DIAGNOSIS — I509 Heart failure, unspecified: Secondary | ICD-10-CM | POA: Diagnosis not present

## 2017-12-15 DIAGNOSIS — R0602 Shortness of breath: Secondary | ICD-10-CM | POA: Diagnosis not present

## 2017-12-15 DIAGNOSIS — L89891 Pressure ulcer of other site, stage 1: Secondary | ICD-10-CM | POA: Diagnosis not present

## 2017-12-15 DIAGNOSIS — I5033 Acute on chronic diastolic (congestive) heart failure: Secondary | ICD-10-CM | POA: Diagnosis not present

## 2017-12-15 DIAGNOSIS — M4628 Osteomyelitis of vertebra, sacral and sacrococcygeal region: Secondary | ICD-10-CM | POA: Diagnosis not present

## 2017-12-15 DIAGNOSIS — G309 Alzheimer's disease, unspecified: Secondary | ICD-10-CM | POA: Diagnosis not present

## 2017-12-18 ENCOUNTER — Other Ambulatory Visit: Payer: Self-pay | Admitting: Adult Health

## 2017-12-18 DIAGNOSIS — F0280 Dementia in other diseases classified elsewhere without behavioral disturbance: Secondary | ICD-10-CM

## 2017-12-18 DIAGNOSIS — E1122 Type 2 diabetes mellitus with diabetic chronic kidney disease: Secondary | ICD-10-CM | POA: Diagnosis not present

## 2017-12-18 DIAGNOSIS — G301 Alzheimer's disease with late onset: Principal | ICD-10-CM

## 2017-12-18 DIAGNOSIS — N183 Chronic kidney disease, stage 3 (moderate): Secondary | ICD-10-CM | POA: Diagnosis not present

## 2017-12-18 DIAGNOSIS — E11621 Type 2 diabetes mellitus with foot ulcer: Secondary | ICD-10-CM | POA: Diagnosis not present

## 2017-12-18 DIAGNOSIS — I509 Heart failure, unspecified: Secondary | ICD-10-CM | POA: Diagnosis not present

## 2017-12-18 DIAGNOSIS — L97521 Non-pressure chronic ulcer of other part of left foot limited to breakdown of skin: Secondary | ICD-10-CM | POA: Diagnosis not present

## 2017-12-18 DIAGNOSIS — E1169 Type 2 diabetes mellitus with other specified complication: Secondary | ICD-10-CM | POA: Diagnosis not present

## 2017-12-18 DIAGNOSIS — M4628 Osteomyelitis of vertebra, sacral and sacrococcygeal region: Secondary | ICD-10-CM | POA: Diagnosis not present

## 2017-12-18 DIAGNOSIS — L89891 Pressure ulcer of other site, stage 1: Secondary | ICD-10-CM | POA: Diagnosis not present

## 2017-12-18 DIAGNOSIS — I130 Hypertensive heart and chronic kidney disease with heart failure and stage 1 through stage 4 chronic kidney disease, or unspecified chronic kidney disease: Secondary | ICD-10-CM | POA: Diagnosis not present

## 2017-12-19 ENCOUNTER — Other Ambulatory Visit (INDEPENDENT_AMBULATORY_CARE_PROVIDER_SITE_OTHER): Payer: Medicare HMO

## 2017-12-19 DIAGNOSIS — D649 Anemia, unspecified: Secondary | ICD-10-CM

## 2017-12-19 DIAGNOSIS — I10 Essential (primary) hypertension: Secondary | ICD-10-CM | POA: Diagnosis not present

## 2017-12-19 LAB — CBC WITH DIFFERENTIAL/PLATELET
BASOS ABS: 0 10*3/uL (ref 0.0–0.1)
Basophils Relative: 0.6 % (ref 0.0–3.0)
EOS PCT: 2 % (ref 0.0–5.0)
Eosinophils Absolute: 0.2 10*3/uL (ref 0.0–0.7)
HEMATOCRIT: 45.1 % (ref 36.0–46.0)
Hemoglobin: 14.8 g/dL (ref 12.0–15.0)
LYMPHS ABS: 3.1 10*3/uL (ref 0.7–4.0)
LYMPHS PCT: 37 % (ref 12.0–46.0)
MCHC: 32.8 g/dL (ref 30.0–36.0)
MCV: 89.6 fl (ref 78.0–100.0)
Monocytes Absolute: 0.6 10*3/uL (ref 0.1–1.0)
Monocytes Relative: 7 % (ref 3.0–12.0)
NEUTROS ABS: 4.5 10*3/uL (ref 1.4–7.7)
NEUTROS PCT: 53.4 % (ref 43.0–77.0)
Platelets: 295 10*3/uL (ref 150.0–400.0)
RBC: 5.03 Mil/uL (ref 3.87–5.11)
RDW: 15.4 % (ref 11.5–15.5)
WBC: 8.4 10*3/uL (ref 4.0–10.5)

## 2017-12-19 LAB — BASIC METABOLIC PANEL
BUN: 23 mg/dL (ref 6–23)
CALCIUM: 9.9 mg/dL (ref 8.4–10.5)
CHLORIDE: 104 meq/L (ref 96–112)
CO2: 26 meq/L (ref 19–32)
Creatinine, Ser: 1.05 mg/dL (ref 0.40–1.20)
GFR: 52.68 mL/min — ABNORMAL LOW (ref >60.00)
Glucose, Bld: 141 mg/dL — ABNORMAL HIGH (ref 70–99)
Potassium: 4.1 mEq/L (ref 3.5–5.1)
Sodium: 141 mEq/L (ref 135–145)

## 2017-12-19 LAB — FERRITIN: FERRITIN: 41.6 ng/mL (ref 10.0–291.0)

## 2017-12-20 ENCOUNTER — Encounter: Payer: Self-pay | Admitting: Family Medicine

## 2017-12-20 ENCOUNTER — Encounter (HOSPITAL_BASED_OUTPATIENT_CLINIC_OR_DEPARTMENT_OTHER): Payer: Medicare HMO | Attending: Physician Assistant

## 2017-12-20 DIAGNOSIS — E1136 Type 2 diabetes mellitus with diabetic cataract: Secondary | ICD-10-CM | POA: Insufficient documentation

## 2017-12-20 DIAGNOSIS — G309 Alzheimer's disease, unspecified: Secondary | ICD-10-CM | POA: Diagnosis not present

## 2017-12-20 DIAGNOSIS — I130 Hypertensive heart and chronic kidney disease with heart failure and stage 1 through stage 4 chronic kidney disease, or unspecified chronic kidney disease: Secondary | ICD-10-CM | POA: Diagnosis not present

## 2017-12-20 DIAGNOSIS — E11622 Type 2 diabetes mellitus with other skin ulcer: Secondary | ICD-10-CM | POA: Diagnosis not present

## 2017-12-20 DIAGNOSIS — E1122 Type 2 diabetes mellitus with diabetic chronic kidney disease: Secondary | ICD-10-CM | POA: Insufficient documentation

## 2017-12-20 DIAGNOSIS — L98492 Non-pressure chronic ulcer of skin of other sites with fat layer exposed: Secondary | ICD-10-CM | POA: Insufficient documentation

## 2017-12-20 DIAGNOSIS — Z7984 Long term (current) use of oral hypoglycemic drugs: Secondary | ICD-10-CM | POA: Diagnosis not present

## 2017-12-20 DIAGNOSIS — N183 Chronic kidney disease, stage 3 (moderate): Secondary | ICD-10-CM | POA: Diagnosis not present

## 2017-12-20 DIAGNOSIS — F0280 Dementia in other diseases classified elsewhere without behavioral disturbance: Secondary | ICD-10-CM | POA: Diagnosis not present

## 2017-12-20 DIAGNOSIS — I509 Heart failure, unspecified: Secondary | ICD-10-CM | POA: Diagnosis not present

## 2017-12-20 DIAGNOSIS — L98422 Non-pressure chronic ulcer of back with fat layer exposed: Secondary | ICD-10-CM | POA: Diagnosis not present

## 2017-12-22 DIAGNOSIS — I130 Hypertensive heart and chronic kidney disease with heart failure and stage 1 through stage 4 chronic kidney disease, or unspecified chronic kidney disease: Secondary | ICD-10-CM | POA: Diagnosis not present

## 2017-12-22 DIAGNOSIS — E1169 Type 2 diabetes mellitus with other specified complication: Secondary | ICD-10-CM | POA: Diagnosis not present

## 2017-12-22 DIAGNOSIS — I509 Heart failure, unspecified: Secondary | ICD-10-CM | POA: Diagnosis not present

## 2017-12-22 DIAGNOSIS — L89891 Pressure ulcer of other site, stage 1: Secondary | ICD-10-CM | POA: Diagnosis not present

## 2017-12-22 DIAGNOSIS — N183 Chronic kidney disease, stage 3 (moderate): Secondary | ICD-10-CM | POA: Diagnosis not present

## 2017-12-22 DIAGNOSIS — M4628 Osteomyelitis of vertebra, sacral and sacrococcygeal region: Secondary | ICD-10-CM | POA: Diagnosis not present

## 2017-12-22 DIAGNOSIS — E1122 Type 2 diabetes mellitus with diabetic chronic kidney disease: Secondary | ICD-10-CM | POA: Diagnosis not present

## 2017-12-22 DIAGNOSIS — L97521 Non-pressure chronic ulcer of other part of left foot limited to breakdown of skin: Secondary | ICD-10-CM | POA: Diagnosis not present

## 2017-12-22 DIAGNOSIS — E11621 Type 2 diabetes mellitus with foot ulcer: Secondary | ICD-10-CM | POA: Diagnosis not present

## 2017-12-25 DIAGNOSIS — I130 Hypertensive heart and chronic kidney disease with heart failure and stage 1 through stage 4 chronic kidney disease, or unspecified chronic kidney disease: Secondary | ICD-10-CM | POA: Diagnosis not present

## 2017-12-25 DIAGNOSIS — I509 Heart failure, unspecified: Secondary | ICD-10-CM | POA: Diagnosis not present

## 2017-12-25 DIAGNOSIS — E1122 Type 2 diabetes mellitus with diabetic chronic kidney disease: Secondary | ICD-10-CM | POA: Diagnosis not present

## 2017-12-25 DIAGNOSIS — E11621 Type 2 diabetes mellitus with foot ulcer: Secondary | ICD-10-CM | POA: Diagnosis not present

## 2017-12-25 DIAGNOSIS — L97521 Non-pressure chronic ulcer of other part of left foot limited to breakdown of skin: Secondary | ICD-10-CM | POA: Diagnosis not present

## 2017-12-25 DIAGNOSIS — E1169 Type 2 diabetes mellitus with other specified complication: Secondary | ICD-10-CM | POA: Diagnosis not present

## 2017-12-25 DIAGNOSIS — L89891 Pressure ulcer of other site, stage 1: Secondary | ICD-10-CM | POA: Diagnosis not present

## 2017-12-25 DIAGNOSIS — M4628 Osteomyelitis of vertebra, sacral and sacrococcygeal region: Secondary | ICD-10-CM | POA: Diagnosis not present

## 2017-12-25 DIAGNOSIS — N183 Chronic kidney disease, stage 3 (moderate): Secondary | ICD-10-CM | POA: Diagnosis not present

## 2017-12-27 DIAGNOSIS — N183 Chronic kidney disease, stage 3 (moderate): Secondary | ICD-10-CM | POA: Diagnosis not present

## 2017-12-27 DIAGNOSIS — E11621 Type 2 diabetes mellitus with foot ulcer: Secondary | ICD-10-CM | POA: Diagnosis not present

## 2017-12-27 DIAGNOSIS — I130 Hypertensive heart and chronic kidney disease with heart failure and stage 1 through stage 4 chronic kidney disease, or unspecified chronic kidney disease: Secondary | ICD-10-CM | POA: Diagnosis not present

## 2017-12-27 DIAGNOSIS — L97521 Non-pressure chronic ulcer of other part of left foot limited to breakdown of skin: Secondary | ICD-10-CM | POA: Diagnosis not present

## 2017-12-27 DIAGNOSIS — E1122 Type 2 diabetes mellitus with diabetic chronic kidney disease: Secondary | ICD-10-CM | POA: Diagnosis not present

## 2017-12-27 DIAGNOSIS — M4628 Osteomyelitis of vertebra, sacral and sacrococcygeal region: Secondary | ICD-10-CM | POA: Diagnosis not present

## 2017-12-27 DIAGNOSIS — L89891 Pressure ulcer of other site, stage 1: Secondary | ICD-10-CM | POA: Diagnosis not present

## 2017-12-27 DIAGNOSIS — E1169 Type 2 diabetes mellitus with other specified complication: Secondary | ICD-10-CM | POA: Diagnosis not present

## 2017-12-27 DIAGNOSIS — I509 Heart failure, unspecified: Secondary | ICD-10-CM | POA: Diagnosis not present

## 2017-12-29 DIAGNOSIS — E1169 Type 2 diabetes mellitus with other specified complication: Secondary | ICD-10-CM | POA: Diagnosis not present

## 2017-12-29 DIAGNOSIS — E11621 Type 2 diabetes mellitus with foot ulcer: Secondary | ICD-10-CM | POA: Diagnosis not present

## 2017-12-29 DIAGNOSIS — L97521 Non-pressure chronic ulcer of other part of left foot limited to breakdown of skin: Secondary | ICD-10-CM | POA: Diagnosis not present

## 2017-12-29 DIAGNOSIS — I509 Heart failure, unspecified: Secondary | ICD-10-CM | POA: Diagnosis not present

## 2017-12-29 DIAGNOSIS — N183 Chronic kidney disease, stage 3 (moderate): Secondary | ICD-10-CM | POA: Diagnosis not present

## 2017-12-29 DIAGNOSIS — L89891 Pressure ulcer of other site, stage 1: Secondary | ICD-10-CM | POA: Diagnosis not present

## 2017-12-29 DIAGNOSIS — E1122 Type 2 diabetes mellitus with diabetic chronic kidney disease: Secondary | ICD-10-CM | POA: Diagnosis not present

## 2017-12-29 DIAGNOSIS — M4628 Osteomyelitis of vertebra, sacral and sacrococcygeal region: Secondary | ICD-10-CM | POA: Diagnosis not present

## 2017-12-29 DIAGNOSIS — I130 Hypertensive heart and chronic kidney disease with heart failure and stage 1 through stage 4 chronic kidney disease, or unspecified chronic kidney disease: Secondary | ICD-10-CM | POA: Diagnosis not present

## 2017-12-31 DIAGNOSIS — I509 Heart failure, unspecified: Secondary | ICD-10-CM | POA: Diagnosis not present

## 2017-12-31 DIAGNOSIS — E1122 Type 2 diabetes mellitus with diabetic chronic kidney disease: Secondary | ICD-10-CM | POA: Diagnosis not present

## 2017-12-31 DIAGNOSIS — L89891 Pressure ulcer of other site, stage 1: Secondary | ICD-10-CM | POA: Diagnosis not present

## 2017-12-31 DIAGNOSIS — I130 Hypertensive heart and chronic kidney disease with heart failure and stage 1 through stage 4 chronic kidney disease, or unspecified chronic kidney disease: Secondary | ICD-10-CM | POA: Diagnosis not present

## 2017-12-31 DIAGNOSIS — M4628 Osteomyelitis of vertebra, sacral and sacrococcygeal region: Secondary | ICD-10-CM | POA: Diagnosis not present

## 2017-12-31 DIAGNOSIS — E1169 Type 2 diabetes mellitus with other specified complication: Secondary | ICD-10-CM | POA: Diagnosis not present

## 2017-12-31 DIAGNOSIS — E11621 Type 2 diabetes mellitus with foot ulcer: Secondary | ICD-10-CM | POA: Diagnosis not present

## 2017-12-31 DIAGNOSIS — L97521 Non-pressure chronic ulcer of other part of left foot limited to breakdown of skin: Secondary | ICD-10-CM | POA: Diagnosis not present

## 2017-12-31 DIAGNOSIS — N183 Chronic kidney disease, stage 3 (moderate): Secondary | ICD-10-CM | POA: Diagnosis not present

## 2018-01-03 ENCOUNTER — Encounter: Payer: Self-pay | Admitting: Family Medicine

## 2018-01-03 ENCOUNTER — Ambulatory Visit (INDEPENDENT_AMBULATORY_CARE_PROVIDER_SITE_OTHER): Payer: Medicare HMO | Admitting: Adult Health

## 2018-01-03 ENCOUNTER — Encounter: Payer: Self-pay | Admitting: Adult Health

## 2018-01-03 ENCOUNTER — Ambulatory Visit: Payer: Medicare HMO | Admitting: Adult Health

## 2018-01-03 VITALS — BP 146/64 | HR 84 | Temp 97.5°F

## 2018-01-03 DIAGNOSIS — E1169 Type 2 diabetes mellitus with other specified complication: Secondary | ICD-10-CM | POA: Diagnosis not present

## 2018-01-03 DIAGNOSIS — E1122 Type 2 diabetes mellitus with diabetic chronic kidney disease: Secondary | ICD-10-CM | POA: Diagnosis not present

## 2018-01-03 DIAGNOSIS — M4628 Osteomyelitis of vertebra, sacral and sacrococcygeal region: Secondary | ICD-10-CM | POA: Diagnosis not present

## 2018-01-03 DIAGNOSIS — I130 Hypertensive heart and chronic kidney disease with heart failure and stage 1 through stage 4 chronic kidney disease, or unspecified chronic kidney disease: Secondary | ICD-10-CM | POA: Diagnosis not present

## 2018-01-03 DIAGNOSIS — Z5181 Encounter for therapeutic drug level monitoring: Secondary | ICD-10-CM

## 2018-01-03 DIAGNOSIS — L89891 Pressure ulcer of other site, stage 1: Secondary | ICD-10-CM | POA: Diagnosis not present

## 2018-01-03 DIAGNOSIS — L97521 Non-pressure chronic ulcer of other part of left foot limited to breakdown of skin: Secondary | ICD-10-CM | POA: Diagnosis not present

## 2018-01-03 DIAGNOSIS — E11621 Type 2 diabetes mellitus with foot ulcer: Secondary | ICD-10-CM | POA: Diagnosis not present

## 2018-01-03 DIAGNOSIS — I509 Heart failure, unspecified: Secondary | ICD-10-CM | POA: Diagnosis not present

## 2018-01-03 DIAGNOSIS — N183 Chronic kidney disease, stage 3 (moderate): Secondary | ICD-10-CM | POA: Diagnosis not present

## 2018-01-03 NOTE — Progress Notes (Signed)
Subjective:    Patient ID: Anna [name], female    DOB: May 06, 1929, 84 y.o.   MRN: [id number]  HPI   84 year old  has a past medical history of Alzheimer's disease, Atrial fibrillation ([place]), Bronchitis, CHF (congestive heart failure) ([place]), Chronic renal insufficiency, stage III (moderate) ([place]), Diabetes mellitus without complication ([place]), Dyspnea, GERD (gastroesophageal reflux disease), GI bleeding (11/2013), Hyperlipidemia, Hypertension, Hyperthyroidism, LBBB (left bundle branch block), Overactive bladder, Overweight, Persistent atrial fibrillation ([place]), Pneumonia, Thyroid mass, UTI (urinary tract infection), Vitamin B12 deficiency, and Vitamin D deficiency.  She presents to the office today for pain management appointment.   Indication for chronic opioid: Sacral wound  Medication and dose: Norco 5-325 mg; 1 tab every 8 hours. Family has been cutting her back to 1/2 tab BID.  # pills per month: 30  Last UDS date: N/A - will do today  Opioid Treatment Agreement signed (Y/N): Y  Opioid Treatment Agreement last reviewed with patient:  01/03/2018 NCCSRS reviewed this encounter (include red flags):  01/03/2018. No red flags.   Review of Systems  Unable to perform ROS: Dementia   Past Medical History:  Diagnosis Date  . Alzheimer's disease   . Atrial fibrillation ([place])   . Bronchitis   . CHF (congestive heart failure) ([place])   . Chronic renal insufficiency, stage III (moderate) (HCC)   . Diabetes mellitus without complication ([place])   . Dyspnea    with exertion  . GERD (gastroesophageal reflux disease)   . GI bleeding 11/2013   due to supratherapeutic INR  . Hyperlipidemia   . Hypertension   . Hyperthyroidism    on amiodarone  . LBBB (left bundle branch block)   . Overactive bladder   . Overweight   . Persistent atrial fibrillation ([place])   . Pneumonia   . Thyroid mass    LLL  . UTI (urinary tract infection)   . Vitamin B12 deficiency   . Vitamin D deficiency      Social History   Socioeconomic History  . Marital status: Divorced    Spouse name: Not on file  . Number of children: Not on file  . Years of education: Not on file  . Highest education level: Not on file  Occupational History  . Not on file  Social Needs  . Financial resource strain: Not very hard  . Food insecurity:    Worry: Never true    Inability: Never true  . Transportation needs:    Medical: No    Non-medical: No  Tobacco Use  . Smoking status: Never Smoker  . Smokeless tobacco: Never Used  Substance and Sexual Activity  . Alcohol use: No    Alcohol/week: 0.0 oz  . Drug use: No  . Sexual activity: Not on file  Lifestyle  . Physical activity:    Days per week: 0 days    Minutes per session: 0 min  . Stress: Only a little  Relationships  . Social connections:    Talks on phone: Patient refused    Gets together: Patient refused    Attends religious service: Patient refused    Active member of club or organization: No    Attends meetings of clubs or organizations: Never    Relationship status: Patient refused  . Intimate partner violence:    Fear of current or ex partner: Patient refused    Emotionally abused: Patient refused    Physically abused: Patient refused    Forced sexual activity: Patient refused  Other Topics Concern  . Not on file  Social History Narrative   Pt recently moved to [place] with daughter from [place]    Past Surgical History:  Procedure Laterality Date  . ABDOMINAL HYSTERECTOMY    . ESOPHAGOGASTRODUODENOSCOPY (EGD) WITH PROPOFOL N/A 02/16/2017   Procedure: ESOPHAGOGASTRODUODENOSCOPY (EGD) WITH PROPOFOL;  Surgeon: Irene Shipper, MD;  Location: [facility] ENDOSCOPY;  Service: Endoscopy;  Laterality: N/A;  . IR FLUORO GUIDE CV MIDLINE PICC RIGHT  07/21/2017  . IR US GUIDE VASC ACCESS RIGHT  07/21/2017  . IRRIGATION AND DEBRIDEMENT ABSCESS N/A 07/11/2017   Procedure: IRRIGATION AND DEBRIDEMENT OF SACRAL ABSCESS;  Surgeon: Ralene Ok, MD;  Location: [facility] ORS;  Service: General;  Laterality: N/A;  SACRUM    Family History  Problem Relation Age of Onset  . Heart disease Father   . Diabetes Mother   . Thyroid disease Neg Hx     Allergies  Allergen Reactions  . Pollen Extract     Sneezing, watery eyes    Current Outpatient Medications on File Prior to Visit  Medication Sig Dispense Refill  . ACCU-CHEK AVIVA PLUS test strip PATIENT TO CHECK SUGARS ONCE DAILY.  3  . acetaminophen (TYLENOL) 500 MG tablet Take 500 mg by mouth every 6 (six) hours as needed for fever.     Marland Kitchen aspirin 325 MG tablet Take 325 mg by mouth daily.    . calcitRIOL (ROCALTROL) 0.5 MCG capsule Take 1 capsule (0.5 mcg total) by mouth 3 (three) times a week. Monday, Wednesday, and Friday only 36 capsule 3  . carbamide peroxide (DEBROX) 6.5 % OTIC solution Place 5 drops into both ears 2 (two) times daily. (Patient taking differently: Place 5 drops 2 (two) times daily as needed into both ears (irritation). ) 15 mL 1  . cholecalciferol (VITAMIN D) 1000 units tablet Take 1,000 Units by mouth daily.    Marland Kitchen docusate sodium (COLACE) 100 MG capsule Take 100 mg by mouth daily as needed for mild constipation.    Marland Kitchen donepezil (ARICEPT) 10 MG tablet TAKE 1 TABLET ONCE DAILY. 90 tablet 3  . fluconazole (DIFLUCAN) 100 MG tablet Take 1 tablet (100 mg total) by mouth daily. 10 tablet 3  . fluticasone (FLONASE) 50 MCG/ACT nasal spray Place 2 sprays into both nostrils daily.    . furosemide (LASIX) 20 MG tablet Take 1.5 tablets (30 mg total) by mouth daily. Take an extra 0.5 tablet daily as needed. 60 tablet 5  . glimepiride (AMARYL) 2 MG tablet TAKE 1 TABLET ONCE DAILY. (Patient taking differently: TAKE 0.5 TABLET ONCE DAILY) 90 tablet 3  . HYDROcodone-acetaminophen (NORCO) 5-325 MG tablet Take 1 tablet by mouth every 8 (eight) hours as needed for moderate pain. 30 tablet 0  . Inulin (FIBER CHOICE PO) Take 1 capsule by mouth daily.    . Iron-FA-B Cmp-C-Biot-Probiotic  (FUSION PLUS) CAPS Take 1 tablet by mouth daily. (Patient taking differently: Take 1 tablet once a week by mouth. ) 90 capsule 3  . loratadine (ALAVERT) 10 MG tablet Take 5 mg daily by mouth.     . memantine (NAMENDA) 5 MG tablet TAKE ONE TABLET AT BEDTIME. 90 tablet 1  . metoprolol tartrate (LOPRESSOR) 25 MG tablet Take 1 tablet (25 mg total) by mouth 2 (two) times daily. 60 tablet 0  . mirabegron ER (MYRBETRIQ) 25 MG TB24 tablet Take 25 mg by mouth daily.    Marland Kitchen omeprazole (PRILOSEC) 40 MG capsule Take 30- 60 min before your first and last  meals of the day (Patient taking differently: Take 40 mg daily by mouth. Take 30- 60 min before your first and last meals of the day) 60 capsule 11  . Polyethyl Glycol-Propyl Glycol (SYSTANE) 0.4-0.3 % SOLN Apply 1 drop to eye 2 (two) times daily as needed (dry eyes).    . polyethylene glycol (MIRALAX / GLYCOLAX) packet Take 17 g by mouth daily as needed for mild constipation, moderate constipation or severe constipation.    . potassium chloride SA (K-DUR,KLOR-CON) 20 MEQ tablet TAKE 1 TABLET ONCE DAILY. 90 tablet 0  . saccharomyces boulardii (FLORASTOR) 250 MG capsule Take 250 mg by mouth 2 (two) times daily.    . vitamin B-12 (CYANOCOBALAMIN) 500 MCG tablet Take 500 mcg by mouth daily.     Current Facility-Administered Medications on File Prior to Visit  Medication Dose Route Frequency Provider Last Rate Last Dose  . lidocaine (PF) (XYLOCAINE) 1 % injection 2 mL  2 mL Other Once Magnus Sinning, MD        BP (!) 146/64 (BP Location: Left Arm)   Pulse 84       Objective:   Physical Exam  Constitutional: She is oriented to person, place, and time. She appears well-developed and well-nourished. No distress.  Cardiovascular: Normal rate, regular rhythm, normal heart sounds and intact distal pulses. Exam reveals no gallop and no friction rub.  No murmur heard. Pulmonary/Chest: Effort normal and breath sounds normal. No stridor. No respiratory distress. She  has no wheezes. She has no rales. She exhibits no tenderness.  Neurological: She is alert and oriented to person, place, and time.  Skin: Skin is warm and dry. Capillary refill takes less than 2 seconds. She is not diaphoretic.  Wound vac applied    Psychiatric: She has a normal mood and affect. Her behavior is normal. Judgment and thought content normal.  Nursing note and vitals reviewed.     Assessment & Plan:  1. Therapeutic drug monitoring - unable to urinate in the office. Will get drug panel via serum.  - Drug Screen 11 w/Conf, Ser - Ok for family to continue to wean medication as appropriate - Follow up in three months  - Pain contract signed and scanned into chart   Dorothyann Peng, NP

## 2018-01-03 NOTE — Progress Notes (Deleted)
Subjective:    Patient ID: Anna [name], female    DOB: Jan 11, 1929, 84 y.o.   MRN: [id number]  HPI   Patient presents to the clinic today for pain management visit.   Indication for chronic opioid: Sacral Wound  Medication and dose: Norco 5-325 mg  # pills per month: 30 Last UDS date: Unknown Opioid Treatment Agreement signed (Y/N): Yes Opioid Treatment Agreement last reviewed with patient:  01/03/2018 NCCSRS reviewed this encounter (include red flags):  01/03/2018 - no red flags   Review of Systems See HPI   Past Medical History:  Diagnosis Date  . Alzheimer's disease   . Atrial fibrillation ([place])   . Bronchitis   . CHF (congestive heart failure) ([place])   . Chronic renal insufficiency, stage III (moderate) (HCC)   . Diabetes mellitus without complication ([place])   . Dyspnea    with exertion  . GERD (gastroesophageal reflux disease)   . GI bleeding 11/2013   due to supratherapeutic INR  . Hyperlipidemia   . Hypertension   . Hyperthyroidism    on amiodarone  . LBBB (left bundle branch block)   . Overactive bladder   . Overweight   . Persistent atrial fibrillation ([place])   . Pneumonia   . Thyroid mass    LLL  . UTI (urinary tract infection)   . Vitamin B12 deficiency   . Vitamin D deficiency     Social History   Socioeconomic History  . Marital status: Divorced    Spouse name: Not on file  . Number of children: Not on file  . Years of education: Not on file  . Highest education level: Not on file  Occupational History  . Not on file  Social Needs  . Financial resource strain: Not very hard  . Food insecurity:    Worry: Never true    Inability: Never true  . Transportation needs:    Medical: No    Non-medical: No  Tobacco Use  . Smoking status: Never Smoker  . Smokeless tobacco: Never Used  Substance and Sexual Activity  . Alcohol use: No    Alcohol/week: 0.0 oz  . Drug use: No  . Sexual activity: Not on file  Lifestyle  . Physical activity:   Days per week: 0 days    Minutes per session: 0 min  . Stress: Only a little  Relationships  . Social connections:    Talks on phone: Patient refused    Gets together: Patient refused    Attends religious service: Patient refused    Active member of club or organization: No    Attends meetings of clubs or organizations: Never    Relationship status: Patient refused  . Intimate partner violence:    Fear of current or ex partner: Patient refused    Emotionally abused: Patient refused    Physically abused: Patient refused    Forced sexual activity: Patient refused  Other Topics Concern  . Not on file  Social History Narrative   Pt recently moved to [place] with daughter from [place]    Past Surgical History:  Procedure Laterality Date  . ABDOMINAL HYSTERECTOMY    . ESOPHAGOGASTRODUODENOSCOPY (EGD) WITH PROPOFOL N/A 02/16/2017   Procedure: ESOPHAGOGASTRODUODENOSCOPY (EGD) WITH PROPOFOL;  Surgeon: Irene Shipper, MD;  Location: [facility] ENDOSCOPY;  Service: Endoscopy;  Laterality: N/A;  . IR FLUORO GUIDE CV MIDLINE PICC RIGHT  07/21/2017  . IR US GUIDE VASC ACCESS RIGHT  07/21/2017  . IRRIGATION AND DEBRIDEMENT ABSCESS N/A  07/11/2017   Procedure: IRRIGATION AND DEBRIDEMENT OF SACRAL ABSCESS;  Surgeon: Ralene Ok, MD;  Location: [facility] ORS;  Service: General;  Laterality: N/A;  SACRUM    Family History  Problem Relation Age of Onset  . Heart disease Father   . Diabetes Mother   . Thyroid disease Neg Hx     Allergies  Allergen Reactions  . Pollen Extract     Sneezing, watery eyes    Current Outpatient Medications on File Prior to Visit  Medication Sig Dispense Refill  . ACCU-CHEK AVIVA PLUS test strip PATIENT TO CHECK SUGARS ONCE DAILY.  3  . acetaminophen (TYLENOL) 500 MG tablet Take 500 mg by mouth every 6 (six) hours as needed for fever.     Marland Kitchen aspirin 325 MG tablet Take 325 mg by mouth daily.    . calcitRIOL (ROCALTROL) 0.5 MCG capsule Take 1 capsule (0.5 mcg total) by  mouth 3 (three) times a week. Monday, Wednesday, and Friday only 36 capsule 3  . carbamide peroxide (DEBROX) 6.5 % OTIC solution Place 5 drops into both ears 2 (two) times daily. (Patient taking differently: Place 5 drops 2 (two) times daily as needed into both ears (irritation). ) 15 mL 1  . cholecalciferol (VITAMIN D) 1000 units tablet Take 1,000 Units by mouth daily.    Marland Kitchen docusate sodium (COLACE) 100 MG capsule Take 100 mg by mouth daily as needed for mild constipation.    Marland Kitchen donepezil (ARICEPT) 10 MG tablet TAKE 1 TABLET ONCE DAILY. 90 tablet 3  . fluconazole (DIFLUCAN) 100 MG tablet Take 1 tablet (100 mg total) by mouth daily. 10 tablet 3  . fluticasone (FLONASE) 50 MCG/ACT nasal spray Place 2 sprays into both nostrils daily.    . furosemide (LASIX) 20 MG tablet Take 1.5 tablets (30 mg total) by mouth daily. Take an extra 0.5 tablet daily as needed. 60 tablet 5  . glimepiride (AMARYL) 2 MG tablet TAKE 1 TABLET ONCE DAILY. (Patient taking differently: TAKE 0.5 TABLET ONCE DAILY) 90 tablet 3  . HYDROcodone-acetaminophen (NORCO) 5-325 MG tablet Take 1 tablet by mouth every 8 (eight) hours as needed for moderate pain. 30 tablet 0  . Inulin (FIBER CHOICE PO) Take 1 capsule by mouth daily.    . Iron-FA-B Cmp-C-Biot-Probiotic (FUSION PLUS) CAPS Take 1 tablet by mouth daily. (Patient taking differently: Take 1 tablet once a week by mouth. ) 90 capsule 3  . loratadine (ALAVERT) 10 MG tablet Take 5 mg daily by mouth.     . memantine (NAMENDA) 5 MG tablet TAKE ONE TABLET AT BEDTIME. 90 tablet 1  . metoprolol tartrate (LOPRESSOR) 25 MG tablet Take 1 tablet (25 mg total) by mouth 2 (two) times daily. 60 tablet 0  . mirabegron ER (MYRBETRIQ) 25 MG TB24 tablet Take 25 mg by mouth daily.    Marland Kitchen omeprazole (PRILOSEC) 40 MG capsule Take 30- 60 min before your first and last meals of the day (Patient taking differently: Take 40 mg daily by mouth. Take 30- 60 min before your first and last meals of the day) 60 capsule  11  . Polyethyl Glycol-Propyl Glycol (SYSTANE) 0.4-0.3 % SOLN Apply 1 drop to eye 2 (two) times daily as needed (dry eyes).    . polyethylene glycol (MIRALAX / GLYCOLAX) packet Take 17 g by mouth daily as needed for mild constipation, moderate constipation or severe constipation.    . potassium chloride SA (K-DUR,KLOR-CON) 20 MEQ tablet TAKE 1 TABLET ONCE DAILY. 90 tablet 0  .  saccharomyces boulardii (FLORASTOR) 250 MG capsule Take 250 mg by mouth 2 (two) times daily.    . vitamin B-12 (CYANOCOBALAMIN) 500 MCG tablet Take 500 mcg by mouth daily.     Current Facility-Administered Medications on File Prior to Visit  Medication Dose Route Frequency Provider Last Rate Last Dose  . lidocaine (PF) (XYLOCAINE) 1 % injection 2 mL  2 mL Other Once Magnus Sinning, MD        There were no vitals taken for this visit.      Objective:   Physical Exam        Assessment & Plan:

## 2018-01-05 ENCOUNTER — Telehealth: Payer: Self-pay | Admitting: Family Medicine

## 2018-01-05 DIAGNOSIS — I509 Heart failure, unspecified: Secondary | ICD-10-CM | POA: Diagnosis not present

## 2018-01-05 DIAGNOSIS — E11621 Type 2 diabetes mellitus with foot ulcer: Secondary | ICD-10-CM | POA: Diagnosis not present

## 2018-01-05 DIAGNOSIS — M4628 Osteomyelitis of vertebra, sacral and sacrococcygeal region: Secondary | ICD-10-CM | POA: Diagnosis not present

## 2018-01-05 DIAGNOSIS — L89891 Pressure ulcer of other site, stage 1: Secondary | ICD-10-CM | POA: Diagnosis not present

## 2018-01-05 DIAGNOSIS — N183 Chronic kidney disease, stage 3 (moderate): Secondary | ICD-10-CM | POA: Diagnosis not present

## 2018-01-05 DIAGNOSIS — L97521 Non-pressure chronic ulcer of other part of left foot limited to breakdown of skin: Secondary | ICD-10-CM | POA: Diagnosis not present

## 2018-01-05 DIAGNOSIS — E1169 Type 2 diabetes mellitus with other specified complication: Secondary | ICD-10-CM | POA: Diagnosis not present

## 2018-01-05 DIAGNOSIS — I130 Hypertensive heart and chronic kidney disease with heart failure and stage 1 through stage 4 chronic kidney disease, or unspecified chronic kidney disease: Secondary | ICD-10-CM | POA: Diagnosis not present

## 2018-01-05 DIAGNOSIS — E1122 Type 2 diabetes mellitus with diabetic chronic kidney disease: Secondary | ICD-10-CM | POA: Diagnosis not present

## 2018-01-05 MED ORDER — HYDROCODONE-ACETAMINOPHEN 5-325 MG PO TABS: 0.5000 | ORAL_TABLET | Freq: Three times a day (TID) | ORAL | 0 refills | Status: DC | PRN

## 2018-01-05 NOTE — Telephone Encounter (Signed)
Copied from [place] [id number]. Topic: General - Other >> Jan 05, 2018  2:53 PM Yvette Rack wrote: Reason for CRM: pt had a drug test done for her medicine and daughter [name] calling about the HYDROcodone-acetaminophen ([place]) 5-325 MG tablet   the pt need it by Tuesday

## 2018-01-06 IMAGING — US US SOFT TISSUE HEAD/NECK
1 series · 13 of 25 positions shown · non-contrast
Comparison: None.

CLINICAL DATA: Incidental on CT.

EXAM:
THYROID ULTRASOUND
TECHNIQUE: Ultrasound examination of the thyroid gland and adjacent soft
tissues was performed.

[Series 1: us soft tissue head/neck · 0.07mm/px · 13 of 51 slices shown]
[im 1/51]
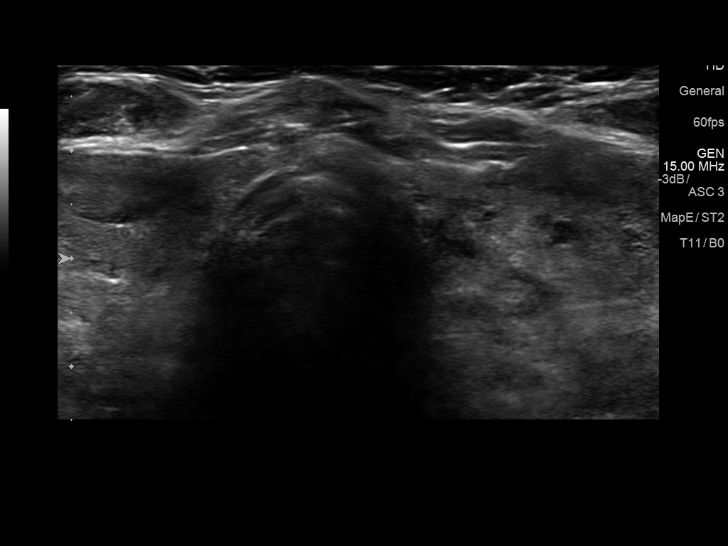
[im 5/51]
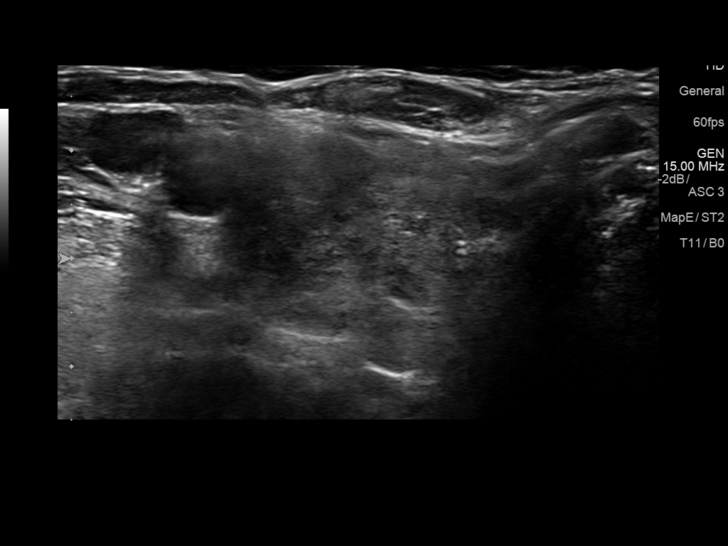
[im 9/51]
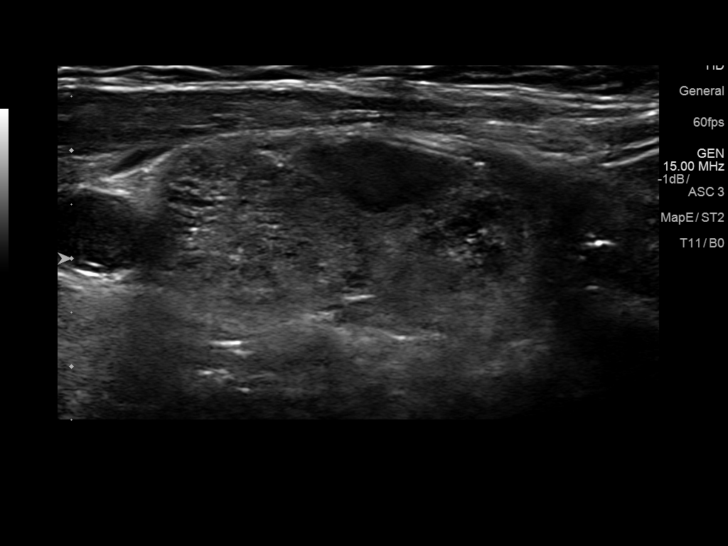
[im 13/51]
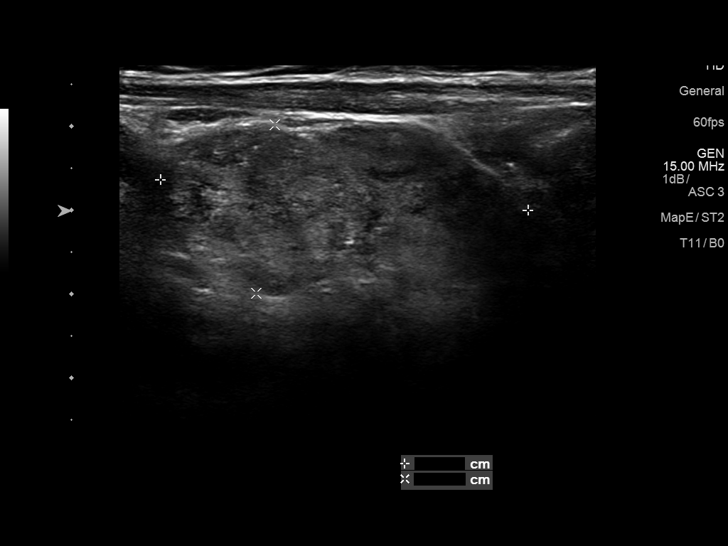
[im 17/51]
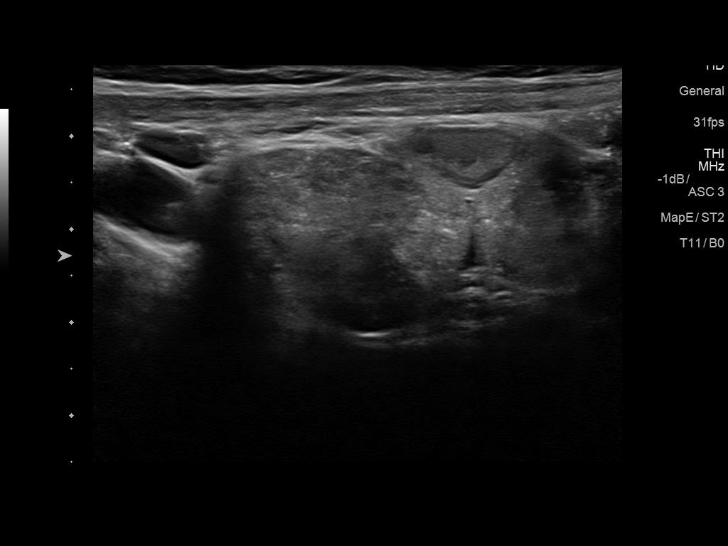
[im 21/51]
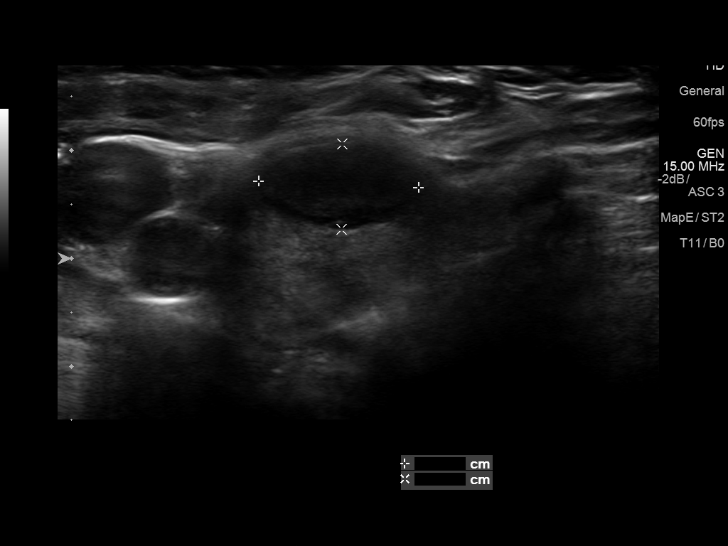
[im 26/51]
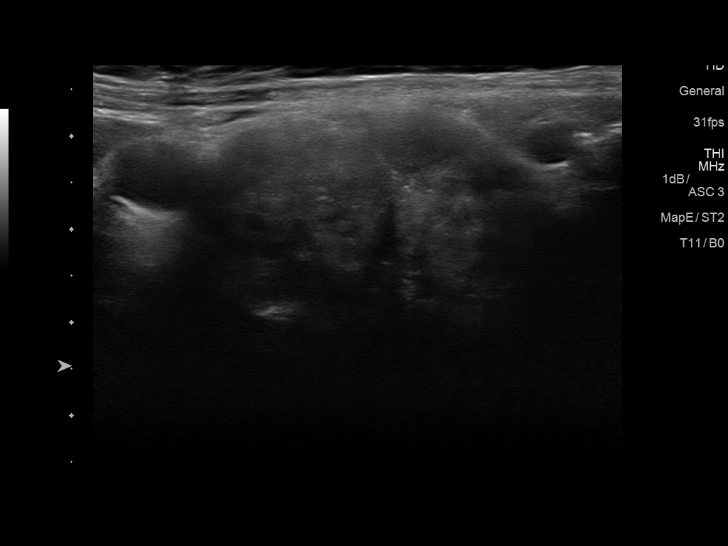
[im 30/51]
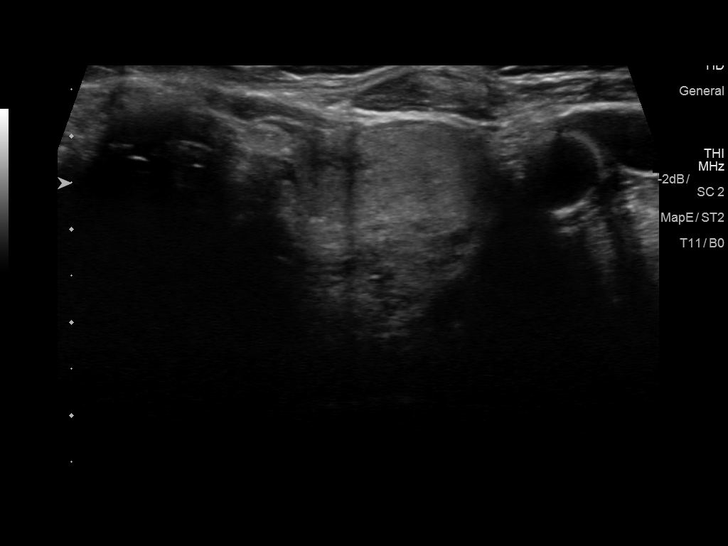
[im 34/51]
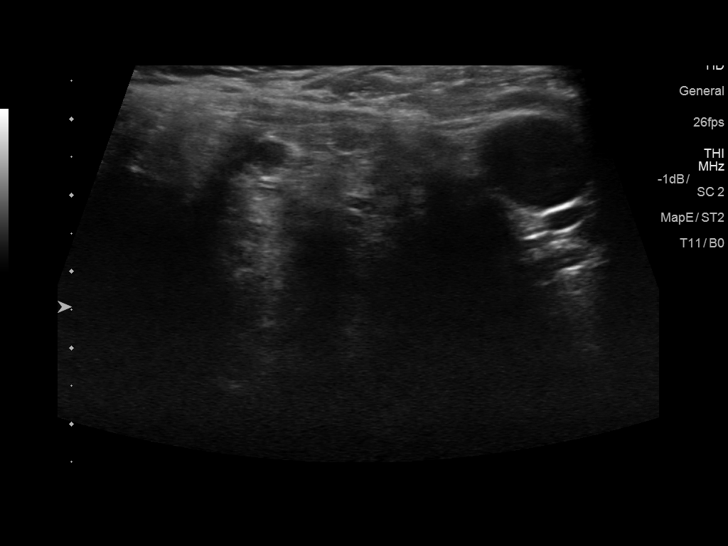
[im 38/51]
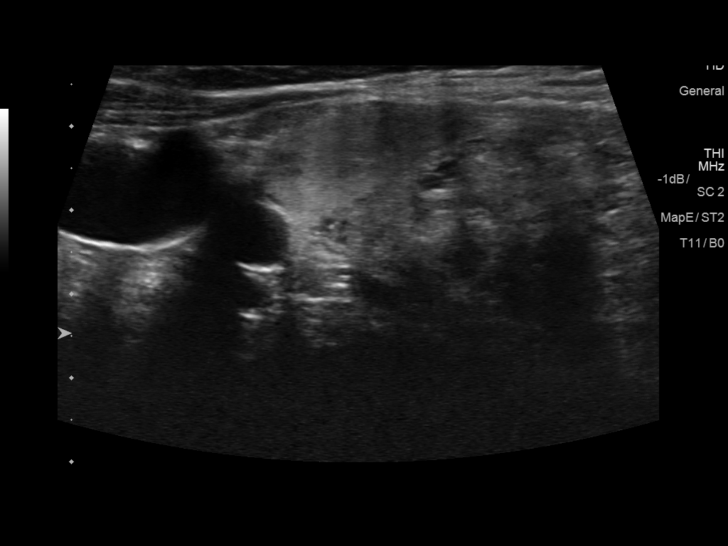
[im 42/51]
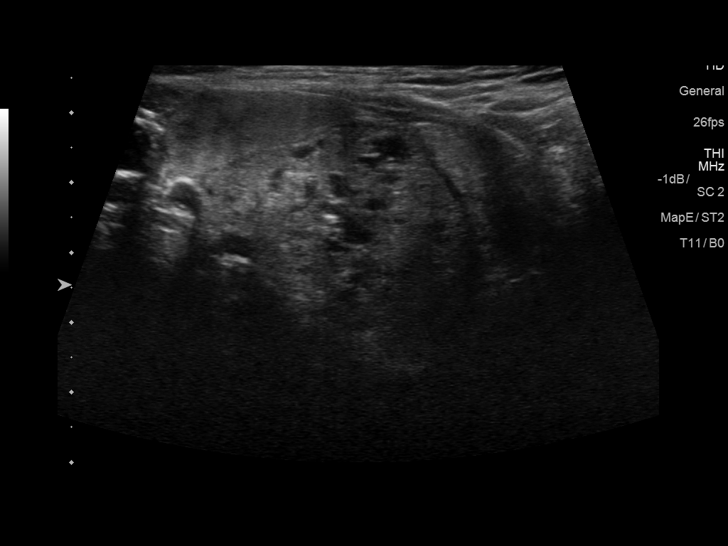
[im 46/51]
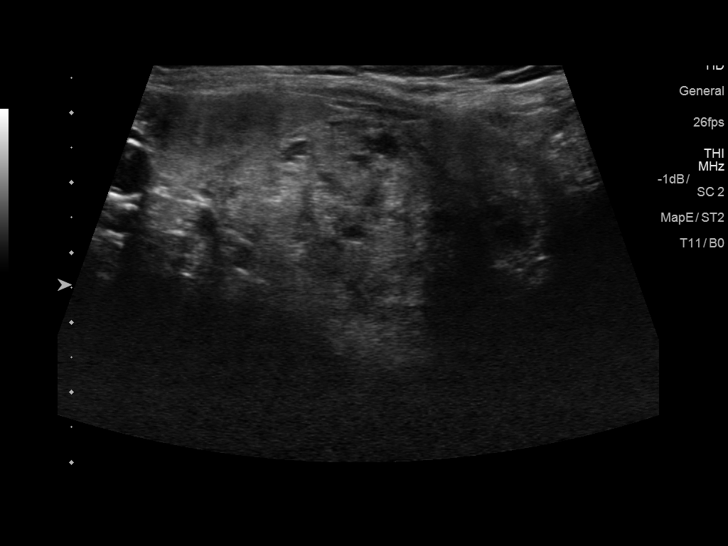
[im 51/51]
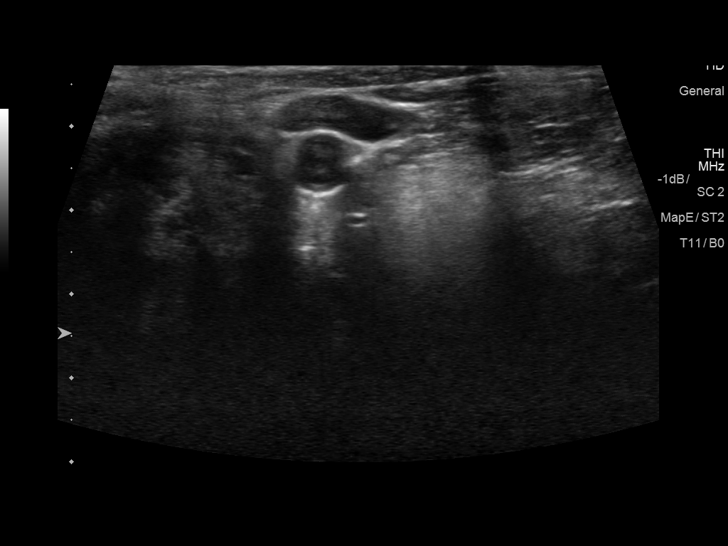

[13 of 25 positions shown; findings below may reference images not displayed]

FINDINGS: Parenchymal Echotexture: Markedly heterogenous

Isthmus: 0.8 cm

Right lobe: 4.5 x 2.1 x 2.7 cm

Left lobe: 5.9 x 3.8 x 2.6 cm

_________________________________________________________

Estimated total number of nodules >/= 1 cm: 3

Number of spongiform nodules >/=  2 cm not described below (TR1): 0

Number of mixed cystic and solid nodules >/= 1.5 cm not described
below (TR2): 0

_________________________________________________________

Nodule # 1:

Location: Right; Superior

Maximum size: 2.3 cm; Other 2 dimensions: 1.8 x 1.8 cm

Composition: solid/almost completely solid (2)

Echogenicity: hypoechoic (2)

Shape: not taller-than-wide (0)

Margins: smooth (0)

Echogenic foci: none (0)

ACR TI-RADS total points: 4.

ACR TI-RADS risk category: TR4 (4-6 points).

ACR TI-RADS recommendations:

**Given size (>/= 1.5 cm) and appearance, fine needle aspiration of
this moderately suspicious nodule should be considered based on
TI-RADS criteria.

_________________________________________________________

Nodule # 2:

Location: Right; Mid

Maximum size: 1.5 cm; Other 2 dimensions: 0.8 x 1.2 cm

Composition: cystic/almost completely cystic (0)

Echogenicity: hypoechoic (2)

Shape: not taller-than-wide (0)

Margins: smooth (0)

Echogenic foci: none (0)

ACR TI-RADS total points: 2.

ACR TI-RADS risk category: TR2 (2 points).

ACR TI-RADS recommendations:

This nodule does NOT meet TI-RADS criteria for biopsy or dedicated
follow-up.

_________________________________________________________

Nodule # 3:

Location: Left; Inferior

Maximum size: 4.6 cm; Other 2 dimensions: 3.3 x 3.0 cm

Composition: solid/almost completely solid (2)

Echogenicity: isoechoic (1)

Shape: taller-than-wide (3)

Margins: ill-defined (0)

Echogenic foci: punctate echogenic foci (3)

ACR TI-RADS total points: 9.

ACR TI-RADS risk category: TR5 (>/= 7 points).

ACR TI-RADS recommendations:

**Given size (>/= 1.0 cm) and appearance, fine needle aspiration of
this highly suspicious nodule should be considered based on TI-RADS
criteria.

_________________________________________________________
IMPRESSION: Bilateral nodules are noted. Nodules 1 and 3 meet criteria for fine
needle aspiration biopsy. Nodule 3 on the left corresponds to the
abnormality noted on the chest CT.

The above is in keeping with the ACR TI-RADS recommendations - [HOSPITAL] 2530;[DATE].

## 2018-01-08 DIAGNOSIS — E1122 Type 2 diabetes mellitus with diabetic chronic kidney disease: Secondary | ICD-10-CM | POA: Diagnosis not present

## 2018-01-08 DIAGNOSIS — L97521 Non-pressure chronic ulcer of other part of left foot limited to breakdown of skin: Secondary | ICD-10-CM | POA: Diagnosis not present

## 2018-01-08 DIAGNOSIS — N183 Chronic kidney disease, stage 3 (moderate): Secondary | ICD-10-CM | POA: Diagnosis not present

## 2018-01-08 DIAGNOSIS — I509 Heart failure, unspecified: Secondary | ICD-10-CM | POA: Diagnosis not present

## 2018-01-08 DIAGNOSIS — L89891 Pressure ulcer of other site, stage 1: Secondary | ICD-10-CM | POA: Diagnosis not present

## 2018-01-08 DIAGNOSIS — I130 Hypertensive heart and chronic kidney disease with heart failure and stage 1 through stage 4 chronic kidney disease, or unspecified chronic kidney disease: Secondary | ICD-10-CM | POA: Diagnosis not present

## 2018-01-08 DIAGNOSIS — M4628 Osteomyelitis of vertebra, sacral and sacrococcygeal region: Secondary | ICD-10-CM | POA: Diagnosis not present

## 2018-01-08 DIAGNOSIS — E11621 Type 2 diabetes mellitus with foot ulcer: Secondary | ICD-10-CM | POA: Diagnosis not present

## 2018-01-08 DIAGNOSIS — E1169 Type 2 diabetes mellitus with other specified complication: Secondary | ICD-10-CM | POA: Diagnosis not present

## 2018-01-09 ENCOUNTER — Ambulatory Visit: Payer: Medicare HMO | Admitting: Adult Health

## 2018-01-09 DIAGNOSIS — E1129 Type 2 diabetes mellitus with other diabetic kidney complication: Secondary | ICD-10-CM | POA: Diagnosis not present

## 2018-01-09 DIAGNOSIS — L0502 Pilonidal sinus with abscess: Secondary | ICD-10-CM | POA: Diagnosis not present

## 2018-01-09 DIAGNOSIS — T8189XA Other complications of procedures, not elsewhere classified, initial encounter: Secondary | ICD-10-CM | POA: Diagnosis not present

## 2018-01-10 DIAGNOSIS — E1169 Type 2 diabetes mellitus with other specified complication: Secondary | ICD-10-CM | POA: Diagnosis not present

## 2018-01-10 DIAGNOSIS — E11621 Type 2 diabetes mellitus with foot ulcer: Secondary | ICD-10-CM | POA: Diagnosis not present

## 2018-01-10 DIAGNOSIS — M4628 Osteomyelitis of vertebra, sacral and sacrococcygeal region: Secondary | ICD-10-CM | POA: Diagnosis not present

## 2018-01-10 DIAGNOSIS — E1122 Type 2 diabetes mellitus with diabetic chronic kidney disease: Secondary | ICD-10-CM | POA: Diagnosis not present

## 2018-01-10 DIAGNOSIS — I130 Hypertensive heart and chronic kidney disease with heart failure and stage 1 through stage 4 chronic kidney disease, or unspecified chronic kidney disease: Secondary | ICD-10-CM | POA: Diagnosis not present

## 2018-01-10 DIAGNOSIS — L97521 Non-pressure chronic ulcer of other part of left foot limited to breakdown of skin: Secondary | ICD-10-CM | POA: Diagnosis not present

## 2018-01-10 DIAGNOSIS — L89891 Pressure ulcer of other site, stage 1: Secondary | ICD-10-CM | POA: Diagnosis not present

## 2018-01-10 DIAGNOSIS — I509 Heart failure, unspecified: Secondary | ICD-10-CM | POA: Diagnosis not present

## 2018-01-10 DIAGNOSIS — N183 Chronic kidney disease, stage 3 (moderate): Secondary | ICD-10-CM | POA: Diagnosis not present

## 2018-01-11 ENCOUNTER — Other Ambulatory Visit: Payer: Self-pay | Admitting: Cardiovascular Disease

## 2018-01-12 DIAGNOSIS — L97521 Non-pressure chronic ulcer of other part of left foot limited to breakdown of skin: Secondary | ICD-10-CM | POA: Diagnosis not present

## 2018-01-12 DIAGNOSIS — N183 Chronic kidney disease, stage 3 (moderate): Secondary | ICD-10-CM | POA: Diagnosis not present

## 2018-01-12 DIAGNOSIS — I509 Heart failure, unspecified: Secondary | ICD-10-CM | POA: Diagnosis not present

## 2018-01-12 DIAGNOSIS — E1169 Type 2 diabetes mellitus with other specified complication: Secondary | ICD-10-CM | POA: Diagnosis not present

## 2018-01-12 DIAGNOSIS — L0502 Pilonidal sinus with abscess: Secondary | ICD-10-CM | POA: Diagnosis not present

## 2018-01-12 DIAGNOSIS — E1122 Type 2 diabetes mellitus with diabetic chronic kidney disease: Secondary | ICD-10-CM | POA: Diagnosis not present

## 2018-01-12 DIAGNOSIS — M4628 Osteomyelitis of vertebra, sacral and sacrococcygeal region: Secondary | ICD-10-CM | POA: Diagnosis not present

## 2018-01-12 DIAGNOSIS — L89891 Pressure ulcer of other site, stage 1: Secondary | ICD-10-CM | POA: Diagnosis not present

## 2018-01-12 DIAGNOSIS — E1129 Type 2 diabetes mellitus with other diabetic kidney complication: Secondary | ICD-10-CM | POA: Diagnosis not present

## 2018-01-12 DIAGNOSIS — T8189XA Other complications of procedures, not elsewhere classified, initial encounter: Secondary | ICD-10-CM | POA: Diagnosis not present

## 2018-01-12 DIAGNOSIS — I130 Hypertensive heart and chronic kidney disease with heart failure and stage 1 through stage 4 chronic kidney disease, or unspecified chronic kidney disease: Secondary | ICD-10-CM | POA: Diagnosis not present

## 2018-01-12 DIAGNOSIS — E11621 Type 2 diabetes mellitus with foot ulcer: Secondary | ICD-10-CM | POA: Diagnosis not present

## 2018-01-14 DIAGNOSIS — R0602 Shortness of breath: Secondary | ICD-10-CM | POA: Diagnosis not present

## 2018-01-14 DIAGNOSIS — I5033 Acute on chronic diastolic (congestive) heart failure: Secondary | ICD-10-CM | POA: Diagnosis not present

## 2018-01-14 DIAGNOSIS — E1129 Type 2 diabetes mellitus with other diabetic kidney complication: Secondary | ICD-10-CM | POA: Diagnosis not present

## 2018-01-14 DIAGNOSIS — G309 Alzheimer's disease, unspecified: Secondary | ICD-10-CM | POA: Diagnosis not present

## 2018-01-15 DIAGNOSIS — I130 Hypertensive heart and chronic kidney disease with heart failure and stage 1 through stage 4 chronic kidney disease, or unspecified chronic kidney disease: Secondary | ICD-10-CM | POA: Diagnosis not present

## 2018-01-15 DIAGNOSIS — E1122 Type 2 diabetes mellitus with diabetic chronic kidney disease: Secondary | ICD-10-CM | POA: Diagnosis not present

## 2018-01-15 DIAGNOSIS — L0501 Pilonidal cyst with abscess: Secondary | ICD-10-CM | POA: Diagnosis not present

## 2018-01-15 DIAGNOSIS — N183 Chronic kidney disease, stage 3 (moderate): Secondary | ICD-10-CM | POA: Diagnosis not present

## 2018-01-15 DIAGNOSIS — E11621 Type 2 diabetes mellitus with foot ulcer: Secondary | ICD-10-CM | POA: Diagnosis not present

## 2018-01-15 DIAGNOSIS — I509 Heart failure, unspecified: Secondary | ICD-10-CM | POA: Diagnosis not present

## 2018-01-15 DIAGNOSIS — G309 Alzheimer's disease, unspecified: Secondary | ICD-10-CM | POA: Diagnosis not present

## 2018-01-15 DIAGNOSIS — L97521 Non-pressure chronic ulcer of other part of left foot limited to breakdown of skin: Secondary | ICD-10-CM | POA: Diagnosis not present

## 2018-01-15 DIAGNOSIS — F0280 Dementia in other diseases classified elsewhere without behavioral disturbance: Secondary | ICD-10-CM | POA: Diagnosis not present

## 2018-01-16 ENCOUNTER — Other Ambulatory Visit: Payer: Self-pay | Admitting: Family Medicine

## 2018-01-16 NOTE — Telephone Encounter (Signed)
Labs checked recently. Ok to refill for one year

## 2018-01-16 NOTE — Telephone Encounter (Signed)
Sent to the pharmacy by e-scribe. 

## 2018-01-17 DIAGNOSIS — L98422 Non-pressure chronic ulcer of back with fat layer exposed: Secondary | ICD-10-CM | POA: Diagnosis not present

## 2018-01-17 DIAGNOSIS — L98492 Non-pressure chronic ulcer of skin of other sites with fat layer exposed: Secondary | ICD-10-CM | POA: Diagnosis not present

## 2018-01-17 DIAGNOSIS — Z7984 Long term (current) use of oral hypoglycemic drugs: Secondary | ICD-10-CM | POA: Diagnosis not present

## 2018-01-17 DIAGNOSIS — F0280 Dementia in other diseases classified elsewhere without behavioral disturbance: Secondary | ICD-10-CM | POA: Diagnosis not present

## 2018-01-17 DIAGNOSIS — I509 Heart failure, unspecified: Secondary | ICD-10-CM | POA: Diagnosis not present

## 2018-01-17 DIAGNOSIS — E1122 Type 2 diabetes mellitus with diabetic chronic kidney disease: Secondary | ICD-10-CM | POA: Diagnosis not present

## 2018-01-17 DIAGNOSIS — I130 Hypertensive heart and chronic kidney disease with heart failure and stage 1 through stage 4 chronic kidney disease, or unspecified chronic kidney disease: Secondary | ICD-10-CM | POA: Diagnosis not present

## 2018-01-17 DIAGNOSIS — E11622 Type 2 diabetes mellitus with other skin ulcer: Secondary | ICD-10-CM | POA: Diagnosis not present

## 2018-01-17 DIAGNOSIS — G309 Alzheimer's disease, unspecified: Secondary | ICD-10-CM | POA: Diagnosis not present

## 2018-01-17 DIAGNOSIS — N183 Chronic kidney disease, stage 3 (moderate): Secondary | ICD-10-CM | POA: Diagnosis not present

## 2018-01-17 LAB — OPIATES,MS,WB/SP RFX
6-ACETYLMORPHINE: NEGATIVE
CODEINE: NEGATIVE ng/mL
Dihydrocodeine: NEGATIVE ng/mL
HYDROCODONE: 1.7 ng/mL
HYDROMORPHONE: NEGATIVE ng/mL
MORPHINE: NEGATIVE ng/mL
Opiate Confirmation: POSITIVE

## 2018-01-17 LAB — DRUG SCREEN 11 W/CONF, SE
AMPHETAMINES, IA: NEGATIVE ng/mL
BARBITURATES, IA: NEGATIVE ug/mL
BENZODIAZEPINES, IA: NEGATIVE ng/mL
Cocaine & Metabolite, IA: NEGATIVE ng/mL
Ethyl Alcohol, Enz: NEGATIVE gm/dL
METHADONE, IA: NEGATIVE ng/mL
Opiates, IA: POSITIVE ng/mL — AB
Oxycodones, IA: NEGATIVE ng/mL
Phencyclidine, IA: NEGATIVE ng/mL
Propoxyphene, IA: NEGATIVE ng/mL
THC(Marijuana) Metabolite, IA: NEGATIVE ng/mL

## 2018-01-17 LAB — OXYCODONES,MS,WB/SP RFX
Oxycocone: NEGATIVE ng/mL
Oxycodones Confirmation: NEGATIVE
Oxymorphone: NEGATIVE ng/mL

## 2018-01-18 ENCOUNTER — Other Ambulatory Visit: Payer: Self-pay | Admitting: Adult Health

## 2018-01-19 DIAGNOSIS — N183 Chronic kidney disease, stage 3 (moderate): Secondary | ICD-10-CM | POA: Diagnosis not present

## 2018-01-19 DIAGNOSIS — G309 Alzheimer's disease, unspecified: Secondary | ICD-10-CM | POA: Diagnosis not present

## 2018-01-19 DIAGNOSIS — I130 Hypertensive heart and chronic kidney disease with heart failure and stage 1 through stage 4 chronic kidney disease, or unspecified chronic kidney disease: Secondary | ICD-10-CM | POA: Diagnosis not present

## 2018-01-19 DIAGNOSIS — E1122 Type 2 diabetes mellitus with diabetic chronic kidney disease: Secondary | ICD-10-CM | POA: Diagnosis not present

## 2018-01-19 DIAGNOSIS — I509 Heart failure, unspecified: Secondary | ICD-10-CM | POA: Diagnosis not present

## 2018-01-19 DIAGNOSIS — L97521 Non-pressure chronic ulcer of other part of left foot limited to breakdown of skin: Secondary | ICD-10-CM | POA: Diagnosis not present

## 2018-01-19 DIAGNOSIS — E11621 Type 2 diabetes mellitus with foot ulcer: Secondary | ICD-10-CM | POA: Diagnosis not present

## 2018-01-19 DIAGNOSIS — L0501 Pilonidal cyst with abscess: Secondary | ICD-10-CM | POA: Diagnosis not present

## 2018-01-19 DIAGNOSIS — F0280 Dementia in other diseases classified elsewhere without behavioral disturbance: Secondary | ICD-10-CM | POA: Diagnosis not present

## 2018-01-22 DIAGNOSIS — N183 Chronic kidney disease, stage 3 (moderate): Secondary | ICD-10-CM | POA: Diagnosis not present

## 2018-01-22 DIAGNOSIS — L97521 Non-pressure chronic ulcer of other part of left foot limited to breakdown of skin: Secondary | ICD-10-CM | POA: Diagnosis not present

## 2018-01-22 DIAGNOSIS — I509 Heart failure, unspecified: Secondary | ICD-10-CM | POA: Diagnosis not present

## 2018-01-22 DIAGNOSIS — E1122 Type 2 diabetes mellitus with diabetic chronic kidney disease: Secondary | ICD-10-CM | POA: Diagnosis not present

## 2018-01-22 DIAGNOSIS — E11621 Type 2 diabetes mellitus with foot ulcer: Secondary | ICD-10-CM | POA: Diagnosis not present

## 2018-01-22 DIAGNOSIS — L0501 Pilonidal cyst with abscess: Secondary | ICD-10-CM | POA: Diagnosis not present

## 2018-01-22 DIAGNOSIS — G309 Alzheimer's disease, unspecified: Secondary | ICD-10-CM | POA: Diagnosis not present

## 2018-01-22 DIAGNOSIS — F0280 Dementia in other diseases classified elsewhere without behavioral disturbance: Secondary | ICD-10-CM | POA: Diagnosis not present

## 2018-01-22 DIAGNOSIS — I130 Hypertensive heart and chronic kidney disease with heart failure and stage 1 through stage 4 chronic kidney disease, or unspecified chronic kidney disease: Secondary | ICD-10-CM | POA: Diagnosis not present

## 2018-01-23 ENCOUNTER — Telehealth: Payer: Self-pay | Admitting: Cardiovascular Disease

## 2018-01-23 NOTE — Telephone Encounter (Signed)
New Message    Pt c/o medication issue:  1. Name of Medication: metoprolol tartrate (LOPRESSOR) 25 MG tablet  2. How are you currently taking this medication (dosage and times per day)? Take 1 tablet (25 mg total) by mouth 2 (two) times daily.  3. Are you having a reaction (difficulty breathing--STAT)?   4. What is your medication issue? Patients daughter is calling to see if they can cut back the prescription to 1/2 in the morning and 1/2 at night.

## 2018-01-23 NOTE — Telephone Encounter (Signed)
OK thank you 

## 2018-01-23 NOTE — Telephone Encounter (Signed)
Spoke with daughter and she is concerned with mother taking the Metoprolol 25 mg twice a day. She would like to decrease to 25 mg 1/2 tablet twice secondary to HR dropping. Patients HR 40's-50's when she is sitting up with feet down. During these times daughter states she looks pale and tired.  Only drops this low in a sitting position. During activity when daughter trying to get her to exercise HR 90-100, does come back down during rest. When she is in a reclining position HR 70's-80's. Per daughter medication was changed when she hospitalized with infection in November and is doing much better now. She will decrease Metoprolol to 25 mg 1/2 tablet twice a day and continue to monitor.  She will call back if any further issues.

## 2018-01-24 DIAGNOSIS — G309 Alzheimer's disease, unspecified: Secondary | ICD-10-CM | POA: Diagnosis not present

## 2018-01-24 DIAGNOSIS — L0501 Pilonidal cyst with abscess: Secondary | ICD-10-CM | POA: Diagnosis not present

## 2018-01-24 DIAGNOSIS — N183 Chronic kidney disease, stage 3 (moderate): Secondary | ICD-10-CM | POA: Diagnosis not present

## 2018-01-24 DIAGNOSIS — E1122 Type 2 diabetes mellitus with diabetic chronic kidney disease: Secondary | ICD-10-CM | POA: Diagnosis not present

## 2018-01-24 DIAGNOSIS — E11621 Type 2 diabetes mellitus with foot ulcer: Secondary | ICD-10-CM | POA: Diagnosis not present

## 2018-01-24 DIAGNOSIS — I130 Hypertensive heart and chronic kidney disease with heart failure and stage 1 through stage 4 chronic kidney disease, or unspecified chronic kidney disease: Secondary | ICD-10-CM | POA: Diagnosis not present

## 2018-01-24 DIAGNOSIS — I509 Heart failure, unspecified: Secondary | ICD-10-CM | POA: Diagnosis not present

## 2018-01-24 DIAGNOSIS — F0280 Dementia in other diseases classified elsewhere without behavioral disturbance: Secondary | ICD-10-CM | POA: Diagnosis not present

## 2018-01-24 DIAGNOSIS — L97521 Non-pressure chronic ulcer of other part of left foot limited to breakdown of skin: Secondary | ICD-10-CM | POA: Diagnosis not present

## 2018-01-26 DIAGNOSIS — I509 Heart failure, unspecified: Secondary | ICD-10-CM | POA: Diagnosis not present

## 2018-01-26 DIAGNOSIS — F0280 Dementia in other diseases classified elsewhere without behavioral disturbance: Secondary | ICD-10-CM | POA: Diagnosis not present

## 2018-01-26 DIAGNOSIS — L97521 Non-pressure chronic ulcer of other part of left foot limited to breakdown of skin: Secondary | ICD-10-CM | POA: Diagnosis not present

## 2018-01-26 DIAGNOSIS — N183 Chronic kidney disease, stage 3 (moderate): Secondary | ICD-10-CM | POA: Diagnosis not present

## 2018-01-26 DIAGNOSIS — E1122 Type 2 diabetes mellitus with diabetic chronic kidney disease: Secondary | ICD-10-CM | POA: Diagnosis not present

## 2018-01-26 DIAGNOSIS — I130 Hypertensive heart and chronic kidney disease with heart failure and stage 1 through stage 4 chronic kidney disease, or unspecified chronic kidney disease: Secondary | ICD-10-CM | POA: Diagnosis not present

## 2018-01-26 DIAGNOSIS — G309 Alzheimer's disease, unspecified: Secondary | ICD-10-CM | POA: Diagnosis not present

## 2018-01-26 DIAGNOSIS — L0501 Pilonidal cyst with abscess: Secondary | ICD-10-CM | POA: Diagnosis not present

## 2018-01-26 DIAGNOSIS — E11621 Type 2 diabetes mellitus with foot ulcer: Secondary | ICD-10-CM | POA: Diagnosis not present

## 2018-01-29 DIAGNOSIS — I509 Heart failure, unspecified: Secondary | ICD-10-CM | POA: Diagnosis not present

## 2018-01-29 DIAGNOSIS — F0280 Dementia in other diseases classified elsewhere without behavioral disturbance: Secondary | ICD-10-CM | POA: Diagnosis not present

## 2018-01-29 DIAGNOSIS — G309 Alzheimer's disease, unspecified: Secondary | ICD-10-CM | POA: Diagnosis not present

## 2018-01-29 DIAGNOSIS — I130 Hypertensive heart and chronic kidney disease with heart failure and stage 1 through stage 4 chronic kidney disease, or unspecified chronic kidney disease: Secondary | ICD-10-CM | POA: Diagnosis not present

## 2018-01-29 DIAGNOSIS — L97521 Non-pressure chronic ulcer of other part of left foot limited to breakdown of skin: Secondary | ICD-10-CM | POA: Diagnosis not present

## 2018-01-29 DIAGNOSIS — E1122 Type 2 diabetes mellitus with diabetic chronic kidney disease: Secondary | ICD-10-CM | POA: Diagnosis not present

## 2018-01-29 DIAGNOSIS — E11621 Type 2 diabetes mellitus with foot ulcer: Secondary | ICD-10-CM | POA: Diagnosis not present

## 2018-01-29 DIAGNOSIS — L0501 Pilonidal cyst with abscess: Secondary | ICD-10-CM | POA: Diagnosis not present

## 2018-01-29 DIAGNOSIS — N183 Chronic kidney disease, stage 3 (moderate): Secondary | ICD-10-CM | POA: Diagnosis not present

## 2018-01-30 ENCOUNTER — Emergency Department (HOSPITAL_COMMUNITY): Payer: Medicare HMO

## 2018-01-30 ENCOUNTER — Other Ambulatory Visit: Payer: Self-pay | Admitting: Adult Health

## 2018-01-30 ENCOUNTER — Encounter (HOSPITAL_COMMUNITY): Payer: Self-pay

## 2018-01-30 ENCOUNTER — Inpatient Hospital Stay (HOSPITAL_COMMUNITY)
Admission: EM | Admit: 2018-01-30 | Discharge: 2018-02-07 | DRG: 871 | Disposition: A | Discharge status: Hospice | Payer: Medicare HMO | Attending: Internal Medicine | Admitting: Internal Medicine

## 2018-01-30 ENCOUNTER — Telehealth: Payer: Self-pay | Admitting: Adult Health

## 2018-01-30 DIAGNOSIS — E870 Hyperosmolality and hypernatremia: Secondary | ICD-10-CM | POA: Diagnosis not present

## 2018-01-30 DIAGNOSIS — E1121 Type 2 diabetes mellitus with diabetic nephropathy: Secondary | ICD-10-CM | POA: Diagnosis not present

## 2018-01-30 DIAGNOSIS — K219 Gastro-esophageal reflux disease without esophagitis: Secondary | ICD-10-CM | POA: Diagnosis present

## 2018-01-30 DIAGNOSIS — G309 Alzheimer's disease, unspecified: Secondary | ICD-10-CM | POA: Diagnosis present

## 2018-01-30 DIAGNOSIS — Z66 Do not resuscitate: Secondary | ICD-10-CM | POA: Diagnosis present

## 2018-01-30 DIAGNOSIS — F0281 Dementia in other diseases classified elsewhere with behavioral disturbance: Secondary | ICD-10-CM | POA: Diagnosis present

## 2018-01-30 DIAGNOSIS — N3000 Acute cystitis without hematuria: Secondary | ICD-10-CM | POA: Diagnosis not present

## 2018-01-30 DIAGNOSIS — R0902 Hypoxemia: Secondary | ICD-10-CM | POA: Diagnosis not present

## 2018-01-30 DIAGNOSIS — F02818 Dementia in other diseases classified elsewhere, unspecified severity, with other behavioral disturbance: Secondary | ICD-10-CM | POA: Diagnosis present

## 2018-01-30 DIAGNOSIS — R001 Bradycardia, unspecified: Secondary | ICD-10-CM | POA: Diagnosis not present

## 2018-01-30 DIAGNOSIS — Z79899 Other long term (current) drug therapy: Secondary | ICD-10-CM

## 2018-01-30 DIAGNOSIS — E872 Acidosis: Secondary | ICD-10-CM | POA: Diagnosis present

## 2018-01-30 DIAGNOSIS — G301 Alzheimer's disease with late onset: Secondary | ICD-10-CM | POA: Diagnosis not present

## 2018-01-30 DIAGNOSIS — J9811 Atelectasis: Secondary | ICD-10-CM | POA: Diagnosis present

## 2018-01-30 DIAGNOSIS — Z7189 Other specified counseling: Secondary | ICD-10-CM | POA: Diagnosis not present

## 2018-01-30 DIAGNOSIS — G9341 Metabolic encephalopathy: Secondary | ICD-10-CM | POA: Diagnosis present

## 2018-01-30 DIAGNOSIS — I10 Essential (primary) hypertension: Secondary | ICD-10-CM | POA: Diagnosis present

## 2018-01-30 DIAGNOSIS — L8990 Pressure ulcer of unspecified site, unspecified stage: Secondary | ICD-10-CM

## 2018-01-30 DIAGNOSIS — E0590 Thyrotoxicosis, unspecified without thyrotoxic crisis or storm: Secondary | ICD-10-CM | POA: Diagnosis present

## 2018-01-30 DIAGNOSIS — E860 Dehydration: Secondary | ICD-10-CM | POA: Diagnosis present

## 2018-01-30 DIAGNOSIS — I130 Hypertensive heart and chronic kidney disease with heart failure and stage 1 through stage 4 chronic kidney disease, or unspecified chronic kidney disease: Secondary | ICD-10-CM | POA: Diagnosis present

## 2018-01-30 DIAGNOSIS — R4182 Altered mental status, unspecified: Secondary | ICD-10-CM | POA: Diagnosis not present

## 2018-01-30 DIAGNOSIS — R918 Other nonspecific abnormal finding of lung field: Secondary | ICD-10-CM | POA: Diagnosis not present

## 2018-01-30 DIAGNOSIS — Z7982 Long term (current) use of aspirin: Secondary | ICD-10-CM

## 2018-01-30 DIAGNOSIS — J189 Pneumonia, unspecified organism: Secondary | ICD-10-CM | POA: Diagnosis present

## 2018-01-30 DIAGNOSIS — I4891 Unspecified atrial fibrillation: Secondary | ICD-10-CM | POA: Diagnosis present

## 2018-01-30 DIAGNOSIS — I482 Chronic atrial fibrillation: Secondary | ICD-10-CM | POA: Diagnosis present

## 2018-01-30 DIAGNOSIS — I2699 Other pulmonary embolism without acute cor pulmonale: Secondary | ICD-10-CM | POA: Diagnosis not present

## 2018-01-30 DIAGNOSIS — E785 Hyperlipidemia, unspecified: Secondary | ICD-10-CM | POA: Diagnosis present

## 2018-01-30 DIAGNOSIS — I499 Cardiac arrhythmia, unspecified: Secondary | ICD-10-CM | POA: Diagnosis not present

## 2018-01-30 DIAGNOSIS — E1165 Type 2 diabetes mellitus with hyperglycemia: Secondary | ICD-10-CM

## 2018-01-30 DIAGNOSIS — I5033 Acute on chronic diastolic (congestive) heart failure: Secondary | ICD-10-CM | POA: Diagnosis present

## 2018-01-30 DIAGNOSIS — K55069 Acute infarction of intestine, part and extent unspecified: Secondary | ICD-10-CM | POA: Diagnosis not present

## 2018-01-30 DIAGNOSIS — N390 Urinary tract infection, site not specified: Secondary | ICD-10-CM | POA: Diagnosis not present

## 2018-01-30 DIAGNOSIS — I2782 Chronic pulmonary embolism: Secondary | ICD-10-CM | POA: Diagnosis present

## 2018-01-30 DIAGNOSIS — N179 Acute kidney failure, unspecified: Secondary | ICD-10-CM | POA: Diagnosis not present

## 2018-01-30 DIAGNOSIS — I447 Left bundle-branch block, unspecified: Secondary | ICD-10-CM | POA: Diagnosis present

## 2018-01-30 DIAGNOSIS — R231 Pallor: Secondary | ICD-10-CM | POA: Diagnosis not present

## 2018-01-30 DIAGNOSIS — A419 Sepsis, unspecified organism: Secondary | ICD-10-CM | POA: Diagnosis not present

## 2018-01-30 DIAGNOSIS — I5022 Chronic systolic (congestive) heart failure: Secondary | ICD-10-CM | POA: Diagnosis not present

## 2018-01-30 DIAGNOSIS — I5043 Acute on chronic combined systolic (congestive) and diastolic (congestive) heart failure: Secondary | ICD-10-CM | POA: Diagnosis not present

## 2018-01-30 DIAGNOSIS — E1122 Type 2 diabetes mellitus with diabetic chronic kidney disease: Secondary | ICD-10-CM | POA: Diagnosis present

## 2018-01-30 DIAGNOSIS — I361 Nonrheumatic tricuspid (valve) insufficiency: Secondary | ICD-10-CM | POA: Diagnosis not present

## 2018-01-30 DIAGNOSIS — E1129 Type 2 diabetes mellitus with other diabetic kidney complication: Secondary | ICD-10-CM | POA: Diagnosis present

## 2018-01-30 DIAGNOSIS — N183 Chronic kidney disease, stage 3 (moderate): Secondary | ICD-10-CM | POA: Diagnosis present

## 2018-01-30 DIAGNOSIS — Z515 Encounter for palliative care: Secondary | ICD-10-CM | POA: Diagnosis not present

## 2018-01-30 DIAGNOSIS — J690 Pneumonitis due to inhalation of food and vomit: Secondary | ICD-10-CM | POA: Diagnosis present

## 2018-01-30 DIAGNOSIS — Z7984 Long term (current) use of oral hypoglycemic drugs: Secondary | ICD-10-CM

## 2018-01-30 DIAGNOSIS — R4189 Other symptoms and signs involving cognitive functions and awareness: Secondary | ICD-10-CM | POA: Diagnosis not present

## 2018-01-30 DIAGNOSIS — R531 Weakness: Secondary | ICD-10-CM | POA: Diagnosis not present

## 2018-01-30 DIAGNOSIS — R6520 Severe sepsis without septic shock: Secondary | ICD-10-CM | POA: Diagnosis not present

## 2018-01-30 DIAGNOSIS — R109 Unspecified abdominal pain: Secondary | ICD-10-CM | POA: Diagnosis not present

## 2018-01-30 DIAGNOSIS — L89159 Pressure ulcer of sacral region, unspecified stage: Secondary | ICD-10-CM | POA: Diagnosis present

## 2018-01-30 DIAGNOSIS — R0689 Other abnormalities of breathing: Secondary | ICD-10-CM | POA: Diagnosis not present

## 2018-01-30 LAB — HEPATIC FUNCTION PANEL
ALT: 13 U/L — AB (ref 14–54)
AST: 25 U/L (ref 15–41)
Albumin: 4 g/dL (ref 3.5–5.0)
Alkaline Phosphatase: 82 U/L (ref 38–126)
Bilirubin, Direct: 0.1 mg/dL (ref 0.1–0.5)
Indirect Bilirubin: 0.7 mg/dL (ref 0.3–0.9)
Total Bilirubin: 0.8 mg/dL (ref 0.3–1.2)
Total Protein: 7.3 g/dL (ref 6.5–8.1)

## 2018-01-30 LAB — URINALYSIS, ROUTINE W REFLEX MICROSCOPIC
Bilirubin Urine: NEGATIVE
Glucose, UA: NEGATIVE mg/dL
Hgb urine dipstick: NEGATIVE
Ketones, ur: 5 mg/dL — AB
Nitrite: NEGATIVE
PROTEIN: NEGATIVE mg/dL
Specific Gravity, Urine: 1.016 (ref 1.005–1.030)
pH: 5 (ref 5.0–8.0)

## 2018-01-30 LAB — BASIC METABOLIC PANEL
Anion gap: 12 (ref 5–15)
BUN: 34 mg/dL — AB (ref 6–20)
CALCIUM: 10.3 mg/dL (ref 8.9–10.3)
CO2: 26 mmol/L (ref 22–32)
CREATININE: 1.18 mg/dL — AB (ref 0.44–1.00)
Chloride: 107 mmol/L (ref 101–111)
GFR calc Af Amer: 47 mL/min — ABNORMAL LOW (ref >60)
GFR, EST NON AFRICAN AMERICAN: 40 mL/min — AB (ref >60)
GLUCOSE: 225 mg/dL — AB (ref 65–99)
POTASSIUM: 3.6 mmol/L (ref 3.5–5.1)
Sodium: 145 mmol/L (ref 135–145)

## 2018-01-30 LAB — CBC
HCT: 48.8 % — ABNORMAL HIGH (ref 36.0–46.0)
Hemoglobin: 15.9 g/dL — ABNORMAL HIGH (ref 12.0–15.0)
MCH: 30.9 pg (ref 26.0–34.0)
MCHC: 32.6 g/dL (ref 30.0–36.0)
MCV: 94.8 fL (ref 78.0–100.0)
Platelets: 267 10*3/uL (ref 150–400)
RBC: 5.15 MIL/uL — AB (ref 3.87–5.11)
RDW: 14.8 % (ref 11.5–15.5)
WBC: 9.8 10*3/uL (ref 4.0–10.5)

## 2018-01-30 LAB — I-STAT CG4 LACTIC ACID, ED: Lactic Acid, Venous: 4.75 mmol/L (ref 0.5–1.9)

## 2018-01-30 LAB — I-STAT TROPONIN, ED: Troponin i, poc: 0.02 ng/mL (ref 0.00–0.08)

## 2018-01-30 LAB — CBG MONITORING, ED: Glucose-Capillary: 171 mg/dL — ABNORMAL HIGH (ref 65–99)

## 2018-01-30 MED ORDER — SODIUM CHLORIDE 0.9 % IV BOLUS: 1000.0000 mL | Freq: Once | INTRAVENOUS | Status: DC

## 2018-01-30 MED ORDER — METOPROLOL TARTRATE 5 MG/5ML IV SOLN
5.0000 mg | Freq: Four times a day (QID) | INTRAVENOUS | Status: DC
  Administered 2018-01-31 (×3): 5 mg via INTRAVENOUS
  Filled 2018-01-30 (×3): qty 5

## 2018-01-30 MED ORDER — HYDROCODONE-ACETAMINOPHEN 5-325 MG PO TABS: 0.5000 | ORAL_TABLET | Freq: Three times a day (TID) | ORAL | 0 refills | Status: AC | PRN

## 2018-01-30 MED ORDER — SODIUM CHLORIDE 0.9 % IV SOLN
1.0000 g | Freq: Once | INTRAVENOUS | Status: AC
  Administered 2018-01-30: 1 g via INTRAVENOUS
  Filled 2018-01-30: qty 10

## 2018-01-30 MED ORDER — SODIUM CHLORIDE 0.9 % IV BOLUS
500.0000 mL | Freq: Once | INTRAVENOUS | Status: AC
  Administered 2018-01-30: 500 mL via INTRAVENOUS

## 2018-01-30 MED ORDER — NALOXONE HCL 2 MG/2ML IJ SOSY: 1.0000 mg | PREFILLED_SYRINGE | Freq: Once | INTRAMUSCULAR | Status: DC

## 2018-01-30 MED ORDER — SODIUM CHLORIDE 0.9 % IV SOLN
500.0000 mg | Freq: Once | INTRAVENOUS | Status: AC
  Administered 2018-01-30: 500 mg via INTRAVENOUS
  Filled 2018-01-30: qty 500

## 2018-01-30 MED ORDER — METOPROLOL TARTRATE 25 MG PO TABS: 25.0000 mg | ORAL_TABLET | Freq: Two times a day (BID) | ORAL | Status: DC

## 2018-01-30 NOTE — ED Notes (Signed)
Family member was very rude towards tech, family member asked sitter to notify [employer] that they needed help cleaning patient up. I informed family member that [employer] is not the tech due to staffing issues and that I would be in too assist after I finished what I was doing. 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 I did not refuse to assist and was present in the room to help her. Family member was changing dressing on patients bottom but having her daughter go through the cabinets for dressing change stuff.

## 2018-01-30 NOTE — H&P (Signed)
History and Physical    [name]:[id number] DOB: 1929/04/11 DOA: 01/30/2018  PCP: Dorothyann Peng, NP   Patient coming from: Home   Chief Complaint: Lethargy  HPI: Anna [name] is a 84 y.o. female with medical history significant for Alzhemiers dementia, systolic and diastolic CHF, DM2, atrial fibrillation, was brought to the ED via EMS with reports of diaphoresis with clamminess and lethargy today.  Patient is lethargic and has significant dementia and not able to give me history.  History is obtained from patient's daughter Anna [name] who is [place] patient's daughter, and who patient resides with.  Patient's daughter reports over the past week she has reduced patient's fluid intake due to CHF history, hearing crackles in patient's lungs, also home Lasix continued. She also reports choking and coughing for the past few weeks most times when patient tries to drink water, reports no choking episodes with solid food.  No documented fevers.  Patient at baseline stands with assistance, not able to ambulates without assistance.  At baseline recognizes her daughter but cannot hold a conversation, answers yes no to simple questions.  Patient had no reported pain or discomfort-reported chest pain dysuria.   She had coccygeal abscess, which grew E faecalis, and strep on bone 06/2017, patient followed up with ID and has subsequently followed up with an surgery.  Reports no drainage from chronic wound, and has been doing well from surgery standpoint.   ED Course: Heart rate initially 90s increased to 120s in the ED, blood pressure elevated systolic 585I to 778E, tachypnea to 39, O2 sats greater than 91% -room air.  WBC- 9.8, hemoglobin 15.9, creatinine 1.18, k- 3.6, UA-large leukocytes many bacteria, i-STAT troponin 0.02.  Lactic acid 4.75.  Portable chest x-ray shows mild patchy left lower lobe opacity-mild atelectasis or infiltrate.   Review of Systems: Able to obtain due to patient's lethargy and  significant dementia at baseline.  Past Medical History:  Diagnosis Date  . Alzheimer's disease   . Atrial fibrillation ([place])   . Bronchitis   . CHF (congestive heart failure) ([place])   . Chronic renal insufficiency, stage III (moderate) (HCC)   . Diabetes mellitus without complication ([place])   . Dyspnea    with exertion  . GERD (gastroesophageal reflux disease)   . GI bleeding 11/2013   due to supratherapeutic INR  . Hyperlipidemia   . Hypertension   . Hyperthyroidism    on amiodarone  . LBBB (left bundle branch block)   . Overactive bladder   . Overweight   . Persistent atrial fibrillation ([place])   . Pneumonia   . Thyroid mass    LLL  . UTI (urinary tract infection)   . Vitamin B12 deficiency   . Vitamin D deficiency     Past Surgical History:  Procedure Laterality Date  . ABDOMINAL HYSTERECTOMY    . ESOPHAGOGASTRODUODENOSCOPY (EGD) WITH PROPOFOL N/A 02/16/2017   Procedure: ESOPHAGOGASTRODUODENOSCOPY (EGD) WITH PROPOFOL;  Surgeon: Irene Shipper, MD;  Location: [facility] ENDOSCOPY;  Service: Endoscopy;  Laterality: N/A;  . IR FLUORO GUIDE CV MIDLINE PICC RIGHT  07/21/2017  . IR US GUIDE VASC ACCESS RIGHT  07/21/2017  . IRRIGATION AND DEBRIDEMENT ABSCESS N/A 07/11/2017   Procedure: IRRIGATION AND DEBRIDEMENT OF SACRAL ABSCESS;  Surgeon: Ralene Ok, MD;  Location: [facility] ORS;  Service: General;  Laterality: N/A;  SACRUM     reports that she has never smoked. She has never used smokeless tobacco. She reports that she does not drink alcohol or  use drugs.  Allergies  Allergen Reactions  . Pollen Extract     Sneezing, watery eyes    Family History  Problem Relation Age of Onset  . Heart disease Father   . Diabetes Mother   . Thyroid disease Neg Hx     Prior to Admission medications   Medication Sig Start Date End Date Taking? Authorizing Provider  aspirin 325 MG tablet Take 325 mg by mouth daily.   Yes [provider]  calcitRIOL (ROCALTROL) 0.5 MCG capsule TAKE 1  CAPSULE ON MONDAY, WEDNESDAY, AND FRIDAY OF EACH WEEK. 01/16/18  Yes Nafziger, Tommi Rumps, NP  carbamide peroxide (DEBROX) 6.5 % OTIC solution Place 5 drops into both ears 2 (two) times daily. Patient taking differently: Place 5 drops 2 (two) times daily as needed into both ears (irritation).  03/22/17  Yes Nafziger, Tommi Rumps, NP  cholecalciferol (VITAMIN D) 1000 units tablet Take 1,000 Units by mouth daily.   Yes [provider]  donepezil (ARICEPT) 10 MG tablet TAKE 1 TABLET ONCE DAILY. 01/17/17  Yes Nafziger, Tommi Rumps, NP  furosemide (LASIX) 20 MG tablet TAKE 1&1/2 TABLETS DAILY. TAKE AN EXTRA 1/2 TABLET DAILY AS NEEDED. 01/11/18  Yes Skeet Latch, MD  glimepiride (AMARYL) 2 MG tablet TAKE 1 TABLET ONCE DAILY. Patient taking differently: No sig reported 04/19/17  Yes Nafziger, Tommi Rumps, NP  guaiFENesin (MUCINEX PO) Take 1 tablet by mouth daily as needed (chest congestion).   Yes [provider]  HYDROcodone-acetaminophen (NORCO) 5-325 MG tablet Take 0.5 tablets by mouth every 8 (eight) hours as needed for moderate pain. 01/30/18 03/01/18 Yes Nafziger, Tommi Rumps, NP  loratadine (ALAVERT) 10 MG tablet Take 5 mg daily by mouth.    Yes [provider]  memantine (NAMENDA) 5 MG tablet TAKE ONE TABLET AT BEDTIME. 12/19/17  Yes Nafziger, Tommi Rumps, NP  metoprolol tartrate (LOPRESSOR) 25 MG tablet Take 1 tablet (25 mg total) by mouth 2 (two) times daily. 07/16/17  Yes Hosie Poisson, MD  Phenazopyridine HCl (AZO TABS PO) Take 1 tablet by mouth 2 (two) times daily.   Yes [provider]  Polyethyl Glycol-Propyl Glycol (SYSTANE) 0.4-0.3 % SOLN Apply 1 drop to eye 2 (two) times daily as needed (dry eyes).   Yes [provider]  polyethylene glycol (MIRALAX / GLYCOLAX) packet Take 17 g by mouth daily as needed for mild constipation, moderate constipation or severe constipation.   Yes [provider]  potassium chloride SA (K-DUR,KLOR-CON) 20 MEQ tablet TAKE 1 TABLET ONCE DAILY. 10/24/17   Yes Skeet Latch, MD  saccharomyces boulardii (FLORASTOR) 250 MG capsule Take 250 mg by mouth 2 (two) times daily.   Yes [provider]  vitamin B-12 (CYANOCOBALAMIN) 500 MCG tablet Take 500 mcg by mouth daily.   Yes [provider]  ACCU-CHEK AVIVA PLUS test strip PATIENT TO CHECK SUGARS ONCE DAILY. 11/19/15   [provider]  donepezil (ARICEPT) 10 MG tablet TAKE 1 TABLET ONCE DAILY. Patient not taking: Reported on 01/30/2018 01/18/18   Dorothyann Peng, NP  fluconazole (DIFLUCAN) 100 MG tablet Take 1 tablet (100 mg total) by mouth daily. Patient not taking: Reported on 01/30/2018 08/07/17   Campbell Riches, MD  Iron-FA-B Cmp-C-Biot-Probiotic (FUSION PLUS) CAPS Take 1 tablet by mouth daily. Patient not taking: Reported on 01/30/2018 01/13/17   Dorothyann Peng, NP  omeprazole (PRILOSEC) 40 MG capsule Take 30- 60 min before your first and last meals of the day Patient not taking: Reported on 01/30/2018 01/05/17   Tanda Rockers, MD  Physical Exam: Vitals:   01/30/18 1649 01/30/18 1922 01/30/18 2132  BP: (!) 179/84 (!) 161/87 (!) 180/81  Pulse: 95 94 (!) 117  Resp: 18 (!) 37 (!) 31  Temp: 97.9 F (36.6 C)    TempSrc: Rectal    SpO2: 98% 98% 98%    Constitutional: Lethargic, opens eyes to voice, not following directions Vitals:   01/30/18 1649 01/30/18 1922 01/30/18 2132  BP: (!) 179/84 (!) 161/87 (!) 180/81  Pulse: 95 94 (!) 117  Resp: 18 (!) 37 (!) 31  Temp: 97.9 F (36.6 C)    TempSrc: Rectal    SpO2: 98% 98% 98%   Eyes: PERRL, lids and conjunctivae normal ENMT: Mucous membranes appear dry, and not fully cooperative with exam.  Neck: normal, no masses, no thyromegaly Respiratory: clear to auscultation bilaterally, no wheezing, no crackles. Normal respiratory effort. No accessory muscle use.  Cardiovascular: Tachycardia irregular rate and rhythm, no murmurs / rubs / gallops. No extremity edema. 2+ pedal pulses. No carotid bruits.  Abdomen: no  tenderness, no masses palpated. No hepatosplenomegaly. Bowel sounds positive. ~3 by ~3cm wound in coccyx area, no drainage no surrounding tenderness fluctuance, minimal serosanguineous drainage on dressing, otherwise clean.  Gauze packing in wound.  Mild hyperemia inferior to wound, likely from pressure.  Musculoskeletal: no clubbing / cyanosis. No joint deformity upper and lower extremities. Good ROM, no contractures. Normal muscle tone.  Skin: no rashes, lesions, ulcers. No induration Neurologic: Not cooperative with exam, but moving all extremities to pain,  Psychiatric: lethargic, significant dementia no responding to questions  Labs on Admission: I have personally reviewed following labs and imaging studies  CBC: Recent Labs  Lab 01/30/18 1635  WBC 9.8  HGB 15.9*  HCT 48.8*  MCV 94.8  PLT 427   Basic Metabolic Panel: Recent Labs  Lab 01/30/18 1635  NA 145  K 3.6  CL 107  CO2 26  GLUCOSE 225*  BUN 34*  CREATININE 1.18*  CALCIUM 10.3   Liver Function Tests: Recent Labs  Lab 01/30/18 1649  AST 25  ALT 13*  ALKPHOS 82  BILITOT 0.8  PROT 7.3  ALBUMIN 4.0   CBG: Recent Labs  Lab 01/30/18 1656  GLUCAP 171*   Urine analysis:    Component Value Date/Time   COLORURINE YELLOW 01/30/2018 2009   APPEARANCEUR CLEAR 01/30/2018 2009   LABSPEC 1.016 01/30/2018 2009   PHURINE 5.0 01/30/2018 2009   GLUCOSEU NEGATIVE 01/30/2018 2009   HGBUR NEGATIVE 01/30/2018 2009   BILIRUBINUR NEGATIVE 01/30/2018 2009   BILIRUBINUR Negative 02/11/2017 1234   KETONESUR 5 (A) 01/30/2018 2009   PROTEINUR NEGATIVE 01/30/2018 2009   UROBILINOGEN 0.2 02/11/2017 1234   UROBILINOGEN 0.2 01/27/2015 1353   NITRITE NEGATIVE 01/30/2018 2009   LEUKOCYTESUR LARGE (A) 01/30/2018 2009    Radiological Exams on Admission: Ct Head Wo Contrast  Result Date: 01/30/2018 CLINICAL DATA:  Altered mental status, altered level of consciousness today, less responsive, history Alzheimer's, atrial  fibrillation, bronchitis, CHF, stage III chronic renal disease, hypertension EXAM: CT HEAD WITHOUT CONTRAST TECHNIQUE: Contiguous axial images were obtained from the base of the skull through the vertex without intravenous contrast. Sagittal and coronal MPR images reconstructed from axial data set. COMPARISON:  12/30/2016 FINDINGS: Brain: Generalized atrophy. Normal ventricular morphology. No midline shift or mass effect. Minimal vessel chronic ischemic changes of deep cerebral white matter. No intracranial hemorrhage, mass lesion, evidence of acute infarction, or extra-axial fluid collection. Vascular: No hyperdense vessels. Minimal atherosclerotic calcification of internal carotid  arteries at skull base. Skull: Intact Sinuses/Orbits: Clear Other: N/A IMPRESSION: Atrophy with minimal small vessel chronic ischemic changes of deep cerebral white matter. No acute intracranial abnormalities. Electronically Signed   By: Lavonia Dana M.D.   On: 01/30/2018 19:08   Dg Chest Port 1 View  Result Date: 01/30/2018 CLINICAL DATA:  Diaphoresis, lethargy, history Alzheimer's, atrial fibrillation, CHF, stage III chronic renal insufficiency, diabetes mellitus, hypertension, recent surgery for coccygeal abscess EXAM: PORTABLE CHEST 1 VIEW COMPARISON:  Portable exam 1844 hours compared to 07/27/2017 FINDINGS: Enlargement of cardiac silhouette with pulmonary vascular congestion. Atherosclerotic calcification aorta. Patchy opacity at LEFT base could represent infiltrate or atelectasis in LEFT lower lobe. Remaining lungs grossly clear. Skin fold projects over LEFT upper chest. No pneumothorax or gross pleural effusion. Bones demineralized. IMPRESSION: Mild patchy LEFT basilar opacity question mild atelectasis or infiltrate in LEFT lower lobe. Electronically Signed   By: Lavonia Dana M.D.   On: 01/30/2018 18:59    EKG: Independently reviewed.  Atrial fibrillation, LBBB.  Consistent with prior EKGs  Assessment/Plan Active  Problems:   Benign essential HTN   DM (diabetes mellitus) type II controlled with renal manifestation (HCC)   Alzheimer's dementia with behavioral disturbance   Pneumonia   Metabolic encephalopathy-likely multifactorial, possible Pneumonia, UTI, dehydration. -Antibiotics, hydrate -follow blood and urine cultures -N.p.o. for now  UTI-UA suggestive.  Lethargy.  WBC 9.8.  IV ceftriaxone started in ED. Lactic acid- 4.7 > 5.4. IV ceftriaxone started in the ED. No history of drug-resistant UTIs.  Lives at home.  -IV antibiotics with Zosyn for aspiration pneumonia and better UTI coverage,(unfortunately initially started Unasyn, will switch) -F/u Urine and blood cultures -And lactic acid  Possible pneumonia-tachypnea, cough, daughter reports choking on coughing with water intake.  Portable chest x-ray- LLL infiltrate Vs atelectasis,.  WBC 9.8.  Lactic acidosis. -Swallow evaluation -zosyn for possible aspiration, and cont azithromycin for possible CAP - F/u blood cultures  Atrial fibrillation-now with RVR, rates initially 86 up to 147.  Last echo 08/2016-EF 35 to 40%, diffuse hypokinesis.  - EKG -IV metoprolol 5 mg x 1 without significant improvement -Cardizem drip, for rate control -Daughter requesting cardiology consult in the morning  Chronic wound- s/p coccyx abscess surgery and prolonged antibiotics.  Wound appears to be healing, does not appear infected, mouth serosanguineous drainage.  Has followed up with surgery and ID.  Last ID visit- 10/2017. Daughter reports last surgery visit 2 weeks ago. -Wound Care consult  CHF-diastolic and systolic, patient appearing hypovolemic, dry mucous membranes, lactic acidosis. Last echo 08/2016-EF 35 to 40%, diffuse hypokinesis.  -Hold Lasix -Hydrate  Alzhemier's Dementia -Physical therapy when able -With reported aspiration history current G hold home p.o. Medications-donepezil, amantadine  HTN-pressure elevated -Cardizem drip for rate  control -N.p.o. for now  DVT prophylaxis: Lovenox Code Status: Full Family Communication: Daughter [name] is HCPOA, confirmed full code status. Disposition Plan: Per rounding team Consults called: none  Admission status: Inpt, Step down along Cardizem drip   Bethena Roys MD [facility] Pager [phone] From 6PM-6AM.  Otherwise please contact night-coverage [email or web address] Password [email or web address]  10/11/2017, 12:06 AM    01/30/2018, 11:53 PM

## 2018-01-30 NOTE — ED Triage Notes (Signed)
Pt arrived from home via EMS per pt dtr is more diaphoretic and lethargic than usual. Pt had recent surgery on coccyx, and area was infected per EMS.     20G left hand.

## 2018-01-30 NOTE — ED Notes (Signed)
Dr. Roderic Palau made aware of critical lactic value of 4.75

## 2018-01-30 NOTE — Telephone Encounter (Signed)
Copied from [place] [id number]. Topic: Quick Communication - Rx Refill/Question >> Jan 30, 2018  9:51 AM Lennox Solders wrote: Medication: pt needs new rx hydrocodone. Pt will be out on Monday morning Has the patient contacted their pharmacy? no (Agent: If yes, when and what did the pharmacy advise?)  Preferred Pharmacy (with phone number or street name): [facility] Agent: Please be advised that RX refills may take up to 3 business days. We ask that you follow-up with your pharmacy.

## 2018-01-30 NOTE — ED Provider Notes (Signed)
[facility] DEPT Provider Note   CSN: [id number] Arrival date & time: 01/30/18  1619     History   Chief Complaint Chief Complaint  Patient presents with  . Weakness    HPI Anna [name] is a 84 y.o. female.  Patient is brought into the emergency room by her daughter because of weakness and lethargy for last couple days she has not been drinking any fluids for 2 days  The history is provided by a relative. No language interpreter was used.  Weakness  Primary symptoms include no focal weakness. This is a new problem. The current episode started 12 to 24 hours ago. The problem has not changed since onset.There was no focality noted. There has been no fever. The fever has been present for 5 days or more. Associated symptoms include confusion.    Past Medical History:  Diagnosis Date  . Alzheimer's disease   . Atrial fibrillation ([place])   . Bronchitis   . CHF (congestive heart failure) ([place])   . Chronic renal insufficiency, stage III (moderate) (HCC)   . Diabetes mellitus without complication ([place])   . Dyspnea    with exertion  . GERD (gastroesophageal reflux disease)   . GI bleeding 11/2013   due to supratherapeutic INR  . Hyperlipidemia   . Hypertension   . Hyperthyroidism    on amiodarone  . LBBB (left bundle branch block)   . Overactive bladder   . Overweight   . Persistent atrial fibrillation ([place])   . Pneumonia   . Thyroid mass    LLL  . UTI (urinary tract infection)   . Vitamin B12 deficiency   . Vitamin D deficiency     Patient Active Problem List   Diagnosis Date Noted  . Acute metabolic encephalopathy 90/24/0973  . Alzheimer's dementia with behavioral disturbance 07/10/2017  . Pilonidal abscess 07/10/2017  . Abscess of coccyx ([place]) 07/10/2017  . Abnormal urine sediment 02/11/2017  . Upper airway cough syndrome 01/06/2017  . Acute on chronic diastolic heart failure ([place])   . Atrial fibrillation ([place]) 09/15/2016  .  Atrial fibrillation with RVR ([place]) 09/15/2016  . Near syncope 02/05/2016  . Pre-syncope 02/05/2016  . Chest pain   . Diabetes mellitus with complication ([place])   . Hypokalemia 08/28/2015  . UTI (lower urinary tract infection) 08/26/2015  . Generalized weakness 08/26/2015  . Acute combined systolic and diastolic heart failure ([place]) 02/16/2015  . Hyperthyroidism 01/28/2015  . Dyspnea 01/27/2015  . SOB (shortness of breath) 01/27/2015  . Failure to thrive in adult 01/27/2015  . Benign essential HTN 01/27/2015  . DM (diabetes mellitus) type II controlled with renal manifestation ([place]) 01/27/2015  . CKD (chronic kidney disease), stage III ([place]) 01/27/2015    Past Surgical History:  Procedure Laterality Date  . ABDOMINAL HYSTERECTOMY    . ESOPHAGOGASTRODUODENOSCOPY (EGD) WITH PROPOFOL N/A 02/16/2017   Procedure: ESOPHAGOGASTRODUODENOSCOPY (EGD) WITH PROPOFOL;  Surgeon: Irene Shipper, MD;  Location: [facility] ENDOSCOPY;  Service: Endoscopy;  Laterality: N/A;  . IR FLUORO GUIDE CV MIDLINE PICC RIGHT  07/21/2017  . IR US GUIDE VASC ACCESS RIGHT  07/21/2017  . IRRIGATION AND DEBRIDEMENT ABSCESS N/A 07/11/2017   Procedure: IRRIGATION AND DEBRIDEMENT OF SACRAL ABSCESS;  Surgeon: Ralene Ok, MD;  Location: [facility] ORS;  Service: General;  Laterality: N/A;  SACRUM     OB History   None      Home Medications    Prior to Admission medications   Medication Sig Start Date  End Date Taking? Authorizing Provider  aspirin 325 MG tablet Take 325 mg by mouth daily.   Yes [provider]  calcitRIOL (ROCALTROL) 0.5 MCG capsule TAKE 1 CAPSULE ON MONDAY, WEDNESDAY, AND FRIDAY OF EACH WEEK. 01/16/18  Yes Nafziger, Tommi Rumps, NP  carbamide peroxide (DEBROX) 6.5 % OTIC solution Place 5 drops into both ears 2 (two) times daily. Patient taking differently: Place 5 drops 2 (two) times daily as needed into both ears (irritation).  03/22/17  Yes Nafziger, Tommi Rumps, NP  cholecalciferol (VITAMIN D) 1000 units tablet Take  1,000 Units by mouth daily.   Yes [provider]  donepezil (ARICEPT) 10 MG tablet TAKE 1 TABLET ONCE DAILY. 01/17/17  Yes Nafziger, Tommi Rumps, NP  furosemide (LASIX) 20 MG tablet TAKE 1&1/2 TABLETS DAILY. TAKE AN EXTRA 1/2 TABLET DAILY AS NEEDED. 01/11/18  Yes Skeet Latch, MD  glimepiride (AMARYL) 2 MG tablet TAKE 1 TABLET ONCE DAILY. Patient taking differently: No sig reported 04/19/17  Yes Nafziger, Tommi Rumps, NP  guaiFENesin (MUCINEX PO) Take 1 tablet by mouth daily as needed (chest congestion).   Yes [provider]  HYDROcodone-acetaminophen (NORCO) 5-325 MG tablet Take 0.5 tablets by mouth every 8 (eight) hours as needed for moderate pain. 01/30/18 03/01/18 Yes Nafziger, Tommi Rumps, NP  loratadine (ALAVERT) 10 MG tablet Take 5 mg daily by mouth.    Yes [provider]  memantine (NAMENDA) 5 MG tablet TAKE ONE TABLET AT BEDTIME. 12/19/17  Yes Nafziger, Tommi Rumps, NP  metoprolol tartrate (LOPRESSOR) 25 MG tablet Take 1 tablet (25 mg total) by mouth 2 (two) times daily. 07/16/17  Yes Hosie Poisson, MD  Phenazopyridine HCl (AZO TABS PO) Take 1 tablet by mouth 2 (two) times daily.   Yes [provider]  Polyethyl Glycol-Propyl Glycol (SYSTANE) 0.4-0.3 % SOLN Apply 1 drop to eye 2 (two) times daily as needed (dry eyes).   Yes [provider]  polyethylene glycol (MIRALAX / GLYCOLAX) packet Take 17 g by mouth daily as needed for mild constipation, moderate constipation or severe constipation.   Yes [provider]  potassium chloride SA (K-DUR,KLOR-CON) 20 MEQ tablet TAKE 1 TABLET ONCE DAILY. 10/24/17  Yes Skeet Latch, MD  saccharomyces boulardii (FLORASTOR) 250 MG capsule Take 250 mg by mouth 2 (two) times daily.   Yes [provider]  vitamin B-12 (CYANOCOBALAMIN) 500 MCG tablet Take 500 mcg by mouth daily.   Yes [provider]  ACCU-CHEK AVIVA PLUS test strip PATIENT TO CHECK SUGARS ONCE DAILY. 11/19/15   [provider]  donepezil  (ARICEPT) 10 MG tablet TAKE 1 TABLET ONCE DAILY. Patient not taking: Reported on 01/30/2018 01/18/18   Dorothyann Peng, NP  fluconazole (DIFLUCAN) 100 MG tablet Take 1 tablet (100 mg total) by mouth daily. Patient not taking: Reported on 01/30/2018 08/07/17   Campbell Riches, MD  Iron-FA-B Cmp-C-Biot-Probiotic (FUSION PLUS) CAPS Take 1 tablet by mouth daily. Patient not taking: Reported on 01/30/2018 01/13/17   Dorothyann Peng, NP  omeprazole (PRILOSEC) 40 MG capsule Take 30- 60 min before your first and last meals of the day Patient not taking: Reported on 01/30/2018 01/05/17   Tanda Rockers, MD    Family History Family History  Problem Relation Age of Onset  . Heart disease Father   . Diabetes Mother   . Thyroid disease Neg Hx     Social History Social History   Tobacco Use  . Smoking status: Never Smoker  . Smokeless tobacco: Never Used  Substance Use Topics  .  Alcohol use: No    Alcohol/week: 0.0 oz  . Drug use: No     Allergies   Pollen extract   Review of Systems Review of Systems  Unable to perform ROS: Dementia  Neurological: Positive for weakness. Negative for focal weakness.  Psychiatric/Behavioral: Positive for confusion.     Physical Exam Updated Vital Signs BP (!) 180/81   Pulse (!) 117   Temp 97.9 F (36.6 C) (Rectal)   Resp (!) 31   SpO2 98%   Physical Exam  Constitutional: She appears well-developed.  HENT:  Head: Normocephalic.  His membranes dry  Eyes: Conjunctivae and EOM are normal. No scleral icterus.  Neck: Neck supple. No thyromegaly present.  Cardiovascular: Normal rate and regular rhythm. Exam reveals no gallop and no friction rub.  No murmur heard. Pulmonary/Chest: No stridor. She has no wheezes. She has no rales. She exhibits no tenderness.  Abdominal: She exhibits no distension. There is no tenderness. There is no rebound.  Musculoskeletal: Normal range of motion. She exhibits no edema.  Lymphadenopathy:    She has no cervical  adenopathy.  Neurological: She exhibits normal muscle tone. Coordination normal.  Patient lethargic but will open her eyes to verbal stimuli  Skin: No rash noted. No erythema.     ED Treatments / Results  Labs (all labs ordered are listed, but only abnormal results are displayed) Labs Reviewed  BASIC METABOLIC PANEL - Abnormal; Notable for the following components:      Result Value   Glucose, Bld 225 (*)    BUN 34 (*)    Creatinine, Ser 1.18 (*)    GFR calc non Af Amer 40 (*)    GFR calc Af Amer 47 (*)    All other components within normal limits  CBC - Abnormal; Notable for the following components:   RBC 5.15 (*)    Hemoglobin 15.9 (*)    HCT 48.8 (*)    All other components within normal limits  URINALYSIS, ROUTINE W REFLEX MICROSCOPIC - Abnormal; Notable for the following components:   Ketones, ur 5 (*)    Leukocytes, UA LARGE (*)    Bacteria, UA MANY (*)    All other components within normal limits  HEPATIC FUNCTION PANEL - Abnormal; Notable for the following components:   ALT 13 (*)    All other components within normal limits  CBG MONITORING, ED - Abnormal; Notable for the following components:   Glucose-Capillary 171 (*)    All other components within normal limits  I-STAT CG4 LACTIC ACID, ED - Abnormal; Notable for the following components:   Lactic Acid, Venous 4.75 (*)    All other components within normal limits  URINE CULTURE  I-STAT TROPONIN, ED  I-STAT CG4 LACTIC ACID, ED    EKG EKG Interpretation  Date/Time:  Tuesday January 30 2018 16:34:30 EDT Ventricular Rate:  93 PR Interval:    QRS Duration: 163 QT Interval:  349 QTC Calculation: 435 R Axis:   38 Text Interpretation:  Atrial fibrillation Left bundle branch block Confirmed by [place] Ferguson [phone]) on 01/30/2018 9:59:22 PM   Radiology Ct Head Wo Contrast  Result Date: 01/30/2018 CLINICAL DATA:  Altered mental status, altered level of consciousness today, less responsive, history Alzheimer's,  atrial fibrillation, bronchitis, CHF, stage III chronic renal disease, hypertension EXAM: CT HEAD WITHOUT CONTRAST TECHNIQUE: Contiguous axial images were obtained from the base of the skull through the vertex without intravenous contrast. Sagittal and coronal MPR images reconstructed from axial data set.  COMPARISON:  12/30/2016 FINDINGS: Brain: Generalized atrophy. Normal ventricular morphology. No midline shift or mass effect. Minimal vessel chronic ischemic changes of deep cerebral white matter. No intracranial hemorrhage, mass lesion, evidence of acute infarction, or extra-axial fluid collection. Vascular: No hyperdense vessels. Minimal atherosclerotic calcification of internal carotid arteries at skull base. Skull: Intact Sinuses/Orbits: Clear Other: N/A IMPRESSION: Atrophy with minimal small vessel chronic ischemic changes of deep cerebral white matter. No acute intracranial abnormalities. Electronically Signed   By: Lavonia Dana M.D.   On: 01/30/2018 19:08   Dg Chest Port 1 View  Result Date: 01/30/2018 CLINICAL DATA:  Diaphoresis, lethargy, history Alzheimer's, atrial fibrillation, CHF, stage III chronic renal insufficiency, diabetes mellitus, hypertension, recent surgery for coccygeal abscess EXAM: PORTABLE CHEST 1 VIEW COMPARISON:  Portable exam 1844 hours compared to 07/27/2017 FINDINGS: Enlargement of cardiac silhouette with pulmonary vascular congestion. Atherosclerotic calcification aorta. Patchy opacity at LEFT base could represent infiltrate or atelectasis in LEFT lower lobe. Remaining lungs grossly clear. Skin fold projects over LEFT upper chest. No pneumothorax or gross pleural effusion. Bones demineralized. IMPRESSION: Mild patchy LEFT basilar opacity question mild atelectasis or infiltrate in LEFT lower lobe. Electronically Signed   By: Lavonia Dana M.D.   On: 01/30/2018 18:59    Procedures Procedures (including critical care time)  Medications Ordered in ED Medications  naloxone  (NARCAN) injection 1 mg (1 mg Intravenous Refused 01/30/18 1945)  cefTRIAXone (ROCEPHIN) 1 g in sodium chloride 0.9 % 100 mL IVPB (has no administration in time range)  sodium chloride 0.9 % bolus 500 mL (has no administration in time range)  azithromycin (ZITHROMAX) 500 mg in sodium chloride 0.9 % 250 mL IVPB (has no administration in time range)  sodium chloride 0.9 % bolus 500 mL (0 mLs Intravenous Stopped 01/30/18 1940)     Initial Impression / Assessment and Plan / ED Course  I have reviewed the triage vital signs and the nursing notes.  Pertinent labs & imaging results that were available during my care of the patient were reviewed by me and considered in my medical decision making (see chart for details). CRITICAL CARE Performed by: [place] Ferguson Total critical care time:40 minutes Critical care time was exclusive of separately billable procedures and treating other patients. Critical care was necessary to treat or prevent imminent or life-threatening deterioration. Critical care was time spent personally by me on the following activities: development of treatment plan with patient and/or surrogate as well as nursing, discussions with consultants, evaluation of patient's response to treatment, examination of patient, obtaining history from patient or surrogate, ordering and performing treatments and interventions, ordering and review of laboratory studies, ordering and review of radiographic studies, pulse oximetry and re-evaluation of patient's condition.     Labs show patient is dehydrated and has urinary tract infection and possible pneumonia.  She will be treated for UTI and pneumonia and admitted by medicine  Final Clinical Impressions(s) / ED Diagnoses   Final diagnoses:  None    ED Discharge Orders    None       [place] Ferguson, MD 01/30/18 2206

## 2018-01-31 ENCOUNTER — Encounter (HOSPITAL_COMMUNITY)
Admission: EM | Disposition: A | Discharge status: Hospice | Payer: Self-pay | Source: Home / Self Care | Attending: Internal Medicine

## 2018-01-31 ENCOUNTER — Other Ambulatory Visit: Payer: Self-pay

## 2018-01-31 ENCOUNTER — Inpatient Hospital Stay (HOSPITAL_COMMUNITY): Payer: Medicare HMO

## 2018-01-31 ENCOUNTER — Inpatient Hospital Stay (HOSPITAL_COMMUNITY): Payer: Medicare HMO | Admitting: Certified Registered"

## 2018-01-31 ENCOUNTER — Encounter (HOSPITAL_COMMUNITY): Payer: Self-pay | Admitting: Radiology

## 2018-01-31 DIAGNOSIS — E1121 Type 2 diabetes mellitus with diabetic nephropathy: Secondary | ICD-10-CM

## 2018-01-31 DIAGNOSIS — I2699 Other pulmonary embolism without acute cor pulmonale: Secondary | ICD-10-CM

## 2018-01-31 DIAGNOSIS — A419 Sepsis, unspecified organism: Secondary | ICD-10-CM

## 2018-01-31 DIAGNOSIS — I499 Cardiac arrhythmia, unspecified: Secondary | ICD-10-CM | POA: Diagnosis not present

## 2018-01-31 DIAGNOSIS — I4891 Unspecified atrial fibrillation: Secondary | ICD-10-CM

## 2018-01-31 DIAGNOSIS — N3000 Acute cystitis without hematuria: Secondary | ICD-10-CM

## 2018-01-31 DIAGNOSIS — N390 Urinary tract infection, site not specified: Secondary | ICD-10-CM

## 2018-01-31 DIAGNOSIS — K55069 Acute infarction of intestine, part and extent unspecified: Secondary | ICD-10-CM | POA: Diagnosis present

## 2018-01-31 DIAGNOSIS — J690 Pneumonitis due to inhalation of food and vomit: Secondary | ICD-10-CM

## 2018-01-31 DIAGNOSIS — L8990 Pressure ulcer of unspecified site, unspecified stage: Secondary | ICD-10-CM

## 2018-01-31 DIAGNOSIS — G309 Alzheimer's disease, unspecified: Secondary | ICD-10-CM

## 2018-01-31 DIAGNOSIS — I5022 Chronic systolic (congestive) heart failure: Secondary | ICD-10-CM

## 2018-01-31 DIAGNOSIS — R6520 Severe sepsis without septic shock: Secondary | ICD-10-CM

## 2018-01-31 DIAGNOSIS — G9341 Metabolic encephalopathy: Secondary | ICD-10-CM

## 2018-01-31 DIAGNOSIS — I5033 Acute on chronic diastolic (congestive) heart failure: Secondary | ICD-10-CM

## 2018-01-31 HISTORY — PX: MESENTERIC ARTERY BYPASS: SHX5968

## 2018-01-31 HISTORY — PX: AORTOGRAM: SHX6300

## 2018-01-31 LAB — I-STAT CG4 LACTIC ACID, ED: LACTIC ACID, VENOUS: 5.42 mmol/L — AB (ref 0.5–1.9)

## 2018-01-31 LAB — GLUCOSE, CAPILLARY
GLUCOSE-CAPILLARY: 337 mg/dL — AB (ref 65–99)
GLUCOSE-CAPILLARY: 256 mg/dL — AB (ref 65–99)
Glucose-Capillary: 364 mg/dL — ABNORMAL HIGH (ref 65–99)

## 2018-01-31 LAB — CBC
HCT: 48.5 % — ABNORMAL HIGH (ref 36.0–46.0)
HEMOGLOBIN: 15.7 g/dL — AB (ref 12.0–15.0)
MCH: 30.5 pg (ref 26.0–34.0)
MCHC: 32.4 g/dL (ref 30.0–36.0)
MCV: 94.4 fL (ref 78.0–100.0)
Platelets: 251 10*3/uL (ref 150–400)
RBC: 5.14 MIL/uL — AB (ref 3.87–5.11)
RDW: 15.2 % (ref 11.5–15.5)
WBC: 26.2 10*3/uL — AB (ref 4.0–10.5)

## 2018-01-31 LAB — BASIC METABOLIC PANEL
ANION GAP: 15 (ref 5–15)
Anion gap: 9 (ref 5–15)
BUN: 34 mg/dL — ABNORMAL HIGH (ref 6–20)
BUN: 33 mg/dL — ABNORMAL HIGH (ref 6–20)
CALCIUM: 9.7 mg/dL (ref 8.9–10.3)
CALCIUM: 9.1 mg/dL (ref 8.9–10.3)
CO2: 18 mmol/L — ABNORMAL LOW (ref 22–32)
CO2: 19 mmol/L — ABNORMAL LOW (ref 22–32)
Chloride: 116 mmol/L — ABNORMAL HIGH (ref 101–111)
Chloride: 115 mmol/L — ABNORMAL HIGH (ref 101–111)
Creatinine, Ser: 1.1 mg/dL — ABNORMAL HIGH (ref 0.44–1.00)
Creatinine, Ser: 1 mg/dL (ref 0.44–1.00)
GFR, EST AFRICAN AMERICAN: 51 mL/min — AB (ref >60)
GFR, EST AFRICAN AMERICAN: 57 mL/min — AB (ref >60)
GFR, EST NON AFRICAN AMERICAN: 44 mL/min — AB (ref >60)
GFR, EST NON AFRICAN AMERICAN: 49 mL/min — AB (ref >60)
GLUCOSE: 294 mg/dL — AB (ref 65–99)
Glucose, Bld: 361 mg/dL — ABNORMAL HIGH (ref 65–99)
Potassium: 3.8 mmol/L (ref 3.5–5.1)
Potassium: 3.3 mmol/L — ABNORMAL LOW (ref 3.5–5.1)
SODIUM: 149 mmol/L — AB (ref 135–145)
SODIUM: 143 mmol/L (ref 135–145)

## 2018-01-31 LAB — LACTIC ACID, PLASMA
LACTIC ACID, VENOUS: 6.3 mmol/L — AB (ref 0.5–1.9)
LACTIC ACID, VENOUS: 3.2 mmol/L — AB (ref 0.5–1.9)
LACTIC ACID, VENOUS: 3.2 mmol/L — AB (ref 0.5–1.9)

## 2018-01-31 LAB — MRSA PCR SCREENING: MRSA by PCR: NEGATIVE

## 2018-01-31 LAB — POCT I-STAT 4, (NA,K, GLUC, HGB,HCT)
Glucose, Bld: 197 mg/dL — ABNORMAL HIGH (ref 65–99)
HCT: 43 % (ref 36.0–46.0)
Hemoglobin: 14.6 g/dL (ref 12.0–15.0)
Potassium: 2.8 mmol/L — ABNORMAL LOW (ref 3.5–5.1)
Sodium: 149 mmol/L — ABNORMAL HIGH (ref 135–145)

## 2018-01-31 LAB — D-DIMER, QUANTITATIVE (NOT AT ARMC): D DIMER QUANT: 4.11 ug{FEU}/mL — AB (ref 0.00–0.50)

## 2018-01-31 LAB — HEMOGLOBIN A1C
HEMOGLOBIN A1C: 6 % — AB (ref 4.8–5.6)
Mean Plasma Glucose: 125.5 mg/dL

## 2018-01-31 LAB — PROCALCITONIN: Procalcitonin: 1.34 ng/mL

## 2018-01-31 LAB — TSH: TSH: 0.206 u[IU]/mL — ABNORMAL LOW (ref 0.350–4.500)

## 2018-01-31 LAB — TROPONIN I: Troponin I: 0.21 ng/mL

## 2018-01-31 SURGERY — CREATION, BYPASS, ARTERIAL, MESENTERIC
Anesthesia: Monitor Anesthesia Care

## 2018-01-31 MED ORDER — IOPAMIDOL (ISOVUE-300) INJECTION 61%
INTRAVENOUS | Status: AC
  Filled 2018-01-31: qty 30

## 2018-01-31 MED ORDER — POTASSIUM CHLORIDE 10 MEQ/100ML IV SOLN
10.0000 meq | INTRAVENOUS | Status: AC
  Administered 2018-02-01 (×4): 10 meq via INTRAVENOUS
  Filled 2018-01-31 (×4): qty 100

## 2018-01-31 MED ORDER — ONDANSETRON HCL 4 MG PO TABS: 4.0000 mg | ORAL_TABLET | Freq: Four times a day (QID) | ORAL | Status: DC | PRN

## 2018-01-31 MED ORDER — IODIXANOL 320 MG/ML IV SOLN
INTRAVENOUS | Status: DC | PRN
  Administered 2018-01-31: 150 mL via INTRAVENOUS

## 2018-01-31 MED ORDER — METOPROLOL TARTRATE 25 MG PO TABS: 25.0000 mg | ORAL_TABLET | Freq: Two times a day (BID) | ORAL | Status: DC

## 2018-01-31 MED ORDER — ONDANSETRON HCL 4 MG/2ML IJ SOLN: 4.0000 mg | Freq: Four times a day (QID) | INTRAMUSCULAR | Status: DC | PRN

## 2018-01-31 MED ORDER — IOPAMIDOL (ISOVUE-300) INJECTION 61%: 15.0000 mL | Freq: Once | INTRAVENOUS | Status: AC | PRN

## 2018-01-31 MED ORDER — LIDOCAINE-EPINEPHRINE (PF) 1 %-1:200000 IJ SOLN
INTRAMUSCULAR | Status: DC | PRN
  Administered 2018-01-31: 30 mL

## 2018-01-31 MED ORDER — SODIUM CHLORIDE 0.9 % IV SOLN
INTRAVENOUS | Status: DC | PRN
  Administered 2018-01-31: 500 mL

## 2018-01-31 MED ORDER — INSULIN ASPART 100 UNIT/ML ~~LOC~~ SOLN
0.0000 [IU] | Freq: Three times a day (TID) | SUBCUTANEOUS | Status: DC
  Administered 2018-01-31: 9 [IU] via SUBCUTANEOUS
  Administered 2018-01-31: 7 [IU] via SUBCUTANEOUS

## 2018-01-31 MED ORDER — DILTIAZEM HCL-DEXTROSE 100-5 MG/100ML-% IV SOLN (PREMIX)
5.0000 mg/h | INTRAVENOUS | Status: DC
  Administered 2018-01-31 (×2): 5 mg/h via INTRAVENOUS
  Administered 2018-02-01 – 2018-02-02 (×4): 10 mg/h via INTRAVENOUS
  Administered 2018-02-03: 7.5 mg/h via INTRAVENOUS
  Administered 2018-02-03 – 2018-02-05 (×3): 5 mg/h via INTRAVENOUS
  Administered 2018-02-06 (×2): 15 mg/h via INTRAVENOUS
  Filled 2018-01-31 (×15): qty 100

## 2018-01-31 MED ORDER — INSULIN ASPART 100 UNIT/ML ~~LOC~~ SOLN
0.0000 [IU] | SUBCUTANEOUS | Status: DC
  Administered 2018-01-31: 8 [IU] via SUBCUTANEOUS
  Administered 2018-02-01: 3 [IU] via SUBCUTANEOUS
  Administered 2018-02-01 (×3): 2 [IU] via SUBCUTANEOUS
  Administered 2018-02-01 (×2): 3 [IU] via SUBCUTANEOUS
  Administered 2018-02-02: 2 [IU] via SUBCUTANEOUS
  Administered 2018-02-02: 3 [IU] via SUBCUTANEOUS
  Administered 2018-02-02: 5 [IU] via SUBCUTANEOUS
  Administered 2018-02-03 (×3): 3 [IU] via SUBCUTANEOUS
  Administered 2018-02-03: 2 [IU] via SUBCUTANEOUS
  Administered 2018-02-03: 3 [IU] via SUBCUTANEOUS
  Administered 2018-02-03: 2 [IU] via SUBCUTANEOUS
  Administered 2018-02-04: 3 [IU] via SUBCUTANEOUS
  Administered 2018-02-04 (×2): 2 [IU] via SUBCUTANEOUS
  Administered 2018-02-04: 3 [IU] via SUBCUTANEOUS
  Administered 2018-02-05: 2 [IU] via SUBCUTANEOUS
  Administered 2018-02-05: 3 [IU] via SUBCUTANEOUS
  Administered 2018-02-05: 2 [IU] via SUBCUTANEOUS
  Administered 2018-02-05: 3 [IU] via SUBCUTANEOUS
  Administered 2018-02-05 – 2018-02-06 (×2): 2 [IU] via SUBCUTANEOUS
  Administered 2018-02-06: 3 [IU] via SUBCUTANEOUS
  Administered 2018-02-06: 2 [IU] via SUBCUTANEOUS

## 2018-01-31 MED ORDER — ACETAMINOPHEN 650 MG RE SUPP
650.0000 mg | Freq: Four times a day (QID) | RECTAL | Status: DC | PRN
  Administered 2018-02-07: 650 mg via RECTAL
  Filled 2018-01-31 (×2): qty 1

## 2018-01-31 MED ORDER — SODIUM CHLORIDE 0.9 % IV SOLN
500.0000 mg | INTRAVENOUS | Status: DC
  Administered 2018-01-31: 500 mg via INTRAVENOUS
  Filled 2018-01-31: qty 500

## 2018-01-31 MED ORDER — ORAL CARE MOUTH RINSE
15.0000 mL | Freq: Two times a day (BID) | OROMUCOSAL | Status: DC
  Administered 2018-01-31 – 2018-02-06 (×8): 15 mL via OROMUCOSAL

## 2018-01-31 MED ORDER — METOPROLOL TARTRATE 5 MG/5ML IV SOLN: 5.0000 mg | INTRAVENOUS | Status: DC | PRN

## 2018-01-31 MED ORDER — INSULIN GLARGINE 100 UNIT/ML ~~LOC~~ SOLN
8.0000 [IU] | Freq: Every day | SUBCUTANEOUS | Status: DC
  Administered 2018-01-31: 8 [IU] via SUBCUTANEOUS
  Filled 2018-01-31 (×2): qty 0.08

## 2018-01-31 MED ORDER — ENOXAPARIN SODIUM 40 MG/0.4ML ~~LOC~~ SOLN: 40.0000 mg | SUBCUTANEOUS | Status: DC

## 2018-01-31 MED ORDER — PIPERACILLIN-TAZOBACTAM 3.375 G IVPB
3.3750 g | Freq: Three times a day (TID) | INTRAVENOUS | Status: DC
  Administered 2018-01-31 (×2): 3.375 g via INTRAVENOUS
  Filled 2018-01-31 (×3): qty 50

## 2018-01-31 MED ORDER — SODIUM CHLORIDE 0.9 % IV SOLN
INTRAVENOUS | Status: DC | PRN
  Administered 2018-01-31: 21:00:00 via INTRAVENOUS

## 2018-01-31 MED ORDER — DEXTROSE-NACL 5-0.2 % IV SOLN
INTRAVENOUS | Status: DC
  Administered 2018-01-31: 16:00:00 via INTRAVENOUS

## 2018-01-31 MED ORDER — ALTEPLASE 2 MG IJ SOLR
4.0000 mg | Freq: Once | INTRAMUSCULAR | Status: DC
  Filled 2018-01-31: qty 4

## 2018-01-31 MED ORDER — SODIUM CHLORIDE 0.45 % IV SOLN
INTRAVENOUS | Status: DC
  Administered 2018-02-01: 50 mL/h via INTRAVENOUS
  Administered 2018-02-02 – 2018-02-03 (×2): via INTRAVENOUS

## 2018-01-31 MED ORDER — IOPAMIDOL (ISOVUE-370) INJECTION 76%
INTRAVENOUS | Status: AC
  Filled 2018-01-31: qty 100

## 2018-01-31 MED ORDER — SODIUM CHLORIDE 0.9 % IR SOLN
Status: DC | PRN
  Administered 2018-01-31: 1000 mL

## 2018-01-31 MED ORDER — POTASSIUM CHLORIDE 10 MEQ/100ML IV SOLN: 10.0000 meq | INTRAVENOUS | Status: DC

## 2018-01-31 MED ORDER — ALTEPLASE 2 MG IJ SOLR
4.0000 mg | Freq: Once | INTRAMUSCULAR | Status: DC
  Administered 2018-01-31: 4 mg

## 2018-01-31 MED ORDER — HEPARIN BOLUS VIA INFUSION
2000.0000 [IU] | Freq: Once | INTRAVENOUS | Status: AC
  Administered 2018-01-31: 2000 [IU] via INTRAVENOUS
  Filled 2018-01-31: qty 2000

## 2018-01-31 MED ORDER — KCL IN DEXTROSE-NACL 10-5-0.45 MEQ/L-%-% IV SOLN
INTRAVENOUS | Status: DC
  Administered 2018-01-31: 19:00:00 via INTRAVENOUS
  Filled 2018-01-31 (×2): qty 1000

## 2018-01-31 MED ORDER — POTASSIUM CHLORIDE IN NACL 20-0.45 MEQ/L-% IV SOLN
INTRAVENOUS | Status: DC
  Administered 2018-01-31: 08:00:00 via INTRAVENOUS
  Filled 2018-01-31: qty 1000

## 2018-01-31 MED ORDER — DEXTROSE-NACL 5-0.2 % IV SOLN
INTRAVENOUS | Status: DC
  Filled 2018-01-31: qty 1000

## 2018-01-31 MED ORDER — SODIUM CHLORIDE 0.9 % IV BOLUS
1000.0000 mL | Freq: Once | INTRAVENOUS | Status: AC
  Administered 2018-01-31: 1000 mL via INTRAVENOUS

## 2018-01-31 MED ORDER — SODIUM CHLORIDE 0.9 % IV SOLN
3.0000 g | Freq: Four times a day (QID) | INTRAVENOUS | Status: DC
  Administered 2018-01-31: 3 g via INTRAVENOUS
  Filled 2018-01-31: qty 3

## 2018-01-31 MED ORDER — IOPAMIDOL (ISOVUE-370) INJECTION 76%
100.0000 mL | Freq: Once | INTRAVENOUS | Status: AC | PRN
  Administered 2018-01-31: 80 mL via INTRAVENOUS

## 2018-01-31 MED ORDER — DILTIAZEM LOAD VIA INFUSION
15.0000 mg | Freq: Once | INTRAVENOUS | Status: AC
  Administered 2018-01-31: 15 mg via INTRAVENOUS
  Filled 2018-01-31: qty 15

## 2018-01-31 MED ORDER — HEPARIN (PORCINE) IN NACL 100-0.45 UNIT/ML-% IJ SOLN
1450.0000 [IU]/h | INTRAMUSCULAR | Status: DC
  Administered 2018-01-31 – 2018-02-01 (×2): 1000 [IU]/h via INTRAVENOUS
  Administered 2018-02-02: 1100 [IU]/h via INTRAVENOUS
  Administered 2018-02-04: 1400 [IU]/h via INTRAVENOUS
  Administered 2018-02-05: 1450 [IU]/h via INTRAVENOUS
  Administered 2018-02-05: 1400 [IU]/h via INTRAVENOUS
  Administered 2018-02-06 (×2): 1450 [IU]/h via INTRAVENOUS
  Filled 2018-01-31 (×8): qty 250

## 2018-01-31 SURGICAL SUPPLY — 79 items
BAG BANDED W/RUBBER/TAPE 36X54 (MISCELLANEOUS) ×4 IMPLANT
BAG SNAP BAND KOVER 36X36 (MISCELLANEOUS) ×4 IMPLANT
BLADE SURG 11 STRL SS (BLADE) ×4 IMPLANT
BRUSH SCRUB EZ PLAIN DRY (MISCELLANEOUS) ×8 IMPLANT
CANISTER SUCT 3000ML PPV (MISCELLANEOUS) ×4 IMPLANT
CATH ANGIO 5F BER2 65CM (CATHETERS) ×4 IMPLANT
CATH INDIGO 6 ST-TIP 135CM (CATHETERS) ×4 IMPLANT
CATH OMNI FLUSH .035X70CM (CATHETERS) ×4 IMPLANT
CATH QUICKCROSS SUPP .035X90CM (MICROCATHETER) ×4 IMPLANT
CLIP VESOCCLUDE MED 24/CT (CLIP) IMPLANT
CLIP VESOCCLUDE SM WIDE 24/CT (CLIP) IMPLANT
COVER DOME SNAP 22 D (MISCELLANEOUS) ×4 IMPLANT
COVER MAYO STAND STRL (DRAPES) IMPLANT
COVER PROBE W GEL 5X96 (DRAPES) ×4 IMPLANT
COVER TABLE BACK 60X90 (DRAPES) ×4 IMPLANT
DECANTER SPIKE VIAL GLASS SM (MISCELLANEOUS) ×4 IMPLANT
DERMABOND ADVANCED (GAUZE/BANDAGES/DRESSINGS) ×2
DERMABOND ADVANCED .7 DNX12 (GAUZE/BANDAGES/DRESSINGS) ×2 IMPLANT
DEVICE CLOSURE PERCLS PRGLD 6F (VASCULAR PRODUCTS) ×2 IMPLANT
DEVICE TORQUE H2O (MISCELLANEOUS) ×4 IMPLANT
ELECT BLADE 4.0 EZ CLEAN MEGAD (MISCELLANEOUS)
ELECT BLADE 6.5 EXT (BLADE) IMPLANT
ELECT REM PT RETURN 9FT ADLT (ELECTROSURGICAL)
ELECTRODE BLDE 4.0 EZ CLN MEGD (MISCELLANEOUS) IMPLANT
ELECTRODE REM PT RTRN 9FT ADLT (ELECTROSURGICAL) IMPLANT
GAUZE SPONGE 4X4 16PLY XRAY LF (GAUZE/BANDAGES/DRESSINGS) ×4 IMPLANT
GLOVE BIO SURGEON STRL SZ7 (GLOVE) ×4 IMPLANT
GLOVE BIOGEL PI IND STRL 7.0 (GLOVE) ×2 IMPLANT
GLOVE BIOGEL PI IND STRL 7.5 (GLOVE) ×6 IMPLANT
GLOVE BIOGEL PI INDICATOR 7.0 (GLOVE) ×2
GLOVE BIOGEL PI INDICATOR 7.5 (GLOVE) ×6
GLOVE SURG SS PI 7.5 STRL IVOR (GLOVE) ×4 IMPLANT
GOWN STRL REUS W/ TWL LRG LVL3 (GOWN DISPOSABLE) ×4 IMPLANT
GOWN STRL REUS W/ TWL XL LVL3 (GOWN DISPOSABLE) ×2 IMPLANT
GOWN STRL REUS W/TWL LRG LVL3 (GOWN DISPOSABLE) ×4
GOWN STRL REUS W/TWL XL LVL3 (GOWN DISPOSABLE) ×2
GUIDEWIRE ANGLED .035X260CM (WIRE) ×4 IMPLANT
HEMOSTAT SNOW SURGICEL 2X4 (HEMOSTASIS) IMPLANT
INSERT FOGARTY 61MM (MISCELLANEOUS) IMPLANT
INSERT FOGARTY SM (MISCELLANEOUS) IMPLANT
KIT BASIN OR (CUSTOM PROCEDURE TRAY) ×4 IMPLANT
KIT TURNOVER KIT B (KITS) ×4 IMPLANT
NEEDLE HYPO 25GX1X1/2 BEV (NEEDLE) ×4 IMPLANT
NEEDLE PERC 18GX7CM (NEEDLE) ×4 IMPLANT
NS IRRIG 1000ML POUR BTL (IV SOLUTION) IMPLANT
PACK AORTA (CUSTOM PROCEDURE TRAY) IMPLANT
PAD ARMBOARD 7.5X6 YLW CONV (MISCELLANEOUS) ×8 IMPLANT
PERCLOSE PROGLIDE 6F (VASCULAR PRODUCTS) ×4
SCRUB BETADINE 4OZ XXX (MISCELLANEOUS) ×4 IMPLANT
SET MICROPUNCTURE 5F STIFF (MISCELLANEOUS) ×4 IMPLANT
SHEATH AVANTI 11CM 5FR (SHEATH) ×4 IMPLANT
SHEATH GUIDING 7F 55X73X9MM TD (SHEATH) ×4 IMPLANT
SHEATH PINNACLE MP 7F 45CM (SHEATH) ×4 IMPLANT
SOLUTION BETADINE 4OZ (MISCELLANEOUS) ×4 IMPLANT
STOPCOCK MORSE 400PSI 3WAY (MISCELLANEOUS) ×4 IMPLANT
SUT ETHIBOND 5 LR DA (SUTURE) IMPLANT
SUT PDS AB 1 TP1 54 (SUTURE) IMPLANT
SUT PROLENE 3 0 SH 48 (SUTURE) IMPLANT
SUT PROLENE 5 0 C 1 24 (SUTURE) IMPLANT
SUT PROLENE 5 0 C 1 36 (SUTURE) IMPLANT
SUT SILK 2 0SH CR/8 30 (SUTURE) IMPLANT
SUT VIC AB 2-0 CT1 27 (SUTURE)
SUT VIC AB 2-0 CT1 TAPERPNT 27 (SUTURE) IMPLANT
SUT VIC AB 3-0 SH 27 (SUTURE)
SUT VIC AB 3-0 SH 27X BRD (SUTURE) IMPLANT
SUT VICRYL 4-0 PS2 18IN ABS (SUTURE) IMPLANT
SYR 10ML LL (SYRINGE) ×12 IMPLANT
SYR 20CC LL (SYRINGE) ×12 IMPLANT
SYR CONTROL 10ML LL (SYRINGE) ×4 IMPLANT
SYR MEDRAD MARK V 150ML (SYRINGE) ×4 IMPLANT
TOWEL BLUE STERILE X RAY DET (MISCELLANEOUS) IMPLANT
TOWEL GREEN STERILE (TOWEL DISPOSABLE) ×4 IMPLANT
TRAY FOLEY MTR SLVR 16FR STAT (SET/KITS/TRAYS/PACK) IMPLANT
TUBING ASPIRATION INDIGO (MISCELLANEOUS) ×4 IMPLANT
TUBING HIGH PRESSURE 120CM (CONNECTOR) ×4 IMPLANT
WATER STERILE IRR 1000ML POUR (IV SOLUTION) IMPLANT
WIRE BENTSON .035X145CM (WIRE) ×4 IMPLANT
WIRE HI TORQ VERSACORE J 260CM (WIRE) ×4 IMPLANT
WIRE ROSEN-J .035X260CM (WIRE) ×4 IMPLANT

## 2018-01-31 NOTE — Progress Notes (Signed)
[facility] for Heparin Indication:Pulmonary embolus & Mesenteric Artery thrombosis  Allergies  Allergen Reactions  . Pollen Extract     Sneezing, watery eyes   Patient Measurements: Height: 5\' 5"  (165.1 cm) Weight: 140 lb 3.4 oz (63.6 kg) IBW/kg (Calculated) : 57 Heparin Dosing Weight: 63.6 kg  Vital Signs: Temp: 98.5 F (36.9 C) (06/12 0800) Temp Source: Axillary (06/12 0800) BP: 147/100 (06/12 1600) Pulse Rate: 96 (06/12 1600)  Labs: Recent Labs    01/30/18 1635 01/31/18 1205 01/31/18 1418  HGB 15.9* 15.7*  --   HCT 48.8* 48.5*  --   PLT 267 251  --   CREATININE 1.18* 1.10*  --   TROPONINI  --   --  0.21*   Estimated Creatinine Clearance: 32.4 mL/min (A) (by C-G formula based on SCr of 1.1 mg/dL (H)).  Medical History: Past Medical History:  Diagnosis Date  . Alzheimer's disease   . Atrial fibrillation ([place])   . Bronchitis   . CHF (congestive heart failure) ([place])   . Chronic renal insufficiency, stage III (moderate) (HCC)   . Diabetes mellitus without complication ([place])   . Dyspnea    with exertion  . GERD (gastroesophageal reflux disease)   . GI bleeding 11/2013   due to supratherapeutic INR  . Hyperlipidemia   . Hypertension   . Hyperthyroidism    on amiodarone  . LBBB (left bundle branch block)   . Overactive bladder   . Overweight   . Persistent atrial fibrillation ([place])   . Pneumonia   . Thyroid mass    LLL  . UTI (urinary tract infection)   . Vitamin B12 deficiency   . Vitamin D deficiency    Medications:  Scheduled:  . heparin  2,000 Units Intravenous Once  . insulin aspart  0-15 Units Subcutaneous Q4H  . insulin glargine  8 Units Subcutaneous Daily  . iopamidol      . iopamidol      . mouth rinse  15 mL Mouth Rinse BID  . metoprolol tartrate  25 mg Oral BID   Infusions:  . azithromycin    . dextrose 5 % and 0.2 % NaCl 75 mL/hr at 01/31/18 1608  . diltiazem (CARDIZEM) infusion 5 mg/hr (01/31/18  1605)  . heparin    . piperacillin-tazobactam (ZOSYN)  IV 3.375 g (01/31/18 1605)   Assessment: 89 yoF to ED with c/o lethargy. CXray with infiltrates, chest/abd CT positive for PE and mesenteric artery thrombosis PMH: CHF, Afib - not on chronic anticoag, Dementia  Today, 01/31/2018 H/H wnl, Plt 251, D-dimer elevated 4.11 Clearance ~ 36 ml/min  Goal of Therapy:  Heparin level 0.3-0.7 units/ml Monitor platelets by anticoagulation protocol: Yes   Plan:    Heparin 2000 unit bolus, infusion at 1000 units/hr  Check first Heparin level 8 hr after bolus (~ 0100)  Daily CBC, daily Heparin level once at steady state  Monitor for s/s bleed  Minda Ditto PharmD Pager [phone] 01/31/2018, 6:40 PM

## 2018-01-31 NOTE — Consult Note (Signed)
Cardiology Consult    Patient ID: Anna [name] MRN: [id number], DOB/AGE: Nov 28, 1928   Admit date: 01/30/2018 Date of Consult: 01/31/2018  Primary Physician: Dorothyann Peng, NP Primary Cardiologist: Chip Boer Provider: Nevada Crane  Patient Profile    Anna [name] is a 84 y.o. female with a history of CHF, HTN, atrial fib , who is being seen today for the evaluation of  atiral fib  at the request of Dr. Nevada Crane And daughter    Past Medical History   Past Medical History:  Diagnosis Date  . Alzheimer's disease   . Atrial fibrillation ([place])   . Bronchitis   . CHF (congestive heart failure) ([place])   . Chronic renal insufficiency, stage III (moderate) (HCC)   . Diabetes mellitus without complication ([place])   . Dyspnea    with exertion  . GERD (gastroesophageal reflux disease)   . GI bleeding 11/2013   due to supratherapeutic INR  . Hyperlipidemia   . Hypertension   . Hyperthyroidism    on amiodarone  . LBBB (left bundle branch block)   . Overactive bladder   . Overweight   . Persistent atrial fibrillation ([place])   . Pneumonia   . Thyroid mass    LLL  . UTI (urinary tract infection)   . Vitamin B12 deficiency   . Vitamin D deficiency     Past Surgical History:  Procedure Laterality Date  . ABDOMINAL HYSTERECTOMY    . ESOPHAGOGASTRODUODENOSCOPY (EGD) WITH PROPOFOL N/A 02/16/2017   Procedure: ESOPHAGOGASTRODUODENOSCOPY (EGD) WITH PROPOFOL;  Surgeon: Irene Shipper, MD;  Location: [facility];  Service: Endoscopy;  Laterality: N/A;  . IR FLUORO GUIDE CV MIDLINE PICC RIGHT  07/21/2017  . IR US GUIDE VASC ACCESS RIGHT  07/21/2017  . IRRIGATION AND DEBRIDEMENT ABSCESS N/A 07/11/2017   Procedure: IRRIGATION AND DEBRIDEMENT OF SACRAL ABSCESS;  Surgeon: Ralene Ok, MD;  Location: [facility];  Service: General;  Laterality: N/A;  SACRUM     Allergies  Allergies  Allergen Reactions  . Pollen Extract     Sneezing, watery eyes    History of Present Illness   Pt is an  84 yo wit history of systolic and diastolic CHF, persistent atrial fib, GI bleeding, dementia, HTN, DM and CKD.   She was last seen by Berneice Gandy in clinic in July 2018   Echo from Jan 2018 LVEF 35 to 40%    Pt was admitted to [facility] on 6/11 with lethargy   Clammy on arrival to ED   Per daughter she had decreased intake for one week  Complained of cough and choking for 1 wk   Picture of mther she was sitting over weekend  Past 2 days pt took a turn   On arrival lto EDactic acid 5.4  In atrial fib with RVR   Placed on cardiazem for rate cntrol   Daughter requested cardiology consult   Pt treated for infectin empirically with ABX   Given fluids   CT done showing pulmonary emboil and occlusion of the SMA  Pt now on IV diltiazem for rate control and heparin   Inpatient Medications    . insulin aspart  0-15 Units Subcutaneous Q4H  . insulin glargine  8 Units Subcutaneous Daily  . iopamidol      . iopamidol      . mouth rinse  15 mL Mouth Rinse BID  . metoprolol tartrate  25 mg Oral BID    Family History    Family History  Problem Relation Age of Onset  . Heart disease Father   . Diabetes Mother   . Thyroid disease Neg Hx    indicated that her mother is deceased. She indicated that her father is deceased. She indicated that her maternal grandmother is deceased. She indicated that her maternal grandfather is deceased. She indicated that her paternal grandmother is deceased. She indicated that her paternal grandfather is deceased. She indicated that the status of her neg hx is unknown.   Social History    Social History   Socioeconomic History  . Marital status: Divorced    Spouse name: Not on file  . Number of children: Not on file  . Years of education: Not on file  . Highest education level: Not on file  Occupational History  . Not on file  Social Needs  . Financial resource strain: Not very hard  . Food insecurity:    Worry: Never true    Inability: Never true  . Transportation  needs:    Medical: No    Non-medical: No  Tobacco Use  . Smoking status: Never Smoker  . Smokeless tobacco: Never Used  Substance and Sexual Activity  . Alcohol use: No    Alcohol/week: 0.0 oz  . Drug use: No  . Sexual activity: Not on file  Lifestyle  . Physical activity:    Days per week: 0 days    Minutes per session: 0 min  . Stress: Only a little  Relationships  . Social connections:    Talks on phone: Patient refused    Gets together: Patient refused    Attends religious service: Patient refused    Active member of club or organization: No    Attends meetings of clubs or organizations: Never    Relationship status: Patient refused  . Intimate partner violence:    Fear of current or ex partner: Patient refused    Emotionally abused: Patient refused    Physically abused: Patient refused    Forced sexual activity: Patient refused  Other Topics Concern  . Not on file  Social History Narrative   Pt recently moved to [place] with daughter from [place]:  No chills, fever, night sweats or weight changes.  Cardiovascular:  No chest pain, dyspnea on exertion, edema, orthopnea, palpitations, paroxysmal nocturnal dyspnea. Dermatological: No rash, lesions/masses Respiratory: No cough, dyspnea Urologic: No hematuria, dysuria Abdominal:   No nausea, vomiting, diarrhea, bright red blood per rectum, melena, or hematemesis Neurologic:  No visual changes, wkns, changes in mental status. All other systems reviewed and are otherwise negative except as noted above.  Physical Exam    Blood pressure (!) 147/100, pulse 96, temperature 98.5 F (36.9 C), temperature source Axillary, resp. rate (!) 30, height 5\' 5"  (1.651 m), weight 63.6 kg (140 lb 3.4 oz), SpO2 99 %.  General: Pt not awake   Winces during abdominal exam otherwise does not move  Neuro:Deferred  HEENT: Normal  Neck: Neck is full   Lungs:   Bilateral rhonchi   Heart: Irreg irreg    no  s3, s4, or murmurs. Abdomen: Decreased BS   Winces some to palpation of L abdomen  .  Extremities: No clubbing, cyanosis   1+   edema. DP/PT/Radials 2+ and equal bilaterally.  Labs    Troponin [facility] Test) Recent Labs    01/30/18 2026  TROPIPOC 0.02   Recent Labs    01/31/18 1418  TROPONINI  0.21*   Lab Results  Component Value Date   WBC 26.2 (H) 01/31/2018   HGB 15.7 (H) 01/31/2018   HCT 48.5 (H) 01/31/2018   MCV 94.4 01/31/2018   PLT 251 01/31/2018    Recent Labs  Lab 01/30/18 1649 01/31/18 1205  NA  --  149*  K  --  3.8  CL  --  116*  CO2  --  18*  BUN  --  34*  CREATININE  --  1.10*  CALCIUM  --  9.7  PROT 7.3  --   BILITOT 0.8  --   ALKPHOS 82  --   ALT 13*  --   AST 25  --   GLUCOSE  --  361*   Lab Results  Component Value Date   CHOL 134 05/26/2017   HDL 43 05/26/2017   LDLCALC 72 05/26/2017   TRIG 96 05/26/2017   Lab Results  Component Value Date   DDIMER 4.11 (H) 01/31/2018     Radiology Studies    Ct Head Wo Contrast  Result Date: 01/30/2018 CLINICAL DATA:  Altered mental status, altered level of consciousness today, less responsive, history Alzheimer's, atrial fibrillation, bronchitis, CHF, stage III chronic renal disease, hypertension EXAM: CT HEAD WITHOUT CONTRAST TECHNIQUE: Contiguous axial images were obtained from the base of the skull through the vertex without intravenous contrast. Sagittal and coronal MPR images reconstructed from axial data set. COMPARISON:  12/30/2016 FINDINGS: Brain: Generalized atrophy. Normal ventricular morphology. No midline shift or mass effect. Minimal vessel chronic ischemic changes of deep cerebral white matter. No intracranial hemorrhage, mass lesion, evidence of acute infarction, or extra-axial fluid collection. Vascular: No hyperdense vessels. Minimal atherosclerotic calcification of internal carotid arteries at skull base. Skull: Intact Sinuses/Orbits: Clear Other: N/A IMPRESSION: Atrophy with  minimal small vessel chronic ischemic changes of deep cerebral white matter. No acute intracranial abnormalities. Electronically Signed   By: Lavonia Dana M.D.   On: 01/30/2018 19:08   Ct Angio Chest Pe W Or Wo Contrast  Result Date: 01/31/2018 CLINICAL DATA:  Inpatient. Severe sepsis. Fever. Abdominal pain. Leukocytosis. Clinical concern for intra-abdominal abscess. EXAM: CT ANGIOGRAPHY CHEST CT ABDOMEN AND PELVIS WITH CONTRAST TECHNIQUE: Multidetector CT imaging of the chest was performed using the standard protocol during bolus administration of intravenous contrast. Multiplanar CT image reconstructions and MIPs were obtained to evaluate the vascular anatomy. Multidetector CT imaging of the abdomen and pelvis was performed using the standard protocol during bolus administration of intravenous contrast. CONTRAST:  60mL ISOVUE-370 IOPAMIDOL (ISOVUE-370) INJECTION 76% COMPARISON:  12/30/2016 chest CT angiogram. 07/10/2017 pelvis CT. 01/30/2018 chest radiograph. FINDINGS: CTA CHEST FINDINGS Cardiovascular: The study is moderate quality for the evaluation of pulmonary embolism, with some motion degradation limiting evaluation of the segmental and subsegmental vessels. No central or lobar pulmonary emboli. There are segmental and subsegmental pulmonary emboli in the left upper lobe (series 7/image 78), which appear acute/subacute. There is a thin web like filling defect within a segmental right upper lobe pulmonary artery branch (series 7/image 92). No additional pulmonary artery branch filling defects. Top-normal caliber main pulmonary artery (3.0 cm diameter). Mild-to-moderate cardiomegaly. No significant pericardial fluid/thickening. Left anterior descending and right coronary atherosclerosis. Mediastinum/Nodes: Stable goiter asymmetric to the left with heterogeneous thyroid parenchyma. Unremarkable esophagus. No axillary adenopathy. Mild right paratracheal adenopathy measuring up to the 1.1 cm (series 4/image  32), stable since 12/30/2016, most compatible with benign reactive adenopathy. No additional pathologically enlarged mediastinal nodes. No hilar adenopathy. Lungs/Pleura: No pneumothorax. Trace dependent bilateral  pleural effusions. Nonspecific soft tissue layering in bronchus intermedius and central right lower lobe airways. Subpleural 3 mm solid anterior left upper lobe pulmonary nodule (series 6/image 27), stable since 12/30/2016 CT, considered benign. Mild patchy consolidation, ground-glass opacity and volume loss throughout the bilateral dependent lungs. No new significant pulmonary nodules in the aerated portions of the lungs. Musculoskeletal: No aggressive appearing focal osseous lesions. Moderate thoracic spondylosis. Review of the MIP images confirms the above findings. CT ABDOMEN and PELVIS FINDINGS Motion degraded scan, limiting assessment. Hepatobiliary: Normal liver with no liver mass. Normal gallbladder with no radiopaque cholelithiasis. No biliary ductal dilatation. Pancreas: Normal, with no mass or duct dilation. Spleen: Normal size. No mass. Adrenals/Urinary Tract: Mild thickening of the adrenal glands bilaterally without discrete adrenal nodules. No hydronephrosis. Subcentimeter hypodense renal cortical lesions in both kidneys are too small to characterize and require no follow-up. Normal bladder. Stomach/Bowel: Normal non-distended stomach. Normal caliber small bowel. Diffuse haziness of the small bowel wall without definite small bowel wall thickening. No pneumatosis. Appendix not discretely visualized. No pericecal inflammatory changes. Prominent rectal stool distending the rectum up to 8.6 cm diameter with the suggestion of mild rectal wall thickening, without significant acute perirectal fat stranding or definite pneumatosis. Otherwise no colonic wall thickening. Vascular/Lymphatic: Atherosclerotic nonaneurysmal abdominal aorta. Patent portal, splenic, hepatic and renal veins. There is acute  thrombosis of the superior mesenteric artery (series 11/image 34), which appears near occlusive. No pathologically enlarged lymph nodes in the abdomen or pelvis. Reproductive: Grossly normal uterus.  No adnexal mass. Other: No pneumoperitoneum, ascites or focal fluid collection. Musculoskeletal: No aggressive appearing focal osseous lesions. Marked lumbar spondylosis. Review of the MIP images confirms the above findings. IMPRESSION: 1. 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 2. Diffuse haziness of the wall of the small bowel without definite small bowel wall thickening or pneumatosis. Early bowel ischemia not excluded. No free air or abscess. 3. Acute/subacute pulmonary embolism in segmental/subsegmental left upper lobe pulmonary artery branches. Evidence of chronic pulmonary embolism in the right upper lobe. No central pulmonary embolism. Top-normal caliber main pulmonary artery. 4. Mild-to-moderate cardiomegaly. Trace dependent bilateral pleural effusions. 5. Dependent opacity with some volume loss throughout both lungs, favor atelectasis, difficult to exclude a component of aspiration or pneumonia. 6. Prominent rectal stool with mild rectal wall thickening, cannot exclude rectal fecal impaction or early stercoral colitis. 7. Aortic Atherosclerosis (ICD10-I70.0). Critical Value/emergent results were called by telephone at the time of interpretation on 01/31/2018 at 4:20 pm to Dr. Irene Pap , who verbally acknowledged these results. Electronically Signed   By: Ilona Sorrel M.D.   On: 01/31/2018 16:27   Ct Abdomen Pelvis W Contrast  Result Date: 01/31/2018 CLINICAL DATA:  Inpatient. Severe sepsis. Fever. Abdominal pain. Leukocytosis. Clinical concern for intra-abdominal abscess. EXAM: CT ANGIOGRAPHY CHEST CT ABDOMEN AND PELVIS WITH CONTRAST TECHNIQUE: Multidetector CT imaging of the chest was performed using the standard protocol  during bolus administration of intravenous contrast. Multiplanar CT image reconstructions and MIPs were obtained to evaluate the vascular anatomy. Multidetector CT imaging of the abdomen and pelvis was performed using the standard protocol during bolus administration of intravenous contrast. CONTRAST:  47mL ISOVUE-370 IOPAMIDOL (ISOVUE-370) INJECTION 76% COMPARISON:  12/30/2016 chest CT angiogram. 07/10/2017 pelvis CT. 01/30/2018 chest radiograph. FINDINGS: CTA CHEST FINDINGS Cardiovascular: The study is moderate quality for the evaluation of pulmonary embolism, with some motion degradation limiting evaluation of the segmental and subsegmental vessels. No central or  lobar pulmonary emboli. There are segmental and subsegmental pulmonary emboli in the left upper lobe (series 7/image 78), which appear acute/subacute. There is a thin web like filling defect within a segmental right upper lobe pulmonary artery branch (series 7/image 92). No additional pulmonary artery branch filling defects. Top-normal caliber main pulmonary artery (3.0 cm diameter). Mild-to-moderate cardiomegaly. No significant pericardial fluid/thickening. Left anterior descending and right coronary atherosclerosis. Mediastinum/Nodes: Stable goiter asymmetric to the left with heterogeneous thyroid parenchyma. Unremarkable esophagus. No axillary adenopathy. Mild right paratracheal adenopathy measuring up to the 1.1 cm (series 4/image 32), stable since 12/30/2016, most compatible with benign reactive adenopathy. No additional pathologically enlarged mediastinal nodes. No hilar adenopathy. Lungs/Pleura: No pneumothorax. Trace dependent bilateral pleural effusions. Nonspecific soft tissue layering in bronchus intermedius and central right lower lobe airways. Subpleural 3 mm solid anterior left upper lobe pulmonary nodule (series 6/image 27), stable since 12/30/2016 CT, considered benign. Mild patchy consolidation, ground-glass opacity and volume loss  throughout the bilateral dependent lungs. No new significant pulmonary nodules in the aerated portions of the lungs. Musculoskeletal: No aggressive appearing focal osseous lesions. Moderate thoracic spondylosis. Review of the MIP images confirms the above findings. CT ABDOMEN and PELVIS FINDINGS Motion degraded scan, limiting assessment. Hepatobiliary: Normal liver with no liver mass. Normal gallbladder with no radiopaque cholelithiasis. No biliary ductal dilatation. Pancreas: Normal, with no mass or duct dilation. Spleen: Normal size. No mass. Adrenals/Urinary Tract: Mild thickening of the adrenal glands bilaterally without discrete adrenal nodules. No hydronephrosis. Subcentimeter hypodense renal cortical lesions in both kidneys are too small to characterize and require no follow-up. Normal bladder. Stomach/Bowel: Normal non-distended stomach. Normal caliber small bowel. Diffuse haziness of the small bowel wall without definite small bowel wall thickening. No pneumatosis. Appendix not discretely visualized. No pericecal inflammatory changes. Prominent rectal stool distending the rectum up to 8.6 cm diameter with the suggestion of mild rectal wall thickening, without significant acute perirectal fat stranding or definite pneumatosis. Otherwise no colonic wall thickening. Vascular/Lymphatic: Atherosclerotic nonaneurysmal abdominal aorta. Patent portal, splenic, hepatic and renal veins. 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 No pathologically enlarged lymph nodes in the abdomen or pelvis. Reproductive: Grossly normal uterus.  No adnexal mass. Other: No pneumoperitoneum, ascites or focal fluid collection. Musculoskeletal: No aggressive appearing focal osseous lesions. Marked lumbar spondylosis. Review of the MIP images confirms the above findings. IMPRESSION: 1. 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 2. Diffuse haziness of the wall of the small bowel without definite small bowel wall thickening or pneumatosis. Early bowel ischemia not excluded. No free air or abscess. 3. Acute/subacute pulmonary embolism in segmental/subsegmental left upper lobe pulmonary artery branches. Evidence of chronic pulmonary embolism in the right upper lobe. No central pulmonary embolism. Top-normal caliber main pulmonary artery. 4. Mild-to-moderate cardiomegaly. Trace dependent bilateral pleural effusions. 5. Dependent opacity with some volume loss throughout both lungs, favor atelectasis, difficult to exclude a component of aspiration or pneumonia. 6. Prominent rectal stool with mild rectal wall thickening, cannot exclude rectal fecal impaction or early stercoral colitis. 7. Aortic Atherosclerosis (ICD10-I70.0). Critical Value/emergent results were called by telephone at the time of interpretation on 01/31/2018 at 4:20 pm to Dr. Irene Pap , who verbally acknowledged these results. Electronically Signed   By: Ilona Sorrel M.D.   On: 01/31/2018 16:27   Dg Chest Port 1 View  Result Date: 01/30/2018 CLINICAL DATA:  Diaphoresis, lethargy, history  Alzheimer's, atrial fibrillation, CHF, stage III chronic renal insufficiency, diabetes mellitus, hypertension, recent surgery for coccygeal abscess EXAM: PORTABLE CHEST 1 VIEW COMPARISON:  Portable exam 1844 hours compared to 07/27/2017 FINDINGS: Enlargement of cardiac silhouette with pulmonary vascular congestion. Atherosclerotic calcification aorta. Patchy opacity at LEFT base could represent infiltrate or atelectasis in LEFT lower lobe. Remaining lungs grossly clear. Skin fold projects over LEFT upper chest. No pneumothorax or gross pleural effusion. Bones demineralized. IMPRESSION: Mild patchy LEFT basilar opacity question mild atelectasis or infiltrate in LEFT lower lobe. Electronically Signed   By: Lavonia Dana M.D.   On: 01/30/2018 18:59    ECG &  Cardiac Imaging    EKG:  Atrial fib  93 bpm  LBBB  Tele:   Afib  90s to 100    Echo pending   Assessment & Plan  84 yo with known CHF (LVEF 35 to 40% by echo in Jan 2018), atrial fib admitte with lethargy   Found to have pulmonary emboli and occlusioin of SMA  1  Atrial fibrillation   Rates are under relatively good control on current regimen   Would continue  2  Chronic systolic CHF   Volume status appears up some but not bad  Oxygenating OK  Will continue to follow     3  Pulmonary emboli    CT as above  Currently on heparin   BP is high now sugg that tolerating hemodymaically   Continue anticoagulation  4  SMA thrombosis.  This is the biggest problem pt is facing   W Trula Slade has seen pt and is discussing treatiment options with family   Pt is acidotic  Will continue to follow in periprocedural period Patient's prognosis is guarded   Discussed this with family.    Signed, Dorris Carnes, MD 01/31/2018, 5:16 PM  For questions or updates, please contact   Please consult [email or web address] for contact info under Cardiology/STEMI.

## 2018-01-31 NOTE — Progress Notes (Signed)
Inpatient Diabetes Program Recommendations  AACE/ADA: New Consensus Statement on Inpatient Glycemic Control (2015)  Target Ranges:  Prepandial:   less than 140 mg/dL      Peak postprandial:   less than 180 mg/dL (1-2 hours)      Critically ill patients:  140 - 180 mg/dL   Results for Anna [name], Anna [name] (MRN [id number]) as of 01/31/2018 14:13  Ref. Range 01/30/2018 16:56 01/31/2018 08:40 01/31/2018 12:52  Glucose-Capillary Latest Ref Range: 65 - 99 mg/dL 171 (H) 364 (H) 337 (H)   Review of Glycemic Control  Diabetes history: DM 2 Outpatient Diabetes medications: Was on Amaryl in the past Current orders for Inpatient glycemic control: Novolog Sensitive Correction 0-9 units tid  Inpatient Diabetes Program Recommendations:    Glucose trends in the 300's. Consider Lantus 8 units.   Thanks,  Tama Headings RN, MSN, BC-ADM, [facility] Inpatient Diabetes Coordinator Team Pager [phone] (8a-5p)

## 2018-01-31 NOTE — Consult Note (Signed)
[facility] [place]  Patient name: Anna [name] MRN: [id number] DOB: Mar 19, 1929 Sex: female   REQUESTING PROVIDER:    Dr. Nevada Crane   REASON FOR CONSULT:    SMA thrombus  HISTORY OF PRESENT ILLNESS:   Anna [name] is a 84 y.o. female, who was admitted yesterday with a chief complaint of lethargy.  The patient suffers from significant dementia and all history is provided by her daughter who is the healthcare power of attorney.  The patient does live with the daughter at home.  Reportedly the patient had reduced her fluid intake over the past week because of her history of heart failure, and the daughter felt that she was filling up with fluid in her lungs.  She was giving extra doses of Lasix.  In the ER she was diaphoretic and clammy.  The patient suffers from advanced Alzheimer's dementia.  She has recently been treated surgically for a coccygeal abscess.  She is currently using a wound VAC.  She was found to have a positive urinary tract infection in the emergency department.  She is admitted for IV heparinization.  She also had an elevated lactate.  Today her white blood cell count increased significantly and CT scan imaging was performed.  This showed a small burden acute versus subacute PE.  There was also what appeared to be a  acute superior mesenteric artery thrombus.  There is mild bowel wall edema but no obvious signs of ischemia.  PAST MEDICAL HISTORY    Past Medical History:  Diagnosis Date  . Alzheimer's disease   . Atrial fibrillation ([place])   . Bronchitis   . CHF (congestive heart failure) ([place])   . Chronic renal insufficiency, stage III (moderate) (H[facility])   . Diabetes mellitus without complication ([place])   . Dyspnea    with exertion  . GERD (gastroesophageal reflux disease)   . GI bleeding 11/2013   due to supratherapeutic INR  . Hyperlipidemia   . Hypertension   . Hyperthyroidism    on amiodarone  . LBBB  (left bundle branch block)   . Overactive bladder   . Overweight   . Persistent atrial fibrillation ([place])   . Pneumonia   . Thyroid mass    LLL  . UTI (urinary tract infection)   . Vitamin B12 deficiency   . Vitamin D deficiency      FAMILY HISTORY   Family History  Problem Relation Age of Onset  . Heart disease Father   . Diabetes Mother   . Thyroid disease Neg Hx     SOCIAL HISTORY:   Social History   Socioeconomic History  . Marital status: Divorced    Spouse name: Not on file  . Number of children: Not on file  . Years of education: Not on file  . Highest education level: Not on file  Occupational History  . Not on file  Social Needs  . Financial resource strain: Not very hard  . Food insecurity:    Worry: Never true    Inability: Never true  . Transportation needs:    Medical: No    Non-medical: No  Tobacco Use  . Smoking status: Never Smoker  . Smokeless tobacco: Never Used  Substance and Sexual Activity  . Alcohol use: No    Alcohol/week: 0.0 oz  . Drug use: No  . Sexual activity: Not on file  Lifestyle  . Physical activity:    Days per week: 0 days  Minutes per session: 0 min  . Stress: Only a little  Relationships  . Social connections:    Talks on phone: Patient refused    Gets together: Patient refused    Attends religious service: Patient refused    Active member of club or organization: No    Attends meetings of clubs or organizations: Never    Relationship status: Patient refused  . Intimate partner violence:    Fear of current or ex partner: Patient refused    Emotionally abused: Patient refused    Physically abused: Patient refused    Forced sexual activity: Patient refused  Other Topics Concern  . Not on file  Social History Narrative   Pt recently moved to [place] with daughter from [place]:    Allergies  Allergen Reactions  . Pollen Extract     Sneezing, watery eyes    CURRENT MEDICATIONS:     Current Facility-Administered Medications  Medication Dose Route Frequency Provider Last Rate Last Dose  . acetaminophen (TYLENOL) suppository 650 mg  650 mg Rectal Q6H PRN Emokpae, Ejiroghene E, MD      . azithromycin (ZITHROMAX) 500 mg in sodium chloride 0.9 % 250 mL IVPB  500 mg Intravenous Q24H Emokpae, Ejiroghene E, MD      . dextrose 5 % and 0.45 % NaCl with KCl 10 mEq/L infusion   Intravenous Continuous Hall, Carole N, DO      . diltiazem (CARDIZEM) 100 mg in dextrose 5% 139mL (1 mg/mL) infusion  5-15 mg/hr Intravenous Titrated Emokpae, Ejiroghene E, MD 5 mL/hr at 01/31/18 1605 5 mg/hr at 01/31/18 1605  . heparin ADULT infusion 100 units/mL (25000 units/262mL sodium chloride 0.45%)  1,000 Units/hr Intravenous Continuous Green, [name] L, RPH 10 mL/hr at 01/31/18 1700 1,000 Units/hr at 01/31/18 1700  . insulin aspart (novoLOG) injection 0-15 Units  0-15 Units Subcutaneous Q4H Irene Pap N, DO   8 Units at 01/31/18 1658  . insulin glargine (LANTUS) injection 8 Units  8 Units Subcutaneous Daily Irene Pap N, DO   8 Units at 01/31/18 1605  . iopamidol (ISOVUE-300) 61 % injection           . iopamidol (ISOVUE-370) 76 % injection           . MEDLINE mouth rinse  15 mL Mouth Rinse BID Irene Pap N, DO   15 mL at 01/31/18 0904  . metoprolol tartrate (LOPRESSOR) injection 5 mg  5 mg Intravenous Q4H PRN Hall, Carole N, DO      . ondansetron (ZOFRAN) tablet 4 mg  4 mg Oral Q6H PRN Emokpae, Ejiroghene E, MD       Or  . ondansetron (ZOFRAN) injection 4 mg  4 mg Intravenous Q6H PRN Emokpae, Ejiroghene E, MD      . piperacillin-tazobactam (ZOSYN) IVPB 3.375 g  3.375 g Intravenous Q8H Emokpae, Ejiroghene E, MD 12.5 mL/hr at 01/31/18 1605 3.375 g at 01/31/18 1605    REVIEW OF SYSTEMS:   Unable to obtain given the patient's dementia and lethargy  PHYSICAL EXAM:   Vitals:   01/31/18 1600 01/31/18 1630 01/31/18 1700 01/31/18 1800  BP: (!) 147/100 (!) 161/96 (!) 159/85 (!) 168/71  Pulse: 96  71 100 (!) 47  Resp: (!) 30 (!) 23 (!) 22 (!) 27  Temp:  98.3 F (36.8 C)    TempSrc:  Axillary    SpO2: 99% 98% 99% 100%  Weight:      Height:  GENERAL:  patient is resting comfortably in bed.  She does not respond to verbal or physical stimuli.Marland Kitchen CARDIAC: Tachycardic with irregular rhythm and frequent PVCs VASCULAR: Pedal pulses are not palpable.  She has palpable femoral pulses PULMONARY:  tachypneic with respiratory rate of 30 ABDOMEN: Patient does not respond or rales to deep palpation.  No peritoneal signs unable to determine given the patient's lethargy MUSCULOSKELETAL: There are no major deformities or cyanosis. NEUROLOGIC: No focal weakness or paresthesias are detected. SKIN: Sacral decubitus with dressing intact PSYCHIATRIC: T unable to obtain given lethargy  STUDIES:   I have reviewed her CT scan with the following findings:  1. 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 2. Diffuse haziness of the wall of the small bowel without definite small bowel wall thickening or pneumatosis. Early bowel ischemia not excluded. No free air or abscess. 3. Acute/subacute pulmonary embolism in segmental/subsegmental left upper lobe pulmonary artery branches. Evidence of chronic pulmonary embolism in the right upper lobe. No central pulmonary embolism. Top-normal caliber main pulmonary artery. 4. Mild-to-moderate cardiomegaly. Trace dependent bilateral pleural effusions. 5. Dependent opacity with some volume loss throughout both lungs, favor atelectasis, difficult to exclude a component of aspiration or pneumonia. 6. Prominent rectal stool with mild rectal wall thickening, cannot exclude rectal fecal impaction or early stercoral colitis. 7. Aortic Atherosclerosis (ICD10-I70.0).  ASSESSMENT and PLAN   Superior mesenteric artery thrombus: I spent in excess of 90 minutes with the patient and her family  discussing her treatment options.  I discussed that there are 3 possibilities.  The first would be exploratory laparotomy and open thrombectomy of her superior mesenteric artery.  I discussed with the family that the patient is not a candidate for this operation.  The second option would be catheter directed, lysis with mechanical thrombectomy.  I discussed that this procedure to would carry risk of possible renal insufficiency reperfusion injury, intracranial bleeding, groin access complications.  The third option would be continuing current management with IV heparin and antibiotics and see if the patient can resolve this on her own.  The family wants to do everything possible.  I told them that there is a high probability of a poor outcome.   Annamarie Major, MD [facility] [phone] Pager [phone]

## 2018-01-31 NOTE — Anesthesia Procedure Notes (Signed)
Arterial Line Insertion Start/End6/07/2018 8:45 PM, 01/31/2018 8:50 PM Performed by: [name], MD, Maness, Kathrin Penner, CRNA, CRNA  Preanesthetic checklist: patient identified, IV checked, surgical consent, monitors and equipment checked, pre-op evaluation and timeout performed Right, radial was placed Catheter size: 20 G Maximum sterile barriers used   Attempts: 1 Procedure performed without using ultrasound guided technique. Ultrasound Notes:anatomy identified Following insertion, Biopatch and dressing applied.

## 2018-01-31 NOTE — Consult Note (Addendum)
[facility] Nurse wound consult note Reason for Consult: Sacral wound Wound type: Healing Pressure Injury Pressure Injury POA: Yes Measurement: 2.2 cm x 1.8 cm x 1 cm Wound bed: 100% pink Drainage (amount, consistency, odor) No odor, no drainage Periwound: Macerated wound edges that are rolled and contracted. Dressing procedure/placement/frequency: Twice daily saline moistened gauze and ABD pad.  I have added an air mattress and instructions for every 2 hour turning side to side. Monitor the wound area(s) for worsening of condition such as: Signs/symptoms of infection,  Increase in size,  Development of or worsening of odor, Development of pain, or increased pain at the affected locations.  Notify the medical team if any of these develop.  Thank you for the consult.  Discussed plan of care with the patient and bedside nurse.  [facility] nurse will not follow at this time.  Please re-consult the [facility] team if needed.  Val Riles, RN, MSN, CWOCN, CNS-BC, pager [phone]

## 2018-01-31 NOTE — Transfer of Care (Signed)
Immediate Anesthesia Transfer of Care Note  Patient: Anna [name]  Procedure(s) Performed: mechanical thrombectomy of superior mesenteric artery, ultrasound guided access right groin (N/A ) AORTOGRAM  Patient Location: ICU  Anesthesia Type:MAC  Level of Consciousness: unresponsive and lethargic  Airway & Oxygen Therapy: Patient Spontanous Breathing and Patient connected to nasal cannula oxygen  Post-op Assessment: Report given to RN and Post -op Vital signs reviewed and stable  Post vital signs: Reviewed and stable  Last Vitals:  Vitals Value Taken Time  BP    Temp    Pulse 100 01/31/2018 10:58 PM  Resp 31 01/31/2018 10:59 PM  SpO2 99 % 01/31/2018 10:59 PM  Vitals shown include unvalidated device data.  Last Pain:  Vitals:   01/31/18 1630  TempSrc: Axillary         Complications: No apparent anesthesia complications

## 2018-01-31 NOTE — Anesthesia Procedure Notes (Signed)
Procedure Name: MAC Date/Time: 01/31/2018 8:36 PM Performed by: Claris Che, CRNA Pre-anesthesia Checklist: Patient identified, Emergency Drugs available, Suction available, Patient being monitored and Timeout performed Patient Re-evaluated:Patient Re-evaluated prior to induction Oxygen Delivery Method: Simple face mask

## 2018-01-31 NOTE — Progress Notes (Signed)
CRITICAL VALUE ALERT  Critical Value:  Lactic acid 6.3  Date & Time Notied:  01/31/2018 1991 Provider Notified: Dr Nevada Crane text paged  Orders Received/Actions taken:call returned MD is coming to bedside to speak with daughter

## 2018-01-31 NOTE — Progress Notes (Signed)
PHARMACY NOTE:  ANTIMICROBIAL RENAL DOSAGE ADJUSTMENT  Current antimicrobial regimen includes a mismatch between antimicrobial dosage and estimated renal function.  As per policy approved by the Pharmacy & Therapeutics and Medical Executive Committees, the antimicrobial dosage will be adjusted accordingly.  Current antimicrobial dosage:  Zosyn 2.25 gm IV q6h  Indication: Aspiration PNA and UTI  Renal Function:  CrCl cannot be calculated (Unknown ideal weight.). []      On intermittent HD, scheduled: []      On CRRT    Antimicrobial dosage has been changed to:  Zosyn 3.375 gm IV q8h EI  Additional comments: Will f/u when wt and ht in Epic. CrCl~ 38 ml/min (N)  EI used if CrCl>20 ml/min   Thank you for allowing pharmacy to be a part of this patient's care.  Dorrene German, [facility] 01/31/2018 5:49 AM

## 2018-01-31 NOTE — Progress Notes (Signed)
SLP Cancellation Note  Patient Details Name: Anna [name] MRN: [id number] DOB: Jun 11, 1929   Cancelled treatment:       Reason Eval/Treat Not Completed: Medical issues which prohibited therapy - RN indicates pt is not appropriate for evaluation at this time. RN reports pt having difficulty swallowing prior to admit. ST has seen pt in the past (January 2018) and documented "Neuromuscular and language impairment". Mech soft (Dys 3) diet and thin liquids recommended at that time. ST will continue efforts to evaluate when pt able to participate.  Celia B. Quentin Ore [facility], CCC-SLP Speech Language Pathologist [phone]  Shonna Chock 01/31/2018, 9:50 AM

## 2018-01-31 NOTE — Op Note (Signed)
Patient name: Anna [name] MRN: [id number] DOB: Jul 07, 1929 Sex: female  01/31/2018 Pre-operative Diagnosis: Superior mesenteric artery thrombus Post-operative diagnosis:  Same Surgeon:  Annamarie Major Assistants:  none Procedure:   #1: Ultrasound-guided access, right femoral artery   #2: Abdominal aortogram   #3: First order catheterization, superior mesenteric artery   #4: Superior mesenteric artery angiogram   #5: Catheter directed administration of TPA into the superior mesenteric artery   #6: Mechanical thrombectomy, superior mesenteric artery   #7: Closure device (Pro-glide) Anesthesia:  MAC Blood Loss:  See anesthesia record Specimens:  none  Findings: The thrombus was well organized, suggesting a more chronic appearance.  Unsuccessful attempt at mechanical thrombectomy.  Indications: I was consulted on this patient earlier this evening.  She was admitted for lethargy.  She has an elevated lactate as well as white blood cell count.  CT scan revealed pulmonary emboli as well as thrombus within the superior mesenteric artery.  I had a lengthy discussion with the family regarding her options.  They understand that the prognosis is very poor.  We contemplated medical management versus an attempt at thrombolysis.  She is not a candidate for surgical intervention because of her underlying comorbidities.  The family elected to attempt thrombolysis.  Procedure:  The patient was identified in the holding area and taken to [place] 16  The patient was then placed supine on the table. MAC anesthesia was administered.  The patient was prepped and draped in the usual sterile fashion.  A time out was called and antibiotics were administered.  Ultrasound was used to evaluate the right common femoral artery which had some calcium present but was widely patent.  A digital ultrasound image was acquired.  A #11 blade was used to make a skin nick.  The right common femoral artery was then cannulated  under ultrasound guidance with a micropuncture needle.  An 018 wire was advanced without resistance followed by placement of micropuncture sheath.  A Bentson wire was then inserted into the aorta.  A 7 French 45 cm Terumo sheath was inserted.  An Omni Flush catheter was advanced to the level of L1 and the abdominal aortogram was performed in the AP and lateral projections.  The celiac and inferior mesenteric artery appear to be patent.  There was minimal opacification of the mesenteric branches.  I then selectively cannulated the superior mesenteric artery with a Omni Flush catheter.  I advanced a versa core wire down approximately 10 cm into the superior mesenteric artery where I met significant resistance.  I was unable to exchanged out a more stiff wire and so a quick cross catheter was advanced.  I could not get a Rosen wire to go through the quick cross catheter and therefore a versa core wire was reinserted.  I then switched out the Terumo sheath for a sheath and was able to direct the tip of the sheath into the superior mesenteric artery.  A contrast injection was then performed O[place]or which showed occlusion of the superior mesenteric artery.  I then administered 4 mg of intra-arterial TPA into the superior mesenteric artery.  I waited approximately 10 minutes and repeated the arteriogram which showed minimal improvement.  I then inserted a CAT 6 mechanical thrombectomy device and performed mechanical thrombectomy.  I did use the separator.  I was unable to get through the thrombus.  I made multiple attempts to navigate the separator and the CAT 6 through the thrombus but was unsuccessful.  I  then tried to navigate a Glidewire through the thrombus but again was unsuccessful.  At this point I felt that this process was more subacute or chronic rather than acute and felt like with persistent attempts that the patient would be at risk for perforation.  At this point I elected to terminate the procedure.  I closed  the groin with the pro-glide.  There were no immediate complications.  The patient was continued on a heparin drip throughout the procedure and I will continue this postoperatively.   Disposition: Patient will be returned to the ICU and guarded condition   V. Annamarie Major, M.D. [facility] [place] Office: [phone] Pager:  [phone]

## 2018-01-31 NOTE — Consult Note (Signed)
PULMONARY / CRITICAL CARE MEDICINE   Name: Anna [name] MRN: [id number] DOB: [birth year]    ADMISSION DATE:  01/30/2018 CONSULTATION DATE:  01/31/2018  REFERRING MD:  Dr. Trula Slade  CHIEF COMPLAINT:  Lethargic   HISTORY OF PRESENT ILLNESS:   84 year old female with PMH of Alzheimer's dementia, systolic and diastolic HF- EF 36%, DM, Afib (not on AC) who was admitted on 6/11   She presented after decreased PO intake, lethargy and episodes of choking and coughing.  Patient lives with her daughter and is mostly bed-bound.  She has been recently treated for a coccygeal abscess.  Admitted and treated for possible UTI and bilateral lower lobe infiltrates concerning for aspiration pneumonia is being treated with zosyn.  However on CTA PE was found to have multiple acute/ subacute Pes.  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  She was started on heparin gtt and vascular and cardiology consulting.  She was deemed not a surgical candidate.  Lengthy discussions held with family who wish for all measures.  She was transferred to [facility] and taken to OR by Dr. Trula Slade under local anesthesia for who attempted mechanical thrombectomy which was unsuccessful.  PMT consulted and will see in AM to continue further [place] discussions.  She returns to the ICU, hemodynamically stable on [place], for further treatment of medical issues, PCCM asked to assist in management.   PAST MEDICAL HISTORY :  She  has a past medical history of Alzheimer's disease, Atrial fibrillation ([place]), Bronchitis, CHF (congestive heart failure) ([address]), Chronic renal insufficiency, stage III (moderate) ([place]), Diabetes mellitus without complication ([place]), Dyspnea, GERD (gastroesophageal reflux disease), GI bleeding (11/2013), Hyperlipidemia, Hypertension, Hyperthyroidism, LBBB (left bundle branch block), Overactive bladder, Overweight, Persistent atrial fibrillation ([place]), Pneumonia, Thyroid mass, UTI (urinary tract  infection), Vitamin B12 deficiency, and Vitamin D deficiency.  PAST SURGICAL HISTORY: She  has a past surgical history that includes Abdominal hysterectomy; Esophagogastroduodenoscopy (egd) with propofol (N/A, 02/16/2017); Irrigation and debridement abscess (N/A, 07/11/2017); IR US Guide Vasc Access Right (07/21/2017); and IR Fluoro Guide CV Midline PICC Right (07/21/2017).  Allergies  Allergen Reactions  . Pollen Extract     Sneezing, watery eyes    No current facility-administered medications on file prior to encounter.    Current Outpatient Medications on File Prior to Encounter  Medication Sig  . aspirin 325 MG tablet Take 325 mg by mouth daily.  . calcitRIOL (ROCALTROL) 0.5 MCG capsule TAKE 1 CAPSULE ON MONDAY, WEDNESDAY, AND FRIDAY OF EACH WEEK.  . carbamide peroxide (DEBROX) 6.5 % OTIC solution Place 5 drops into both ears 2 (two) times daily. (Patient taking differently: Place 5 drops 2 (two) times daily as needed into both ears (irritation). )  . cholecalciferol (VITAMIN D) 1000 units tablet Take 1,000 Units by mouth daily.  Marland Kitchen donepezil (ARICEPT) 10 MG tablet TAKE 1 TABLET ONCE DAILY.  . furosemide (LASIX) 20 MG tablet TAKE 1&1/2 TABLETS DAILY. TAKE AN EXTRA 1/2 TABLET DAILY AS NEEDED.  Marland Kitchen glimepiride (AMARYL) 2 MG tablet TAKE 1 TABLET ONCE DAILY. (Patient taking differently: No sig reported)  . guaiFENesin (MUCINEX PO) Take 1 tablet by mouth daily as needed (chest congestion).  Marland Kitchen HYDROcodone-acetaminophen (NORCO) 5-325 MG tablet Take 0.5 tablets by mouth every 8 (eight) hours as needed for moderate pain.  Marland Kitchen loratadine (ALAVERT) 10 MG tablet Take 5 mg daily by mouth.   . memantine (NAMENDA) 5 MG tablet TAKE ONE TABLET AT BEDTIME.  . metoprolol tartrate (LOPRESSOR) 25 MG tablet  Take 1 tablet (25 mg total) by mouth 2 (two) times daily.  . Phenazopyridine HCl (AZO TABS PO) Take 1 tablet by mouth 2 (two) times daily.  Vladimir Faster Glycol-Propyl Glycol (SYSTANE) 0.4-0.3 % SOLN Apply 1  drop to eye 2 (two) times daily as needed (dry eyes).  . polyethylene glycol (MIRALAX / GLYCOLAX) packet Take 17 g by mouth daily as needed for mild constipation, moderate constipation or severe constipation.  . potassium chloride SA (K-DUR,KLOR-CON) 20 MEQ tablet TAKE 1 TABLET ONCE DAILY.  Marland Kitchen saccharomyces boulardii (FLORASTOR) 250 MG capsule Take 250 mg by mouth 2 (two) times daily.  . vitamin B-12 (CYANOCOBALAMIN) 500 MCG tablet Take 500 mcg by mouth daily.  Marland Kitchen ACCU-CHEK AVIVA PLUS test strip PATIENT TO CHECK SUGARS ONCE DAILY.  Marland Kitchen donepezil (ARICEPT) 10 MG tablet TAKE 1 TABLET ONCE DAILY. (Patient not taking: Reported on 01/30/2018)  . fluconazole (DIFLUCAN) 100 MG tablet Take 1 tablet (100 mg total) by mouth daily. (Patient not taking: Reported on 01/30/2018)  . Iron-FA-B Cmp-C-Biot-Probiotic (FUSION PLUS) CAPS Take 1 tablet by mouth daily. (Patient not taking: Reported on 01/30/2018)  . omeprazole (PRILOSEC) 40 MG capsule Take 30- 60 min before your first and last meals of the day (Patient not taking: Reported on 01/30/2018)    FAMILY HISTORY:  Her indicated that her mother is deceased. She indicated that her father is deceased. She indicated that her maternal grandmother is deceased. She indicated that her maternal grandfather is deceased. She indicated that her paternal grandmother is deceased. She indicated that her paternal grandfather is deceased. She indicated that the status of her neg hx is unknown.   SOCIAL HISTORY: She  reports that she has never smoked. She has never used smokeless tobacco. She reports that she does not drink alcohol or use drugs.  REVIEW OF SYSTEMS:   Unable to assess as patient due to acute encephalopathy.  SUBJECTIVE:    VITAL SIGNS: BP (!) 169/74   Pulse 100   Temp (!) 94.1 F (34.5 C) (Axillary)   Resp (!) 29   Ht 5\' 5"  (1.651 m)   Wt 138 lb 7.2 oz (62.8 kg)   SpO2 99%   BMI 23.04 kg/m   HEMODYNAMICS:    VENTILATOR SETTINGS:    INTAKE /  OUTPUT: I/O last 3 completed shifts: In: 4079 [I.V.:2755; IV Piggyback:1324] Out: 1025 [Urine:1025]  PHYSICAL EXAMINATION: General:  Elderly female obtunded in bed in NAD HEENT: MM pale/ dry, no JVD, pupils 4 reactive Neuro: obtunded, withdrawals to painful stimuli, no f/c CV: IRIR, barely palpable distal pulses, femoral strong PULM: even/non-labored, tachypneic, coarse BS IR:JJOA, hypoBS Extremities: cool/dry, +1 generalized edema  Skin: no rashes, sacral dressing  LABS:  BMET Recent Labs  Lab 01/30/18 1635 01/31/18 1205 01/31/18 1649 01/31/18 2205  NA 145 149* 143 149*  K 3.6 3.8 3.3* 2.8*  CL 107 116* 115*  --   CO2 26 18* 19*  --   BUN 34* 34* 33*  --   CREATININE 1.18* 1.10* 1.00  --   GLUCOSE 225* 361* 294* 197*    Electrolytes Recent Labs  Lab 01/30/18 1635 01/31/18 1205 01/31/18 1649  CALCIUM 10.3 9.7 9.1    CBC Recent Labs  Lab 01/30/18 1635 01/31/18 1205 01/31/18 2205  WBC 9.8 26.2*  --   HGB 15.9* 15.7* 14.6  HCT 48.8* 48.5* 43.0  PLT 267 251  --     Coag's No results for input(s): APTT, INR in the last 168 hours.  Sepsis Markers Recent Labs  Lab 01/31/18 0656 01/31/18 1435 01/31/18 1649  LATICACIDVEN 6.3* 3.2* 3.2*  PROCALCITON 1.34  --   --     ABG No results for input(s): PHART, PCO2ART, PO2ART in the last 168 hours.  Liver Enzymes Recent Labs  Lab 01/30/18 1649  AST 25  ALT 13*  ALKPHOS 82  BILITOT 0.8  ALBUMIN 4.0    Cardiac Enzymes Recent Labs  Lab 01/31/18 1418  TROPONINI 0.21*    Glucose Recent Labs  Lab 01/30/18 1656 01/31/18 0840 01/31/18 1252 01/31/18 1618  GLUCAP 171* 364* 337* 256*    Imaging  STUDIES:  6/12 CTH >> Atrophy with minimal small vessel chronic ischemic changes of deep cerebral white matter. No acute intracranial abnormalities  6/12 CT abd/pelvis/ CTA PE >> 1. 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 2. Diffuse haziness of the wall of the small bowel without definite small bowel wall thickening or pneumatosis. Early bowel ischemia not excluded. No free air or abscess. 3. Acute/subacute pulmonary embolism in segmental/subsegmental left upper lobe pulmonary artery branches. Evidence of chronic pulmonary embolism in the right upper lobe. No central pulmonary embolism. Top-normal caliber main pulmonary artery. 4. Mild-to-moderate cardiomegaly. Trace dependent bilateral pleural effusions. 5. Dependent opacity with some volume loss throughout both lungs, favor atelectasis, difficult to exclude a component of aspiration or pneumonia. 6. Prominent rectal stool with mild rectal wall thickening, cannot exclude rectal fecal impaction or early stercoral colitis. 7. Aortic Atherosclerosis  CULTURES: 6/11 UC >> 6/12 BC >> 6/12 MRSA PCR >> neg  ANTIBIOTICS: 6/11 unasyn x 1 6/11 azithromycin x 1 6/11 ceftriaxone x 1 6/12 zosyn x 2  6/13 meropenem >>  SIGNIFICANT EVENTS: 6/11 Admitted - tx to [facility] for OR  LINES/TUBES: PIV 6/12 R Radial Aline >>  DISCUSSION: 42 yoF with dementia, Afib not on AC, chronic sacral wound, DM admitted 6/11 with possible UTI/ PNA found to have acute/chronic PE and acute SMA thrombosis.  Not surgical candidate.  Attempted mechanical thrombectomy in OR unsuccessful.  Continued tx with heparin gtt.  Poor long-term prognosis. Ongoing GOC discussions with family but remains full code, PMT to see 6/13 am.  ASSESSMENT / PLAN:  PULMONARY A: At risk for respiratory insufficiency Acute / chronic PE Bibasilar PNA - possible aspiration given recent coughing/ choking episodes  P:   ABG - 7.425/28.9/173/19.4 on 3L [place] Supplemental O2 Continue heparin gtt  See ID  SLP consult if warranted  Ongoing GOC discussions with family  CARDIOVASCULAR A:  Chronic systolic and diastolic HF Afib HTN P:  Tele monitor Can transfer to SDU as is hemodynamically  stable D/c Aline [facility] Continue diltiazem gtt for rate control  Goal MAP > 65 Trend lactate Hold diuresis for now  RENAL A:   Lactic acidosis - r/t possible bowel ischemia and +/- dehydration At risk for AKI P:   Change IVF to 1/2 NS at 50 ml Trend BMP / urinary output Replace electrolytes as indicated  GASTROINTESTINAL A:   Acute SMA occlusion - not surgical candidate/ mech thrombectomy unsuccessful, no further tx per Vascular R/o dysphagia P:   NPO for now SLP when acute encephalopathy improves but this may be related to her advanced dementia PMT to see in AM for ongoing [place] discussions, appreciate assistance  HEMATOLOGIC A:   Leukocytosis  P:  Trend CBC Heparin systemically   INFECTIOUS A:   PNA Possible UTI-  Chronic  sacral decub P:   D/c zosyn, change to meropenem given past urine culture with E.coli with multiple resistance, concern for ESBL Follow cultures  Trend WBC/ fever   ENDOCRINE A:   DM   P:   D/c lantus while NPO D/c'd dextrose in IVF CBG q 4/ SSI  NEUROLOGIC A:   Acute encephalopathy- multifactorial given hx of dementia and infection P:   Monitor Fall precautions No sedating meds   FAMILY  - Updates:  Family updated at bedside by Dr. Jimmey Ralph.    Global: she is protecting her airway and remains afebrile and hemodynamically stable.  Will transfer to SDU.   - Inter-disciplinary family meet or Palliative Care meeting due by:  Ongoing   Kennieth Rad, AGACNP-BC [facility] Pgr: [phone] or if no answer [phone] 02/01/2018, 12:22 AM

## 2018-01-31 NOTE — Progress Notes (Signed)
CRITICAL VALUE ALERT  Critical Value: Troponin 0.21 Date & Time Notied:  01/31/2018 2258  Provider Notified: text paged Dr. [place] Crane 01/31/2018 1542 Orders Received/Actions taken: awaiting call back

## 2018-01-31 NOTE — Anesthesia Preprocedure Evaluation (Signed)
Anesthesia Evaluation  Patient identified by MRN, date of birth, ID band Patient awake    Reviewed: Allergy & Precautions, NPO status , Patient's Chart, lab work & pertinent test results, reviewed documented beta blocker date and time   Airway Mallampati: II  TM Distance: >3 FB Neck ROM: Full    Dental no notable dental hx.    Pulmonary shortness of breath, pneumonia,    Pulmonary exam normal breath sounds clear to auscultation       Cardiovascular hypertension, Pt. on home beta blockers and Pt. on medications + Peripheral Vascular Disease and +CHF  Normal cardiovascular exam+ dysrhythmias  Rhythm:Regular Rate:Normal  Echo 08/2016 - Left ventricle: The cavity size was normal. Wall thickness was normal. Systolic function was moderately reduced. The estimated ejection fraction was in the range of 35% to 40%. Moderate diffuse hypokinesis with no identifiable regional variations. Acoustic contrast opacification revealed no evidence ofthrombus. - Ventricular septum: Septal motion showed abnormal function,  dyssynergy, and paradox. These changes are consistent with a left bundle branch block. - Left atrium: The atrium was mildly dilated.   Neuro/Psych PSYCHIATRIC DISORDERS Dementia negative neurological ROS     GI/Hepatic Neg liver ROS, GERD  ,  Endo/Other  diabetesHyperthyroidism   Renal/GU Renal InsufficiencyRenal disease     Musculoskeletal negative musculoskeletal ROS (+)   Abdominal   Peds  Hematology negative hematology ROS (+)   Anesthesia Other Findings   Reproductive/Obstetrics negative OB ROS                                                              Anesthesia Evaluation  Patient identified by MRN, date of birth, ID band Patient awake    Reviewed: Allergy & Precautions, NPO status , Patient's Chart, lab work & pertinent test results  Airway Mallampati: II       Dental    Pulmonary shortness of breath,    breath sounds clear to auscultation       Cardiovascular hypertension, +CHF  + dysrhythmias  Rhythm:Irregular     Neuro/Psych  Neuromuscular disease    GI/Hepatic GERD  ,  Endo/Other  diabetesHyperthyroidism   Renal/GU CRFRenal disease     Musculoskeletal   Abdominal   Peds  Hematology   Anesthesia Other Findings   Reproductive/Obstetrics                             Anesthesia Physical Anesthesia Plan  ASA: IV  Anesthesia Plan: MAC   Post-op Pain Management:    Induction: Intravenous  PONV Risk Score and Plan: 2 and Ondansetron and Dexamethasone  Airway Management Planned: Natural Airway and Nasal Cannula  Additional Equipment:   Intra-op Plan:   Post-operative Plan:   Informed Consent: I have reviewed the patients History and Physical, chart, labs and discussed the procedure including the risks, benefits and alternatives for the proposed anesthesia with the patient or authorized representative who has indicated his/her understanding and acceptance.     Plan Discussed with: CRNA  Anesthesia Plan Comments:         Anesthesia Quick Evaluation  Anesthesia Physical  Anesthesia Plan  ASA: V and emergent  Anesthesia Plan: MAC   Post-op Pain Management:    Induction: Intravenous  PONV Risk Score  and Plan: 2  Airway Management Planned: Simple Face Mask  Additional Equipment: Arterial line  Intra-op Plan:   Post-operative Plan: Extubation in OR  Informed Consent: I have reviewed the patients History and Physical, chart, labs and discussed the procedure including the risks, benefits and alternatives for the proposed anesthesia with the patient or authorized representative who has indicated his/her understanding and acceptance.   Dental advisory given  Plan Discussed with: CRNA  Anesthesia Plan Comments: (Discussed with Dr. Trula Slade. He feels she is too tenuous for GA,  and would likely not ever be extubatable. )        Anesthesia Quick Evaluation

## 2018-01-31 NOTE — Progress Notes (Signed)
CRITICAL VALUE ALERT  Critical Value:  Lactic 3.2  Date & Time Notied:  01/31/2018 1505  Provider Notified: text paged Dr. [place] Crane 01/31/2018 1542 Orders Received/Actions taken:trending down

## 2018-01-31 NOTE — Progress Notes (Addendum)
PROGRESS NOTE  Anna [name] BTD:[id number] DOB: 04-May-1929 DOA: 01/30/2018 PCP: Dorothyann Peng, NP  HPI/Recap of past 24 hours: Anna [name] is an 84 year old female with past medical history significant for Dementia, systolic congestive heart failure with EF 35%, chronic A. fib not on oral anticoagulation who was brought to the ED from home by her daughter with concern for lethargy.  Positive UA and chest x-ray concerning for bilateral lower lobe infiltrates.  Admitted for sepsis secondary to UTI versus pneumonia and acute dehydration.  01/31/18: Seen and examined with her daughter at bedside. Continues to be lethargic. Arousable to voices.  UPDATE: CTA PE remarkable for acute/subacute multiple PE CT abd pelvis w contrast remarkable for acute thombosis of the SMA which appears near occlusive. Heparin drip started stat. Vascular surgery consulted. Highly appreciated.  Assessment/Plan: Active Problems:   Benign essential HTN   DM (diabetes mellitus) type II controlled with renal manifestation (HCC)   Lower urinary tract infectious disease   Atrial fibrillation with RVR (HCC)   Acute on chronic diastolic heart failure (HCC)   Acute metabolic encephalopathy   Alzheimer's dementia with behavioral disturbance   Pneumonia   Pressure injury of skin  Acute metabolic encephalopathy most likely multifactorial secondary to urinary tract infection, community-acquired pneumonia, dehydration versus others in the setting of dementia CT head done on 01/30/2018 no acute intracranial abnormalities. Reorient as indicated Fall precautions  Acute/subacute multiple PE CTA PE done on 01/31/18 revealed: Acute/subacute pulmonary embolism in segmental/subsegmental left upper lobe pulmonary artery branches. Evidence of chronic pulmonary embolism in the right upper lobe. No central pulmonary embolism. 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 Highly appreciate input.  Lactic acidosis most likely 2/2 to ischemia vs others Lactic acid trending down from 6.3 to 3.2 C/w IV fluid and hep drip  Sepsis secondary to UTI and bilateral lower lobe community-acquired pneumonia vs others Lactic acid 6.3 Procalcitonin 1.34 WBC 26k Obtain CT chest and CT abd/pelvis w contrast looking for other source of infection with worsening lactic acidosis and leukocytosis Continue IV Zosyn which is also indicated for suspected aspiration pneumonia Urine culture in process Blood cultures in process Continue IV azithromycin Obtain MRSA screening  Acute dehydration BUN to creatinine ratio greater than 20 Gentle IV hydration Monitor urine output Monitor for signs of fluid overload  Chronic systolic CHF Last 2D echo with LVEF 35% from 09/16/2016 with moderate diffuse hypokinesis and no identifiable regional variations.  Chronic A. fib Not on anticoagulation at home Rate controlled Personally reviewed EKG done on admission which revealed left bundle branch block Troponin ordered x1-unable to get a history due to altered mental status in the setting of dementia  A. fib RVR May be driven by sepsis Continue mild IV fluid hydration  Anion gap metabolic acidosis suspect secondary to lactic acidosis Anion gap of 15 Lactic acid 6.3 Continue IV fluid Repeat BMP in 4 hours  Hypernatremia Sodium 149 from 145 Encourage oral fluid hydration Currently on half-normal saline at 75cc/h Change IV fluid to D5 1/4NS due to worsening hypernatremia Repeat BMP in 4 hours  Hyperglycemia in the setting of type 2 diabetes A1C 6.0 (01/31/18) BS in the 300's Started lantus 8U daily C/w Moderate ISS NPO due to lethargy ACcuchecks q4H Avoid hypoglycemia On D51/4NS due to hypernatremia and dehydration  Goals of care Palliative care team  consulted for  goals of care Poor prognosis with acute SMA thrombosis and acute/subacute multiple PE  Risks: High risk for decompensation due to acute SMA thrombosis, acute/subacute PE, severe sepsis with lactic acidosis, multiple commorbidities, advanced age and dementia.  Level of care: Level of care changed on 01/31/18 to ICU from step down due to high risk for decompensation and requiring higher level of care.   Code Status: full code  Family Communication: daughter at bedside  Disposition Plan: Home when clinically stable.     Consultants:  None  Procedures:  None  Antimicrobials:  IV zosyn and IV azithromycin  DVT prophylaxis:  sq lovenox daily   Objective: Vitals:   01/31/18 1000 01/31/18 1030 01/31/18 1100 01/31/18 1130  BP: (!) 157/86 (!) 170/84 (!) 174/68 (!) 163/105  Pulse: 95 (!) 106 84 (!) 59  Resp: (!) 29 (!) 29 (!) 29 (!) 35  Temp:      TempSrc:      SpO2: 97% 96% 96% 97%  Weight:      Height:        Intake/Output Summary (Last 24 hours) at 01/31/2018 1338 Last data filed at 01/31/2018 1137 Gross per 24 hour  Intake 2885 ml  Output 1025 ml  Net 1860 ml   Filed Weights   01/31/18 0630  Weight: 63.6 kg (140 lb 3.4 oz)    Exam:  . General: 84 y.o. year-old female well developed well nourished in no acute distress.  Lethargic but arousable to voices. . Cardiovascular: Regular rate and rhythm with no rubs or gallops.  No thyromegaly or JVD noted.   Marland Kitchen Respiratory: Clear to auscultation with no wheezes or rales. Poor inspiratory effort. . Abdomen: Soft nontender nondistended with hypoactive bowel sounds. . Musculoskeletal: No lower extremity edema. 2/4 pulses in all 4 extremities. . Skin: No ulcerative lesions noted or rashes, . Psychiatry: Unable to assess due to altered mental status.  Data Reviewed: CBC: Recent Labs  Lab 01/30/18 1635 01/31/18 1205  WBC 9.8 26.2*  HGB 15.9* 15.7*  HCT 48.8* 48.5*  MCV 94.8 94.4  PLT 267 546    Basic Metabolic Panel: Recent Labs  Lab 01/30/18 1635 01/31/18 1205  NA 145 149*  K 3.6 3.8  CL 107 116*  CO2 26 18*  GLUCOSE 225* 361*  BUN 34* 34*  CREATININE 1.18* 1.10*  CALCIUM 10.3 9.7   GFR: Estimated Creatinine Clearance: 32.4 mL/min (A) (by C-G formula based on SCr of 1.1 mg/dL (H)). Liver Function Tests: Recent Labs  Lab 01/30/18 1649  AST 25  ALT 13*  ALKPHOS 82  BILITOT 0.8  PROT 7.3  ALBUMIN 4.0   No results for input(s): LIPASE, AMYLASE in the last 168 hours. No results for input(s): AMMONIA in the last 168 hours. Coagulation Profile: No results for input(s): INR, PROTIME in the last 168 hours. Cardiac Enzymes: No results for input(s): CKTOTAL, CKMB, CKMBINDEX, TROPONINI in the last 168 hours. BNP (last 3 results) No results for input(s): PROBNP in the last 8760 hours. HbA1C: No results for input(s): HGBA1C in the last 72 hours. CBG: Recent Labs  Lab 01/30/18 1656 01/31/18 0840 01/31/18 1252  GLUCAP 171* 364* 337*   Lipid Profile: No results for input(s): CHOL, HDL, LDLCALC, TRIG, CHOLHDL, LDLDIRECT in the last 72 hours. Thyroid Function Tests: No results for input(s): TSH, T4TOTAL, FREET4, T3FREE, THYROIDAB in the last 72 hours. Anemia Panel: No results for input(s): VITAMINB12, FOLATE, FERRITIN, TIBC, IRON, RETICCTPCT in the last 72 hours. Urine analysis:  Component Value Date/Time   COLORURINE YELLOW 01/30/2018 2009   APPEARANCEUR CLEAR 01/30/2018 2009   LABSPEC 1.016 01/30/2018 2009   PHURINE 5.0 01/30/2018 2009   GLUCOSEU NEGATIVE 01/30/2018 2009   HGBUR NEGATIVE 01/30/2018 2009   BILIRUBINUR NEGATIVE 01/30/2018 2009   BILIRUBINUR Negative 02/11/2017 1234   KETONESUR 5 (A) 01/30/2018 2009   PROTEINUR NEGATIVE 01/30/2018 2009   UROBILINOGEN 0.2 02/11/2017 1234   UROBILINOGEN 0.2 01/27/2015 1353   NITRITE NEGATIVE 01/30/2018 2009   LEUKOCYTESUR LARGE (A) 01/30/2018 2009   Sepsis  Labs: @LABRCNTIP (procalcitonin:4,lacticidven:4)  ) Recent Results (from the past 240 hour(s))  MRSA PCR Screening     Status: None   Collection Time: 01/31/18  6:48 AM  Result Value Ref Range Status   MRSA by PCR NEGATIVE NEGATIVE Final    Comment:        The GeneXpert MRSA Assay (FDA approved for NASAL specimens only), is one component of a comprehensive MRSA colonization surveillance program. It is not intended to diagnose MRSA infection nor to guide or monitor treatment for MRSA infections. Performed at [facility], [place] [address], [place], [place] [address]       Studies: Ct Head Wo Contrast  Result Date: 01/30/2018 CLINICAL DATA:  Altered mental status, altered level of consciousness today, less responsive, history Alzheimer's, atrial fibrillation, bronchitis, CHF, stage III chronic renal disease, hypertension EXAM: CT HEAD WITHOUT CONTRAST TECHNIQUE: Contiguous axial images were obtained from the base of the skull through the vertex without intravenous contrast. Sagittal and coronal MPR images reconstructed from axial data set. COMPARISON:  12/30/2016 FINDINGS: Brain: Generalized atrophy. Normal ventricular morphology. No midline shift or mass effect. Minimal vessel chronic ischemic changes of deep cerebral white matter. No intracranial hemorrhage, mass lesion, evidence of acute infarction, or extra-axial fluid collection. Vascular: No hyperdense vessels. Minimal atherosclerotic calcification of internal carotid arteries at skull base. Skull: Intact Sinuses/Orbits: Clear Other: N/A IMPRESSION: Atrophy with minimal small vessel chronic ischemic changes of deep cerebral white matter. No acute intracranial abnormalities. Electronically Signed   By: Lavonia Dana M.D.   On: 01/30/2018 19:08   Dg Chest Port 1 View  Result Date: 01/30/2018 CLINICAL DATA:  Diaphoresis, lethargy, history Alzheimer's, atrial fibrillation, CHF, stage III chronic renal insufficiency,  diabetes mellitus, hypertension, recent surgery for coccygeal abscess EXAM: PORTABLE CHEST 1 VIEW COMPARISON:  Portable exam 1844 hours compared to 07/27/2017 FINDINGS: Enlargement of cardiac silhouette with pulmonary vascular congestion. Atherosclerotic calcification aorta. Patchy opacity at LEFT base could represent infiltrate or atelectasis in LEFT lower lobe. Remaining lungs grossly clear. Skin fold projects over LEFT upper chest. No pneumothorax or gross pleural effusion. Bones demineralized. IMPRESSION: Mild patchy LEFT basilar opacity question mild atelectasis or infiltrate in LEFT lower lobe. Electronically Signed   By: Lavonia Dana M.D.   On: 01/30/2018 18:59    Scheduled Meds: . enoxaparin (LOVENOX) injection  40 mg Subcutaneous Q24H  . insulin aspart  0-9 Units Subcutaneous TID WC  . mouth rinse  15 mL Mouth Rinse BID  . metoprolol tartrate  5 mg Intravenous Q6H    Continuous Infusions: . 0.45 % NaCl with KCl 20 mEq / L 75 mL/hr at 01/31/18 0740  . azithromycin    . diltiazem (CARDIZEM) infusion 5 mg/hr (01/31/18 0459)  . piperacillin-tazobactam (ZOSYN)  IV Stopped (01/31/18 1137)     LOS: 1 day     Kayleen Memos, MD [facility] Pager [phone]  If 7PM-7AM, please contact night-coverage [email or web address] Password [facility] 01/31/2018, 1:38  PM    

## 2018-01-31 NOTE — Progress Notes (Signed)
[facility]  Patient Status: Active (receiving services up to time of hospitalization)  [facility] is providing the following services: RN  If patient discharges after hours, please call [phone].   Anna [name] 01/31/2018, 10:02 AM

## 2018-01-31 NOTE — ED Notes (Signed)
ED TO INPATIENT HANDOFF REPORT  Name/Age/Gender Anna [name] 84 y.o. female  Code Status    Code Status Orders  (From admission, onward)        Start     Ordered   01/31/18 0600  Full code  Continuous     01/31/18 0559    Code Status History    Date Active Date Inactive Code Status Order ID Comments User Context   07/10/2017 0347 07/17/2017 1832 Full Code [id number]  Etta Quill, DO ED   09/15/2016 0931 09/17/2016 1939 Full Code [id number]  Mariel Aloe, MD Inpatient   02/05/2016 1623 02/09/2016 1902 Full Code [id number]  Rondel Jumbo, PA-C ED   08/26/2015 2142 09/01/2015 1503 Full Code [id number]  Norval Morton, MD ED   01/27/2015 1534 01/28/2015 2106 Full Code [id number]  Ghimire, Henreitta Leber, MD Inpatient      Home/SNF/Other Home  Chief Complaint Dehydration  Level of Care/Admitting Diagnosis ED Disposition    ED Disposition Condition [facility]: [place] [[id number]]  Level of Care: Stepdown [14]  Admit to SDU based on following criteria: Other see comments  Admit to SDU based on following criteria: Cardiac Instability:  Patients experiencing chest pain, unconfirmed MI and stable, arrhythmias and CHF requiring medical management and potentially compromising patient's stability  Comments: Pneumonia  Diagnosis: Pneumonia [[id number]]  Admitting Physician: Kara Pacer  Attending Physician: Bethena Roys Nessa.Cuff  Estimated length of stay: past midnight tomorrow  Certification:: I certify this patient will need inpatient services for at least 2 midnights  PT Class (Do Not Modify): Inpatient [101]  PT Acc Code (Do Not Modify): Private [1]       Medical History Past Medical History:  Diagnosis Date  . Alzheimer's disease   . Atrial fibrillation ([place])   . Bronchitis   . CHF (congestive heart failure) ([place])   . Chronic renal insufficiency, stage III (moderate) (HCC)   . Diabetes mellitus without  complication ([place])   . Dyspnea    with exertion  . GERD (gastroesophageal reflux disease)   . GI bleeding 11/2013   due to supratherapeutic INR  . Hyperlipidemia   . Hypertension   . Hyperthyroidism    on amiodarone  . LBBB (left bundle branch block)   . Overactive bladder   . Overweight   . Persistent atrial fibrillation ([place])   . Pneumonia   . Thyroid mass    LLL  . UTI (urinary tract infection)   . Vitamin B12 deficiency   . Vitamin D deficiency     Allergies Allergies  Allergen Reactions  . Pollen Extract     Sneezing, watery eyes    IV Location/Drains/Wounds Patient Lines/Drains/Airways Status   Active Line/Drains/Airways    Name:   Placement date:   Placement time:   Site:   Days:   Peripheral IV 01/30/18 Left Hand   01/30/18    1900    Hand   1   PICC Single Lumen 63/87/56 PICC Right Basilic 36 cm   43/32/95    1884    Basilic   166   External Urinary Catheter   07/10/17    0530    -   205   Incision (Closed) 07/11/17 Sacrum Other (Comment)   07/11/17    1305     204   Wound / Incision (Open or Dehisced) 07/10/17 Other (Comment) Coccyx Mid dime size open area with tunneling  07/10/17    0530    Coccyx   205          Labs/Imaging Results for orders placed or performed during the hospital encounter of 01/30/18 (from the past 48 hour(s))  Basic metabolic panel     Status: Abnormal   Collection Time: 01/30/18  4:35 PM  Result Value Ref Range   Sodium 145 135 - 145 mmol/L   Potassium 3.6 3.5 - 5.1 mmol/L   Chloride 107 101 - 111 mmol/L   CO2 26 22 - 32 mmol/L   Glucose, Bld 225 (H) 65 - 99 mg/dL   BUN 34 (H) 6 - 20 mg/dL   Creatinine, Ser 1.18 (H) 0.44 - 1.00 mg/dL   Calcium 10.3 8.9 - 10.3 mg/dL   GFR calc non Af Amer 40 (L) >60 mL/min   GFR calc Af Amer 47 (L) >60 mL/min    Comment: (NOTE) The eGFR has been calculated using the CKD EPI equation. This calculation has not been validated in all clinical situations. eGFR's persistently <60 mL/min signify  possible Chronic Kidney Disease.    Anion gap 12 5 - 15    Comment: Performed at [facility], [place] [address] [place], [place] [address]  CBC     Status: Abnormal   Collection Time: 01/30/18  4:35 PM  Result Value Ref Range   WBC 9.8 4.0 - 10.5 K/uL   RBC 5.15 (H) 3.87 - 5.11 MIL/uL   Hemoglobin 15.9 (H) 12.0 - 15.0 g/dL   HCT 48.8 (H) 36.0 - 46.0 %   MCV 94.8 78.0 - 100.0 fL   MCH 30.9 26.0 - 34.0 pg   MCHC 32.6 30.0 - 36.0 g/dL   RDW 14.8 11.5 - 15.5 %   Platelets 267 150 - 400 K/uL    Comment: Performed at [facility], [place] [address], [place], [place] [address]  Hepatic function panel     Status: Abnormal   Collection Time: 01/30/18  4:49 PM  Result Value Ref Range   Total Protein 7.3 6.5 - 8.1 g/dL   Albumin 4.0 3.5 - 5.0 g/dL   AST 25 15 - 41 U/L   ALT 13 (L) 14 - 54 U/L   Alkaline Phosphatase 82 38 - 126 U/L   Total Bilirubin 0.8 0.3 - 1.2 mg/dL   Bilirubin, Direct 0.1 0.1 - 0.5 mg/dL   Indirect Bilirubin 0.7 0.3 - 0.9 mg/dL    Comment: Performed at [facility], [place] [address], [place], [place] [address]  CBG monitoring, ED     Status: Abnormal   Collection Time: 01/30/18  4:56 PM  Result Value Ref Range   Glucose-Capillary 171 (H) 65 - 99 mg/dL  Urinalysis, Routine w reflex microscopic     Status: Abnormal   Collection Time: 01/30/18  8:09 PM  Result Value Ref Range   Color, Urine YELLOW YELLOW   APPearance CLEAR CLEAR   Specific Gravity, Urine 1.016 1.005 - 1.030   pH 5.0 5.0 - 8.0   Glucose, UA NEGATIVE NEGATIVE mg/dL   Hgb urine dipstick NEGATIVE NEGATIVE   Bilirubin Urine NEGATIVE NEGATIVE   Ketones, ur 5 (A) NEGATIVE mg/dL   Protein, ur NEGATIVE NEGATIVE mg/dL   Nitrite NEGATIVE NEGATIVE   Leukocytes, UA LARGE (A) NEGATIVE   RBC / HPF 0-5 0 - 5 RBC/hpf   WBC, UA 0-5 0 - 5 WBC/hpf   Bacteria, UA MANY (A) NONE SEEN   Mucus PRESENT  Comment: Performed at [facility], [place]  [address] [place], [place] [address]  I-stat troponin, ED     Status: None   Collection Time: 01/30/18  8:26 PM  Result Value Ref Range   Troponin i, poc 0.02 0.00 - 0.08 ng/mL   Comment 3            Comment: Due to the release kinetics of cTnI, a negative result within the first hours of the onset of symptoms does not rule out myocardial infarction with certainty. If myocardial infarction is still suspected, repeat the test at appropriate intervals.   I-Stat CG4 Lactic Acid, ED     Status: Abnormal   Collection Time: 01/30/18  8:28 PM  Result Value Ref Range   Lactic Acid, Venous 4.75 (HH) 0.5 - 1.9 mmol/L   Comment NOTIFIED PHYSICIAN   I-Stat CG4 Lactic Acid, ED     Status: Abnormal   Collection Time: 01/31/18  1:22 AM  Result Value Ref Range   Lactic Acid, Venous 5.42 (HH) 0.5 - 1.9 mmol/L   Comment NOTIFIED PHYSICIAN    Ct Head Wo Contrast  Result Date: 01/30/2018 CLINICAL DATA:  Altered mental status, altered level of consciousness today, less responsive, history Alzheimer's, atrial fibrillation, bronchitis, CHF, stage III chronic renal disease, hypertension EXAM: CT HEAD WITHOUT CONTRAST TECHNIQUE: Contiguous axial images were obtained from the base of the skull through the vertex without intravenous contrast. Sagittal and coronal MPR images reconstructed from axial data set. COMPARISON:  12/30/2016 FINDINGS: Brain: Generalized atrophy. Normal ventricular morphology. No midline shift or mass effect. Minimal vessel chronic ischemic changes of deep cerebral white matter. No intracranial hemorrhage, mass lesion, evidence of acute infarction, or extra-axial fluid collection. Vascular: No hyperdense vessels. Minimal atherosclerotic calcification of internal carotid arteries at skull base. Skull: Intact Sinuses/Orbits: Clear Other: N/A IMPRESSION: Atrophy with minimal small vessel chronic ischemic changes of deep cerebral white matter. No acute intracranial abnormalities. Electronically  Signed   By: Lavonia Dana M.D.   On: 01/30/2018 19:08   Dg Chest Port 1 View  Result Date: 01/30/2018 CLINICAL DATA:  Diaphoresis, lethargy, history Alzheimer's, atrial fibrillation, CHF, stage III chronic renal insufficiency, diabetes mellitus, hypertension, recent surgery for coccygeal abscess EXAM: PORTABLE CHEST 1 VIEW COMPARISON:  Portable exam 1844 hours compared to 07/27/2017 FINDINGS: Enlargement of cardiac silhouette with pulmonary vascular congestion. Atherosclerotic calcification aorta. Patchy opacity at LEFT base could represent infiltrate or atelectasis in LEFT lower lobe. Remaining lungs grossly clear. Skin fold projects over LEFT upper chest. No pneumothorax or gross pleural effusion. Bones demineralized. IMPRESSION: Mild patchy LEFT basilar opacity question mild atelectasis or infiltrate in LEFT lower lobe. Electronically Signed   By: Lavonia Dana M.D.   On: 01/30/2018 18:59    Pending Labs Unresulted Labs (From admission, onward)   Start     Ordered   01/31/18 0509  Culture, blood (routine x 2)  BLOOD CULTURE X 2,   R     01/31/18 0508   01/31/18 0400  Lactic acid, plasma  Once,   R     01/31/18 0131   01/30/18 2032  Urine culture  STAT,   STAT     01/30/18 2031      Vitals/Pain Today's Vitals   01/31/18 0330 01/31/18 0415 01/31/18 0500 01/31/18 0504  BP: (!) 160/109 (!) 165/116 (!) 161/107 137/73  Pulse: (!) 143 (!) 139 (!) 155 77  Resp: (!) 38 (!) 29 (!) 43 (!) 43  Temp:  TempSrc:      SpO2: 92% 91% 93% 93%    Isolation Precautions No active isolations  Medications Medications  enoxaparin (LOVENOX) injection 40 mg (has no administration in time range)  0.45 % NaCl with KCl 20 mEq / L infusion (has no administration in time range)  ondansetron (ZOFRAN) tablet 4 mg (has no administration in time range)    Or  ondansetron (ZOFRAN) injection 4 mg (has no administration in time range)  metoprolol tartrate (LOPRESSOR) injection 5 mg (5 mg Intravenous Given  01/31/18 0018)  azithromycin (ZITHROMAX) 500 mg in sodium chloride 0.9 % 250 mL IVPB (has no administration in time range)  acetaminophen (TYLENOL) suppository 650 mg (has no administration in time range)  diltiazem (CARDIZEM) 100 mg in dextrose 5% 117m (1 mg/mL) infusion (5 mg/hr Intravenous New Bag/Given 01/31/18 0459)  insulin aspart (novoLOG) injection 0-9 Units (has no administration in time range)  piperacillin-tazobactam (ZOSYN) IVPB 3.375 g (has no administration in time range)  sodium chloride 0.9 % bolus 500 mL (0 mLs Intravenous Stopped 01/30/18 1940)  cefTRIAXone (ROCEPHIN) 1 g in sodium chloride 0.9 % 100 mL IVPB (0 g Intravenous Stopped 01/30/18 2347)  sodium chloride 0.9 % bolus 500 mL (0 mLs Intravenous Stopped 01/30/18 2159)  azithromycin (ZITHROMAX) 500 mg in sodium chloride 0.9 % 250 mL IVPB (0 mg Intravenous Stopped 01/31/18 0048)  sodium chloride 0.9 % bolus 1,000 mL (0 mLs Intravenous Stopped 01/31/18 0359)  diltiazem (CARDIZEM) 1 mg/mL load via infusion 15 mg (15 mg Intravenous Bolus from Bag 01/31/18 0459)    Mobility non-ambulatory

## 2018-01-31 NOTE — Progress Notes (Signed)
Pharmacy Antibiotic Note  Anna [name] is a 84 y.o. female admitted on 01/30/2018 with pneumonia.  Pharmacy has been consulted for Unasyn dosing for aspiration PNA.   Plan: Unasyn 3 Gm IV q6h F/u wt and ht when patient is on floor     Temp (24hrs), Avg:97.9 F (36.6 C), Min:97.9 F (36.6 C), Max:97.9 F (36.6 C)  Recent Labs  Lab 01/30/18 1635 01/30/18 2028  WBC 9.8  --   CREATININE 1.18*  --   LATICACIDVEN  --  4.75*    CrCl cannot be calculated (Unknown ideal weight.).    Allergies  Allergen Reactions  . Pollen Extract     Sneezing, watery eyes    Antimicrobials this admission: 6/11 roc/zmax >> x1 ED 6/12  unasyn >>   Dose adjustments this admission:   Microbiology results:  BCx:   UCx:    Sputum:    MRSA PCR:   Thank you for allowing pharmacy to be a part of this patient's care.  Dorrene German 01/31/2018 12:28 AM

## 2018-02-01 ENCOUNTER — Encounter (HOSPITAL_COMMUNITY): Payer: Self-pay | Admitting: Surgery

## 2018-02-01 ENCOUNTER — Inpatient Hospital Stay (HOSPITAL_COMMUNITY): Payer: Medicare HMO

## 2018-02-01 DIAGNOSIS — A419 Sepsis, unspecified organism: Principal | ICD-10-CM

## 2018-02-01 DIAGNOSIS — R6520 Severe sepsis without septic shock: Secondary | ICD-10-CM

## 2018-02-01 DIAGNOSIS — R4189 Other symptoms and signs involving cognitive functions and awareness: Secondary | ICD-10-CM

## 2018-02-01 DIAGNOSIS — I2699 Other pulmonary embolism without acute cor pulmonale: Secondary | ICD-10-CM

## 2018-02-01 DIAGNOSIS — Z515 Encounter for palliative care: Secondary | ICD-10-CM

## 2018-02-01 DIAGNOSIS — G9341 Metabolic encephalopathy: Secondary | ICD-10-CM

## 2018-02-01 DIAGNOSIS — I361 Nonrheumatic tricuspid (valve) insufficiency: Secondary | ICD-10-CM

## 2018-02-01 DIAGNOSIS — N3000 Acute cystitis without hematuria: Secondary | ICD-10-CM

## 2018-02-01 DIAGNOSIS — I4891 Unspecified atrial fibrillation: Secondary | ICD-10-CM

## 2018-02-01 DIAGNOSIS — J690 Pneumonitis due to inhalation of food and vomit: Secondary | ICD-10-CM

## 2018-02-01 DIAGNOSIS — Z7189 Other specified counseling: Secondary | ICD-10-CM

## 2018-02-01 DIAGNOSIS — K55069 Acute infarction of intestine, part and extent unspecified: Secondary | ICD-10-CM

## 2018-02-01 LAB — GLUCOSE, CAPILLARY
GLUCOSE-CAPILLARY: 139 mg/dL — AB (ref 65–99)
GLUCOSE-CAPILLARY: 149 mg/dL — AB (ref 65–99)
GLUCOSE-CAPILLARY: 158 mg/dL — AB (ref 65–99)
Glucose-Capillary: 107 mg/dL — ABNORMAL HIGH (ref 65–99)
Glucose-Capillary: 150 mg/dL — ABNORMAL HIGH (ref 65–99)
Glucose-Capillary: 159 mg/dL — ABNORMAL HIGH (ref 65–99)
Glucose-Capillary: 159 mg/dL — ABNORMAL HIGH (ref 65–99)

## 2018-02-01 LAB — BASIC METABOLIC PANEL
ANION GAP: 12 (ref 5–15)
BUN: 33 mg/dL — ABNORMAL HIGH (ref 6–20)
CHLORIDE: 118 mmol/L — AB (ref 101–111)
CO2: 17 mmol/L — AB (ref 22–32)
Calcium: 9.1 mg/dL (ref 8.9–10.3)
Creatinine, Ser: 1.23 mg/dL — ABNORMAL HIGH (ref 0.44–1.00)
GFR calc non Af Amer: 38 mL/min — ABNORMAL LOW (ref >60)
GFR, EST AFRICAN AMERICAN: 44 mL/min — AB (ref >60)
Glucose, Bld: 187 mg/dL — ABNORMAL HIGH (ref 65–99)
POTASSIUM: 3.7 mmol/L (ref 3.5–5.1)
SODIUM: 147 mmol/L — AB (ref 135–145)

## 2018-02-01 LAB — HEPARIN LEVEL (UNFRACTIONATED)
Heparin Unfractionated: 0.55 IU/mL (ref 0.30–0.70)
Heparin Unfractionated: 0.55 IU/mL (ref 0.30–0.70)

## 2018-02-01 LAB — LACTIC ACID, PLASMA
LACTIC ACID, VENOUS: 1.9 mmol/L (ref 0.5–1.9)
Lactic Acid, Venous: 2.2 mmol/L (ref 0.5–1.9)
Lactic Acid, Venous: 2.2 mmol/L (ref 0.5–1.9)

## 2018-02-01 LAB — POCT I-STAT 3, ART BLOOD GAS (G3+)
Acid-base deficit: 5 mmol/L — ABNORMAL HIGH (ref 0.0–2.0)
BICARBONATE: 19.4 mmol/L — AB (ref 20.0–28.0)
O2 SAT: 100 %
PCO2 ART: 28.9 mmHg — AB (ref 32.0–48.0)
PH ART: 7.425 (ref 7.350–7.450)
PO2 ART: 173 mmHg — AB (ref 83.0–108.0)
Patient temperature: 94.1
TCO2: 20 mmol/L — AB (ref 22–32)

## 2018-02-01 LAB — CBC
HCT: 45.7 % (ref 36.0–46.0)
HEMOGLOBIN: 14.4 g/dL (ref 12.0–15.0)
MCH: 29.6 pg (ref 26.0–34.0)
MCHC: 31.5 g/dL (ref 30.0–36.0)
MCV: 93.8 fL (ref 78.0–100.0)
PLATELETS: 209 10*3/uL (ref 150–400)
RBC: 4.87 MIL/uL (ref 3.87–5.11)
RDW: 15.9 % — ABNORMAL HIGH (ref 11.5–15.5)
WBC: 27.6 10*3/uL — AB (ref 4.0–10.5)

## 2018-02-01 LAB — TROPONIN I
TROPONIN I: 0.25 ng/mL — AB (ref <0.03)
TROPONIN I: 0.13 ng/mL — AB (ref <0.03)
Troponin I: 0.2 ng/mL (ref <0.03)

## 2018-02-01 LAB — ECHOCARDIOGRAM COMPLETE
HEIGHTINCHES: 65 in
Weight: 2215.18 oz

## 2018-02-01 MED ORDER — PERFLUTREN LIPID MICROSPHERE
INTRAVENOUS | Status: AC
  Administered 2018-02-01: 2 mL
  Filled 2018-02-01: qty 10

## 2018-02-01 MED ORDER — ORAL CARE MOUTH RINSE
15.0000 mL | Freq: Two times a day (BID) | OROMUCOSAL | Status: DC
  Administered 2018-02-01 – 2018-02-06 (×11): 15 mL via OROMUCOSAL

## 2018-02-01 MED ORDER — PERFLUTREN LIPID MICROSPHERE
1.0000 mL | INTRAVENOUS | Status: AC | PRN
  Filled 2018-02-01: qty 10

## 2018-02-01 MED ORDER — CHLORHEXIDINE GLUCONATE 0.12 % MT SOLN
15.0000 mL | Freq: Two times a day (BID) | OROMUCOSAL | Status: DC
  Administered 2018-02-01 – 2018-02-06 (×12): 15 mL via OROMUCOSAL
  Filled 2018-02-01 (×5): qty 15

## 2018-02-01 MED ORDER — BISACODYL 10 MG RE SUPP
10.0000 mg | Freq: Every day | RECTAL | Status: DC | PRN
  Administered 2018-02-01 – 2018-02-04 (×2): 10 mg via RECTAL
  Filled 2018-02-01 (×2): qty 1

## 2018-02-01 MED ORDER — MEROPENEM 1 G IV SOLR
1.0000 g | Freq: Two times a day (BID) | INTRAVENOUS | Status: DC
  Administered 2018-02-01 – 2018-02-02 (×5): 1 g via INTRAVENOUS
  Filled 2018-02-01 (×6): qty 1

## 2018-02-01 NOTE — Progress Notes (Signed)
Progress Note  Patient Name: Anna [name] Date of Encounter: 02/01/2018  Primary Cardiologist: New  Subjective   Pt opened eyes earlier   WInces to pain   Inpatient Medications    Scheduled Meds: . chlorhexidine  15 mL Mouth Rinse BID  . insulin aspart  0-15 Units Subcutaneous Q4H  . mouth rinse  15 mL Mouth Rinse BID  . mouth rinse  15 mL Mouth Rinse q12n4p   Continuous Infusions: . sodium chloride 50 mL/hr at 02/01/18 0600  . diltiazem (CARDIZEM) infusion 15 mg/hr (02/01/18 0600)  . heparin 1,000 Units/hr (02/01/18 0600)  . meropenem (MERREM) IV Stopped (02/01/18 0145)   PRN Meds: acetaminophen, bisacodyl, metoprolol tartrate, ondansetron **OR** ondansetron (ZOFRAN) IV   Vital Signs    Vitals:   02/01/18 0500 02/01/18 0515 02/01/18 0530 02/01/18 0545  BP: 116/78     Pulse:      Resp: (!) 33 (!) 55 (!) 51 (!) 59  Temp:      TempSrc:      SpO2:  96% 96% 97%  Weight:      Height:        Intake/Output Summary (Last 24 hours) at 02/01/2018 0747 Last data filed at 02/01/2018 0600 Gross per 24 hour  Intake 2510.53 ml  Output 150 ml  Net 2360.53 ml   Filed Weights   01/31/18 0630 01/31/18 2300  Weight: 63.6 kg (140 lb 3.4 oz) 62.8 kg (138 lb 7.2 oz)    Telemetry    Afib   90s to 110s - Personally Reviewed  ECG      Physical Exam   GEN: not awake  .   Neck: No JVD Cardiac: Irreg irreg  no murmurs, rubs, or gallops.  Respiratory: Moving air throughout   GI: Deferred   MS: No edema      Labs    Chemistry Recent Labs  Lab 01/30/18 1635 01/30/18 1649 01/31/18 1205 01/31/18 1649 01/31/18 2205  NA 145  --  149* 143 149*  K 3.6  --  3.8 3.3* 2.8*  CL 107  --  116* 115*  --   CO2 26  --  18* 19*  --   GLUCOSE 225*  --  361* 294* 197*  BUN 34*  --  34* 33*  --   CREATININE 1.18*  --  1.10* 1.00  --   CALCIUM 10.3  --  9.7 9.1  --   PROT  --  7.3  --   --   --   ALBUMIN  --  4.0  --   --   --   AST  --  25  --   --   --   ALT  --  13*   --   --   --   ALKPHOS  --  82  --   --   --   BILITOT  --  0.8  --   --   --   GFRNONAA 40*  --  44* 49*  --   GFRAA 47*  --  51* 57*  --   ANIONGAP 12  --  15 9  --      Hematology Recent Labs  Lab 01/30/18 1635 01/31/18 1205 01/31/18 2205  WBC 9.8 26.2*  --   RBC 5.15* 5.14*  --   HGB 15.9* 15.7* 14.6  HCT 48.8* 48.5* 43.0  MCV 94.8 94.4  --   MCH 30.9 30.5  --   MCHC 32.6 32.4  --  RDW 14.8 15.2  --   PLT 267 251  --     Cardiac Enzymes Recent Labs  Lab 01/31/18 1418 02/01/18 0127  TROPONINI 0.21* 0.20*    Recent Labs  Lab 01/30/18 2026  TROPIPOC 0.02     BNPNo results for input(s): BNP, PROBNP in the last 168 hours.   DDimer  Recent Labs  Lab 01/31/18 1418  DDIMER 4.11*     Radiology    Ct Head Wo Contrast  Result Date: 01/30/2018 CLINICAL DATA:  Altered mental status, altered level of consciousness today, less responsive, history Alzheimer's, atrial fibrillation, bronchitis, CHF, stage III chronic renal disease, hypertension EXAM: CT HEAD WITHOUT CONTRAST TECHNIQUE: Contiguous axial images were obtained from the base of the skull through the vertex without intravenous contrast. Sagittal and coronal MPR images reconstructed from axial data set. COMPARISON:  12/30/2016 FINDINGS: Brain: Generalized atrophy. Normal ventricular morphology. No midline shift or mass effect. Minimal vessel chronic ischemic changes of deep cerebral white matter. No intracranial hemorrhage, mass lesion, evidence of acute infarction, or extra-axial fluid collection. Vascular: No hyperdense vessels. Minimal atherosclerotic calcification of internal carotid arteries at skull base. Skull: Intact Sinuses/Orbits: Clear Other: N/A IMPRESSION: Atrophy with minimal small vessel chronic ischemic changes of deep cerebral white matter. No acute intracranial abnormalities. Electronically Signed   By: Lavonia Dana M.D.   On: 01/30/2018 19:08   Ct Angio Chest Pe W Or Wo Contrast  Result Date:  01/31/2018 CLINICAL DATA:  Inpatient. Severe sepsis. Fever. Abdominal pain. Leukocytosis. Clinical concern for intra-abdominal abscess. EXAM: CT ANGIOGRAPHY CHEST CT ABDOMEN AND PELVIS WITH CONTRAST TECHNIQUE: Multidetector CT imaging of the chest was performed using the standard protocol during bolus administration of intravenous contrast. Multiplanar CT image reconstructions and MIPs were obtained to evaluate the vascular anatomy. Multidetector CT imaging of the abdomen and pelvis was performed using the standard protocol during bolus administration of intravenous contrast. CONTRAST:  54mL ISOVUE-370 IOPAMIDOL (ISOVUE-370) INJECTION 76% COMPARISON:  12/30/2016 chest CT angiogram. 07/10/2017 pelvis CT. 01/30/2018 chest radiograph. FINDINGS: CTA CHEST FINDINGS Cardiovascular: The study is moderate quality for the evaluation of pulmonary embolism, with some motion degradation limiting evaluation of the segmental and subsegmental vessels. No central or lobar pulmonary emboli. There are segmental and subsegmental pulmonary emboli in the left upper lobe (series 7/image 78), which appear acute/subacute. There is a thin web like filling defect within a segmental right upper lobe pulmonary artery branch (series 7/image 92). No additional pulmonary artery branch filling defects. Top-normal caliber main pulmonary artery (3.0 cm diameter). Mild-to-moderate cardiomegaly. No significant pericardial fluid/thickening. Left anterior descending and right coronary atherosclerosis. Mediastinum/Nodes: Stable goiter asymmetric to the left with heterogeneous thyroid parenchyma. Unremarkable esophagus. No axillary adenopathy. Mild right paratracheal adenopathy measuring up to the 1.1 cm (series 4/image 32), stable since 12/30/2016, most compatible with benign reactive adenopathy. No additional pathologically enlarged mediastinal nodes. No hilar adenopathy. Lungs/Pleura: No pneumothorax. Trace dependent bilateral pleural effusions.  Nonspecific soft tissue layering in bronchus intermedius and central right lower lobe airways. Subpleural 3 mm solid anterior left upper lobe pulmonary nodule (series 6/image 27), stable since 12/30/2016 CT, considered benign. Mild patchy consolidation, ground-glass opacity and volume loss throughout the bilateral dependent lungs. No new significant pulmonary nodules in the aerated portions of the lungs. Musculoskeletal: No aggressive appearing focal osseous lesions. Moderate thoracic spondylosis. Review of the MIP images confirms the above findings. CT ABDOMEN and PELVIS FINDINGS Motion degraded scan, limiting assessment. Hepatobiliary: Normal liver with no liver mass. Normal gallbladder with no  radiopaque cholelithiasis. No biliary ductal dilatation. Pancreas: Normal, with no mass or duct dilation. Spleen: Normal size. No mass. Adrenals/Urinary Tract: Mild thickening of the adrenal glands bilaterally without discrete adrenal nodules. No hydronephrosis. Subcentimeter hypodense renal cortical lesions in both kidneys are too small to characterize and require no follow-up. Normal bladder. Stomach/Bowel: Normal non-distended stomach. Normal caliber small bowel. Diffuse haziness of the small bowel wall without definite small bowel wall thickening. No pneumatosis. Appendix not discretely visualized. No pericecal inflammatory changes. Prominent rectal stool distending the rectum up to 8.6 cm diameter with the suggestion of mild rectal wall thickening, without significant acute perirectal fat stranding or definite pneumatosis. Otherwise no colonic wall thickening. Vascular/Lymphatic: Atherosclerotic nonaneurysmal abdominal aorta. Patent portal, splenic, hepatic and renal veins. 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 No pathologically enlarged lymph nodes in the abdomen or pelvis. Reproductive: Grossly normal uterus.  No adnexal mass. Other: No  pneumoperitoneum, ascites or focal fluid collection. Musculoskeletal: No aggressive appearing focal osseous lesions. Marked lumbar spondylosis. Review of the MIP images confirms the above findings. IMPRESSION: 1. 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 2. Diffuse haziness of the wall of the small bowel without definite small bowel wall thickening or pneumatosis. Early bowel ischemia not excluded. No free air or abscess. 3. Acute/subacute pulmonary embolism in segmental/subsegmental left upper lobe pulmonary artery branches. Evidence of chronic pulmonary embolism in the right upper lobe. No central pulmonary embolism. Top-normal caliber main pulmonary artery. 4. Mild-to-moderate cardiomegaly. Trace dependent bilateral pleural effusions. 5. Dependent opacity with some volume loss throughout both lungs, favor atelectasis, difficult to exclude a component of aspiration or pneumonia. 6. Prominent rectal stool with mild rectal wall thickening, cannot exclude rectal fecal impaction or early stercoral colitis. 7. Aortic Atherosclerosis (ICD10-I70.0). Critical Value/emergent results were called by telephone at the time of interpretation on 01/31/2018 at 4:20 pm to Dr. Irene Pap , who verbally acknowledged these results. Electronically Signed   By: Ilona Sorrel M.D.   On: 01/31/2018 16:27   Ct Abdomen Pelvis W Contrast  Result Date: 01/31/2018 CLINICAL DATA:  Inpatient. Severe sepsis. Fever. Abdominal pain. Leukocytosis. Clinical concern for intra-abdominal abscess. EXAM: CT ANGIOGRAPHY CHEST CT ABDOMEN AND PELVIS WITH CONTRAST TECHNIQUE: Multidetector CT imaging of the chest was performed using the standard protocol during bolus administration of intravenous contrast. Multiplanar CT image reconstructions and MIPs were obtained to evaluate the vascular anatomy. Multidetector CT imaging of the abdomen and pelvis was performed using the standard  protocol during bolus administration of intravenous contrast. CONTRAST:  70mL ISOVUE-370 IOPAMIDOL (ISOVUE-370) INJECTION 76% COMPARISON:  12/30/2016 chest CT angiogram. 07/10/2017 pelvis CT. 01/30/2018 chest radiograph. FINDINGS: CTA CHEST FINDINGS Cardiovascular: The study is moderate quality for the evaluation of pulmonary embolism, with some motion degradation limiting evaluation of the segmental and subsegmental vessels. No central or lobar pulmonary emboli. There are segmental and subsegmental pulmonary emboli in the left upper lobe (series 7/image 78), which appear acute/subacute. There is a thin web like filling defect within a segmental right upper lobe pulmonary artery branch (series 7/image 92). No additional pulmonary artery branch filling defects. Top-normal caliber main pulmonary artery (3.0 cm diameter). Mild-to-moderate cardiomegaly. No significant pericardial fluid/thickening. Left anterior descending and right coronary atherosclerosis. Mediastinum/Nodes: Stable goiter asymmetric to the left with heterogeneous thyroid parenchyma. Unremarkable esophagus. No axillary adenopathy. Mild right paratracheal adenopathy measuring up to the 1.1 cm (series 4/image 32), stable  since 12/30/2016, most compatible with benign reactive adenopathy. No additional pathologically enlarged mediastinal nodes. No hilar adenopathy. Lungs/Pleura: No pneumothorax. Trace dependent bilateral pleural effusions. Nonspecific soft tissue layering in bronchus intermedius and central right lower lobe airways. Subpleural 3 mm solid anterior left upper lobe pulmonary nodule (series 6/image 27), stable since 12/30/2016 CT, considered benign. Mild patchy consolidation, ground-glass opacity and volume loss throughout the bilateral dependent lungs. No new significant pulmonary nodules in the aerated portions of the lungs. Musculoskeletal: No aggressive appearing focal osseous lesions. Moderate thoracic spondylosis. Review of the MIP images  confirms the above findings. CT ABDOMEN and PELVIS FINDINGS Motion degraded scan, limiting assessment. Hepatobiliary: Normal liver with no liver mass. Normal gallbladder with no radiopaque cholelithiasis. No biliary ductal dilatation. Pancreas: Normal, with no mass or duct dilation. Spleen: Normal size. No mass. Adrenals/Urinary Tract: Mild thickening of the adrenal glands bilaterally without discrete adrenal nodules. No hydronephrosis. Subcentimeter hypodense renal cortical lesions in both kidneys are too small to characterize and require no follow-up. Normal bladder. Stomach/Bowel: Normal non-distended stomach. Normal caliber small bowel. Diffuse haziness of the small bowel wall without definite small bowel wall thickening. No pneumatosis. Appendix not discretely visualized. No pericecal inflammatory changes. Prominent rectal stool distending the rectum up to 8.6 cm diameter with the suggestion of mild rectal wall thickening, without significant acute perirectal fat stranding or definite pneumatosis. Otherwise no colonic wall thickening. Vascular/Lymphatic: Atherosclerotic nonaneurysmal abdominal aorta. Patent portal, splenic, hepatic and renal veins. 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 No pathologically enlarged lymph nodes in the abdomen or pelvis. Reproductive: Grossly normal uterus.  No adnexal mass. Other: No pneumoperitoneum, ascites or focal fluid collection. Musculoskeletal: No aggressive appearing focal osseous lesions. Marked lumbar spondylosis. Review of the MIP images confirms the above findings. IMPRESSION: 1. 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 2. Diffuse haziness of the wall of the small bowel without definite small bowel wall thickening or pneumatosis. Early bowel ischemia not excluded. No free air or abscess. 3. Acute/subacute pulmonary  embolism in segmental/subsegmental left upper lobe pulmonary artery branches. Evidence of chronic pulmonary embolism in the right upper lobe. No central pulmonary embolism. Top-normal caliber main pulmonary artery. 4. Mild-to-moderate cardiomegaly. Trace dependent bilateral pleural effusions. 5. Dependent opacity with some volume loss throughout both lungs, favor atelectasis, difficult to exclude a component of aspiration or pneumonia. 6. Prominent rectal stool with mild rectal wall thickening, cannot exclude rectal fecal impaction or early stercoral colitis. 7. Aortic Atherosclerosis (ICD10-I70.0). Critical Value/emergent results were called by telephone at the time of interpretation on 01/31/2018 at 4:20 pm to Dr. Irene Pap , who verbally acknowledged these results. Electronically Signed   By: Ilona Sorrel M.D.   On: 01/31/2018 16:27   Dg Chest Port 1 View  Result Date: 01/30/2018 CLINICAL DATA:  Diaphoresis, lethargy, history Alzheimer's, atrial fibrillation, CHF, stage III chronic renal insufficiency, diabetes mellitus, hypertension, recent surgery for coccygeal abscess EXAM: PORTABLE CHEST 1 VIEW COMPARISON:  Portable exam 1844 hours compared to 07/27/2017 FINDINGS: Enlargement of cardiac silhouette with pulmonary vascular congestion. Atherosclerotic calcification aorta. Patchy opacity at LEFT base could represent infiltrate or atelectasis in LEFT lower lobe. Remaining lungs grossly clear. Skin fold projects over LEFT upper chest. No pneumothorax or gross pleural effusion. Bones demineralized. IMPRESSION: Mild patchy LEFT basilar opacity question mild atelectasis or infiltrate in LEFT lower lobe. Electronically Signed   By: Lavonia Dana  M.D.   On: 01/30/2018 18:59    Cardiac Studies     Patient Profile     84 y.o. female admitted with lethargy   Hx of  CHF, dementia HTN, atrial fib   Admitted with Lethargy   Found to have Pulmonary emboli and occlusion of SMA  Assessment & Plan    1  Atrial  fibrillatoin   On 10 mg diltiazem  Rates fair  Follow     2  Chronic systolic CHF  WIll follow I/O   Volume status is not bad at present    3  Pulmonary emboli  Will need anticoagulatoion    4  SMA thrombosis. Reviewed note from procedure yesterday    Discussed with daughter importance of next 24 to 48 hours to guide care plans   Pts status remains critical. Prognosis poor WIth bowel ischemia, prognosis is poor Agree with plans to keep pt comfortable, not expand beyond current and may need to pull back if worsens.    For questions or updates, please contact [place] Please consult [email or web address] for contact info under Cardiology/STEMI.      Signed, Dorris Carnes, MD  02/01/2018, 7:47 AM

## 2018-02-01 NOTE — Consult Note (Signed)
[facility] Nurse wound consult note Reason for Consult:healing sacral pressure injury Wound type:pressure Pressure Injury POA: Yes Measurement:2cm x 2cm x 1.5cm Wound bed:100% pink non granulating Drainage (amount, consistency, odor) moderate serosanguinous  Periwound: macerated with rolled wound edges Dressing procedure/placement/frequency: Patient's family is present for my visit. They were unaware that consult was made again. The family takes care of the wound at home and while in hospital. A BID NS moistened dressing change was ordered yesterday by [place] Nurse at [facility] prior to transfer to [facility]. We will not follow, but will remain available to this patient, to nursing, and the medical and/or surgical teams.  Please re-consult if we need to assist further.  Fara Olden, RN-C, WTA-C, [place] [facility]ound Treatment Associate Ostomy Care Associate

## 2018-02-01 NOTE — Progress Notes (Signed)
Pharmacy Antibiotic Note  Anna [name] is a 84 y.o. female admitted on 01/30/2018 with pneumonia.  Pharmacy has been consulted for meropenem dosing.  Plan: Meropenem 1gm IV q12 hours F/u renal function, cultures and clinical course  Height: 5\' 5"  (165.1 cm) Weight: 138 lb 7.2 oz (62.8 kg) IBW/kg (Calculated) : 57  Temp (24hrs), Avg:97.3 F (36.3 C), Min:94.1 F (34.5 C), Max:98.5 F (36.9 C)  Recent Labs  Lab 01/30/18 1635 01/30/18 2028 01/31/18 0122 01/31/18 0656 01/31/18 1205 01/31/18 1435 01/31/18 1649  WBC 9.8  --   --   --  26.2*  --   --   CREATININE 1.18*  --   --   --  1.10*  --  1.00  LATICACIDVEN  --  4.75* 5.42* 6.3*  --  3.2* 3.2*    Estimated Creatinine Clearance: 35.7 mL/min (by C-G formula based on SCr of 1 mg/dL).    Allergies  Allergen Reactions  . Pollen Extract     Sneezing, watery eyes     Thank you for allowing pharmacy to be a part of this patient's care.  Excell Seltzer Poteet 02/01/2018 12:57 AM

## 2018-02-01 NOTE — Plan of Care (Signed)
Patient remains nonverbal, daughter at bedside who is POA and total care giver for patient at home.  Continue to turn and monitor patient.  Patient remains in A-Fib on cardizem gtt, monitoring Lactic Acid and Troponin at this time.  Merrem started for sepsis.

## 2018-02-01 NOTE — Progress Notes (Signed)
[place] for Heparin Indication:Pulmonary embolus & Mesenteric Artery thrombosis  Allergies  Allergen Reactions  . Pollen Extract     Sneezing, watery eyes   Patient Measurements: Height: 5\' 5"  (165.1 cm) Weight: 138 lb 7.2 oz (62.8 kg) IBW/kg (Calculated) : 57 Heparin Dosing Weight: 63.6 kg  Vital Signs: Temp: 98.2 F (36.8 C) (06/13 1213) Temp Source: Axillary (06/13 1213) BP: 134/82 (06/13 1000)  Labs: Recent Labs    01/30/18 1635 01/31/18 1205  01/31/18 1649 01/31/18 2205 02/01/18 0127 02/01/18 0636 02/01/18 1228  HGB 15.9* 15.7*  --   --  14.6  --   --  14.4  HCT 48.8* 48.5*  --   --  43.0  --   --  45.7  PLT 267 251  --   --   --   --   --  209  HEPARINUNFRC  --   --   --   --   --   --  0.55 0.55  CREATININE 1.18* 1.10*  --  1.00  --   --   --  1.23*  TROPONINI  --   --    < >  --   --  0.20* 0.25* 0.13*   < > = values in this interval not displayed.   Estimated Creatinine Clearance: 29 mL/min (A) (by C-G formula based on SCr of 1.23 mg/dL (H)).  Assessment:  84 yo Female with PE and mesenteric artery thrombosis for heparin. S/p OR w/ TPA but unsuccessful attempt at mechanical thrombectomy.  No po anticoagulation due to ischemic bowel -heparin level is confirmed at goal   Goal of Therapy:  Heparin level 0.3-0.7 units/ml Monitor platelets by anticoagulation protocol: Yes   Plan:  -no heparin changes needed -Daily heparin level and CBC  Hildred Laser, PharmD Clinical Pharmacist Clinical phone from 8:30-4:00 is [phone] After 4pm, please call [facility] [phone]) for assistance. 02/01/2018 2:21 PM

## 2018-02-01 NOTE — Progress Notes (Signed)
Spoke with patient's daughter who is medical POA. She says she wants patient to be DNR/DNI but is agreeable to BIPAP if needed. Explained patient's current status of sepsis from pneumonia and UTI and acute metabolic encephalopathy from the sepsis itself. She is currently minimally responsive to pain only but no hypercapnea on ABG. Will continue antibiotics as well as treatment of Afib and PE with heparin infusion. Will transfer to stepdown unit under hospitalist service. Have changed code status to partial code.

## 2018-02-01 NOTE — Progress Notes (Signed)
PROGRESS NOTE    Anna [name]  JKK:[id number] DOB: [birth year] DOA: 01/30/2018 PCP: Dorothyann Peng, NP  Brief Narrative: Anna [name] is a 84 year old chronically ill female with history of advanced dementia, chronic systolic CHF with EF of 71%, chronic atrial fibrillation not on anticoagulation, chronic sacral wound, diabetes mellitus was admitted on 6/11 with lethargy. -She was found to have acute and chronic pulmonary emboli, supportive mesenteric artery thrombosis, bilateral lower lobe pneumonia, atrial fibrillation with RVR. -Continues to remain unresponsive, seen by vascular surgery last p.m., was taken to the OR and had a unsuccessful attempt at mechanical thrombectomy, the thrombus was felt to be well organized suggesting a more chronic appearance.    Assessment & Plan:     Superior mesenteric artery thrombosis, - nearly occlusive thrombus, early bowel ischemia suggested based on CT yesterday -continue IV heparin, had an unsuccessful attempt at mechanical thrombectomy yesterday -She is not a surgical candidate -High risk of bowel infarction,  Abdominal exam: diffuse tenderness noted -Prognosis very poor, due to this and other serious comorbidities -Palliative medicine consult  Acute and chronic pulmonary emboli -CT angiogram 6/12 noted acute/subacute pulmonary embolism in the segmental and subsegmental left upper lobe pulmonary artery branches and evidence of chronic PE in right upper lobe -Continue heparin drip -She is at risk of bleeding especially from bowel ischemia, not appropriate to transition to alternative anticoagulation at this time  Aspiration pneumonia -currently on meropenem, will continue same -She is not alert enough for SLP evaluation -History well suggestive of long-standing dysphagia prior to admission  Metabolic encephalopathy -Multifactorial, due to aspiration pneumonia, possible bowel ischemia -Follow blood cultures, continue current  antibiotics -Nothing by mouth  Atrial fibrillation with RVR -Continue IV Cardizem -also on IV heparin for acute PE and SMA thrombosis  Advanced dementia -Daughter in significant denial -Mumbles yes and no at baseline, Mostly bedbound  Chronic systolic CHF -EF 69% -continue half-normal saline at 50 mL an hour now while nothing by mouth  Diabetes mellitus -CBG stable, continue sliding scale insulin -If she becomes hypoglycemic will change IV fluids to D5 half-normal saline  Global: continues to remain critically ill, unresponsive, high risk of decompensation/death -Daughter continues to 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, adult, palliative care consulted -Do not think she will survive this hospitalization  DVT prophylaxis: Heparin gtt Code Status: No Code Blue/No CPR/No intubation but ok with BIPAP Family Communication: Daughter at bedside Disposition Plan: remain in ICU  Consultants:   VVS  Cards  PCCM  Procedures: Procedure:   #1: Ultrasound-guided access, right femoral artery                         #2: Abdominal aortogram                         #3: First order catheterization, superior mesenteric artery                         #4: Superior mesenteric artery angiogram                         #5: Catheter directed administration of TPA into the superior mesenteric artery                         #6: Mechanical thrombectomy, superior mesenteric artery Findings: The thrombus was well organized, suggesting a  more chronic appearance.  Unsuccessful attempt at mechanical thrombectomy     Antimicrobials:    Subjective: -remains unresponsive, no other significant events overnight  Objective: Vitals:   02/01/18 0800 02/01/18 0900 02/01/18 1000 02/01/18 1213  BP: 125/68 (!) 121/50 134/82   Pulse:      Resp: (!) 33 (!) 29 (!) 31   Temp:    98.2 F (36.8 C)  TempSrc:    Axillary  SpO2: 98% 100% 100%   Weight:      Height:        Intake/Output Summary  (Last 24 hours) at 02/01/2018 1250 Last data filed at 02/01/2018 0800 Gross per 24 hour  Intake 2580.53 ml  Output 150 ml  Net 2430.53 ml   Filed Weights   01/31/18 0630 01/31/18 2300  Weight: 63.6 kg (140 lb 3.4 oz) 62.8 kg (138 lb 7.2 oz)    Examination:  General exam: unresponsive, winces to pain only, elderly chronically ill appearing  Respiratory system: Ronchi at B bases Cardiovascular system: S1 & S2 heard, RRR.  Gastrointestinal system: Abdomen is  Mildly distended, tender diffusely, BS decreased Central nervous system: obtunded, withdraws to painful stimuli Extremities: trace edema Skin: sacral wound quarter sized Psychiatry:unable to assess   Data Reviewed:   CBC: Recent Labs  Lab 01/30/18 1635 01/31/18 1205 01/31/18 2205  WBC 9.8 26.2*  --   HGB 15.9* 15.7* 14.6  HCT 48.8* 48.5* 43.0  MCV 94.8 94.4  --   PLT 267 251  --    Basic Metabolic Panel: Recent Labs  Lab 01/30/18 1635 01/31/18 1205 01/31/18 1649 01/31/18 2205  NA 145 149* 143 149*  K 3.6 3.8 3.3* 2.8*  CL 107 116* 115*  --   CO2 26 18* 19*  --   GLUCOSE 225* 361* 294* 197*  BUN 34* 34* 33*  --   CREATININE 1.18* 1.10* 1.00  --   CALCIUM 10.3 9.7 9.1  --    GFR: Estimated Creatinine Clearance: 35.7 mL/min (by C-G formula based on SCr of 1 mg/dL). Liver Function Tests: Recent Labs  Lab 01/30/18 1649  AST 25  ALT 13*  ALKPHOS 82  BILITOT 0.8  PROT 7.3  ALBUMIN 4.0   No results for input(s): LIPASE, AMYLASE in the last 168 hours. No results for input(s): AMMONIA in the last 168 hours. Coagulation Profile: No results for input(s): INR, PROTIME in the last 168 hours. Cardiac Enzymes: Recent Labs  Lab 01/31/18 1418 02/01/18 0127 02/01/18 0636  TROPONINI 0.21* 0.20* 0.25*   BNP (last 3 results) No results for input(s): PROBNP in the last 8760 hours. HbA1C: Recent Labs    01/31/18 1158  HGBA1C 6.0*   CBG: Recent Labs  Lab 01/31/18 1618 01/31/18 2343 02/01/18 0426  02/01/18 0744 02/01/18 1216  GLUCAP 256* 158* 107* 150* 159*   Lipid Profile: No results for input(s): CHOL, HDL, LDLCALC, TRIG, CHOLHDL, LDLDIRECT in the last 72 hours. Thyroid Function Tests: Recent Labs    01/31/18 1158  TSH 0.206*   Anemia Panel: No results for input(s): VITAMINB12, FOLATE, FERRITIN, TIBC, IRON, RETICCTPCT in the last 72 hours. Urine analysis:    Component Value Date/Time   COLORURINE YELLOW 01/30/2018 2009   APPEARANCEUR CLEAR 01/30/2018 2009   LABSPEC 1.016 01/30/2018 2009   PHURINE 5.0 01/30/2018 2009   GLUCOSEU NEGATIVE 01/30/2018 2009   HGBUR NEGATIVE 01/30/2018 2009   BILIRUBINUR NEGATIVE 01/30/2018 2009   BILIRUBINUR Negative 02/11/2017 1234   KETONESUR 5 (A) 01/30/2018 2009  PROTEINUR NEGATIVE 01/30/2018 2009   UROBILINOGEN 0.2 02/11/2017 1234   UROBILINOGEN 0.2 01/27/2015 1353   NITRITE NEGATIVE 01/30/2018 2009   LEUKOCYTESUR LARGE (A) 01/30/2018 2009   Sepsis Labs: @LABRCNTIP (procalcitonin:4,lacticidven:4)  ) Recent Results (from the past 240 hour(s))  Urine culture     Status: Abnormal (Preliminary result)   Collection Time: 01/30/18  8:32 PM  Result Value Ref Range Status   Specimen Description   Final    URINE, RANDOM Performed at [facility] [address], [place], [place] [address]    Special Requests   Final    NONE Performed at [facility], [place] [address] [place], [place] [address]    Culture (A)  Final    40,000 COLONIES/mL KLEBSIELLA PNEUMONIAE 80,000 COLONIES/mL LACTOBACILLUS SPECIES Standardized susceptibility testing for this organism is not available. Performed at [facility], [place] [address], [place], [place] [address]    Report Status PENDING  Incomplete  MRSA PCR Screening     Status: None   Collection Time: 01/31/18  6:48 AM  Result Value Ref Range Status   MRSA by PCR NEGATIVE NEGATIVE Final    Comment:        The GeneXpert MRSA Assay (FDA approved for NASAL  specimens only), is one component of a comprehensive MRSA colonization surveillance program. It is not intended to diagnose MRSA infection nor to guide or monitor treatment for MRSA infections. Performed at [facility], [place] [address] [place], [place] [address]          Radiology Studies: Ct Head Wo Contrast  Result Date: 01/30/2018 CLINICAL DATA:  Altered mental status, altered level of consciousness today, less responsive, history Alzheimer's, atrial fibrillation, bronchitis, CHF, stage III chronic renal disease, hypertension EXAM: CT HEAD WITHOUT CONTRAST TECHNIQUE: Contiguous axial images were obtained from the base of the skull through the vertex without intravenous contrast. Sagittal and coronal MPR images reconstructed from axial data set. COMPARISON:  12/30/2016 FINDINGS: Brain: Generalized atrophy. Normal ventricular morphology. No midline shift or mass effect. Minimal vessel chronic ischemic changes of deep cerebral white matter. No intracranial hemorrhage, mass lesion, evidence of acute infarction, or extra-axial fluid collection. Vascular: No hyperdense vessels. Minimal atherosclerotic calcification of internal carotid arteries at skull base. Skull: Intact Sinuses/Orbits: Clear Other: N/A IMPRESSION: Atrophy with minimal small vessel chronic ischemic changes of deep cerebral white matter. No acute intracranial abnormalities. Electronically Signed   By: Lavonia Dana M.D.   On: 01/30/2018 19:08   Ct Angio Chest Pe W Or Wo Contrast  Result Date: 01/31/2018 CLINICAL DATA:  Inpatient. Severe sepsis. Fever. Abdominal pain. Leukocytosis. Clinical concern for intra-abdominal abscess. EXAM: CT ANGIOGRAPHY CHEST CT ABDOMEN AND PELVIS WITH CONTRAST TECHNIQUE: Multidetector CT imaging of the chest was performed using the standard protocol during bolus administration of intravenous contrast. Multiplanar CT image reconstructions and MIPs were obtained to evaluate the vascular  anatomy. Multidetector CT imaging of the abdomen and pelvis was performed using the standard protocol during bolus administration of intravenous contrast. CONTRAST:  55mL ISOVUE-370 IOPAMIDOL (ISOVUE-370) INJECTION 76% COMPARISON:  12/30/2016 chest CT angiogram. 07/10/2017 pelvis CT. 01/30/2018 chest radiograph. FINDINGS: CTA CHEST FINDINGS Cardiovascular: The study is moderate quality for the evaluation of pulmonary embolism, with some motion degradation limiting evaluation of the segmental and subsegmental vessels. No central or lobar pulmonary emboli. There are segmental and subsegmental pulmonary emboli in the left upper lobe (series 7/image 78), which appear acute/subacute. There is a thin web like filling defect  within a segmental right upper lobe pulmonary artery branch (series 7/image 92). No additional pulmonary artery branch filling defects. Top-normal caliber main pulmonary artery (3.0 cm diameter). Mild-to-moderate cardiomegaly. No significant pericardial fluid/thickening. Left anterior descending and right coronary atherosclerosis. Mediastinum/Nodes: Stable goiter asymmetric to the left with heterogeneous thyroid parenchyma. Unremarkable esophagus. No axillary adenopathy. Mild right paratracheal adenopathy measuring up to the 1.1 cm (series 4/image 32), stable since 12/30/2016, most compatible with benign reactive adenopathy. No additional pathologically enlarged mediastinal nodes. No hilar adenopathy. Lungs/Pleura: No pneumothorax. Trace dependent bilateral pleural effusions. Nonspecific soft tissue layering in bronchus intermedius and central right lower lobe airways. Subpleural 3 mm solid anterior left upper lobe pulmonary nodule (series 6/image 27), stable since 12/30/2016 CT, considered benign. Mild patchy consolidation, ground-glass opacity and volume loss throughout the bilateral dependent lungs. No new significant pulmonary nodules in the aerated portions of the lungs. Musculoskeletal: No  aggressive appearing focal osseous lesions. Moderate thoracic spondylosis. Review of the MIP images confirms the above findings. CT ABDOMEN and PELVIS FINDINGS Motion degraded scan, limiting assessment. Hepatobiliary: Normal liver with no liver mass. Normal gallbladder with no radiopaque cholelithiasis. No biliary ductal dilatation. Pancreas: Normal, with no mass or duct dilation. Spleen: Normal size. No mass. Adrenals/Urinary Tract: Mild thickening of the adrenal glands bilaterally without discrete adrenal nodules. No hydronephrosis. Subcentimeter hypodense renal cortical lesions in both kidneys are too small to characterize and require no follow-up. Normal bladder. Stomach/Bowel: Normal non-distended stomach. Normal caliber small bowel. Diffuse haziness of the small bowel wall without definite small bowel wall thickening. No pneumatosis. Appendix not discretely visualized. No pericecal inflammatory changes. Prominent rectal stool distending the rectum up to 8.6 cm diameter with the suggestion of mild rectal wall thickening, without significant acute perirectal fat stranding or definite pneumatosis. Otherwise no colonic wall thickening. Vascular/Lymphatic: Atherosclerotic nonaneurysmal abdominal aorta. Patent portal, splenic, hepatic and renal veins. 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 No pathologically enlarged lymph nodes in the abdomen or pelvis. Reproductive: Grossly normal uterus.  No adnexal mass. Other: No pneumoperitoneum, ascites or focal fluid collection. Musculoskeletal: No aggressive appearing focal osseous lesions. Marked lumbar spondylosis. Review of the MIP images confirms the above findings. IMPRESSION: 1. 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 2. Diffuse haziness of the wall of the small bowel without definite small bowel wall thickening or  pneumatosis. Early bowel ischemia not excluded. No free air or abscess. 3. Acute/subacute pulmonary embolism in segmental/subsegmental left upper lobe pulmonary artery branches. Evidence of chronic pulmonary embolism in the right upper lobe. No central pulmonary embolism. Top-normal caliber main pulmonary artery. 4. Mild-to-moderate cardiomegaly. Trace dependent bilateral pleural effusions. 5. Dependent opacity with some volume loss throughout both lungs, favor atelectasis, difficult to exclude a component of aspiration or pneumonia. 6. Prominent rectal stool with mild rectal wall thickening, cannot exclude rectal fecal impaction or early stercoral colitis. 7. Aortic Atherosclerosis (ICD10-I70.0). Critical Value/emergent results were called by telephone at the time of interpretation on 01/31/2018 at 4:20 pm to Dr. Irene Pap , who verbally acknowledged these results. Electronically Signed   By: Ilona Sorrel M.D.   On: 01/31/2018 16:27   Ct Abdomen Pelvis W Contrast  Result Date: 01/31/2018 CLINICAL DATA:  Inpatient. Severe sepsis. Fever. Abdominal pain. Leukocytosis. Clinical concern for intra-abdominal abscess. EXAM: CT ANGIOGRAPHY CHEST CT ABDOMEN AND PELVIS WITH CONTRAST TECHNIQUE: Multidetector CT imaging of the chest was performed using  the standard protocol during bolus administration of intravenous contrast. Multiplanar CT image reconstructions and MIPs were obtained to evaluate the vascular anatomy. Multidetector CT imaging of the abdomen and pelvis was performed using the standard protocol during bolus administration of intravenous contrast. CONTRAST:  63mL ISOVUE-370 IOPAMIDOL (ISOVUE-370) INJECTION 76% COMPARISON:  12/30/2016 chest CT angiogram. 07/10/2017 pelvis CT. 01/30/2018 chest radiograph. FINDINGS: CTA CHEST FINDINGS Cardiovascular: The study is moderate quality for the evaluation of pulmonary embolism, with some motion degradation limiting evaluation of the segmental and subsegmental vessels.  No central or lobar pulmonary emboli. There are segmental and subsegmental pulmonary emboli in the left upper lobe (series 7/image 78), which appear acute/subacute. There is a thin web like filling defect within a segmental right upper lobe pulmonary artery branch (series 7/image 92). No additional pulmonary artery branch filling defects. Top-normal caliber main pulmonary artery (3.0 cm diameter). Mild-to-moderate cardiomegaly. No significant pericardial fluid/thickening. Left anterior descending and right coronary atherosclerosis. Mediastinum/Nodes: Stable goiter asymmetric to the left with heterogeneous thyroid parenchyma. Unremarkable esophagus. No axillary adenopathy. Mild right paratracheal adenopathy measuring up to the 1.1 cm (series 4/image 32), stable since 12/30/2016, most compatible with benign reactive adenopathy. No additional pathologically enlarged mediastinal nodes. No hilar adenopathy. Lungs/Pleura: No pneumothorax. Trace dependent bilateral pleural effusions. Nonspecific soft tissue layering in bronchus intermedius and central right lower lobe airways. Subpleural 3 mm solid anterior left upper lobe pulmonary nodule (series 6/image 27), stable since 12/30/2016 CT, considered benign. Mild patchy consolidation, ground-glass opacity and volume loss throughout the bilateral dependent lungs. No new significant pulmonary nodules in the aerated portions of the lungs. Musculoskeletal: No aggressive appearing focal osseous lesions. Moderate thoracic spondylosis. Review of the MIP images confirms the above findings. CT ABDOMEN and PELVIS FINDINGS Motion degraded scan, limiting assessment. Hepatobiliary: Normal liver with no liver mass. Normal gallbladder with no radiopaque cholelithiasis. No biliary ductal dilatation. Pancreas: Normal, with no mass or duct dilation. Spleen: Normal size. No mass. Adrenals/Urinary Tract: Mild thickening of the adrenal glands bilaterally without discrete adrenal nodules. No  hydronephrosis. Subcentimeter hypodense renal cortical lesions in both kidneys are too small to characterize and require no follow-up. Normal bladder. Stomach/Bowel: Normal non-distended stomach. Normal caliber small bowel. Diffuse haziness of the small bowel wall without definite small bowel wall thickening. No pneumatosis. Appendix not discretely visualized. No pericecal inflammatory changes. Prominent rectal stool distending the rectum up to 8.6 cm diameter with the suggestion of mild rectal wall thickening, without significant acute perirectal fat stranding or definite pneumatosis. Otherwise no colonic wall thickening. Vascular/Lymphatic: Atherosclerotic nonaneurysmal abdominal aorta. Patent portal, splenic, hepatic and renal veins. 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 No pathologically enlarged lymph nodes in the abdomen or pelvis. Reproductive: Grossly normal uterus.  No adnexal mass. Other: No pneumoperitoneum, ascites or focal fluid collection. Musculoskeletal: No aggressive appearing focal osseous lesions. Marked lumbar spondylosis. Review of the MIP images confirms the above findings. IMPRESSION: 1. 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 2. Diffuse haziness of the wall of the small bowel without definite small bowel wall thickening or pneumatosis. Early bowel ischemia not excluded. No free air or abscess. 3. Acute/subacute pulmonary embolism in segmental/subsegmental left upper lobe pulmonary artery branches. Evidence of chronic pulmonary embolism in the right upper lobe. No central pulmonary embolism. Top-normal caliber main pulmonary artery. 4. Mild-to-moderate cardiomegaly. Trace dependent bilateral pleural effusions. 5. Dependent opacity with some volume loss throughout  both lungs, favor atelectasis, difficult to exclude a component of aspiration or  pneumonia. 6. Prominent rectal stool with mild rectal wall thickening, cannot exclude rectal fecal impaction or early stercoral colitis. 7. Aortic Atherosclerosis (ICD10-I70.0). Critical Value/emergent results were called by telephone at the time of interpretation on 01/31/2018 at 4:20 pm to Dr. Irene Pap , who verbally acknowledged these results. Electronically Signed   By: Ilona Sorrel M.D.   On: 01/31/2018 16:27   Dg Chest Port 1 View  Result Date: 01/30/2018 CLINICAL DATA:  Diaphoresis, lethargy, history Alzheimer's, atrial fibrillation, CHF, stage III chronic renal insufficiency, diabetes mellitus, hypertension, recent surgery for coccygeal abscess EXAM: PORTABLE CHEST 1 VIEW COMPARISON:  Portable exam 1844 hours compared to 07/27/2017 FINDINGS: Enlargement of cardiac silhouette with pulmonary vascular congestion. Atherosclerotic calcification aorta. Patchy opacity at LEFT base could represent infiltrate or atelectasis in LEFT lower lobe. Remaining lungs grossly clear. Skin fold projects over LEFT upper chest. No pneumothorax or gross pleural effusion. Bones demineralized. IMPRESSION: Mild patchy LEFT basilar opacity question mild atelectasis or infiltrate in LEFT lower lobe. Electronically Signed   By: Lavonia Dana M.D.   On: 01/30/2018 18:59        Scheduled Meds: . chlorhexidine  15 mL Mouth Rinse BID  . insulin aspart  0-15 Units Subcutaneous Q4H  . mouth rinse  15 mL Mouth Rinse BID  . mouth rinse  15 mL Mouth Rinse q12n4p   Continuous Infusions: . sodium chloride 50 mL/hr at 02/01/18 0800  . diltiazem (CARDIZEM) infusion 10 mg/hr (02/01/18 0901)  . heparin 1,000 Units/hr (02/01/18 0800)  . meropenem (MERREM) IV 1 g (02/01/18 0902)     LOS: 2 days    Time spent: 90min    Domenic Polite, MD [facility] Page via [email or web address], password [email or web address] After 7PM please contact night-coverage  02/01/2018, 12:50 PM

## 2018-02-01 NOTE — Progress Notes (Signed)
    Subjective  - POD #1  No acute overnight events Now limited DNR  Physical Exam:  Remains non-verbal Abdomen soft and does not appear to be tender Right groin soft without hematoma       Assessment/Plan:    No surgical options to improve blood flow to intestines.  Patient is not a candidate for abdominal exploration.  She needs supportive care.  Hospice to see today  Wells Devontae Casasola 02/01/2018 7:51 AM --  Vitals:   02/01/18 0700 02/01/18 0742  BP: (!) 108/54 (!) 108/54  Pulse:    Resp: (!) 43   Temp:  98.7 F (37.1 C)  SpO2: 96%     Intake/Output Summary (Last 24 hours) at 02/01/2018 0751 Last data filed at 02/01/2018 0700 Gross per 24 hour  Intake 2585.53 ml  Output 150 ml  Net 2435.53 ml     Laboratory CBC    Component Value Date/Time   WBC 26.2 (H) 01/31/2018 1205   HGB 14.6 01/31/2018 2205   HCT 43.0 01/31/2018 2205   PLT 251 01/31/2018 1205    BMET    Component Value Date/Time   NA 149 (H) 01/31/2018 2205   NA 142 07/29/2017   K 2.8 (L) 01/31/2018 2205   CL 115 (H) 01/31/2018 1649   CO2 19 (L) 01/31/2018 1649   GLUCOSE 197 (H) 01/31/2018 2205   BUN 33 (H) 01/31/2018 1649   BUN 17 07/29/2017   CREATININE 1.00 01/31/2018 1649   CREATININE 1.25 (H) 09/27/2016 1201   CALCIUM 9.1 01/31/2018 1649   GFRNONAA 49 (L) 01/31/2018 1649   GFRAA 57 (L) 01/31/2018 1649    COAG Lab Results  Component Value Date   INR 1.13 07/10/2017   INR 1.18 02/06/2016   INR 1.10 02/05/2016   No results found for: PTT  Antibiotics Anti-infectives (From admission, onward)   Start     Dose/Rate Route Frequency Ordered Stop   02/01/18 0015  meropenem (MERREM) 1 g in sodium chloride 0.9 % 100 mL IVPB     1 g 200 mL/hr over 30 Minutes Intravenous Every 12 hours 02/01/18 0005     01/31/18 1800  azithromycin (ZITHROMAX) 500 mg in sodium chloride 0.9 % 250 mL IVPB  Status:  Discontinued     500 mg 250 mL/hr over 60 Minutes Intravenous Every 24 hours 01/31/18  0559 01/31/18 2356   01/31/18 0800  piperacillin-tazobactam (ZOSYN) IVPB 3.375 g  Status:  Discontinued     3.375 g 12.5 mL/hr over 240 Minutes Intravenous Every 8 hours 01/31/18 0543 01/31/18 2356   01/31/18 0045  Ampicillin-Sulbactam (UNASYN) 3 g in sodium chloride 0.9 % 100 mL IVPB  Status:  Discontinued     3 g 200 mL/hr over 30 Minutes Intravenous Every 6 hours 01/31/18 0032 01/31/18 0531   01/30/18 2200  azithromycin (ZITHROMAX) 500 mg in sodium chloride 0.9 % 250 mL IVPB     500 mg 250 mL/hr over 60 Minutes Intravenous  Once 01/30/18 2155 01/31/18 0048   01/30/18 2045  cefTRIAXone (ROCEPHIN) 1 g in sodium chloride 0.9 % 100 mL IVPB     1 g Intravenous  Once 01/30/18 2031 01/30/18 2347       V. Leia Alf, M.D. [facility] Office: [phone] Pager:  [phone]

## 2018-02-01 NOTE — Progress Notes (Signed)
Speech Language Pathology Discharge Patient Details Name: Anna [name] MRN: [id number] DOB: March 24, 1929 Today's Date: 02/01/2018 Time:  -     Patient discharged from SLP services secondary to medical decline - will need to re-order SLP to resume therapy services. Per chart and RN, pt is minimally responsive and not able to take po's. If condition improves and ST is needed for swallow, please reorder.   Please see latest therapy progress note for current level of functioning and progress toward goals.    Progress and discharge plan discussed with patient and/or caregiver: Patient unable to participate in discharge planning and no caregivers available  GO     [name] 02/01/2018, 8:51 AM   [name] Caroli.Ed [employer] [phone]

## 2018-02-01 NOTE — Progress Notes (Signed)
  Echocardiogram 2D Echocardiogram has been performed.  Anna [name] 02/01/2018, 10:10 AM

## 2018-02-01 NOTE — Consult Note (Signed)
Consultation Note Date: 02/01/2018   Patient Name: Anna [name]  DOB: Jun 28, 1929  MRN: [id number]  Age / Sex: 84 y.o., female  PCP: Dorothyann Peng, NP Referring Physician: Serafina Mitchell, MD  Reason for Consultation: Establishing goals of care and Psychosocial/spiritual support  HPI/Patient Profile: 84 y.o. female   admitted on 01/30/2018 with past medical history significant for Alzhemiers dementia, systolic and diastolic CHF, DM2, atrial fibrillation, was brought to the ED via EMS with reports of diaphoresis with clamminess and lethargy.    She was found to have acute on chronic pulmonary emboli,  SMA  thrombosis, bilateral lower lobe pneumonia, atrial fibrillation with RVR.  She was taken to the OR and had a unsuccessful attempt at mechanical thrombectomy, the thrombus was felt to be well organized suggesting a more chronic appearance.  2/2 to her multiple co-morbidites and advanced age patient has limited treatment options per vascular surgery.  Family has made decision to continue current medical  management with IV heparin and antibiotics and see if the patient can resolve this on her own.  The family wants to do everything possible, "at least for another 24-48 hrs".  "We have to give this more time"    Daughter tells me she understands the seriousness of the situation and the reality that her mother may not survive this hospitalization.  She is cared for at home by her daughter [name] who the  HPOA.  Family face treatment option decisions, advanced directive decisions and anticipatory care needs  Clinical Assessment and Goals of Care:   This NP Wadie Lessen reviewed medical records, received report from team, assessed the patient and then meet at the patient's bedside along with her daughter/ [name] and SIL to discuss diagnosis, prognosis, GOC, EOL wishes disposition and options.  Concept of  Hospice and Palliative Care were discussed  Most of today's conversation centered around daughter/main caregiver detailing her meticulous care of her mother over the past three years.  She voices "how guilty" she feels that she was unable to keep her mother out of the hospital. "I must have missed something"  Emotional support offered.  We discussed the difficulty and complexity of being a full time caregiver  We discussed concept of human mortality and natural life cycle as a person approaches old age.  A discussion detailing the  difference between a aggressive medical intervention path  and a palliative comfort care path for this patient at this time was had.   Natural trajectory and expectations at EOL were discussed.  Questions and concerns addressed.     Daughter requests a f/u visit from PMT on Saturday when her brother comes to town from [place] .  I have made the team aware of this request and daughter plans to call team phone on Saturday morning to make an appointment  PMT will continue to support holistically.  HCPOA    SUMMARY OF RECOMMENDATIONS    Code Status/Advance Care Planning:  Limited code   Additional Recommendations (Limitations, Scope, Preferences):  Full Scope  Treatment  Psycho-social/Spiritual:   Desire for further Chaplaincy support:no-strong community church support/[employer]    Prognosis:   Likely hours to days, patient is currently unresponsvie  Discharge Planning: To Be Determined      Primary Diagnoses: Present on Admission: . Pneumonia . Benign essential HTN . DM (diabetes mellitus) type II controlled with renal manifestation ([place]) . Alzheimer's dementia with behavioral disturbance . Atrial fibrillation with RVR ([place]) . Acute on chronic diastolic heart failure ([place]) . Acute metabolic encephalopathy . Lower urinary tract infectious disease . 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 The following aspects are pertinent.  Past Medical History:  Diagnosis Date  . Alzheimer's disease   . Atrial fibrillation ([place])   . Bronchitis   . CHF (congestive heart failure) ([place])   . Chronic renal insufficiency, stage III (moderate) ([facility])   . Diabetes mellitus without complication ([place])   . Dyspnea    with exertion  . GERD (gastroesophageal reflux disease)   . GI bleeding 11/2013   due to supratherapeutic INR  . Hyperlipidemia   . Hypertension   . Hyperthyroidism    on amiodarone  . LBBB (left bundle branch block)   . Overactive bladder   . Overweight   . Persistent atrial fibrillation ([place])   . Pneumonia   . Thyroid mass    LLL  . UTI (urinary tract infection)   . Vitamin B12 deficiency   . Vitamin D deficiency    Social History   Socioeconomic History  . Marital status: Divorced    Spouse name: Not on file  . Number of children: Not on file  . Years of education: Not on file  . Highest education level: Not on file  Occupational History  . Not on file  Social Needs  . Financial resource strain: Not very hard  . Food insecurity:    Worry: Never true    Inability: Never true  . Transportation needs:    Medical: No    Non-medical: No  Tobacco Use  . Smoking status: Never Smoker  . Smokeless tobacco: Never Used  Substance and Sexual Activity  . Alcohol use: No    Alcohol/week: 0.0 oz  . Drug use: No  . Sexual activity: Not on file  Lifestyle  . Physical activity:    Days per week: 0 days    Minutes per session: 0 min  . Stress: Only a little  Relationships  . Social connections:    Talks on phone: Patient refused    Gets together: Patient refused    Attends religious service: Patient refused    Active member of club or organization: No    Attends meetings of clubs or organizations: Never    Relationship status: Patient refused  Other Topics Concern  . Not on file  Social History  Narrative   Pt recently moved to [place] with daughter from [place]   Family History  Problem Relation Age of Onset  . Heart disease Father   . Diabetes Mother   . Thyroid disease Neg Hx    Scheduled Meds: . chlorhexidine  15 mL Mouth Rinse BID  . insulin aspart  0-15 Units Subcutaneous Q4H  . mouth rinse  15 mL Mouth Rinse BID  . mouth rinse  15 mL Mouth Rinse q12n4p   Continuous Infusions: . sodium chloride 50 mL/hr at 02/01/18 0700  . diltiazem (CARDIZEM) infusion 10 mg/hr (02/01/18 0901)  .  heparin 1,000 Units/hr (02/01/18 0700)  . meropenem (MERREM) IV 1 g (02/01/18 0902)   PRN Meds:.acetaminophen, bisacodyl, metoprolol tartrate, ondansetron **OR** ondansetron (ZOFRAN) IV, perflutren lipid microspheres (DEFINITY) IV suspension Medications Prior to Admission:  Prior to Admission medications   Medication Sig Start Date End Date Taking? Authorizing Provider  aspirin 325 MG tablet Take 325 mg by mouth daily.   Yes [provider]  calcitRIOL (ROCALTROL) 0.5 MCG capsule TAKE 1 CAPSULE ON MONDAY, WEDNESDAY, AND FRIDAY OF EACH WEEK. 01/16/18  Yes Nafziger, Tommi Rumps, NP  carbamide peroxide (DEBROX) 6.5 % OTIC solution Place 5 drops into both ears 2 (two) times daily. Patient taking differently: Place 5 drops 2 (two) times daily as needed into both ears (irritation).  03/22/17  Yes Nafziger, Tommi Rumps, NP  cholecalciferol (VITAMIN D) 1000 units tablet Take 1,000 Units by mouth daily.   Yes [provider]  donepezil (ARICEPT) 10 MG tablet TAKE 1 TABLET ONCE DAILY. 01/17/17  Yes Nafziger, Tommi Rumps, NP  furosemide (LASIX) 20 MG tablet TAKE 1&1/2 TABLETS DAILY. TAKE AN EXTRA 1/2 TABLET DAILY AS NEEDED. 01/11/18  Yes Skeet Latch, MD  glimepiride (AMARYL) 2 MG tablet TAKE 1 TABLET ONCE DAILY. Patient taking differently: No sig reported 04/19/17  Yes Nafziger, Tommi Rumps, NP  guaiFENesin (MUCINEX PO) Take 1 tablet by mouth daily as needed (chest congestion).   Yes [provider]    HYDROcodone-acetaminophen (NORCO) 5-325 MG tablet Take 0.5 tablets by mouth every 8 (eight) hours as needed for moderate pain. 01/30/18 03/01/18 Yes Nafziger, Tommi Rumps, NP  loratadine (ALAVERT) 10 MG tablet Take 5 mg daily by mouth.    Yes [provider]  memantine (NAMENDA) 5 MG tablet TAKE ONE TABLET AT BEDTIME. 12/19/17  Yes Nafziger, Tommi Rumps, NP  metoprolol tartrate (LOPRESSOR) 25 MG tablet Take 1 tablet (25 mg total) by mouth 2 (two) times daily. 07/16/17  Yes Hosie Poisson, MD  Phenazopyridine HCl (AZO TABS PO) Take 1 tablet by mouth 2 (two) times daily.   Yes [provider]  Polyethyl Glycol-Propyl Glycol (SYSTANE) 0.4-0.3 % SOLN Apply 1 drop to eye 2 (two) times daily as needed (dry eyes).   Yes [provider]  polyethylene glycol (MIRALAX / GLYCOLAX) packet Take 17 g by mouth daily as needed for mild constipation, moderate constipation or severe constipation.   Yes [provider]  potassium chloride SA (K-DUR,KLOR-CON) 20 MEQ tablet TAKE 1 TABLET ONCE DAILY. 10/24/17  Yes Skeet Latch, MD  saccharomyces boulardii (FLORASTOR) 250 MG capsule Take 250 mg by mouth 2 (two) times daily.   Yes [provider]  vitamin B-12 (CYANOCOBALAMIN) 500 MCG tablet Take 500 mcg by mouth daily.   Yes [provider]  ACCU-CHEK AVIVA PLUS test strip PATIENT TO CHECK SUGARS ONCE DAILY. 11/19/15   [provider]  donepezil (ARICEPT) 10 MG tablet TAKE 1 TABLET ONCE DAILY. Patient not taking: Reported on 01/30/2018 01/18/18   Dorothyann Peng, NP  fluconazole (DIFLUCAN) 100 MG tablet Take 1 tablet (100 mg total) by mouth daily. Patient not taking: Reported on 01/30/2018 08/07/17   Campbell Riches, MD  Iron-FA-B Cmp-C-Biot-Probiotic (FUSION PLUS) CAPS Take 1 tablet by mouth daily. Patient not taking: Reported on 01/30/2018 01/13/17   Dorothyann Peng, NP  omeprazole (PRILOSEC) 40 MG capsule Take 30- 60 min before your first and last meals of the day Patient  not taking: Reported on 01/30/2018 01/05/17   Tanda Rockers, MD   Allergies  Allergen Reactions  . Pollen Extract  Sneezing, watery eyes   Review of Systems  Unable to perform ROS: Acuity of condition    Physical Exam  Constitutional: She appears well-developed. She appears ill.  Cardiovascular: Tachycardia present.  Pulmonary/Chest: Effort normal. She has decreased breath sounds.  Neurological: She is unresponsive.  Skin: Skin is warm and dry.    Vital Signs: BP (!) 108/54   Pulse 100   Temp 98.7 F (37.1 C) (Axillary)   Resp (!) 43   Ht 5\' 5"  (1.651 m)   Wt 62.8 kg (138 lb 7.2 oz)   SpO2 (!) 87%   BMI 23.04 kg/m  Pain Scale: CPOT       SpO2: SpO2: (!) 87 % O2 Device:SpO2: (!) 87 % O2 Flow Rate: .O2 Flow Rate (L/min): 4 L/min  IO: Intake/output summary:   Intake/Output Summary (Last 24 hours) at 02/01/2018 1027 Last data filed at 02/01/2018 0700 Gross per 24 hour  Intake 2555.53 ml  Output 150 ml  Net 2405.53 ml    LBM: Last BM Date: 01/31/18(pt was disimpacted ) Baseline Weight: Weight: 63.6 kg (140 lb 3.4 oz) Most recent weight: Weight: 62.8 kg (138 lb 7.2 oz)     Palliative Assessment/Data: 20%   Discussed with bedside nursing and Dr Broadus John  Time In: 1145 Time Out: 1300 Time Total: 75 minutes Greater than 50%  of this time was spent counseling and coordinating care related to the above assessment and plan.  Signed by: Wadie Lessen, NP   Please contact Palliative Medicine Team phone at [phone] for questions and concerns.  For individual provider: See Shea Evans

## 2018-02-01 NOTE — Plan of Care (Signed)
  Problem: Clinical Measurements: Goal: Respiratory complications will improve Outcome: Progressing   Problem: Pain Managment: Goal: General experience of comfort will improve Outcome: Progressing   Problem: Fluid Volume: Goal: Hemodynamic stability will improve Outcome: Progressing   Problem: Clinical Measurements: Goal: Ability to maintain clinical measurements within normal limits will improve Outcome: Not Progressing Goal: Will remain free from infection Outcome: Not Progressing   Problem: Activity: Goal: Risk for activity intolerance will decrease Outcome: Not Progressing   Problem: Nutrition: Goal: Adequate nutrition will be maintained Outcome: Not Progressing   Problem: Elimination: Goal: Will not experience complications related to bowel motility Outcome: Not Progressing Goal: Will not experience complications related to urinary retention Outcome: Not Progressing

## 2018-02-01 NOTE — Progress Notes (Signed)
[facility] for Heparin Indication:Pulmonary embolus & Mesenteric Artery thrombosis  Allergies  Allergen Reactions  . Pollen Extract     Sneezing, watery eyes   Patient Measurements: Height: 5\' 5"  (165.1 cm) Weight: 138 lb 7.2 oz (62.8 kg) IBW/kg (Calculated) : 57 Heparin Dosing Weight: 63.6 kg  Vital Signs: Temp: 98.7 F (37.1 C) (06/13 0742) Temp Source: Axillary (06/13 0742) BP: 108/54 (06/13 0742) Pulse Rate: 100 (06/12 2300)  Labs: Recent Labs    01/30/18 1635 01/31/18 1205 01/31/18 1418 01/31/18 1649 01/31/18 2205 02/01/18 0127 02/01/18 0636  HGB 15.9* 15.7*  --   --  14.6  --   --   HCT 48.8* 48.5*  --   --  43.0  --   --   PLT 267 251  --   --   --   --   --   HEPARINUNFRC  --   --   --   --   --   --  0.55  CREATININE 1.18* 1.10*  --  1.00  --   --   --   TROPONINI  --   --  0.21*  --   --  0.20* 0.25*   Estimated Creatinine Clearance: 35.7 mL/min (by C-G formula based on SCr of 1 mg/dL).  Assessment:  84 yo Female with PE and mesenteric artery thrombosis for heparin  Goal of Therapy:  Heparin level 0.3-0.7 units/ml Monitor platelets by anticoagulation protocol: Yes   Plan:  Continue Heparin at current rate Check heparin level in 6 hours to confirm   Phillis Knack, PharmD, BCPS  02/01/2018, 7:57 AM

## 2018-02-02 ENCOUNTER — Inpatient Hospital Stay (HOSPITAL_COMMUNITY): Payer: Medicare HMO

## 2018-02-02 LAB — HEPARIN LEVEL (UNFRACTIONATED)
HEPARIN UNFRACTIONATED: 0.24 [IU]/mL — AB (ref 0.30–0.70)
Heparin Unfractionated: 0.29 IU/mL — ABNORMAL LOW (ref 0.30–0.70)

## 2018-02-02 LAB — CBC
HCT: 43.6 % (ref 36.0–46.0)
Hemoglobin: 14 g/dL (ref 12.0–15.0)
MCH: 29.5 pg (ref 26.0–34.0)
MCHC: 32.1 g/dL (ref 30.0–36.0)
MCV: 91.8 fL (ref 78.0–100.0)
PLATELETS: UNDETERMINED 10*3/uL (ref 150–400)
RBC: 4.75 MIL/uL (ref 3.87–5.11)
RDW: 16.3 % — ABNORMAL HIGH (ref 11.5–15.5)
WBC: 18.8 10*3/uL — AB (ref 4.0–10.5)

## 2018-02-02 LAB — GLUCOSE, CAPILLARY
GLUCOSE-CAPILLARY: 145 mg/dL — AB (ref 65–99)
GLUCOSE-CAPILLARY: 136 mg/dL — AB (ref 65–99)
GLUCOSE-CAPILLARY: 166 mg/dL — AB (ref 65–99)
Glucose-Capillary: 161 mg/dL — ABNORMAL HIGH (ref 65–99)
Glucose-Capillary: 214 mg/dL — ABNORMAL HIGH (ref 65–99)
Glucose-Capillary: 84 mg/dL (ref 65–99)

## 2018-02-02 LAB — BASIC METABOLIC PANEL
Anion gap: 9 (ref 5–15)
BUN: 43 mg/dL — AB (ref 6–20)
CO2: 16 mmol/L — ABNORMAL LOW (ref 22–32)
Calcium: 8.8 mg/dL — ABNORMAL LOW (ref 8.9–10.3)
Chloride: 117 mmol/L — ABNORMAL HIGH (ref 101–111)
Creatinine, Ser: 1.32 mg/dL — ABNORMAL HIGH (ref 0.44–1.00)
GFR calc Af Amer: 41 mL/min — ABNORMAL LOW (ref >60)
GFR, EST NON AFRICAN AMERICAN: 35 mL/min — AB (ref >60)
GLUCOSE: 91 mg/dL (ref 65–99)
POTASSIUM: 4.4 mmol/L (ref 3.5–5.1)
Sodium: 142 mmol/L (ref 135–145)

## 2018-02-02 LAB — URINE CULTURE: Culture: 40000 — AB

## 2018-02-02 LAB — LACTIC ACID, PLASMA: Lactic Acid, Venous: 2.7 mmol/L (ref 0.5–1.9)

## 2018-02-02 LAB — PROCALCITONIN: Procalcitonin: 4.45 ng/mL

## 2018-02-02 MED ORDER — IPRATROPIUM-ALBUTEROL 0.5-2.5 (3) MG/3ML IN SOLN
3.0000 mL | Freq: Four times a day (QID) | RESPIRATORY_TRACT | Status: DC
  Administered 2018-02-02 – 2018-02-03 (×6): 3 mL via RESPIRATORY_TRACT
  Filled 2018-02-02 (×6): qty 3

## 2018-02-02 NOTE — Progress Notes (Signed)
Progress Note  Patient Name: Anna [name] Date of Encounter: 02/02/2018  Primary Cardiologist: New  Subjective   Pt on NRB    Inpatient Medications    Scheduled Meds: . chlorhexidine  15 mL Mouth Rinse BID  . insulin aspart  0-15 Units Subcutaneous Q4H  . mouth rinse  15 mL Mouth Rinse BID  . mouth rinse  15 mL Mouth Rinse q12n4p   Continuous Infusions: . sodium chloride 50 mL/hr at 02/02/18 0315  . diltiazem (CARDIZEM) infusion 10 mg/hr (02/02/18 0519)  . heparin 1,100 Units/hr (02/02/18 0811)  . meropenem (MERREM) IV Stopped (02/01/18 2220)   PRN Meds: acetaminophen, bisacodyl, metoprolol tartrate, ondansetron **OR** ondansetron (ZOFRAN) IV   Vital Signs    Vitals:   02/02/18 0500 02/02/18 0600 02/02/18 0700 02/02/18 0744  BP: 119/72 (!) 115/52 (!) 130/56   Pulse:      Resp: (!) 34 (!) 31 (!) 32   Temp:    97.9 F (36.6 C)  TempSrc:    Axillary  SpO2: 97% 97% 97%   Weight:      Height:        Intake/Output Summary (Last 24 hours) at 02/02/2018 1003 Last data filed at 02/02/2018 0700 Gross per 24 hour  Intake 2063.33 ml  Output 390 ml  Net 1673.33 ml   Filed Weights   01/31/18 0630 01/31/18 2300  Weight: 63.6 kg (140 lb 3.4 oz) 62.8 kg (138 lb 7.2 oz)    Telemetry    Afib   90s to 110s - Personally Reviewed  ECG      Physical Exam   GEN: not awake  NRB   .   Neck: Neck is full   Cardiac: Irreg irreg  no murmurs, rubs, or gallops.  Respiratory: Rhonchi bilaterally   GI: Decreased BS otherwise deferred    MS: No edema      Labs    Chemistry Recent Labs  Lab 01/30/18 1649  01/31/18 1649 01/31/18 2205 02/01/18 1228 02/02/18 0703  NA  --    < > 143 149* 147* 142  K  --    < > 3.3* 2.8* 3.7 4.4  CL  --    < > 115*  --  118* 117*  CO2  --    < > 19*  --  17* 16*  GLUCOSE  --    < > 294* 197* 187* 91  BUN  --    < > 33*  --  33* 43*  CREATININE  --    < > 1.00  --  1.23* 1.32*  CALCIUM  --    < > 9.1  --  9.1 8.8*  PROT 7.3  --    --   --   --   --   ALBUMIN 4.0  --   --   --   --   --   AST 25  --   --   --   --   --   ALT 13*  --   --   --   --   --   ALKPHOS 82  --   --   --   --   --   BILITOT 0.8  --   --   --   --   --   GFRNONAA  --    < > 49*  --  38* 35*  GFRAA  --    < > 57*  --  44* 41*  ANIONGAP  --    < >  9  --  12 9   < > = values in this interval not displayed.     Hematology Recent Labs  Lab 01/31/18 1205 01/31/18 2205 02/01/18 1228 02/02/18 0548  WBC 26.2*  --  27.6* 18.8*  RBC 5.14*  --  4.87 4.75  HGB 15.7* 14.6 14.4 14.0  HCT 48.5* 43.0 45.7 43.6  MCV 94.4  --  93.8 91.8  MCH 30.5  --  29.6 29.5  MCHC 32.4  --  31.5 32.1  RDW 15.2  --  15.9* 16.3*  PLT 251  --  209 PLATELET CLUMPS NOTED ON SMEAR, UNABLE TO ESTIMATE    Cardiac Enzymes Recent Labs  Lab 01/31/18 1418 02/01/18 0127 02/01/18 0636 02/01/18 1228  TROPONINI 0.21* 0.20* 0.25* 0.13*    Recent Labs  Lab 01/30/18 2026  TROPIPOC 0.02     BNPNo results for input(s): BNP, PROBNP in the last 168 hours.   DDimer  Recent Labs  Lab 01/31/18 1418  DDIMER 4.11*     Radiology    Ct Angio Chest Pe W Or Wo Contrast  Result Date: 01/31/2018 CLINICAL DATA:  Inpatient. Severe sepsis. Fever. Abdominal pain. Leukocytosis. Clinical concern for intra-abdominal abscess. EXAM: CT ANGIOGRAPHY CHEST CT ABDOMEN AND PELVIS WITH CONTRAST TECHNIQUE: Multidetector CT imaging of the chest was performed using the standard protocol during bolus administration of intravenous contrast. Multiplanar CT image reconstructions and MIPs were obtained to evaluate the vascular anatomy. Multidetector CT imaging of the abdomen and pelvis was performed using the standard protocol during bolus administration of intravenous contrast. CONTRAST:  40mL ISOVUE-370 IOPAMIDOL (ISOVUE-370) INJECTION 76% COMPARISON:  12/30/2016 chest CT angiogram. 07/10/2017 pelvis CT. 01/30/2018 chest radiograph. FINDINGS: CTA CHEST FINDINGS Cardiovascular: The study is moderate  quality for the evaluation of pulmonary embolism, with some motion degradation limiting evaluation of the segmental and subsegmental vessels. No central or lobar pulmonary emboli. There are segmental and subsegmental pulmonary emboli in the left upper lobe (series 7/image 78), which appear acute/subacute. There is a thin web like filling defect within a segmental right upper lobe pulmonary artery branch (series 7/image 92). No additional pulmonary artery branch filling defects. Top-normal caliber main pulmonary artery (3.0 cm diameter). Mild-to-moderate cardiomegaly. No significant pericardial fluid/thickening. Left anterior descending and right coronary atherosclerosis. Mediastinum/Nodes: Stable goiter asymmetric to the left with heterogeneous thyroid parenchyma. Unremarkable esophagus. No axillary adenopathy. Mild right paratracheal adenopathy measuring up to the 1.1 cm (series 4/image 32), stable since 12/30/2016, most compatible with benign reactive adenopathy. No additional pathologically enlarged mediastinal nodes. No hilar adenopathy. Lungs/Pleura: No pneumothorax. Trace dependent bilateral pleural effusions. Nonspecific soft tissue layering in bronchus intermedius and central right lower lobe airways. Subpleural 3 mm solid anterior left upper lobe pulmonary nodule (series 6/image 27), stable since 12/30/2016 CT, considered benign. Mild patchy consolidation, ground-glass opacity and volume loss throughout the bilateral dependent lungs. No new significant pulmonary nodules in the aerated portions of the lungs. Musculoskeletal: No aggressive appearing focal osseous lesions. Moderate thoracic spondylosis. Review of the MIP images confirms the above findings. CT ABDOMEN and PELVIS FINDINGS Motion degraded scan, limiting assessment. Hepatobiliary: Normal liver with no liver mass. Normal gallbladder with no radiopaque cholelithiasis. No biliary ductal dilatation. Pancreas: Normal, with no mass or duct dilation.  Spleen: Normal size. No mass. Adrenals/Urinary Tract: Mild thickening of the adrenal glands bilaterally without discrete adrenal nodules. No hydronephrosis. Subcentimeter hypodense renal cortical lesions in both kidneys are too small to characterize and require no follow-up. Normal bladder. Stomach/Bowel: Normal non-distended stomach.  Normal caliber small bowel. Diffuse haziness of the small bowel wall without definite small bowel wall thickening. No pneumatosis. Appendix not discretely visualized. No pericecal inflammatory changes. Prominent rectal stool distending the rectum up to 8.6 cm diameter with the suggestion of mild rectal wall thickening, without significant acute perirectal fat stranding or definite pneumatosis. Otherwise no colonic wall thickening. Vascular/Lymphatic: Atherosclerotic nonaneurysmal abdominal aorta. Patent portal, splenic, hepatic and renal veins. 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 No pathologically enlarged lymph nodes in the abdomen or pelvis. Reproductive: Grossly normal uterus.  No adnexal mass. Other: No pneumoperitoneum, ascites or focal fluid collection. Musculoskeletal: No aggressive appearing focal osseous lesions. Marked lumbar spondylosis. Review of the MIP images confirms the above findings. IMPRESSION: 1. 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 2. Diffuse haziness of the wall of the small bowel without definite small bowel wall thickening or pneumatosis. Early bowel ischemia not excluded. No free air or abscess. 3. Acute/subacute pulmonary embolism in segmental/subsegmental left upper lobe pulmonary artery branches. Evidence of chronic pulmonary embolism in the right upper lobe. No central pulmonary embolism. Top-normal caliber main pulmonary artery. 4. Mild-to-moderate cardiomegaly. Trace dependent bilateral pleural  effusions. 5. Dependent opacity with some volume loss throughout both lungs, favor atelectasis, difficult to exclude a component of aspiration or pneumonia. 6. Prominent rectal stool with mild rectal wall thickening, cannot exclude rectal fecal impaction or early stercoral colitis. 7. Aortic Atherosclerosis (ICD10-I70.0). Critical Value/emergent results were called by telephone at the time of interpretation on 01/31/2018 at 4:20 pm to Dr. Irene Pap , who verbally acknowledged these results. Electronically Signed   By: Ilona Sorrel M.D.   On: 01/31/2018 16:27   Ct Abdomen Pelvis W Contrast  Result Date: 01/31/2018 CLINICAL DATA:  Inpatient. Severe sepsis. Fever. Abdominal pain. Leukocytosis. Clinical concern for intra-abdominal abscess. EXAM: CT ANGIOGRAPHY CHEST CT ABDOMEN AND PELVIS WITH CONTRAST TECHNIQUE: Multidetector CT imaging of the chest was performed using the standard protocol during bolus administration of intravenous contrast. Multiplanar CT image reconstructions and MIPs were obtained to evaluate the vascular anatomy. Multidetector CT imaging of the abdomen and pelvis was performed using the standard protocol during bolus administration of intravenous contrast. CONTRAST:  27mL ISOVUE-370 IOPAMIDOL (ISOVUE-370) INJECTION 76% COMPARISON:  12/30/2016 chest CT angiogram. 07/10/2017 pelvis CT. 01/30/2018 chest radiograph. FINDINGS: CTA CHEST FINDINGS Cardiovascular: The study is moderate quality for the evaluation of pulmonary embolism, with some motion degradation limiting evaluation of the segmental and subsegmental vessels. No central or lobar pulmonary emboli. There are segmental and subsegmental pulmonary emboli in the left upper lobe (series 7/image 78), which appear acute/subacute. There is a thin web like filling defect within a segmental right upper lobe pulmonary artery branch (series 7/image 92). No additional pulmonary artery branch filling defects. Top-normal caliber main pulmonary artery  (3.0 cm diameter). Mild-to-moderate cardiomegaly. No significant pericardial fluid/thickening. Left anterior descending and right coronary atherosclerosis. Mediastinum/Nodes: Stable goiter asymmetric to the left with heterogeneous thyroid parenchyma. Unremarkable esophagus. No axillary adenopathy. Mild right paratracheal adenopathy measuring up to the 1.1 cm (series 4/image 32), stable since 12/30/2016, most compatible with benign reactive adenopathy. No additional pathologically enlarged mediastinal nodes. No hilar adenopathy. Lungs/Pleura: No pneumothorax. Trace dependent bilateral pleural effusions. Nonspecific soft tissue layering in bronchus intermedius and central right lower lobe airways. Subpleural 3 mm solid anterior left upper lobe pulmonary nodule (series 6/image 27), stable since 12/30/2016 CT, considered benign.  Mild patchy consolidation, ground-glass opacity and volume loss throughout the bilateral dependent lungs. No new significant pulmonary nodules in the aerated portions of the lungs. Musculoskeletal: No aggressive appearing focal osseous lesions. Moderate thoracic spondylosis. Review of the MIP images confirms the above findings. CT ABDOMEN and PELVIS FINDINGS Motion degraded scan, limiting assessment. Hepatobiliary: Normal liver with no liver mass. Normal gallbladder with no radiopaque cholelithiasis. No biliary ductal dilatation. Pancreas: Normal, with no mass or duct dilation. Spleen: Normal size. No mass. Adrenals/Urinary Tract: Mild thickening of the adrenal glands bilaterally without discrete adrenal nodules. No hydronephrosis. Subcentimeter hypodense renal cortical lesions in both kidneys are too small to characterize and require no follow-up. Normal bladder. Stomach/Bowel: Normal non-distended stomach. Normal caliber small bowel. Diffuse haziness of the small bowel wall without definite small bowel wall thickening. No pneumatosis. Appendix not discretely visualized. No pericecal  inflammatory changes. Prominent rectal stool distending the rectum up to 8.6 cm diameter with the suggestion of mild rectal wall thickening, without significant acute perirectal fat stranding or definite pneumatosis. Otherwise no colonic wall thickening. Vascular/Lymphatic: Atherosclerotic nonaneurysmal abdominal aorta. Patent portal, splenic, hepatic and renal veins. 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 No pathologically enlarged lymph nodes in the abdomen or pelvis. Reproductive: Grossly normal uterus.  No adnexal mass. Other: No pneumoperitoneum, ascites or focal fluid collection. Musculoskeletal: No aggressive appearing focal osseous lesions. Marked lumbar spondylosis. Review of the MIP images confirms the above findings. IMPRESSION: 1. 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 2. Diffuse haziness of the wall of the small bowel without definite small bowel wall thickening or pneumatosis. Early bowel ischemia not excluded. No free air or abscess. 3. Acute/subacute pulmonary embolism in segmental/subsegmental left upper lobe pulmonary artery branches. Evidence of chronic pulmonary embolism in the right upper lobe. No central pulmonary embolism. Top-normal caliber main pulmonary artery. 4. Mild-to-moderate cardiomegaly. Trace dependent bilateral pleural effusions. 5. Dependent opacity with some volume loss throughout both lungs, favor atelectasis, difficult to exclude a component of aspiration or pneumonia. 6. Prominent rectal stool with mild rectal wall thickening, cannot exclude rectal fecal impaction or early stercoral colitis. 7. Aortic Atherosclerosis (ICD10-I70.0). Critical Value/emergent results were called by telephone at the time of interpretation on 01/31/2018 at 4:20 pm to Dr. Irene Pap , who verbally acknowledged these results. Electronically Signed    By: Ilona Sorrel M.D.   On: 01/31/2018 16:27   Dg Chest Port 1 View  Result Date: 02/02/2018 CLINICAL DATA:  Hypoxia EXAM: PORTABLE CHEST 1 VIEW COMPARISON:  Chest radiograph January 30, 2018 and chest CT January 31, 2018 FINDINGS: There is no appreciable edema or consolidation. There is cardiomegaly with pulmonary vascularity within normal limits. No adenopathy. Bones are osteoporotic. IMPRESSION: Stable cardiac prominence.  No edema or consolidation. Electronically Signed   By: Lowella Grip III M.D.   On: 02/02/2018 09:18    Cardiac Studies    ECHO 02/02/18 ------------------------------------------------------------------- Study Conclusions  - Left ventricle: The cavity size was normal. Wall thickness was   increased in a pattern of moderate LVH. There was focal basal   hypertrophy. Systolic function was severely reduced. The   estimated ejection fraction was in the range of 20% to 25%.   Akinesis of the apicalanteroseptal and apical myocardium. - Ventricular septum: Septal motion showed abnormal function and   dyssynergy. - Right atrium: The atrium was mildly dilated. - Pulmonary arteries: Systolic pressure  was moderately increased.   PA peak pressure: 56 mm Hg (S).  ------------------------------------------------------------------- Study data:  Comparison was made to the study of 09/16/2016.  Study status:  Routine.  Procedure:  Patient appears to be in atrial fibrillation. Transthoracic echocardiography. Image quality was adequate. Intravenous contrast (Definity) was administered.  Study completion:  There were no complications.          Transthoracic echocardiography.  M-mode, complete 2D, spectral Doppler, and color Doppler.  Birthdate:  Patient birthdate: 05-Jul-1929.  Age:  Patient is 84 yr old.  Sex:  Gender: female.    BMI: 23 kg/m^2.  Blood pressure:     108/54  Patient status:  Inpatient.  Study date: Study date: 02/01/2018. Study time: 09:11 AM.  Location:  ICU/CCU    -------------------------------------------------------------------  ------------------------------------------------------------------- Left ventricle:  The cavity size was normal. Wall thickness was increased in a pattern of moderate LVH. There was focal basal hypertrophy. Systolic function was severely reduced. The estimated ejection fraction was in the range of 20% to 25%.  Regional wall motion abnormalities:   Akinesis of the apicalanteroseptal and apical myocardium. The study was not technically sufficient to allow evaluation of LV diastolic dysfunction due to atrial fibrillation.  ------------------------------------------------------------------- Aortic valve:   Mildly thickened leaflets. Cusp separation was normal.  Doppler:  Transvalvular velocity was within the normal range. There was no stenosis. There was no regurgitation.    VTI ratio of LVOT to aortic valve: 0.72. Indexed valve area (VTI): 1.22 cm^2/m^2. Peak velocity ratio of LVOT to aortic valve: 0.61. Indexed valve area (Vmax): 1.03 cm^2/m^2. Mean velocity ratio of LVOT to aortic valve: 0.59. Indexed valve area (Vmean): 0.99 cm^2/m^2.    Mean gradient (S): 7 mm Hg. Peak gradient (S): 12 mm Hg.  ------------------------------------------------------------------- Aorta:  Aortic root: The aortic root was normal in size. Ascending aorta: The ascending aorta was normal in size.  ------------------------------------------------------------------- Mitral valve:   Structurally normal valve.   Leaflet separation was normal.  Doppler:  Transvalvular velocity was within the normal range. There was no evidence for stenosis. There was trivial regurgitation.    Valve area by pressure half-time: 4.58 cm^2. Indexed valve area by pressure half-time: 2.69 cm^2/m^2.    Peak gradient (D): 3 mm Hg.  ------------------------------------------------------------------- Left atrium:  The atrium was normal in  size.  ------------------------------------------------------------------- Right ventricle:  The cavity size was normal. Systolic function was normal.  ------------------------------------------------------------------- Ventricular septum:   Septal motion showed abnormal function and dyssynergy.  ------------------------------------------------------------------- Pulmonic valve:    The valve appears to be grossly normal. Doppler:  There was trivial regurgitation.  ------------------------------------------------------------------- Tricuspid valve:   Structurally normal valve. The valve appears to be grossly normal.   Leaflet separation was normal.  Doppler: Transvalvular velocity was within the normal range. There was mild regurgitation.  ------------------------------------------------------------------- Pulmonary artery:   Systolic pressure was moderately increased.  ------------------------------------------------------------------- Right atrium:  The atrium was mildly dilated.  ------------------------------------------------------------------- Pericardium:  There was no pericardial effusion.  ------------------------------------------------------------------- Measurements   Left ventricle                           Value          Reference  LV ID, ED, PLAX chordal                  45    mm       43 - 52  LV ID, ES, PLAX chordal  38    mm       23 - 38  LV fx shortening, PLAX chordal   (L)     16    %        >=29  LV PW thickness, ED                      13    mm       ----------  IVS/LV PW ratio, ED                      1.15           <=1.3  Stroke volume, 2D                        41    ml       ----------  Stroke volume/bsa, 2D                    24    ml/m^2   ----------  LV ejection fraction, 1-p A4C            22    %        ----------  LV end-diastolic volume, 2-p             76    ml       ----------  LV end-systolic volume, 2-p               58    ml       ----------  LV ejection fraction, 2-p                23    %        ----------  Stroke volume, 2-p                       17    ml       ----------  LV end-diastolic volume/bsa, 2-p         44    ml/m^2   ----------  LV end-systolic volume/bsa, 2-p          34    ml/m^2   ----------  Stroke volume/bsa, 2-p                   10    ml/m^2   ----------    Ventricular septum                       Value          Reference  IVS thickness, ED                        15    mm       ----------    LVOT                                     Value          Reference  LVOT ID, S                               19    mm       ----------  LVOT area  2.84  cm^2     ----------  LVOT peak velocity, S                    106   cm/s     ----------  LVOT mean velocity, S                    73.4  cm/s     ----------  LVOT VTI, S                              14.4  cm       ----------    Aortic valve                             Value          Reference  Aortic valve peak velocity, S            173   cm/s     ----------  Aortic valve mean velocity, S            124   cm/s     ----------  Aortic valve VTI, S                      19.9  cm       ----------  Aortic mean gradient, S                  7     mm Hg    ----------  Aortic peak gradient, S                  12    mm Hg    ----------  VTI ratio, LVOT/AV                       0.72           ----------  Aortic valve area/bsa, VTI               1.22  cm^2/m^2 ----------  Velocity ratio, peak, LVOT/AV            0.61           ----------  Aortic valve area/bsa, peak              1.03  cm^2/m^2 ----------  velocity  Velocity ratio, mean, LVOT/AV            0.59           ----------  Aortic valve area/bsa, mean              0.99  cm^2/m^2 ----------  velocity    Aorta                                    Value          Reference  Aortic root ID, ED                       32    mm       ----------  Ascending aorta ID, A-P, S                29    mm       ----------  Left atrium                              Value          Reference  LA ID, A-P, ES                           28    mm       ----------  LA ID/bsa, A-P                           1.64  cm/m^2   <=2.2  LA volume, S                             56.1  ml       ----------  LA volume/bsa, S                         33    ml/m^2   ----------  LA volume, ES, 1-p A4C                   40.9  ml       ----------  LA volume/bsa, ES, 1-p A4C               24    ml/m^2   ----------  LA volume, ES, 1-p A2C                   79.8  ml       ----------  LA volume/bsa, ES, 1-p A2C               46.9  ml/m^2   ----------    Mitral valve                             Value          Reference  Mitral E-wave peak velocity              91.9  cm/s     ----------  Mitral deceleration time                 158   ms       150 - 230  Mitral pressure half-time                46    ms       ----------  Mitral peak gradient, D                  3     mm Hg    ----------  Mitral valve area, PHT, DP               4.58  cm^2     ----------  Mitral valve area/bsa, PHT, DP           2.69  cm^2/m^2 ----------    Pulmonary arteries                       Value          Reference  PA pressure, S, DP               (H)     56  mm Hg    <=30    Tricuspid valve                          Value          Reference  Tricuspid regurg peak velocity           319   cm/s     ----------  Tricuspid peak RV-RA gradient            41    mm Hg    ----------    Right atrium                             Value          Reference  RA ID, S-I, ES, A4C              (H)     64    mm       34 - 49  RA area, ES, A4C                 (H)     20    cm^2     8.3 - 19.5  RA volume, ES, A/L                       51.5  ml       ----------  RA volume/bsa, ES, A/L                   30.3  ml/m^2   ----------    Systemic veins                           Value          Reference  Estimated CVP                            15     mm Hg    ----------    Right ventricle                          Value          Reference  TAPSE                                    15.7  mm       ----------  RV pressure, S, DP               (H)     56    mm Hg    <=30  Legend: (L)  and  (H)  mark values outside specified reference range.  ------------------------------------------------------------------- Prepared and Electronically Authenticated by  Mertie Moores, M.D. 2019-06-13T13:[id number]:36  Patient Profile     84 y.o. 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      Signed, Dorris Carnes, MD  02/02/2018, 10:03 AM

## 2018-02-02 NOTE — Progress Notes (Signed)
   Discuss with family and they are agreeable there are no options for vascular intervention. I am here this weekend and available as needed.   Evalee Gerard C. Donzetta Matters, MD [facility] Office: [phone] Pager: [phone]

## 2018-02-02 NOTE — Progress Notes (Signed)
CRITICAL VALUE ALERT  Critical Value:  Lactic acid 2.7  Date & Time Notied:  02/02/2018 @ 0700  Provider Notified: [facility] mid-level [facility]  Orders Received/Actions taken: awaiting

## 2018-02-02 NOTE — Progress Notes (Signed)
Patient was on room air and became acutely hypoxic with sats in the 70's. Patient placed on NRB @15L  without any change. RT and MD paged.  STAT CXR ordered as well as NTS per MD.  RT suctioned patient but did not get anything.  Sats increased on their own to 96% after ~74minutes.  Will continue to monitor.

## 2018-02-02 NOTE — Anesthesia Postprocedure Evaluation (Signed)
Anesthesia Post Note  Patient: [name]  Procedure(s) Performed: mechanical thrombectomy of superior mesenteric artery, ultrasound guided access right groin (N/A ) AORTOGRAM     Patient location during evaluation: PACU Anesthesia Type: MAC Level of consciousness: awake and alert Pain management: pain level controlled Vital Signs Assessment: post-procedure vital signs reviewed and stable Respiratory status: spontaneous breathing Cardiovascular status: stable Anesthetic complications: no    Last Vitals:  Vitals:   02/02/18 0900 02/02/18 1000  BP: (!) 120/55 109/62  Pulse:    Resp: (!) 39 (!) 35  Temp:    SpO2: (!) 77% 100%    Last Pain:  Vitals:   02/02/18 0800  TempSrc: (P) Axillary                 [name]

## 2018-02-02 NOTE — Progress Notes (Signed)
ANTICOAGULATION CONSULT NOTE - Follow Up Consult  Pharmacy Consult for heparin Indication: pulmonary embolus and mesenteric artery thrombosis  Labs: Recent Labs    01/30/18 1635 01/31/18 1205  01/31/18 1649 01/31/18 2205 02/01/18 0127 02/01/18 0636 02/01/18 1228 02/02/18 0548  HGB 15.9* 15.7*  --   --  14.6  --   --  14.4  --   HCT 48.8* 48.5*  --   --  43.0  --   --  45.7  --   PLT 267 251  --   --   --   --   --  209  --   HEPARINUNFRC  --   --   --   --   --   --  0.55 0.55 0.24*  CREATININE 1.18* 1.10*  --  1.00  --   --   --  1.23*  --   TROPONINI  --   --    < >  --   --  0.20* 0.25* 0.13*  --    < > = values in this interval not displayed.    Assessment: 84yo female subtherapeutic on heparin after two levels at goal; RN reports no gtt issues or signs of bleeding.  Goal of Therapy:  Heparin level 0.3-0.7 units/ml   Plan:  Will increase heparin gtt by 1-2 units/kg/hr to 1100 units/hr and check level in 8 hours.    Wynona Neat, PharmD, BCPS  02/02/2018,7:03 AM

## 2018-02-02 NOTE — Progress Notes (Signed)
[facility] for Heparin Indication:Pulmonary embolus & Mesenteric Artery thrombosis, and afib  Allergies  Allergen Reactions  . Pollen Extract     Sneezing, watery eyes   Patient Measurements: Height: 5\' 5"  (165.1 cm) Weight: 138 lb 7.2 oz (62.8 kg) IBW/kg (Calculated) : 57 Heparin Dosing Weight: 63.6 kg  Vital Signs: Temp: 97.9 F (36.6 C) (06/14 1223) Temp Source: Axillary (06/14 1223) BP: 116/69 (06/14 1500)  Labs: Recent Labs    01/31/18 1205  01/31/18 1649 01/31/18 2205 02/01/18 0127  02/01/18 0636 02/01/18 1228 02/02/18 0548 02/02/18 0703 02/02/18 1456  HGB 15.7*  --   --  14.6  --   --   --  14.4 14.0  --   --   HCT 48.5*  --   --  43.0  --   --   --  45.7 43.6  --   --   PLT 251  --   --   --   --   --   --  209 PLATELET CLUMPS NOTED ON SMEAR, UNABLE TO ESTIMATE  --   --   HEPARINUNFRC  --   --   --   --   --    < > 0.55 0.55 0.24*  --  0.29*  CREATININE 1.10*  --  1.00  --   --   --   --  1.23*  --  1.32*  --   TROPONINI  --    < >  --   --  0.20*  --  0.25* 0.13*  --   --   --    < > = values in this interval not displayed.   Estimated Creatinine Clearance: 27 mL/min (A) (by C-G formula based on SCr of 1.32 mg/dL (H)).  Assessment:  84 yo Female with PE and mesenteric artery thrombosis for heparin. S/p OR w/ TPA but unsuccessful attempt at mechanical thrombectomy.  No po anticoagulation due to ischemic bowel  Heparin level is just slightly below goal this afternoon.  No overt bleeding or complications noted.  CBC stable.  NO issues with IV infusion per RN.  Goal of Therapy:  Heparin level 0.3-0.7 units/ml Monitor platelets by anticoagulation protocol: Yes   Plan:  -Increase IV heparin to 1150 units/hr. -Recheck heparin level with AM labs. -Daily heparin level and CBC.  Marguerite Olea, [facility] Clinical Pharmacist Pager [phone]  02/02/2018 4:14 PM

## 2018-02-02 NOTE — Progress Notes (Signed)
PROGRESS NOTE    Anna [name]  VWU:[id number] DOB: 27-Apr-1929 DOA: 01/30/2018 PCP: Dorothyann Peng, NP  Brief Narrative: Anna [name] is a 84 year old chronically ill female with history of advanced dementia, chronic systolic CHF with EF of 29%, chronic atrial fibrillation not on anticoagulation, chronic sacral wound, diabetes mellitus was admitted on 6/11 with lethargy. -She was found to have acute and chronic pulmonary emboli, supportive mesenteric artery thrombosis, bilateral lower lobe pneumonia, atrial fibrillation with RVR. -Continues to remain unresponsive, seen by vascular surgery last p.m., was taken to the OR and had a unsuccessful attempt at mechanical thrombectomy, the thrombus was felt to be well organized suggesting a more chronic appearance.  -Remains unresponsive, on IV heparin, Abx for aspiration pneumonia -ECHO with worsening EF to 20% now from 35-40% previously  Assessment & Plan:     Superior mesenteric artery thrombosis, - nearly occlusive thrombus, continues to have very tender abd exam, consistent with ongoing bowel ischemia/infacrtion, lactate increased -continue IV heparin, had an unsuccessful attempt at mechanical thrombectomy 6/12 -She is not a surgical candidate -Prognosis very poor, due to this and other serious comorbidities -Palliative medicine consulting, plan for another meeting with son ( from out of town) and daughter, I think daughter does not understand the gravity of her illness and accept her VERY POOR PROGNOSIS  Acute and chronic pulmonary emboli -CT angiogram 6/12 noted acute/subacute pulmonary embolism in the segmental and subsegmental left upper lobe pulmonary artery branches and evidence of chronic PE in right upper lobe -Continue heparin drip -She is at risk of bleeding especially from bowel ischemia, not appropriate to transition to alternative anticoagulation at this time  Aspiration pneumonia -currently on meropenem, continue -She is not  alert enough for SLP evaluation -History well suggestive of long-standing dysphagia prior to admission -transient worsening hypoxia after turning, likely from mucus plugging, added nebs, CXR unremarkable, monitor, supportive care  Metabolic encephalopathy -Multifactorial, due to aspiration pneumonia, possible bowel ischemia -Follow blood cultures, continue current antibiotics -Nothing by mouth  Atrial fibrillation with RVR -Continue IV Cardizem -also on IV heparin for acute PE and SMA thrombosis  Advanced dementia -Daughter in significant denial -Mumbles yes and no at baseline, Mostly bedbound  Chronic systolic CHF -EF 56% -continue half-normal saline at 50 mL an hour now while nothing by mouth  Diabetes mellitus -CBG stable, continue sliding scale insulin -If she becomes hypoglycemic will change IV fluids to D5 half-normal saline  Global: continues to remain critically ill, unresponsive, high risk of decompensation/death -Daughter continues to 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, adult, palliative care following -Do not think she will survive this hospitalization -plan for Palliative Meeting again tomorrow  DVT prophylaxis: Heparin gtt Code Status: No Code Blue/No CPR/No intubation but ok with BIPAP Family Communication: Daughter at bedside Disposition Plan: remain in ICU  Consultants:   VVS  Cards  PCCM  Procedures: Procedure:   #1: Ultrasound-guided access, right femoral artery                         #2: Abdominal aortogram                         #3: First order catheterization, superior mesenteric artery                         #4: Superior mesenteric artery angiogram                         #  5: Catheter directed administration of TPA into the superior mesenteric artery                         #6: Mechanical thrombectomy, superior mesenteric artery Findings: The thrombus was well organized, suggesting a more chronic appearance.  Unsuccessful attempt at  mechanical thrombectomy     Antimicrobials:    Subjective: -increased O2 needs after turning this am  Objective: Vitals:   02/02/18 1300 02/02/18 1330 02/02/18 1400 02/02/18 1500  BP: 124/61  117/60 116/69  Pulse:      Resp: (!) 34  (!) 33 (!) 32  Temp:      TempSrc:      SpO2: 100% 100% 98% 98%  Weight:      Height:        Intake/Output Summary (Last 24 hours) at 02/02/2018 1528 Last data filed at 02/02/2018 1500 Gross per 24 hour  Intake 2380.15 ml  Output 535 ml  Net 1845.15 ml   Filed Weights   01/31/18 0630 01/31/18 2300  Weight: 63.6 kg (140 lb 3.4 oz) 62.8 kg (138 lb 7.2 oz)    Examination:  General exam: unresponsive, winces to pain, elderly, chronically ill appearing HEENT: PERRLA Lungs: Ronchi at B bases CVS: [id number], ? systolic murmur Abd: soft, mildly distended, tender diffusely, BS present Extremities: trace edema Skin: sacral wound quarter sized Psychiatry:unable to assess   Data Reviewed:   CBC: Recent Labs  Lab 01/30/18 1635 01/31/18 1205 01/31/18 2205 02/01/18 1228 02/02/18 0548  WBC 9.8 26.2*  --  27.6* 18.8*  HGB 15.9* 15.7* 14.6 14.4 14.0  HCT 48.8* 48.5* 43.0 45.7 43.6  MCV 94.8 94.4  --  93.8 91.8  PLT 267 251  --  209 PLATELET CLUMPS NOTED ON SMEAR, UNABLE TO ESTIMATE   Basic Metabolic Panel: Recent Labs  Lab 01/30/18 1635 01/31/18 1205 01/31/18 1649 01/31/18 2205 02/01/18 1228 02/02/18 0703  NA 145 149* 143 149* 147* 142  K 3.6 3.8 3.3* 2.8* 3.7 4.4  CL 107 116* 115*  --  118* 117*  CO2 26 18* 19*  --  17* 16*  GLUCOSE 225* 361* 294* 197* 187* 91  BUN 34* 34* 33*  --  33* 43*  CREATININE 1.18* 1.10* 1.00  --  1.23* 1.32*  CALCIUM 10.3 9.7 9.1  --  9.1 8.8*   GFR: Estimated Creatinine Clearance: 27 mL/min (A) (by C-G formula based on SCr of 1.32 mg/dL (H)). Liver Function Tests: Recent Labs  Lab 01/30/18 1649  AST 25  ALT 13*  ALKPHOS 82  BILITOT 0.8  PROT 7.3  ALBUMIN 4.0   No results for input(s):  LIPASE, AMYLASE in the last 168 hours. No results for input(s): AMMONIA in the last 168 hours. Coagulation Profile: No results for input(s): INR, PROTIME in the last 168 hours. Cardiac Enzymes: Recent Labs  Lab 01/31/18 1418 02/01/18 0127 02/01/18 0636 02/01/18 1228  TROPONINI 0.21* 0.20* 0.25* 0.13*   BNP (last 3 results) No results for input(s): PROBNP in the last 8760 hours. HbA1C: Recent Labs    01/31/18 1158  HGBA1C 6.0*   CBG: Recent Labs  Lab 02/01/18 1941 02/01/18 2331 02/02/18 0424 02/02/18 0748 02/02/18 1227  GLUCAP 149* 139* 145* 84 136*   Lipid Profile: No results for input(s): CHOL, HDL, LDLCALC, TRIG, CHOLHDL, LDLDIRECT in the last 72 hours. Thyroid Function Tests: Recent Labs    01/31/18 1158  TSH 0.206*   Anemia Panel: No results for input(s):  VITAMINB12, FOLATE, FERRITIN, TIBC, IRON, RETICCTPCT in the last 72 hours. Urine analysis:    Component Value Date/Time   COLORURINE YELLOW 01/30/2018 2009   APPEARANCEUR CLEAR 01/30/2018 2009   LABSPEC 1.016 01/30/2018 2009   PHURINE 5.0 01/30/2018 2009   GLUCOSEU NEGATIVE 01/30/2018 2009   HGBUR NEGATIVE 01/30/2018 2009   BILIRUBINUR NEGATIVE 01/30/2018 2009   BILIRUBINUR Negative 02/11/2017 1234   KETONESUR 5 (A) 01/30/2018 2009   PROTEINUR NEGATIVE 01/30/2018 2009   UROBILINOGEN 0.2 02/11/2017 1234   UROBILINOGEN 0.2 01/27/2015 1353   NITRITE NEGATIVE 01/30/2018 2009   LEUKOCYTESUR LARGE (A) 01/30/2018 2009   Sepsis Labs: @LABRCNTIP (procalcitonin:4,lacticidven:4)  ) Recent Results (from the past 240 hour(s))  Urine culture     Status: Abnormal   Collection Time: 01/30/18  8:32 PM  Result Value Ref Range Status   Specimen Description   Final    URINE, RANDOM Performed at [facility], [place] [address], [place], [place] [address]    Special Requests   Final    NONE Performed at [facility], [place] [address], [place], [place] [address]    Culture (A)   Final    40,000 COLONIES/mL KLEBSIELLA PNEUMONIAE 80,000 COLONIES/mL LACTOBACILLUS SPECIES Standardized susceptibility testing for this organism is not available. Performed at [facility], [place] [address], [place], [place] [address]    Report Status 02/02/2018 FINAL  Final   Organism ID, Bacteria KLEBSIELLA PNEUMONIAE (A)  Final      Susceptibility   Klebsiella pneumoniae - MIC*    AMPICILLIN RESISTANT Resistant     CEFAZOLIN <=4 SENSITIVE Sensitive     CEFTRIAXONE <=1 SENSITIVE Sensitive     CIPROFLOXACIN <=0.25 SENSITIVE Sensitive     GENTAMICIN <=1 SENSITIVE Sensitive     IMIPENEM <=0.25 SENSITIVE Sensitive     NITROFURANTOIN 32 SENSITIVE Sensitive     TRIMETH/SULFA <=20 SENSITIVE Sensitive     AMPICILLIN/SULBACTAM 4 SENSITIVE Sensitive     PIP/TAZO <=4 SENSITIVE Sensitive     Extended ESBL NEGATIVE Sensitive     * 40,000 COLONIES/mL KLEBSIELLA PNEUMONIAE  MRSA PCR Screening     Status: None   Collection Time: 01/31/18  6:48 AM  Result Value Ref Range Status   MRSA by PCR NEGATIVE NEGATIVE Final    Comment:        The GeneXpert MRSA Assay (FDA approved for NASAL specimens only), is one component of a comprehensive MRSA colonization surveillance program. It is not intended to diagnose MRSA infection nor to guide or monitor treatment for MRSA infections. Performed at [facility], [place] [address], [place], [place] [address]   Culture, blood (routine x 2)     Status: None (Preliminary result)   Collection Time: 01/31/18  6:56 AM  Result Value Ref Range Status   Specimen Description   Final    BLOOD RIGHT HAND Performed at [facility] [address], [place], [place] [address]    Special Requests   Final    BOTTLES DRAWN AEROBIC AND ANAEROBIC Blood Culture adequate volume Performed at [facility] [address], [place], [place] [address]    Culture   Final    NO GROWTH 2 DAYS Performed at [facility] [address] [place], [place] [address]    Report Status PENDING  Incomplete  Culture, blood (routine x 2)     Status: None (Preliminary result)   Collection Time: 01/31/18  6:56 AM  Result  Value Ref Range Status   Specimen Description   Final    BLOOD LEFT HAND Performed at [facility] [address] [place], [place] [address]    Special Requests   Final    BOTTLES DRAWN AEROBIC AND ANAEROBIC Blood Culture adequate volume Performed at [facility] [address], [place], [place] [address]    Culture   Final    NO GROWTH 2 DAYS Performed at [facility] [address], [place], [place] [address]    Report Status PENDING  Incomplete         Radiology Studies: Ct Angio Chest Pe W Or Wo Contrast  Result Date: 01/31/2018 CLINICAL DATA:  Inpatient. Severe sepsis. Fever. Abdominal pain. Leukocytosis. Clinical concern for intra-abdominal abscess. EXAM: CT ANGIOGRAPHY CHEST CT ABDOMEN AND PELVIS WITH CONTRAST TECHNIQUE: Multidetector CT imaging of the chest was performed using the standard protocol during bolus administration of intravenous contrast. Multiplanar CT image reconstructions and MIPs were obtained to evaluate the vascular anatomy. Multidetector CT imaging of the abdomen and pelvis was performed using the standard protocol during bolus administration of intravenous contrast. CONTRAST:  51mL ISOVUE-370 IOPAMIDOL (ISOVUE-370) INJECTION 76% COMPARISON:  12/30/2016 chest CT angiogram. 07/10/2017 pelvis CT. 01/30/2018 chest radiograph. FINDINGS: CTA CHEST FINDINGS Cardiovascular: The study is moderate quality for the evaluation of pulmonary embolism, with some motion degradation limiting evaluation of the segmental and subsegmental vessels. No central or lobar pulmonary emboli. There are segmental and subsegmental pulmonary emboli in the left upper lobe (series 7/image 78), which appear acute/subacute. There is a thin web like  filling defect within a segmental right upper lobe pulmonary artery branch (series 7/image 92). No additional pulmonary artery branch filling defects. Top-normal caliber main pulmonary artery (3.0 cm diameter). Mild-to-moderate cardiomegaly. No significant pericardial fluid/thickening. Left anterior descending and right coronary atherosclerosis. Mediastinum/Nodes: Stable goiter asymmetric to the left with heterogeneous thyroid parenchyma. Unremarkable esophagus. No axillary adenopathy. Mild right paratracheal adenopathy measuring up to the 1.1 cm (series 4/image 32), stable since 12/30/2016, most compatible with benign reactive adenopathy. No additional pathologically enlarged mediastinal nodes. No hilar adenopathy. Lungs/Pleura: No pneumothorax. Trace dependent bilateral pleural effusions. Nonspecific soft tissue layering in bronchus intermedius and central right lower lobe airways. Subpleural 3 mm solid anterior left upper lobe pulmonary nodule (series 6/image 27), stable since 12/30/2016 CT, considered benign. Mild patchy consolidation, ground-glass opacity and volume loss throughout the bilateral dependent lungs. No new significant pulmonary nodules in the aerated portions of the lungs. Musculoskeletal: No aggressive appearing focal osseous lesions. Moderate thoracic spondylosis. Review of the MIP images confirms the above findings. CT ABDOMEN and PELVIS FINDINGS Motion degraded scan, limiting assessment. Hepatobiliary: Normal liver with no liver mass. Normal gallbladder with no radiopaque cholelithiasis. No biliary ductal dilatation. Pancreas: Normal, with no mass or duct dilation. Spleen: Normal size. No mass. Adrenals/Urinary Tract: Mild thickening of the adrenal glands bilaterally without discrete adrenal nodules. No hydronephrosis. Subcentimeter hypodense renal cortical lesions in both kidneys are too small to characterize and require no follow-up. Normal bladder. Stomach/Bowel: Normal non-distended  stomach. Normal caliber small bowel. Diffuse haziness of the small bowel wall without definite small bowel wall thickening. No pneumatosis. Appendix not discretely visualized. No pericecal inflammatory changes. Prominent rectal stool distending the rectum up to 8.6 cm diameter with the suggestion of mild rectal wall thickening, without significant acute perirectal fat stranding or definite pneumatosis. Otherwise no colonic wall thickening. Vascular/Lymphatic: Atherosclerotic nonaneurysmal abdominal aorta. Patent portal, splenic, hepatic and renal veins.  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 No pathologically enlarged lymph nodes in the abdomen or pelvis. Reproductive: Grossly normal uterus.  No adnexal mass. Other: No pneumoperitoneum, ascites or focal fluid collection. Musculoskeletal: No aggressive appearing focal osseous lesions. Marked lumbar spondylosis. Review of the MIP images confirms the above findings. IMPRESSION: 1. 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 2. Diffuse haziness of the wall of the small bowel without definite small bowel wall thickening or pneumatosis. Early bowel ischemia not excluded. No free air or abscess. 3. Acute/subacute pulmonary embolism in segmental/subsegmental left upper lobe pulmonary artery branches. Evidence of chronic pulmonary embolism in the right upper lobe. No central pulmonary embolism. Top-normal caliber main pulmonary artery. 4. Mild-to-moderate cardiomegaly. Trace dependent bilateral pleural effusions. 5. Dependent opacity with some volume loss throughout both lungs, favor atelectasis, difficult to exclude a component of aspiration or pneumonia. 6. Prominent rectal stool with mild rectal wall thickening, cannot exclude rectal fecal impaction or early stercoral colitis. 7. Aortic Atherosclerosis (ICD10-I70.0). Critical  Value/emergent results were called by telephone at the time of interpretation on 01/31/2018 at 4:20 pm to Dr. Irene Pap , who verbally acknowledged these results. Electronically Signed   By: Ilona Sorrel M.D.   On: 01/31/2018 16:27   Ct Abdomen Pelvis W Contrast  Result Date: 01/31/2018 CLINICAL DATA:  Inpatient. Severe sepsis. Fever. Abdominal pain. Leukocytosis. Clinical concern for intra-abdominal abscess. EXAM: CT ANGIOGRAPHY CHEST CT ABDOMEN AND PELVIS WITH CONTRAST TECHNIQUE: Multidetector CT imaging of the chest was performed using the standard protocol during bolus administration of intravenous contrast. Multiplanar CT image reconstructions and MIPs were obtained to evaluate the vascular anatomy. Multidetector CT imaging of the abdomen and pelvis was performed using the standard protocol during bolus administration of intravenous contrast. CONTRAST:  25mL ISOVUE-370 IOPAMIDOL (ISOVUE-370) INJECTION 76% COMPARISON:  12/30/2016 chest CT angiogram. 07/10/2017 pelvis CT. 01/30/2018 chest radiograph. FINDINGS: CTA CHEST FINDINGS Cardiovascular: The study is moderate quality for the evaluation of pulmonary embolism, with some motion degradation limiting evaluation of the segmental and subsegmental vessels. No central or lobar pulmonary emboli. There are segmental and subsegmental pulmonary emboli in the left upper lobe (series 7/image 78), which appear acute/subacute. There is a thin web like filling defect within a segmental right upper lobe pulmonary artery branch (series 7/image 92). No additional pulmonary artery branch filling defects. Top-normal caliber main pulmonary artery (3.0 cm diameter). Mild-to-moderate cardiomegaly. No significant pericardial fluid/thickening. Left anterior descending and right coronary atherosclerosis. Mediastinum/Nodes: Stable goiter asymmetric to the left with heterogeneous thyroid parenchyma. Unremarkable esophagus. No axillary adenopathy. Mild right paratracheal adenopathy  measuring up to the 1.1 cm (series 4/image 32), stable since 12/30/2016, most compatible with benign reactive adenopathy. No additional pathologically enlarged mediastinal nodes. No hilar adenopathy. Lungs/Pleura: No pneumothorax. Trace dependent bilateral pleural effusions. Nonspecific soft tissue layering in bronchus intermedius and central right lower lobe airways. Subpleural 3 mm solid anterior left upper lobe pulmonary nodule (series 6/image 27), stable since 12/30/2016 CT, considered benign. Mild patchy consolidation, ground-glass opacity and volume loss throughout the bilateral dependent lungs. No new significant pulmonary nodules in the aerated portions of the lungs. Musculoskeletal: No aggressive appearing focal osseous lesions. Moderate thoracic spondylosis. Review of the MIP images confirms the above findings. CT ABDOMEN and PELVIS FINDINGS Motion degraded scan, limiting assessment. Hepatobiliary: Normal liver with no liver mass. Normal gallbladder with no radiopaque cholelithiasis. No biliary ductal dilatation. Pancreas:  Normal, with no mass or duct dilation. Spleen: Normal size. No mass. Adrenals/Urinary Tract: Mild thickening of the adrenal glands bilaterally without discrete adrenal nodules. No hydronephrosis. Subcentimeter hypodense renal cortical lesions in both kidneys are too small to characterize and require no follow-up. Normal bladder. Stomach/Bowel: Normal non-distended stomach. Normal caliber small bowel. Diffuse haziness of the small bowel wall without definite small bowel wall thickening. No pneumatosis. Appendix not discretely visualized. No pericecal inflammatory changes. Prominent rectal stool distending the rectum up to 8.6 cm diameter with the suggestion of mild rectal wall thickening, without significant acute perirectal fat stranding or definite pneumatosis. Otherwise no colonic wall thickening. Vascular/Lymphatic: Atherosclerotic nonaneurysmal abdominal aorta. Patent portal,  splenic, hepatic and renal veins. 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 No pathologically enlarged lymph nodes in the abdomen or pelvis. Reproductive: Grossly normal uterus.  No adnexal mass. Other: No pneumoperitoneum, ascites or focal fluid collection. Musculoskeletal: No aggressive appearing focal osseous lesions. Marked lumbar spondylosis. Review of the MIP images confirms the above findings. IMPRESSION: 1. 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 2. Diffuse haziness of the wall of the small bowel without definite small bowel wall thickening or pneumatosis. Early bowel ischemia not excluded. No free air or abscess. 3. Acute/subacute pulmonary embolism in segmental/subsegmental left upper lobe pulmonary artery branches. Evidence of chronic pulmonary embolism in the right upper lobe. No central pulmonary embolism. Top-normal caliber main pulmonary artery. 4. Mild-to-moderate cardiomegaly. Trace dependent bilateral pleural effusions. 5. Dependent opacity with some volume loss throughout both lungs, favor atelectasis, difficult to exclude a component of aspiration or pneumonia. 6. Prominent rectal stool with mild rectal wall thickening, cannot exclude rectal fecal impaction or early stercoral colitis. 7. Aortic Atherosclerosis (ICD10-I70.0). Critical Value/emergent results were called by telephone at the time of interpretation on 01/31/2018 at 4:20 pm to Dr. Irene Pap , who verbally acknowledged these results. Electronically Signed   By: Ilona Sorrel M.D.   On: 01/31/2018 16:27   Dg Chest Port 1 View  Result Date: 02/02/2018 CLINICAL DATA:  Hypoxia EXAM: PORTABLE CHEST 1 VIEW COMPARISON:  Chest radiograph January 30, 2018 and chest CT January 31, 2018 FINDINGS: There is no appreciable edema or consolidation. There is cardiomegaly with pulmonary vascularity within  normal limits. No adenopathy. Bones are osteoporotic. IMPRESSION: Stable cardiac prominence.  No edema or consolidation. Electronically Signed   By: Lowella Grip III M.D.   On: 02/02/2018 09:18        Scheduled Meds: . chlorhexidine  15 mL Mouth Rinse BID  . insulin aspart  0-15 Units Subcutaneous Q4H  . ipratropium-albuterol  3 mL Nebulization Q6H  . mouth rinse  15 mL Mouth Rinse BID  . mouth rinse  15 mL Mouth Rinse q12n4p   Continuous Infusions: . sodium chloride 50 mL/hr at 02/02/18 1500  . diltiazem (CARDIZEM) infusion 10 mg/hr (02/02/18 1500)  . heparin 1,100 Units/hr (02/02/18 1500)  . meropenem (MERREM) IV Stopped (02/02/18 1059)     LOS: 3 days    Time spent: 32min    Domenic Polite, MD [facility] Page via [email or web address], password [email or web address] After 7PM please contact night-coverage  02/02/2018, 3:28 PM

## 2018-02-02 NOTE — Progress Notes (Signed)
NTS patient did not receive any secretions.  Pt tolerated well.SPO2 96% on NRB

## 2018-02-03 LAB — CBC
HEMATOCRIT: 37.3 % (ref 36.0–46.0)
Hemoglobin: 11.5 g/dL — ABNORMAL LOW (ref 12.0–15.0)
MCH: 29.1 pg (ref 26.0–34.0)
MCHC: 30.8 g/dL (ref 30.0–36.0)
MCV: 94.4 fL (ref 78.0–100.0)
PLATELETS: 166 10*3/uL (ref 150–400)
RBC: 3.95 MIL/uL (ref 3.87–5.11)
RDW: 16.3 % — AB (ref 11.5–15.5)
WBC: 20.1 10*3/uL — AB (ref 4.0–10.5)

## 2018-02-03 LAB — PROCALCITONIN: Procalcitonin: 2.97 ng/mL

## 2018-02-03 LAB — GLUCOSE, CAPILLARY
GLUCOSE-CAPILLARY: 167 mg/dL — AB (ref 65–99)
GLUCOSE-CAPILLARY: 144 mg/dL — AB (ref 65–99)
Glucose-Capillary: 168 mg/dL — ABNORMAL HIGH (ref 65–99)
Glucose-Capillary: 170 mg/dL — ABNORMAL HIGH (ref 65–99)
Glucose-Capillary: 150 mg/dL — ABNORMAL HIGH (ref 65–99)

## 2018-02-03 LAB — BASIC METABOLIC PANEL
ANION GAP: 11 (ref 5–15)
BUN: 48 mg/dL — ABNORMAL HIGH (ref 6–20)
CO2: 19 mmol/L — AB (ref 22–32)
Calcium: 8.9 mg/dL (ref 8.9–10.3)
Chloride: 114 mmol/L — ABNORMAL HIGH (ref 101–111)
Creatinine, Ser: 1.55 mg/dL — ABNORMAL HIGH (ref 0.44–1.00)
GFR, EST AFRICAN AMERICAN: 34 mL/min — AB (ref >60)
GFR, EST NON AFRICAN AMERICAN: 29 mL/min — AB (ref >60)
Glucose, Bld: 165 mg/dL — ABNORMAL HIGH (ref 65–99)
POTASSIUM: 3.5 mmol/L (ref 3.5–5.1)
Sodium: 144 mmol/L (ref 135–145)

## 2018-02-03 LAB — HEPARIN LEVEL (UNFRACTIONATED)
HEPARIN UNFRACTIONATED: 0.32 [IU]/mL (ref 0.30–0.70)
Heparin Unfractionated: 0.11 IU/mL — ABNORMAL LOW (ref 0.30–0.70)

## 2018-02-03 LAB — LACTIC ACID, PLASMA: Lactic Acid, Venous: 1.6 mmol/L (ref 0.5–1.9)

## 2018-02-03 MED ORDER — SODIUM CHLORIDE 0.9 % IV SOLN: 1.0000 g | INTRAVENOUS | Status: DC

## 2018-02-03 MED ORDER — SODIUM CHLORIDE 0.9 % IV SOLN
500.0000 mg | Freq: Two times a day (BID) | INTRAVENOUS | Status: DC
  Administered 2018-02-03 – 2018-02-05 (×6): 500 mg via INTRAVENOUS
  Filled 2018-02-03 (×7): qty 0.5

## 2018-02-03 NOTE — Progress Notes (Addendum)
Pharmacy Antibiotic Note  Anna [name] is a 84 y.o. female admitted on 01/30/2018 with pneumonia.  Pharmacy has been consulted for meropenem dosing with hx of ESBL. Urine culture growing sensitive Kleb pneumo. SCr has risen to 1.55.  Plan: Reduce meropenem to 500mg  IV q12h Monitor LOT, blood cultures, renal funx  Height: 5\' 5"  (165.1 cm) Weight: 138 lb 7.2 oz (62.8 kg) IBW/kg (Calculated) : 57  Temp (24hrs), Avg:98.1 F (36.7 C), Min:97.2 F (36.2 C), Max:99.3 F (37.4 C)  Recent Labs  Lab 01/30/18 1635  01/31/18 1205  01/31/18 1649 02/01/18 0127 02/01/18 0502 02/01/18 0658 02/01/18 1228 02/02/18 0548 02/02/18 0703 02/03/18 0239  WBC 9.8  --  26.2*  --   --   --   --   --  27.6* 18.8*  --  20.1*  CREATININE 1.18*  --  1.10*  --  1.00  --   --   --  1.23*  --  1.32* 1.55*  LATICACIDVEN  --    < >  --    < > 3.2* 2.2* 2.2* 1.9  --  2.7*  --  1.6   < > = values in this interval not displayed.    Estimated Creatinine Clearance: 23 mL/min (A) (by C-G formula based on SCr of 1.55 mg/dL (H)).    Allergies  Allergen Reactions  . Pollen Extract     Sneezing, watery eyes    Antimicrobials: CTX 6/11 x1 Unasyn 6/12 x1 Azith 6/11>>6/12 Zosyn 6/12 x2 doses Merrem 6/13>>  Microbiology: 6/12 BCx: ngtd 6/11 UCx: 40K kleb pneumo (R to amp); 80K lactobacillus 6/12 MRSA PCR: negative  Thank you for allowing pharmacy to be a part of this patient's care.  Arrie Senate, PharmD, BCPS PGY-2 Cardiology Pharmacy Resident Phone: [phone] 02/03/2018

## 2018-02-03 NOTE — Progress Notes (Signed)
PROGRESS NOTE    Anna [name]  XBM:[id number] DOB: Jul 13, 1929 DOA: 01/30/2018 PCP: Dorothyann Peng, NP  Brief Narrative: Anna [name] is a 84 year old chronically ill female with history of advanced dementia, chronic systolic CHF with EF of 02%, chronic atrial fibrillation not on anticoagulation, chronic sacral wound, diabetes mellitus was admitted on 6/11 with lethargy. -She was found to have acute and chronic pulmonary emboli, supportive mesenteric artery thrombosis, bilateral lower lobe pneumonia, atrial fibrillation with RVR. -Continues to remain unresponsive, seen by vascular surgery last p.m., was taken to the OR and had a unsuccessful attempt at mechanical thrombectomy, the thrombus was felt to be well organized suggesting a more chronic appearance.  -Remains unresponsive, on IV heparin, Abx for aspiration pneumonia -ECHO with worsening EF to 20% now from 35-40% previously  Assessment & Plan:     Superior mesenteric artery thrombosis, - nearly occlusive thrombus, continues to have very tender abd exam, consistent with ongoing bowel ischemia/infarction, lactate increased -remains on IV heparin, had an unsuccessful attempt at mechanical thrombectomy 6/12 -She is not a surgical candidate -Prognosis very poor, due to ongoing bowel infarction, now worsening renal function/very poor urine output, aspiration pneumonia, metabolic encephalopathy, advanced dementia -she is DNR, except for BIPAP -palliative medicine consulting, will continue to reiterate to the.daughter that her prognosis is extremely poor to survive this hospitalization -most appropriate for comfort measures only -vascular surgery has signed off, nothing further to add from their standpoint   AKI -worsening urine output since admission and she is 7L positive -hold off on additional IVF, with increasing hypoxia  Acute and chronic pulmonary emboli -CT angiogram 6/12 noted acute/subacute pulmonary embolism in the segmental  and subsegmental left upper lobe pulmonary artery branches and evidence of chronic PE in right upper lobe -Continue heparin drip -She is at risk of bleeding especially from bowel ischemia, not appropriate to transition to alternative anticoagulation at this time  Aspiration pneumonia -remains on IV meropenem, blood cultures negative -She is not alert enough for SLP evaluation -History well suggestive of long-standing dysphagia prior to admission -worsening hypoxia could be from mucous plug and a component of volume overload, cut down IV fluids  Metabolic encephalopathy -Multifactorial, due to aspiration pneumonia, possible bowel ischemia -nothing by mouth, blood cultures negative, urine culture with 40,000 Escherichia coli and 80,000 lactobacillus  Atrial fibrillation with RVR -remains on IV Cardizem -also on IV heparin for acute PE and SMA thrombosis -cardiology signed off, prognosis very poor  Advanced dementia -Daughter in significant denial -Mumbles yes and no at baseline, Mostly bedbound  Chronic systolic CHF -EF 72% -continue half-normal saline at 50 mL an hour now while nothing by mouth  Diabetes mellitus -CBG stable, continue sliding scale insulin -If she becomes hypoglycemic will change IV fluids to D5 half-normal saline  Global: continues to remain critically ill, minimally responsive, I think she is near terminal -Do not think she will survive this hospitalization -plan for Palliative Meeting again tomorrow  DVT prophylaxis: Heparin gtt Code Status: No Code Blue/No CPR/No intubation but ok with BIPAP Family Communication: no family at bedside today will touch base with son and daughter Disposition Plan: remain in ICU  Consultants:   VVS  Cards  PCCM  Procedures: Procedure:   #1: Ultrasound-guided access, right femoral artery                         #2: Abdominal aortogram                         #  3: First order catheterization, superior mesenteric artery                          #4: Superior mesenteric artery angiogram                         #5: Catheter directed administration of TPA into the superior mesenteric artery                         #6: Mechanical thrombectomy, superior mesenteric artery Findings: The thrombus was well organized, suggesting a more chronic appearance.  Unsuccessful attempt at mechanical thrombectomy     Antimicrobials:    Subjective: -increased O2 needs this morning  Objective: Vitals:   02/03/18 1100 02/03/18 1115 02/03/18 1134 02/03/18 1200  BP: 122/75   109/68  Pulse:      Resp: (!) 28 (!) 28  (!) 28  Temp:   98.7 F (37.1 C)   TempSrc:   Oral   SpO2: 100% 100%  100%  Weight:      Height:        Intake/Output Summary (Last 24 hours) at 02/03/2018 1206 Last data filed at 02/03/2018 1200 Gross per 24 hour  Intake 1531.48 ml  Output 515 ml  Net 1016.48 ml   Filed Weights   01/31/18 0630 01/31/18 2300  Weight: 63.6 kg (140 lb 3.4 oz) 62.8 kg (138 lb 7.2 oz)    Examination:  General exam:  Gen: obtunded, winces to pain, chronically ill elderly female HEENT: pupils sluggish but reactive Lungs: conducted upper airway sounds, bilateral crackles as well CVS: W1-X9/[id number], question systolic murmur Abd: soft, slightly distended, diffusely tender, bowel sounds decreased Extremities: 1+ edema Skin: no new rashes,  sacral wound quarter sized Psychiatry:unable to assess   Data Reviewed:   CBC: Recent Labs  Lab 01/30/18 1635 01/31/18 1205 01/31/18 2205 02/01/18 1228 02/02/18 0548 02/03/18 0239  WBC 9.8 26.2*  --  27.6* 18.8* 20.1*  HGB 15.9* 15.7* 14.6 14.4 14.0 11.5*  HCT 48.8* 48.5* 43.0 45.7 43.6 37.3  MCV 94.8 94.4  --  93.8 91.8 94.4  PLT 267 251  --  209 PLATELET CLUMPS NOTED ON SMEAR, UNABLE TO ESTIMATE 130   Basic Metabolic Panel: Recent Labs  Lab 01/31/18 1205 01/31/18 1649 01/31/18 2205 02/01/18 1228 02/02/18 0703 02/03/18 0239  NA 149* 143 149* 147* 142 144  K  3.8 3.3* 2.8* 3.7 4.4 3.5  CL 116* 115*  --  118* 117* 114*  CO2 18* 19*  --  17* 16* 19*  GLUCOSE 361* 294* 197* 187* 91 165*  BUN 34* 33*  --  33* 43* 48*  CREATININE 1.10* 1.00  --  1.23* 1.32* 1.55*  CALCIUM 9.7 9.1  --  9.1 8.8* 8.9   GFR: Estimated Creatinine Clearance: 23 mL/min (A) (by C-G formula based on SCr of 1.55 mg/dL (H)). Liver Function Tests: Recent Labs  Lab 01/30/18 1649  AST 25  ALT 13*  ALKPHOS 82  BILITOT 0.8  PROT 7.3  ALBUMIN 4.0   No results for input(s): LIPASE, AMYLASE in the last 168 hours. No results for input(s): AMMONIA in the last 168 hours. Coagulation Profile: No results for input(s): INR, PROTIME in the last 168 hours. Cardiac Enzymes: Recent Labs  Lab 01/31/18 1418 02/01/18 0127 02/01/18 0636 02/01/18 1228  TROPONINI 0.21* 0.20* 0.25* 0.13*   BNP (last 3 results) No results for  input(s): PROBNP in the last 8760 hours. HbA1C: No results for input(s): HGBA1C in the last 72 hours. CBG: Recent Labs  Lab 02/02/18 1945 02/02/18 2338 02/03/18 0355 02/03/18 0741 02/03/18 1132  GLUCAP 214* 161* 167* 168* 170*   Lipid Profile: No results for input(s): CHOL, HDL, LDLCALC, TRIG, CHOLHDL, LDLDIRECT in the last 72 hours. Thyroid Function Tests: No results for input(s): TSH, T4TOTAL, FREET4, T3FREE, THYROIDAB in the last 72 hours. Anemia Panel: No results for input(s): VITAMINB12, FOLATE, FERRITIN, TIBC, IRON, RETICCTPCT in the last 72 hours. Urine analysis:    Component Value Date/Time   COLORURINE YELLOW 01/30/2018 2009   APPEARANCEUR CLEAR 01/30/2018 2009   LABSPEC 1.016 01/30/2018 2009   PHURINE 5.0 01/30/2018 2009   GLUCOSEU NEGATIVE 01/30/2018 2009   HGBUR NEGATIVE 01/30/2018 2009   BILIRUBINUR NEGATIVE 01/30/2018 2009   BILIRUBINUR Negative 02/11/2017 1234   KETONESUR 5 (A) 01/30/2018 2009   PROTEINUR NEGATIVE 01/30/2018 2009   UROBILINOGEN 0.2 02/11/2017 1234   UROBILINOGEN 0.2 01/27/2015 1353   NITRITE NEGATIVE  01/30/2018 2009   LEUKOCYTESUR LARGE (A) 01/30/2018 2009   Sepsis Labs: @LABRCNTIP (procalcitonin:4,lacticidven:4)  ) Recent Results (from the past 240 hour(s))  Urine culture     Status: Abnormal   Collection Time: 01/30/18  8:32 PM  Result Value Ref Range Status   Specimen Description   Final    URINE, RANDOM Performed at [facility], [place] [address], [place], [place] [address]    Special Requests   Final    NONE Performed at [facility], [place] [address], [place], [place] [address]    Culture (A)  Final    40,000 COLONIES/mL KLEBSIELLA PNEUMONIAE 80,000 COLONIES/mL LACTOBACILLUS SPECIES Standardized susceptibility testing for this organism is not available. Performed at [facility], [place] [address], [place], [place] [address]    Report Status 02/02/2018 FINAL  Final   Organism ID, Bacteria KLEBSIELLA PNEUMONIAE (A)  Final      Susceptibility   Klebsiella pneumoniae - MIC*    AMPICILLIN RESISTANT Resistant     CEFAZOLIN <=4 SENSITIVE Sensitive     CEFTRIAXONE <=1 SENSITIVE Sensitive     CIPROFLOXACIN <=0.25 SENSITIVE Sensitive     GENTAMICIN <=1 SENSITIVE Sensitive     IMIPENEM <=0.25 SENSITIVE Sensitive     NITROFURANTOIN 32 SENSITIVE Sensitive     TRIMETH/SULFA <=20 SENSITIVE Sensitive     AMPICILLIN/SULBACTAM 4 SENSITIVE Sensitive     PIP/TAZO <=4 SENSITIVE Sensitive     Extended ESBL NEGATIVE Sensitive     * 40,000 COLONIES/mL KLEBSIELLA PNEUMONIAE  MRSA PCR Screening     Status: None   Collection Time: 01/31/18  6:48 AM  Result Value Ref Range Status   MRSA by PCR NEGATIVE NEGATIVE Final    Comment:        The GeneXpert MRSA Assay (FDA approved for NASAL specimens only), is one component of a comprehensive MRSA colonization surveillance program. It is not intended to diagnose MRSA infection nor to guide or monitor treatment for MRSA infections. Performed at [facility], [place] [address],  [place], [place] [address]   Culture, blood (routine x 2)     Status: None (Preliminary result)   Collection Time: 01/31/18  6:56 AM  Result Value Ref Range Status   Specimen Description   Final    BLOOD RIGHT HAND Performed at [facility] [address], [place], [place] [address]    Special Requests   Final  BOTTLES DRAWN AEROBIC AND ANAEROBIC Blood Culture adequate volume Performed at [facility] [address] [place], [place] [address]    Culture   Final    NO GROWTH 2 DAYS Performed at [facility] [address] [place], [place] [address]    Report Status PENDING  Incomplete  Culture, blood (routine x 2)     Status: None (Preliminary result)   Collection Time: 01/31/18  6:56 AM  Result Value Ref Range Status   Specimen Description   Final    BLOOD LEFT HAND Performed at [facility] [address], [place], [place] [address]    Special Requests   Final    BOTTLES DRAWN AEROBIC AND ANAEROBIC Blood Culture adequate volume Performed at [facility] [address], [place], [place] [address]    Culture   Final    NO GROWTH 2 DAYS Performed at [facility] [address] [place],  [address]    Report Status PENDING  Incomplete         Radiology Studies: Dg Chest Port 1 View  Result Date: 02/02/2018 CLINICAL DATA:  Hypoxia EXAM: PORTABLE CHEST 1 VIEW COMPARISON:  Chest radiograph January 30, 2018 and chest CT January 31, 2018 FINDINGS: There is no appreciable edema or consolidation. There is cardiomegaly with pulmonary vascularity within normal limits. No adenopathy. Bones are osteoporotic. IMPRESSION: Stable cardiac prominence.  No edema or consolidation. Electronically Signed   By: Lowella Grip III M.D.   On: 02/02/2018 09:18        Scheduled Meds: . chlorhexidine  15 mL Mouth Rinse BID  . insulin aspart  0-15 Units Subcutaneous Q4H  . ipratropium-albuterol  3  mL Nebulization Q6H  . mouth rinse  15 mL Mouth Rinse BID  . mouth rinse  15 mL Mouth Rinse q12n4p   Continuous Infusions: . sodium chloride 10 mL/hr at 02/03/18 1200  . diltiazem (CARDIZEM) infusion 5 mg/hr (02/03/18 1200)  . heparin 1,300 Units/hr (02/03/18 1200)  . meropenem (MERREM) IV Stopped (02/03/18 1030)     LOS: 4 days    Time spent: 29min    Domenic Polite, MD [facility] Page via [email or web address], password [email or web address] After 7PM please contact night-coverage  02/03/2018, 12:06 PM

## 2018-02-03 NOTE — Progress Notes (Signed)
ANTICOAGULATION CONSULT NOTE - Follow Up Consult  Pharmacy Consult for heparin Indication: PE, Afib, and mesenteric artery thrombosis  Labs: Recent Labs    01/31/18 1649  02/01/18 0127  02/01/18 0636 02/01/18 1228 02/02/18 0548 02/02/18 0703 02/02/18 1456 02/03/18 0239  HGB  --    < >  --   --   --  14.4 14.0  --   --  11.5*  HCT  --    < >  --   --   --  45.7 43.6  --   --  37.3  PLT  --   --   --   --   --  209 PLATELET CLUMPS NOTED ON SMEAR, UNABLE TO ESTIMATE  --   --  166  HEPARINUNFRC  --   --   --    < > 0.55 0.55 0.24*  --  0.29* 0.11*  CREATININE 1.00  --   --   --   --  1.23*  --  1.32*  --   --   TROPONINI  --   --  0.20*  --  0.25* 0.13*  --   --   --   --    < > = values in this interval not displayed.    Assessment: 84yo female subtherapeutic on heparin with substantially lower heparin level despite rate increase last pm; of note Hgb has also dropped; RN notes no overt signs of bleeding, IV has good blood return and flushes well.  Goal of Therapy:  Heparin level 0.3-0.7 units/ml   Plan:  Will increase heparin gtt cautiously to 1300 units/hr and check level in 8 hours.    Wynona Neat, PharmD, BCPS  02/03/2018,4:56 AM

## 2018-02-03 NOTE — Progress Notes (Signed)
Daily Progress Note   Patient Name: Anna [name]       Date: 02/03/2018 DOB: 04/03/1929  Age: 84 y.o. MRN#: [id number] Attending Physician: Anna Polite, MD Primary Care Physician: Anna Peng, NP Admit Date: 01/30/2018  Reason for Consultation/Follow-up: Establishing goals of care and Psychosocial/spiritual support  Subjective: Patient seen, chart reviewed.  Met with patient's daughters Anna [name] Anna [name] is healthcare POA), Anna [name] who is from [place] (Anna [name] will be in town for the next 5 days), as well as son, Anna [name], who has flown in from [place] with plans to return Monday night.  Anna [name] does do most of the talking for the family and appears to have done a good job of keeping them informed as to her mother's current clinical condition as well as acuity of condition.  Anna [name] verbalizes  that her mother is "more responsive" today.  Both she and Anna [name], remarked that patient did open her eyes to their voice one time and Anna [name] remarked that her mother coughed one time.  This is viewed as some improvement in their eyes particularly Terry's, in addition to white blood cell count now 20.1, and lactic acid now 1.6.  We had a lengthy conversation regarding the importance of not only looking at the data but also how [name] is doing.  As noted above, Anna [name] is verbalizing that she thinks her mother is more alert.  Anna [name] is struggling with her mother's illness and wants to feel that "I done everything I can do".  We did talk about at some point in the next few days, [name], will declare herself and that we could be looking at end-of-life. Anna [name] becomes tearful, states she knows this but wishes to continue with antibiotics, Cardizem drip, as well as BiPAP if needed.  I did share with family that BiPAP  can rapidly turn into life support once started especially in a critically ill patient.  Anna [name] shares she wants that to continue so   if she were not at the bedside, it may provide time for her to reach her mother's bedside if she were eminently dying    Length of Stay: 4  Current Medications: Scheduled Meds:  . chlorhexidine  15 mL Mouth Rinse BID  . insulin aspart  0-15 Units Subcutaneous Q4H  . ipratropium-albuterol  3 mL Nebulization Q6H  .  mouth rinse  15 mL Mouth Rinse BID  . mouth rinse  15 mL Mouth Rinse q12n4p    Continuous Infusions: . sodium chloride 10 mL/hr at 02/03/18 1225  . diltiazem (CARDIZEM) infusion 5 mg/hr (02/03/18 1200)  . heparin 1,300 Units/hr (02/03/18 1200)  . meropenem (MERREM) IV Stopped (02/03/18 1030)    PRN Meds: acetaminophen, bisacodyl, metoprolol tartrate, ondansetron **OR** ondansetron (ZOFRAN) IV  Physical Exam  Constitutional:  Frail, acutely ill appearing elderly female, seen in ICU.  She is unresponsive to light touch and voice  HENT:  Head: Normocephalic and atraumatic.  Cardiovascular:  Tachycardic On Cardizem drip   Pulmonary/Chest:  O2 sats in the 80s.  Patient now on a Ventimask.  No increased work of breathing noted at rest, respiratory rate 20-24  Abdominal:  Tender to light touch  Genitourinary:  Genitourinary Comments: Foley catheter  Neurological:  Unresponsive to voice and light touch  Skin: Skin is warm and dry. There is pallor.  Psychiatric:  No overt agitation otherwise unable to test  Nursing note and vitals reviewed.           Vital Signs: BP 109/68   Pulse (!) 101   Temp 98.7 F (37.1 C) (Oral)   Resp (!) 28   Ht _0  (1.651 m)   Wt 62.8 kg (138 lb 7.2 oz)   SpO2 100%   BMI 23.04 kg/m  SpO2: SpO2: 100 % O2 Device: O2 Device: Venturi Mask O2 Flow Rate: O2 Flow Rate (L/min): 6 L/min  Intake/output summary:   Intake/Output Summary (Last 24 hours) at 02/03/2018 1236 Last data filed at 02/03/2018  1200 Gross per 24 hour  Intake 1531.48 ml  Output 515 ml  Net 1016.48 ml   LBM: Last BM Date: 01/31/18 Baseline Weight: Weight: 63.6 kg (140 lb 3.4 oz) Most recent weight: Weight: 62.8 kg (138 lb 7.2 oz)       Palliative Assessment/Data:      Patient Active Problem List   Diagnosis Date Noted  . DNR (do not resuscitate) discussion   . Palliative care by specialist   . Unresponsiveness   . Pressure injury of skin 01/31/2018  . Mesenteric artery thrombosis ([place]) 01/31/2018  . Pneumonia 01/30/2018  . Acute metabolic encephalopathy 55/73/2202  . Alzheimer's dementia with behavioral disturbance 07/10/2017  . Pilonidal abscess 07/10/2017  . Abscess of coccyx ([place]) 07/10/2017  . Abnormal urine sediment 02/11/2017  . Upper airway cough syndrome 01/06/2017  . Acute on chronic diastolic heart failure ([place])   . Atrial fibrillation ([place]) 09/15/2016  . Atrial fibrillation with RVR ([place]) 09/15/2016  . Near syncope 02/05/2016  . Pre-syncope 02/05/2016  . Chest pain   . Diabetes mellitus with complication ([place])   . Hypokalemia 08/28/2015  . Lower urinary tract infectious disease 08/26/2015  . Generalized weakness 08/26/2015  . Acute combined systolic and diastolic heart failure ([place]) 02/16/2015  . Hyperthyroidism 01/28/2015  . Dyspnea 01/27/2015  . SOB (shortness of breath) 01/27/2015  . Failure to thrive in adult 01/27/2015  . Benign essential HTN 01/27/2015  . DM (diabetes mellitus) type II controlled with renal manifestation ([place]) 01/27/2015  . CKD (chronic kidney disease), stage III ([place]) 01/27/2015    Palliative Care Assessment & Plan   Patient Profile: 84 y.o. female   admitted on 01/30/2018 with past medical history significantfor Alzhemiersdementia,systolic and diastolic CHF, RK2,HCWCBJ fibrillation,was brought to the ED via EMS with reports of diaphoresis with clamminess and lethargy. She was found to have acute on chronic pulmonary  emboli,  SMA  thrombosis,  bilateral lower lobe pneumonia, atrial fibrillation with RVR. She was taken to the OR and had a unsuccessful attempt at mechanical thrombectomy, the thrombus was felt to be well organized suggesting a more chronic appearance. 2/2 to her multiple co-morbidites and advanced age patient has limited treatment options per vascular surgery.\  Consult ordered for [place]  The family wants to do everything possible, for the next few days, specifically IV abx, Cardizem gtt, heparin, as well as bipap. Confirmed that she does not want CPR, defibrillation, intubation or pressors in the event of clinical decline   Recommendations/Plan:  Continue current plan of care unchanged.  Family still engaged in hopeful watchful waiting over the next few days but they do appear to be gaining insight into that this may be end of life  Medicine to continue to support family and patient  Code Status:    Code Status Orders  (From admission, onward)        Start     Ordered   02/01/18 0022  Limited resuscitation (code)  Continuous    Question Answer Comment  In the event of cardiac or respiratory ARREST: Initiate Code Blue, Call Rapid Response No   In the event of cardiac or respiratory ARREST: Perform CPR No   In the event of cardiac or respiratory ARREST: Perform Intubation/Mechanical Ventilation No   In the event of cardiac or respiratory ARREST: Use NIPPV/BiPAp only if indicated Yes   In the event of cardiac or respiratory ARREST: Administer ACLS medications if indicated No   In the event of cardiac or respiratory ARREST: Perform Defibrillation or Cardioversion if indicated No      02/01/18 0021    Code Status History    Date Active Date Inactive Code Status Order ID Comments User Context   01/31/2018 0559 02/01/2018 0021 Full Code [id number]  Bethena Roys, MD ED   07/10/2017 0347 07/17/2017 1832 Full Code [id number]  Etta Quill, DO ED   09/15/2016 0931 09/17/2016 1939 Full Code [id number]   Mariel Aloe, MD Inpatient   02/05/2016 1623 02/09/2016 1902 Full Code [id number]  Rondel Jumbo, PA-C ED   08/26/2015 2142 09/01/2015 1503 Full Code [id number]  Norval Morton, MD ED   01/27/2015 1534 01/28/2015 2106 Full Code [id number]  Jonetta Osgood, MD Inpatient    Advance Directive Documentation     Most Recent Value  Type of Advance Directive  Healthcare Power of Attorney  Pre-existing out of facility DNR order (yellow form or pink MOST form)  -  "MOST" Form in Place?  -       Prognosis:   < 2 weeks in the setting of ischemic bowel, worsening systolic heart failure with an ejection fraction of 20%, ongoing encephalopathy  Discharge Planning:  To Be Determined    Thank you for allowing the Palliative Medicine Team to assist in the care of this patient.   Time In: 1015 Time Out: 1130 Total Time 75 min Prolonged Time Billed  yes       Greater than 50%  of this time was spent counseling and coordinating care related to the above assessment and plan.  Dory Horn, NP  Please contact Palliative Medicine Team phone at [phone] for questions and concerns.

## 2018-02-03 NOTE — Progress Notes (Signed)
[place] for Heparin Indication:Pulmonary embolus & Mesenteric Artery thrombosis, and afib  Allergies  Allergen Reactions  . Pollen Extract     Sneezing, watery eyes   Patient Measurements: Height: 5\' 5"  (165.1 cm) Weight: 138 lb 7.2 oz (62.8 kg) IBW/kg (Calculated) : 57 Heparin Dosing Weight: 63.6 kg  Vital Signs: Temp: 98.7 F (37.1 C) (06/15 1134) Temp Source: Oral (06/15 1134) BP: 109/68 (06/15 1200) Pulse Rate: 101 (06/15 0800)  Labs: Recent Labs    02/01/18 0127  02/01/18 0636 02/01/18 1228 02/02/18 0548 02/02/18 0703 02/02/18 1456 02/03/18 0239 02/03/18 1257  HGB  --   --   --  14.4 14.0  --   --  11.5*  --   HCT  --   --   --  45.7 43.6  --   --  37.3  --   PLT  --   --   --  209 PLATELET CLUMPS NOTED ON SMEAR, UNABLE TO ESTIMATE  --   --  166  --   HEPARINUNFRC  --    < > 0.55 0.55 0.24*  --  0.29* 0.11* 0.32  CREATININE  --   --   --  1.23*  --  1.32*  --  1.55*  --   TROPONINI 0.20*  --  0.25* 0.13*  --   --   --   --   --    < > = values in this interval not displayed.   Estimated Creatinine Clearance: 23 mL/min (A) (by C-G formula based on SCr of 1.55 mg/dL (H)).  Assessment:  84 yo Female with PE and mesenteric artery thrombosis for heparin. S/p OR w/ TPA but unsuccessful attempt at mechanical thrombectomy.  No po anticoagulation due to ischemic bowel  Heparin therapeutic today at 0.32, Hgb down this am but per RN no overt S/Sx bleeding.  Goal of Therapy:  Heparin level 0.3-0.7 units/ml Monitor platelets by anticoagulation protocol: Yes   Plan:  -Continue heparin 1300 units/hr. -Recheck heparin level with AM labs -Daily heparin level and CBC  Arrie Senate, PharmD, BCPS PGY-2 Cardiology Pharmacy Resident Phone: [phone] 02/03/2018

## 2018-02-04 DIAGNOSIS — Z7189 Other specified counseling: Secondary | ICD-10-CM

## 2018-02-04 LAB — GLUCOSE, CAPILLARY
GLUCOSE-CAPILLARY: 122 mg/dL — AB (ref 65–99)
GLUCOSE-CAPILLARY: 171 mg/dL — AB (ref 65–99)
GLUCOSE-CAPILLARY: 128 mg/dL — AB (ref 65–99)
Glucose-Capillary: 105 mg/dL — ABNORMAL HIGH (ref 65–99)
Glucose-Capillary: 122 mg/dL — ABNORMAL HIGH (ref 65–99)
Glucose-Capillary: 165 mg/dL — ABNORMAL HIGH (ref 65–99)
Glucose-Capillary: 98 mg/dL (ref 65–99)

## 2018-02-04 LAB — CBC
HEMATOCRIT: 37.3 % (ref 36.0–46.0)
Hemoglobin: 11.6 g/dL — ABNORMAL LOW (ref 12.0–15.0)
MCH: 29.3 pg (ref 26.0–34.0)
MCHC: 31.1 g/dL (ref 30.0–36.0)
MCV: 94.2 fL (ref 78.0–100.0)
PLATELETS: 158 10*3/uL (ref 150–400)
RBC: 3.96 MIL/uL (ref 3.87–5.11)
RDW: 16.3 % — AB (ref 11.5–15.5)
WBC: 13.8 10*3/uL — ABNORMAL HIGH (ref 4.0–10.5)

## 2018-02-04 LAB — LACTIC ACID, PLASMA: Lactic Acid, Venous: 1.2 mmol/L (ref 0.5–1.9)

## 2018-02-04 LAB — HEPARIN LEVEL (UNFRACTIONATED)
HEPARIN UNFRACTIONATED: 0.32 [IU]/mL (ref 0.30–0.70)
Heparin Unfractionated: 0.23 IU/mL — ABNORMAL LOW (ref 0.30–0.70)

## 2018-02-04 MED ORDER — IPRATROPIUM-ALBUTEROL 0.5-2.5 (3) MG/3ML IN SOLN
3.0000 mL | Freq: Three times a day (TID) | RESPIRATORY_TRACT | Status: DC
  Administered 2018-02-04 – 2018-02-06 (×7): 3 mL via RESPIRATORY_TRACT
  Filled 2018-02-04 (×7): qty 3

## 2018-02-04 NOTE — Progress Notes (Signed)
ANTICOAGULATION CONSULT NOTE - Follow Up Consult  Pharmacy Consult for heparin Indication: PE, Afib, and mesenteric artery thrombosis  Labs: Recent Labs    02/01/18 0636  02/01/18 1228 02/02/18 0548 02/02/18 0703  02/03/18 0239 02/03/18 1257 02/04/18 0224  HGB  --    < > 14.4 14.0  --   --  11.5*  --  11.6*  HCT  --    < > 45.7 43.6  --   --  37.3  --  37.3  PLT  --    < > 209 PLATELET CLUMPS NOTED ON SMEAR, UNABLE TO ESTIMATE  --   --  166  --  158  HEPARINUNFRC 0.55  --  0.55 0.24*  --    < > 0.11* 0.32 0.23*  CREATININE  --   --  1.23*  --  1.32*  --  1.55*  --   --   TROPONINI 0.25*  --  0.13*  --   --   --   --   --   --    < > = values in this interval not displayed.    Assessment: 84yo female subtherapeutic on heparin after one level at lower end of goal; Hgb remains lower but stable, no signs of bleeding per RN.  Goal of Therapy:  Heparin level 0.3-0.7 units/ml   Plan:  Will increase heparin gtt by 1-2 units/kg/hr to 1400 units/hr and check level in 8 hours.    Wynona Neat, PharmD, BCPS  02/04/2018,3:35 AM

## 2018-02-04 NOTE — Progress Notes (Signed)
Pt's SpO2 64% on 4L venturi mask at rest. RNx2 at the bedside. NRB at 15L, 100% fiO2 initiated, pt placed in high fowlers position. RR 35, HR 110-120 AFib, BP 133/93. SpO2 100% after several minutes. Coarse crackles heard throughout lung fields. Dr. Broadus John paged and updated. Verbal orders given to place pt on Bipap if RR does not decrease within the next 20-42mins.

## 2018-02-04 NOTE — Progress Notes (Addendum)
[facility] for Heparin Indication:Pulmonary embolus & Mesenteric Artery thrombosis, and afib  Allergies  Allergen Reactions  . Pollen Extract     Sneezing, watery eyes   Patient Measurements: Height: 5\' 5"  (165.1 cm) Weight: 138 lb 7.2 oz (62.8 kg) IBW/kg (Calculated) : 57 Heparin Dosing Weight: 63.6 kg  Vital Signs: Temp: 99.1 F (37.3 C) (06/16 1137) Temp Source: Oral (06/16 1137) BP: 134/82 (06/16 1100) Pulse Rate: 102 (06/16 0800)  Labs: Recent Labs    02/01/18 1228 02/02/18 0548 02/02/18 0703  02/03/18 0239 02/03/18 1257 02/04/18 0224 02/04/18 1059  HGB 14.4 14.0  --   --  11.5*  --  11.6*  --   HCT 45.7 43.6  --   --  37.3  --  37.3  --   PLT 209 PLATELET CLUMPS NOTED ON SMEAR, UNABLE TO ESTIMATE  --   --  166  --  158  --   HEPARINUNFRC 0.55 0.24*  --    < > 0.11* 0.32 0.23* 0.32  CREATININE 1.23*  --  1.32*  --  1.55*  --   --   --   TROPONINI 0.13*  --   --   --   --   --   --   --    < > = values in this interval not displayed.   Estimated Creatinine Clearance: 23 mL/min (A) (by C-G formula based on SCr of 1.55 mg/dL (H)).  Assessment:  84 yo Female with PE and mesenteric artery thrombosis for heparin. S/p OR w/ TPA but unsuccessful attempt at mechanical thrombectomy.  No po anticoagulation due to ischemic bowel  Heparin therapeutic today at 0.32, Hgb stable, no overt S/Sx bleeding documented.  Goal of Therapy:  Heparin level 0.3-0.7 units/ml Monitor platelets by anticoagulation protocol: Yes   Plan:  -Continue heparin 1400 units/hr -Recheck heparin level with AM labs -Daily heparin level and CBC  Arrie Senate, PharmD, BCPS PGY-2 Cardiology Pharmacy Resident Phone: [phone] 02/04/2018

## 2018-02-04 NOTE — Progress Notes (Signed)
   Patient evaluated with presence of daughter.  She continues to have belly pain.  There is no further intervention from a vascular surgery standpoint.  Would continue heparin as long as there is no bleeding risk.  Noted palliative care plans for continued aggressive medical care without CPR defibrillation or intubation.  Karyss Frese C. Donzetta Matters, MD [facility] Office: [phone] Pager: [phone]

## 2018-02-04 NOTE — Progress Notes (Signed)
PROGRESS NOTE    Anna [name]  HQP:[id number] DOB: Jan 25, 1929 DOA: 01/30/2018 PCP: Anna Peng, NP  Brief Narrative: Anna [name] is a 84 year old chronically ill female with history of advanced dementia, chronic systolic CHF with EF of 59%, chronic atrial fibrillation not on anticoagulation, chronic sacral wound, diabetes mellitus was admitted on 6/11 with lethargy. -She was found to have acute and chronic pulmonary emboli, supportive mesenteric artery thrombosis, bilateral lower lobe pneumonia, atrial fibrillation with RVR. -Continues to remain unresponsive, seen by vascular surgery last p.m., was taken to the OR and had a unsuccessful attempt at mechanical thrombectomy, the thrombus was felt to be well organized suggesting a more chronic appearance.  -Remains poorly responsive, on IV heparin, Abx for aspiration pneumonia -ECHO with worsening EF to 20% now from 35-40% previously  Assessment & Plan:     Superior mesenteric artery thrombosis, - nearly occlusive thrombus, continues to have very tender abd exam, consistent with ongoing bowel ischemia/infarction -remains on IV heparin, had an unsuccessful attempt at mechanical thrombectomy 6/12 -She is not a surgical candidate, nothing further to add from Vascular standpoint now -Prognosis very poor, due to ongoing bowel infarction, now worsening renal function/very poor urine output, aspiration pneumonia, metabolic encephalopathy, advanced dementia -she is DNR, except for BIPAP -palliative medicine following, most appropriate for Comfort Measures only   AKI -worsening urine output, she is 7L positive -stopped additional IVF due to s/s of volume overload, resume fluids if resp status improves  Acute and chronic pulmonary emboli -CT angiogram 6/12 noted acute/subacute pulmonary embolism in the segmental and subsegmental left upper lobe pulmonary artery branches and evidence of chronic PE in right upper lobe -remains on heparin  drip -She is at risk of bleeding especially from bowel ischemia, not appropriate to transition to alternative anticoagulation at this time  Aspiration pneumonia -remains on IV meropenem-Day 5, blood cultures negative -She is not alert enough for SLP evaluation -History well suggestive of long-standing dysphagia prior to admission  Metabolic encephalopathy -Multifactorial, due to aspiration pneumonia, possible bowel ischemia -nothing by mouth, blood cultures negative, urine culture with 40,000 Escherichia coli and 80,000 lactobacillus -mentation a bit better  Atrial fibrillation with RVR -remains on IV Cardizem -also on IV heparin for acute PE and SMA thrombosis  Advanced dementia -Daughter in significant denial -[place]umbles yes and no at baseline, Mostly bedbound  Chronic systolic CHF -EF dropped from 35-40% to 20% -IVF stopped, mild VOl overload -Cards signed off, nothing further to add, Poor prognosis  Diabetes mellitus -CBG stable, continue sliding scale insulin -If she becomes hypoglycemic will add D5  Global: continues to remain critically ill,  I think she is near terminal -Do not expect a good outcome from this hospitalization, needs to be Comfort Care -Palliative following, continue to work with this family, esp daughter Anna [name]  DVT prophylaxis: Heparin gtt Code Status: No Code Blue/No CPR/No intubation but ok with BIPAP Family Communication: other daughter at bedside today Disposition Plan: remain in ICU  Consultants:   VVS  Cards  PCCM  Palliative medicine  Procedures: Procedure:   #1: Ultrasound-guided access, right femoral artery                         #2: Abdominal aortogram                         #3: First order catheterization, superior mesenteric artery                         #  4: Superior mesenteric artery angiogram                         #5: Catheter directed administration of TPA into the superior mesenteric artery                         #6:  Mechanical thrombectomy, superior mesenteric artery Findings: The thrombus was well organized, suggesting a more chronic appearance.  Unsuccessful attempt at mechanical thrombectomy     Antimicrobials:    Subjective: -no significant changes, opens eyes this am  Objective: Vitals:   02/04/18 0948 02/04/18 0954 02/04/18 0955 02/04/18 0958  BP:      Pulse:      Resp: (!) 37 (!) 23 18 (!) 30  Temp:      TempSrc:      SpO2: (!) 84% (!) 88% 92% (!) 79%  Weight:      Height:        Intake/Output Summary (Last 24 hours) at 02/04/2018 1058 Last data filed at 02/04/2018 0900 Gross per 24 hour  Intake 831.22 ml  Output 770 ml  Net 61.22 ml   Filed Weights   01/31/18 0630 01/31/18 2300  Weight: 63.6 kg (140 lb 3.4 oz) 62.8 kg (138 lb 7.2 oz)    Examination:  General exam:  Gen: poorly responsive but opens eyes, winces to pain, chronically ill appearing HEENT: pupils sluggish but reactive Lungs: scattered ronchi and rales at bases CVS: S1S2/Irregularly irregular Abd: soft, slightly distended, tender abdomen, BS diminished but present Extremities: 1+ edema Skin: no new rashes,  sacral wound quarter sized Psychiatry:unable to assess   Data Reviewed:   CBC: Recent Labs  Lab 01/31/18 1205 01/31/18 2205 02/01/18 1228 02/02/18 0548 02/03/18 0239 02/04/18 0224  WBC 26.2*  --  27.6* 18.8* 20.1* 13.8*  HGB 15.7* 14.6 14.4 14.0 11.5* 11.6*  HCT 48.5* 43.0 45.7 43.6 37.3 37.3  MCV 94.4  --  93.8 91.8 94.4 94.2  PLT 251  --  209 PLATELET CLUMPS NOTED ON SMEAR, UNABLE TO ESTIMATE 166 106   Basic Metabolic Panel: Recent Labs  Lab 01/31/18 1205 01/31/18 1649 01/31/18 2205 02/01/18 1228 02/02/18 0703 02/03/18 0239  NA 149* 143 149* 147* 142 144  K 3.8 3.3* 2.8* 3.7 4.4 3.5  CL 116* 115*  --  118* 117* 114*  CO2 18* 19*  --  17* 16* 19*  GLUCOSE 361* 294* 197* 187* 91 165*  BUN 34* 33*  --  33* 43* 48*  CREATININE 1.10* 1.00  --  1.23* 1.32* 1.55*  CALCIUM 9.7 9.1   --  9.1 8.8* 8.9   GFR: Estimated Creatinine Clearance: 23 mL/min (A) (by C-G formula based on SCr of 1.55 mg/dL (H)). Liver Function Tests: Recent Labs  Lab 01/30/18 1649  AST 25  ALT 13*  ALKPHOS 82  BILITOT 0.8  PROT 7.3  ALBUMIN 4.0   No results for input(s): LIPASE, AMYLASE in the last 168 hours. No results for input(s): AMMONIA in the last 168 hours. Coagulation Profile: No results for input(s): INR, PROTIME in the last 168 hours. Cardiac Enzymes: Recent Labs  Lab 01/31/18 1418 02/01/18 0127 02/01/18 0636 02/01/18 1228  TROPONINI 0.21* 0.20* 0.25* 0.13*   BNP (last 3 results) No results for input(s): PROBNP in the last 8760 hours. HbA1C: No results for input(s): HGBA1C in the last 72 hours. CBG: Recent Labs  Lab 02/03/18 1623 02/03/18 2015 02/04/18 0003  02/04/18 0416 02/04/18 0755  GLUCAP 150* 144* 105* 122* 98   Lipid Profile: No results for input(s): CHOL, HDL, LDLCALC, TRIG, CHOLHDL, LDLDIRECT in the last 72 hours. Thyroid Function Tests: No results for input(s): TSH, T4TOTAL, FREET4, T3FREE, THYROIDAB in the last 72 hours. Anemia Panel: No results for input(s): VITAMINB12, FOLATE, FERRITIN, TIBC, IRON, RETICCTPCT in the last 72 hours. Urine analysis:    Component Value Date/Time   COLORURINE YELLOW 01/30/2018 2009   APPEARANCEUR CLEAR 01/30/2018 2009   LABSPEC 1.016 01/30/2018 2009   PHURINE 5.0 01/30/2018 2009   GLUCOSEU NEGATIVE 01/30/2018 2009   HGBUR NEGATIVE 01/30/2018 2009   BILIRUBINUR NEGATIVE 01/30/2018 2009   BILIRUBINUR Negative 02/11/2017 1234   KETONESUR 5 (A) 01/30/2018 2009   PROTEINUR NEGATIVE 01/30/2018 2009   UROBILINOGEN 0.2 02/11/2017 1234   UROBILINOGEN 0.2 01/27/2015 1353   NITRITE NEGATIVE 01/30/2018 2009   LEUKOCYTESUR LARGE (A) 01/30/2018 2009   Sepsis Labs: @LABRCNTIP (procalcitonin:4,lacticidven:4)  ) Recent Results (from the past 240 hour(s))  Urine culture     Status: Abnormal   Collection Time: 01/30/18   8:32 PM  Result Value Ref Range Status   Specimen Description   Final    URINE, RANDOM Performed at [facility], [place] [address] [place], [place] [address]    Special Requests   Final    NONE Performed at [facility], [place] [address] [place], [place] [address]    Culture (A)  Final    40,000 COLONIES/mL KLEBSIELLA PNEUMONIAE 80,000 COLONIES/mL LACTOBACILLUS SPECIES Standardized susceptibility testing for this organism is not available. Performed at [facility], [place] [address] [place], [place] [address]    Report Status 02/02/2018 FINAL  Final   Organism ID, Bacteria KLEBSIELLA PNEUMONIAE (A)  Final      Susceptibility   Klebsiella pneumoniae - MIC*    AMPICILLIN RESISTANT Resistant     CEFAZOLIN <=4 SENSITIVE Sensitive     CEFTRIAXONE <=1 SENSITIVE Sensitive     CIPROFLOXACIN <=0.25 SENSITIVE Sensitive     GENTAMICIN <=1 SENSITIVE Sensitive     IMIPENEM <=0.25 SENSITIVE Sensitive     NITROFURANTOIN 32 SENSITIVE Sensitive     TRIMETH/SULFA <=20 SENSITIVE Sensitive     AMPICILLIN/SULBACTAM 4 SENSITIVE Sensitive     PIP/TAZO <=4 SENSITIVE Sensitive     Extended ESBL NEGATIVE Sensitive     * 40,000 COLONIES/mL KLEBSIELLA PNEUMONIAE  MRSA PCR Screening     Status: None   Collection Time: 01/31/18  6:48 AM  Result Value Ref Range Status   MRSA by PCR NEGATIVE NEGATIVE Final    Comment:        The GeneXpert MRSA Assay (FDA approved for NASAL specimens only), is one component of a comprehensive MRSA colonization surveillance program. It is not intended to diagnose MRSA infection nor to guide or monitor treatment for MRSA infections. Performed at [facility], [place] [address], [place], [place] [address]   Culture, blood (routine x 2)     Status: None (Preliminary result)   Collection Time: 01/31/18  6:56 AM  Result Value Ref Range Status   Specimen Description   Final    BLOOD RIGHT HAND Performed at [place] [address], [place], [place] [address]    Special Requests   Final    BOTTLES DRAWN AEROBIC AND ANAEROBIC Blood Culture adequate volume Performed at [facility] [address] [place], [place] [address]    Culture  Final    NO GROWTH 3 DAYS Performed at [facility], [place] [address], [place], [place] [address]    Report Status PENDING  Incomplete  Culture, blood (routine x 2)     Status: None (Preliminary result)   Collection Time: 01/31/18  6:56 AM  Result Value Ref Range Status   Specimen Description   Final    BLOOD LEFT HAND Performed at [facility] [address] [place], [place] [address]    Special Requests   Final    BOTTLES DRAWN AEROBIC AND ANAEROBIC Blood Culture adequate volume Performed at [facility] [address], [place], [place] [address]    Culture   Final    NO GROWTH 3 DAYS Performed at [facility], [place] [address] [place], [place] [address]    Report Status PENDING  Incomplete         Radiology Studies: No results found.      Scheduled Meds: . chlorhexidine  15 mL Mouth Rinse BID  . insulin aspart  0-15 Units Subcutaneous Q4H  . ipratropium-albuterol  3 mL Nebulization TID  . mouth rinse  15 mL Mouth Rinse BID  . mouth rinse  15 mL Mouth Rinse q12n4p   Continuous Infusions: . sodium chloride 10 mL/hr at 02/04/18 0800  . diltiazem (CARDIZEM) infusion 5 mg/hr (02/04/18 0900)  . heparin 1,400 Units/hr (02/04/18 0800)  . meropenem (MERREM) IV 500 mg (02/04/18 1045)     LOS: 5 days    Time spent: 81min    Domenic Polite, MD [facility] Page via [email or web address], password [email or web address] After 7PM please contact night-coverage  02/04/2018, 10:58 AM

## 2018-02-05 DIAGNOSIS — R0902 Hypoxemia: Secondary | ICD-10-CM

## 2018-02-05 DIAGNOSIS — Z515 Encounter for palliative care: Secondary | ICD-10-CM

## 2018-02-05 DIAGNOSIS — R4189 Other symptoms and signs involving cognitive functions and awareness: Secondary | ICD-10-CM

## 2018-02-05 LAB — CBC
HEMATOCRIT: 41.3 % (ref 36.0–46.0)
Hemoglobin: 12.7 g/dL (ref 12.0–15.0)
MCH: 28.8 pg (ref 26.0–34.0)
MCHC: 30.8 g/dL (ref 30.0–36.0)
MCV: 93.7 fL (ref 78.0–100.0)
Platelets: 185 10*3/uL (ref 150–400)
RBC: 4.41 MIL/uL (ref 3.87–5.11)
RDW: 16.3 % — AB (ref 11.5–15.5)
WBC: 16.8 10*3/uL — ABNORMAL HIGH (ref 4.0–10.5)

## 2018-02-05 LAB — CULTURE, BLOOD (ROUTINE X 2)
Culture: NO GROWTH
Culture: NO GROWTH
Special Requests: ADEQUATE
Special Requests: ADEQUATE

## 2018-02-05 LAB — BASIC METABOLIC PANEL
ANION GAP: 8 (ref 5–15)
BUN: 47 mg/dL — ABNORMAL HIGH (ref 6–20)
CHLORIDE: 118 mmol/L — AB (ref 101–111)
CO2: 18 mmol/L — AB (ref 22–32)
Calcium: 9.2 mg/dL (ref 8.9–10.3)
Creatinine, Ser: 1.15 mg/dL — ABNORMAL HIGH (ref 0.44–1.00)
GFR calc Af Amer: 48 mL/min — ABNORMAL LOW (ref >60)
GFR calc non Af Amer: 42 mL/min — ABNORMAL LOW (ref >60)
Glucose, Bld: 148 mg/dL — ABNORMAL HIGH (ref 65–99)
POTASSIUM: 4.1 mmol/L (ref 3.5–5.1)
Sodium: 144 mmol/L (ref 135–145)

## 2018-02-05 LAB — GLUCOSE, CAPILLARY
GLUCOSE-CAPILLARY: 163 mg/dL — AB (ref 65–99)
GLUCOSE-CAPILLARY: 163 mg/dL — AB (ref 65–99)
Glucose-Capillary: 122 mg/dL — ABNORMAL HIGH (ref 65–99)
Glucose-Capillary: 127 mg/dL — ABNORMAL HIGH (ref 65–99)
Glucose-Capillary: 133 mg/dL — ABNORMAL HIGH (ref 65–99)
Glucose-Capillary: 83 mg/dL (ref 65–99)

## 2018-02-05 LAB — HEPARIN LEVEL (UNFRACTIONATED): Heparin Unfractionated: 0.28 IU/mL — ABNORMAL LOW (ref 0.30–0.70)

## 2018-02-05 LAB — LACTIC ACID, PLASMA: Lactic Acid, Venous: 1.4 mmol/L (ref 0.5–1.9)

## 2018-02-05 MED ORDER — BISACODYL 10 MG RE SUPP
10.0000 mg | Freq: Every day | RECTAL | Status: DC | PRN
  Administered 2018-02-05: 10 mg via RECTAL
  Filled 2018-02-05: qty 1

## 2018-02-05 NOTE — Consult Note (Signed)
[facility] Nurse wound consult note Reason for Consult:requeste to re-evaluate wound. Patient with Stage 4 pressure injury POA, cared for at home by her daughter. Followed by the wound care center.  Daughter performs meticulous wound care at home. Patient is on a low air loss mattress at home per daughter's report. Has HHRN that comes in for NPWT dressings, however during this admission we have not used the NPWT dressing. Wound type: Stage 4 pressure injury Pressure Injury POA: Yes Measurement: 2cm x 1.5cm x 1.0cm  Wound LJQ:GBEEF pink base, non granular. Chronic appearance with epibole of the wound edges, mostly from 6-12 o'clock. She has new areas of darken tissue consistent with deep tissue injury surrounds the wound. The patient is on NRB at 15L and BiPAP at night.  She is mostly non responsive Drainage (amount, consistency, odor) minimal, no odor Periwound:macerated, daughter uses creams around the wound, may be trapping moisture to macerate tissue Dressing procedure/placement/frequency: Continue with POC that is in place per wound care center, patient's daughter is concerned with the darkening of the tissues surrounding the wound. Clinically this patient has failure of her other organs requiring respiratory support currently. I feel that the worsening of her wound is related to her overall decline and with the skin being the largest organ is not functioning as well. She is not eating also which can contribute to the worsening of a wound. While turning and repositioning the patient are very important there are a combination of factors that would contribute to this decline.  She is currently on a low air loss mattress ICU bed. Should she transfer out of the ICU will need air mattress replacement if on the floor.   [facility] Nurse team will follow along with you for weekly wound assessments.  Please notify me of any acute changes in the wounds or any new areas of concerns  [place] MSN, RN,CWOCN, [place],  [place]

## 2018-02-05 NOTE — Progress Notes (Signed)
PROGRESS NOTE    Anna [name]  IEP:[id number] DOB: Aug 20, 1929 DOA: 01/30/2018 PCP: Anna Peng, NP  Brief Narrative: Anna [name] is a 84 year old chronically ill female with history of advanced dementia, chronic systolic CHF with EF of 66%, chronic atrial fibrillation not on anticoagulation, chronic sacral wound, diabetes mellitus was admitted on 6/11 with lethargy. -She was found to have acute and chronic pulmonary emboli, supportive mesenteric artery thrombosis, bilateral lower lobe pneumonia, atrial fibrillation with RVR. -Continues to remain poorly responsive, seen by vascular surgery last p.m., was taken to the OR and had a unsuccessful attempt at mechanical thrombectomy, the thrombus was felt to be well organized suggesting a more chronic appearance.  -Remains poorly responsive, on IV heparin, Abx for aspiration pneumonia -ECHO with worsening EF to 20% now from 35-40% previously -Palliative medicine following, no signs of meaningful recovery  Assessment & Plan:     Superior mesenteric artery thrombosis, - nearly occlusive thrombus, continues to have very tender abd exam, consistent with ongoing bowel ischemia/infarction -remains on IV heparin, had an unsuccessful attempt at mechanical thrombectomy 6/12 -She is not a surgical candidate, nothing further to add from Vascular standpoint now -Prognosis very poor, due to ongoing bowel infarction, encephalopahy, aspiration pneumonia, metabolic encephalopathy, advanced dementia -she is DNR, except for BIPAP -palliative medicine following, most appropriate for Comfort Measures only -continue to reiterate our recommendation to family   AKI -worsening urine output, she is 7L positive -stopped additional IVF due to s/s of volume overload, resume fluids if resp status improves  Acute and chronic pulmonary emboli -CT angiogram 6/12 noted acute/subacute pulmonary embolism in the segmental and subsegmental left upper lobe pulmonary artery  branches and evidence of chronic PE in right upper lobe -remains on heparin drip -She is at risk of bleeding especially from bowel ischemia, not appropriate to transition to alternative anticoagulation at this time  Aspiration pneumonia -remains on IV meropenem-Day 7, blood cultures negative -She is not alert enough for SLP evaluation -History well suggestive of long-standing dysphagia prior to admission -stop Abx after tomorrow's dose  Metabolic encephalopathy -Multifactorial, due to aspiration pneumonia, possible bowel ischemia -nothing by mouth, blood cultures negative, urine culture with 40,000 Escherichia coli and 80,000 lactobacillus -mentation a bit better -needs comfort care, daughter/PoA in denial -do not think she can swallow safely, will ask SLP to eval, just to re-emphasize this to family -I have told family numerous times that she is NOT APPROPRIATE FOR NG tube feeds or PEG tube feeds  Atrial fibrillation with RVR -remains on IV Cardizem -also on IV heparin for acute PE and SMA thrombosis  Advanced dementia -Daughter in significant denial -Mumbles yes and no at baseline, Mostly bedbound  Chronic systolic CHF EF now 06% -EF dropped from 35-40% to 20% -IVF stopped, mild VOl overload -Cards signed off, nothing further to add, Poor prognosis  Diabetes mellitus -CBG stable, continue sliding scale insulin -If she becomes hypoglycemic will add D5  Global: continues to remain critically ill,  I think she is near terminal -Do not expect a good outcome from this hospitalization, needs to be Comfort Care -Palliative following, continue to work with this family, esp daughter Anna [name]  DVT prophylaxis: Heparin gtt Code Status: No Code Blue/No CPR/No intubation but ok with BIPAP Family Communication: discussed with both daughters today Disposition Plan: remain in ICU  Consultants:   VVS  Cards  PCCM  Palliative medicine  Procedures: Procedure:   #1: Ultrasound-guided  access, right femoral artery                         #  2: Abdominal aortogram                         #3: First order catheterization, superior mesenteric artery                         #4: Superior mesenteric artery angiogram                         #5: Catheter directed administration of TPA into the superior mesenteric artery                         #6: Mechanical thrombectomy, superior mesenteric artery Findings: The thrombus was well organized, suggesting a more chronic appearance.  Unsuccessful attempt at mechanical thrombectomy     Antimicrobials:    Subjective: -on BIPAP this morning, was on NRM just before this  Objective: Vitals:   02/05/18 0800 02/05/18 0855 02/05/18 0900 02/05/18 1100  BP: (!) 129/96 (!) 129/96 134/74 116/74  Pulse:  (!) 113    Resp: (!) 38 (!) 32 (!) 33   Temp:    98.5 F (36.9 C)  TempSrc:    Oral  SpO2: 99% 100% 99%   Weight:      Height:        Intake/Output Summary (Last 24 hours) at 02/05/2018 1345 Last data filed at 02/05/2018 0900 Gross per 24 hour  Intake 665.57 ml  Output 570 ml  Net 95.57 ml   Filed Weights   01/31/18 0630 01/31/18 2300 02/05/18 0600  Weight: 63.6 kg (140 lb 3.4 oz) 62.8 kg (138 lb 7.2 oz) 64 kg (141 lb 1.5 oz)    Examination:   Gen: obtunded, poorly responsive but opens eyes, winces to pain, chronically ill-appearing HEENT: pupil sluggish but reactive Lungs: scattered rhonchi and Rales at both bases CVS: S1-S2/irregularly irregular Abd: soft, distended, tender abdomen, bowel sounds diminished Extremities: trace edema Skin: no new rashes, sacral wound quarter sized Psychiatry:unable to assess   Data Reviewed:   CBC: Recent Labs  Lab 02/01/18 1228 02/02/18 0548 02/03/18 0239 02/04/18 0224 02/05/18 0331  WBC 27.6* 18.8* 20.1* 13.8* 16.8*  HGB 14.4 14.0 11.5* 11.6* 12.7  HCT 45.7 43.6 37.3 37.3 41.3  MCV 93.8 91.8 94.4 94.2 93.7  PLT 209 PLATELET CLUMPS NOTED ON SMEAR, UNABLE TO ESTIMATE 166 158  010   Basic Metabolic Panel: Recent Labs  Lab 01/31/18 1649 01/31/18 2205 02/01/18 1228 02/02/18 0703 02/03/18 0239 02/05/18 0823  NA 143 149* 147* 142 144 144  K 3.3* 2.8* 3.7 4.4 3.5 4.1  CL 115*  --  118* 117* 114* 118*  CO2 19*  --  17* 16* 19* 18*  GLUCOSE 294* 197* 187* 91 165* 148*  BUN 33*  --  33* 43* 48* 47*  CREATININE 1.00  --  1.23* 1.32* 1.55* 1.15*  CALCIUM 9.1  --  9.1 8.8* 8.9 9.2   GFR: Estimated Creatinine Clearance: 31 mL/min (A) (by C-G formula based on SCr of 1.15 mg/dL (H)). Liver Function Tests: Recent Labs  Lab 01/30/18 1649  AST 25  ALT 13*  ALKPHOS 82  BILITOT 0.8  PROT 7.3  ALBUMIN 4.0   No results for input(s): LIPASE, AMYLASE in the last 168 hours. No results for input(s): AMMONIA in the last 168 hours. Coagulation Profile: No results for input(s): INR, PROTIME in the last 168 hours. Cardiac Enzymes: Recent Labs  Lab 01/31/18 1418 02/01/18 0127 02/01/18 0636 02/01/18 1228  TROPONINI 0.21* 0.20* 0.25* 0.13*   BNP (last 3 results) No results for input(s): PROBNP in the last 8760 hours. HbA1C: No results for input(s): HGBA1C in the last 72 hours. CBG: Recent Labs  Lab 02/04/18 1934 02/04/18 2328 02/05/18 0338 02/05/18 0742 02/05/18 1151  GLUCAP 128* 122* 133* 122* 163*   Lipid Profile: No results for input(s): CHOL, HDL, LDLCALC, TRIG, CHOLHDL, LDLDIRECT in the last 72 hours. Thyroid Function Tests: No results for input(s): TSH, T4TOTAL, FREET4, T3FREE, THYROIDAB in the last 72 hours. Anemia Panel: No results for input(s): VITAMINB12, FOLATE, FERRITIN, TIBC, IRON, RETICCTPCT in the last 72 hours. Urine analysis:    Component Value Date/Time   COLORURINE YELLOW 01/30/2018 2009   APPEARANCEUR CLEAR 01/30/2018 2009   LABSPEC 1.016 01/30/2018 2009   PHURINE 5.0 01/30/2018 2009   GLUCOSEU NEGATIVE 01/30/2018 2009   HGBUR NEGATIVE 01/30/2018 2009   BILIRUBINUR NEGATIVE 01/30/2018 2009   BILIRUBINUR Negative 02/11/2017  1234   KETONESUR 5 (A) 01/30/2018 2009   PROTEINUR NEGATIVE 01/30/2018 2009   UROBILINOGEN 0.2 02/11/2017 1234   UROBILINOGEN 0.2 01/27/2015 1353   NITRITE NEGATIVE 01/30/2018 2009   LEUKOCYTESUR LARGE (A) 01/30/2018 2009   Sepsis Labs: @LABRCNTIP (procalcitonin:4,lacticidven:4)  ) Recent Results (from the past 240 hour(s))  Urine culture     Status: Abnormal   Collection Time: 01/30/18  8:32 PM  Result Value Ref Range Status   Specimen Description   Final    URINE, RANDOM Performed at [place] [address] [place], [place] [address]    Special Requests   Final    NONE Performed at [facility], [place] [address], [place], [place] [address]    Culture (A)  Final    40,000 COLONIES/mL KLEBSIELLA PNEUMONIAE 80,000 COLONIES/mL LACTOBACILLUS SPECIES Standardized susceptibility testing for this organism is not available. Performed at [facility], [place] [address], [place], [place] [address]    Report Status 02/02/2018 FINAL  Final   Organism ID, Bacteria KLEBSIELLA PNEUMONIAE (A)  Final      Susceptibility   Klebsiella pneumoniae - MIC*    AMPICILLIN RESISTANT Resistant     CEFAZOLIN <=4 SENSITIVE Sensitive     CEFTRIAXONE <=1 SENSITIVE Sensitive     CIPROFLOXACIN <=0.25 SENSITIVE Sensitive     GENTAMICIN <=1 SENSITIVE Sensitive     IMIPENEM <=0.25 SENSITIVE Sensitive     NITROFURANTOIN 32 SENSITIVE Sensitive     TRIMETH/SULFA <=20 SENSITIVE Sensitive     AMPICILLIN/SULBACTAM 4 SENSITIVE Sensitive     PIP/TAZO <=4 SENSITIVE Sensitive     Extended ESBL NEGATIVE Sensitive     * 40,000 COLONIES/mL KLEBSIELLA PNEUMONIAE  MRSA PCR Screening     Status: None   Collection Time: 01/31/18  6:48 AM  Result Value Ref Range Status   MRSA by PCR NEGATIVE NEGATIVE Final    Comment:        The GeneXpert MRSA Assay (FDA approved for NASAL specimens only), is one component of a comprehensive MRSA colonization surveillance program. It is  not intended to diagnose MRSA infection nor to guide or monitor treatment for MRSA infections. Performed at [facility], [place] [address] [place], [place] [address]   Culture, blood (routine x 2)     Status: None (Preliminary result)   Collection Time: 01/31/18  6:56 AM  Result Value Ref Range Status   Specimen Description   Final    BLOOD RIGHT  HAND Performed at [facility], [place] [address] [place], [place] [address]    Special Requests   Final    BOTTLES DRAWN AEROBIC AND ANAEROBIC Blood Culture adequate volume Performed at [place] [address], [place], [place] [address]    Culture   Final    NO GROWTH 4 DAYS Performed at [facility], [place] [address], [place], [place] [address]    Report Status PENDING  Incomplete  Culture, blood (routine x 2)     Status: None (Preliminary result)   Collection Time: 01/31/18  6:56 AM  Result Value Ref Range Status   Specimen Description   Final    BLOOD LEFT HAND Performed at [place] [address] [place], [place] [address]    Special Requests   Final    BOTTLES DRAWN AEROBIC AND ANAEROBIC Blood Culture adequate volume Performed at [place] [address], [place], [place] [address]    Culture   Final    NO GROWTH 4 DAYS Performed at [facility], [place] [address], [place],  [address]    Report Status PENDING  Incomplete         Radiology Studies: No results found.      Scheduled Meds: . chlorhexidine  15 mL Mouth Rinse BID  . insulin aspart  0-15 Units Subcutaneous Q4H  . ipratropium-albuterol  3 mL Nebulization TID  . mouth rinse  15 mL Mouth Rinse BID  . mouth rinse  15 mL Mouth Rinse q12n4p   Continuous Infusions: . sodium chloride 10 mL/hr at 02/05/18 0700  . diltiazem (CARDIZEM) infusion 5 mg/hr (02/05/18 0700)  . heparin 1,450 Units/hr (02/05/18 0700)  . meropenem (MERREM) IV  Stopped (02/05/18 1217)     LOS: 6 days    Time spent: 45min    Domenic Polite, MD [facility] Page via [email or web address], password [email or web address] After 7PM please contact night-coverage  02/05/2018, 1:45 PM

## 2018-02-05 NOTE — Progress Notes (Signed)
Patient ID: Anna [name], female   DOB: 05-16-1929, 84 y.o.   MRN: [id number]  This NP visited patient at the bedside as a follow up for palliative needs and emotional support.  Patient is currently on BiPap, and minimally unresponsive.  Daughter at bedside is visibly anxious.  She is  verbalizing  her concerns regarding her mother's care.  She expressively speaks to her perspective that her mother "is more responsive and making eye contact"   " I just want her pneumonia treated and to get her to eat a little something so that I can take her home"  "I will take her home with hospice if we can get her there".  She is requesting f/u visit from wound care, will order.  We discussed the patient's high risk for continued decompensation, aspiration and the fact that no one would be surprised if she pasted at any time.  She is very vulnerable, her body is demonstrating a failure to thrive situation.   We discussed the difference between an aggressive medical interventions path and a palliative comfort path for this patient at this time.   Unfortunately, I do not believe that her daughter has insight into this likley EOL situation, and that prognosis is likely hours to days, with or without medical  interventions.  Discussed with daughter/[name] the importance of continued conversation with her family and their  medical providers regarding overall plan of care and treatment options,  ensuring decisions are within the context of the patients values, and best interest and GOCs.  Questions and concerns addressed   Discussed with Clarene Critchley RN  Time in 0800          Time out  0835  Total time spent on the unit was 35 minutes  PMT will continue to support holistically.   Greater than 50% of the time was spent in counseling and coordination of care  Wadie Lessen NP  Palliative Medicine Team Team Phone # [phone] Pager [phone]

## 2018-02-05 NOTE — Progress Notes (Signed)
RT called by RN due to patient dasating. Upon arrival to room RT found patient on non-rebreather mask with sats in the 70's. RT placed patient on bipap 14/6 and 70% FIO2. Sats are now 94% and vitals are stable. RT will continue to monitor.

## 2018-02-05 NOTE — Progress Notes (Signed)
FIO2 increased to 70% to maintain sats.

## 2018-02-05 NOTE — Plan of Care (Signed)
  Problem: Skin Integrity: Goal: Risk for impaired skin integrity will decrease Outcome: Not Progressing  Pt with large non healing wound to sacral Problem: Respiratory: Goal: Ability to maintain adequate ventilation will improve Outcome: Not Progressing  Pt requiring 50% Venti mask and occasional NTS to maintain O2 level

## 2018-02-05 NOTE — Progress Notes (Signed)
This note also relates to the following rows which could not be included: SpO2 - Cannot attach notes to unvalidated device data  Patient removed from bipap and placed on NRB mask at 15L. Sats are 100% and vitals are stable. RT will continue to monitor.

## 2018-02-05 NOTE — Progress Notes (Signed)
[place] for Heparin Indication:Pulmonary embolus & Mesenteric Artery thrombosis, and afib  Allergies  Allergen Reactions  . Pollen Extract     Sneezing, watery eyes   Patient Measurements: Height: 5\' 5"  (165.1 cm) Weight: 138 lb 7.2 oz (62.8 kg) IBW/kg (Calculated) : 57 Heparin Dosing Weight: 63.6 kg  Vital Signs: Temp: 98.6 F (37 C) (06/17 0300) Temp Source: Axillary (06/17 0300) BP: 109/63 (06/17 0100) Pulse Rate: 103 (06/16 1936)  Labs: Recent Labs    02/02/18 0703  02/03/18 0239  02/04/18 0224 02/04/18 1059 02/05/18 0331  HGB  --   --  11.5*  --  11.6*  --  12.7  HCT  --   --  37.3  --  37.3  --  41.3  PLT  --   --  166  --  158  --  185  HEPARINUNFRC  --    < > 0.11*   < > 0.23* 0.32 0.28*  CREATININE 1.32*  --  1.55*  --   --   --   --    < > = values in this interval not displayed.   Estimated Creatinine Clearance: 23 mL/min (A) (by C-G formula based on SCr of 1.55 mg/dL (H)).  Assessment:  84 yo Female with PE and mesenteric artery thrombosis for heparin. S/p OR w/ TPA but unsuccessful attempt at mechanical thrombectomy.  No po anticoagulation due to ischemic bowel  Heparin therapeutic today 0.28 unit/ml, Hgb stable, no overt S/Sx bleeding documented.  Goal of Therapy:  Heparin level 0.3-0.7 units/ml Monitor platelets by anticoagulation protocol: Yes   Plan:  -Increase heparin 1450 units/hr -Daily heparin level and CBC  Thanks for allowing pharmacy to be a part of this patient's care.  Excell Seltzer, PharmD Clinical Pharmacist 02/05/2018

## 2018-02-06 ENCOUNTER — Telehealth: Payer: Self-pay | Admitting: *Deleted

## 2018-02-06 LAB — GLUCOSE, CAPILLARY
GLUCOSE-CAPILLARY: 122 mg/dL — AB (ref 65–99)
GLUCOSE-CAPILLARY: 193 mg/dL — AB (ref 65–99)
Glucose-Capillary: 131 mg/dL — ABNORMAL HIGH (ref 65–99)
Glucose-Capillary: 176 mg/dL — ABNORMAL HIGH (ref 65–99)
Glucose-Capillary: 185 mg/dL — ABNORMAL HIGH (ref 65–99)
Glucose-Capillary: 186 mg/dL — ABNORMAL HIGH (ref 65–99)

## 2018-02-06 LAB — CBC
HCT: 41.4 % (ref 36.0–46.0)
Hemoglobin: 12.7 g/dL (ref 12.0–15.0)
MCH: 29.1 pg (ref 26.0–34.0)
MCHC: 30.7 g/dL (ref 30.0–36.0)
MCV: 95 fL (ref 78.0–100.0)
PLATELETS: 198 10*3/uL (ref 150–400)
RBC: 4.36 MIL/uL (ref 3.87–5.11)
RDW: 16.3 % — AB (ref 11.5–15.5)
WBC: 13.4 10*3/uL — AB (ref 4.0–10.5)

## 2018-02-06 LAB — BASIC METABOLIC PANEL
Anion gap: 10 (ref 5–15)
BUN: 55 mg/dL — AB (ref 6–20)
CALCIUM: 9.2 mg/dL (ref 8.9–10.3)
CO2: 19 mmol/L — ABNORMAL LOW (ref 22–32)
CREATININE: 1.21 mg/dL — AB (ref 0.44–1.00)
Chloride: 119 mmol/L — ABNORMAL HIGH (ref 101–111)
GFR calc Af Amer: 45 mL/min — ABNORMAL LOW (ref >60)
GFR, EST NON AFRICAN AMERICAN: 39 mL/min — AB (ref >60)
Glucose, Bld: 110 mg/dL — ABNORMAL HIGH (ref 65–99)
Potassium: 4.1 mmol/L (ref 3.5–5.1)
SODIUM: 148 mmol/L — AB (ref 135–145)

## 2018-02-06 LAB — LACTIC ACID, PLASMA: LACTIC ACID, VENOUS: 1.3 mmol/L (ref 0.5–1.9)

## 2018-02-06 LAB — HEPARIN LEVEL (UNFRACTIONATED): HEPARIN UNFRACTIONATED: 0.52 [IU]/mL (ref 0.30–0.70)

## 2018-02-06 MED ORDER — HYDROMORPHONE HCL 1 MG/ML IJ SOLN
0.5000 mg | INTRAMUSCULAR | Status: DC | PRN
  Administered 2018-02-06 – 2018-02-07 (×2): 0.5 mg via INTRAVENOUS
  Filled 2018-02-06 (×2): qty 0.5

## 2018-02-06 MED ORDER — GLYCOPYRROLATE 0.2 MG/ML IJ SOLN: 0.2000 mg | INTRAMUSCULAR | Status: DC | PRN

## 2018-02-06 MED ORDER — IPRATROPIUM-ALBUTEROL 0.5-2.5 (3) MG/3ML IN SOLN
3.0000 mL | Freq: Four times a day (QID) | RESPIRATORY_TRACT | Status: DC | PRN
Start: 2018-02-06 — End: 2018-02-07

## 2018-02-06 MED ORDER — LORAZEPAM 2 MG/ML IJ SOLN: 0.5000 mg | INTRAMUSCULAR | Status: DC | PRN

## 2018-02-06 NOTE — Progress Notes (Signed)
Family requested for CBG to be checked as they view that as a way of comfort. MD paged and made aware new orders received will continue to monitor.

## 2018-02-06 NOTE — Telephone Encounter (Signed)
Copied from [place] [phone]. Topic: Inquiry >> Feb 06, 2018 11:36 AM Oliver Pila B wrote: Reason for CRM: pt's daughter called and asked for misty or the nurse working w/ Tommi Rumps today to call her back asap, no info about the call given

## 2018-02-06 NOTE — Progress Notes (Signed)
PROGRESS NOTE    COTI BURD  MEQ:[id number] DOB: 12-27-1928 DOA: 01/30/2018 PCP: Dorothyann Peng, NP  Brief Narrative: Anna [name] is a 84 year old chronically ill female with history of advanced dementia, chronic systolic CHF with EF of 29%, chronic atrial fibrillation not on anticoagulation, chronic sacral wound, diabetes mellitus was admitted on 6/11 with lethargy. -She was found to have acute and chronic pulmonary emboli, Superior mesenteric artery thrombosis, bilateral lower lobe pneumonia, atrial fibrillation with RVR. -Continues to remain poorly responsive, seen by vascular surgery on admission., was taken to the OR and had a unsuccessful attempt at mechanical thrombectomy, the thrombus was felt to be well organized suggesting a more chronic appearance., then transferred to ICU at [facility] under [facility] service from [facility] poorly responsive, on IV heparin, Abx for aspiration pneumonia -ECHO with worsening EF to 20% now from 35-40% previously -Palliative medicine following, no signs of meaningful recovery  Assessment & Plan:     Superior mesenteric artery thrombosis, - nearly occlusive thrombus, continues to have very tender abd exam, consistent with ongoing bowel ischemia/infarction -remains on IV heparin, had an unsuccessful attempt at mechanical thrombectomy 6/12 -She is not a surgical candidate, nothing further to add from Vascular standpoint now -Prognosis very poor, due to ongoing bowel infarction, encephalopahy, aspiration pneumonia, metabolic encephalopathy, advanced dementia -she is DNR, except for BIPAP -palliative medicine following, most appropriate for Comfort Measures only -I have continued to tell the patients daughter's that we dont anticipate a good outcome from where, dont expect meaningful recovery and recommend, COMFORT MEASURES only -Daughter Anna [name] continues to expect improvement, I have told her that PEG/NG tube and tube feeds would not be pursued  -Daughter  finally talking about taking mother home with Hospice   AKI -kidney function was worsening, appears to have stabilized now  Acute and chronic pulmonary emboli -CT angiogram 6/12 noted acute/subacute pulmonary embolism in the segmental and subsegmental left upper lobe pulmonary artery branches and evidence of chronic PE in right upper lobe -remains on heparin drip -She is at risk of bleeding especially from bowel ischemia, not appropriate to transition to alternative anticoagulation at this time -Stop Heparin when comfort care pursued  Aspiration pneumonia -completed 7days of IV Meropenem, blood cultures negative -She is not alert enough for SLP evaluation, SLP following -History well suggestive of long-standing dysphagia prior to admission -educated both daughters that no role for indefinite antibiotics in the setting of aspiration pneumonia, completed 7 days of meropenem, stop antibiotics today -Prognosis is very poor regardless  Metabolic encephalopathy -Multifactorial, due to aspiration pneumonia, Bowel ischemia, dehydration -nothing by mouth, blood cultures negative, urine culture with 40,000 Escherichia coli and 80,000 lactobacillus -had to stop IV fluids when she became volume overloaded, especially in light of her worsening CHF and drop in EF to 20% now -needs comfort care, daughter/PoA in denial, but starting to see the inevitable  -do not think she can swallow safely,  SLP following, not safe for PO feeds now -I have told family numerous times that she is NOT APPROPRIATE FOR NG tube feeds or PEG tube feeds  Atrial fibrillation with RVR -remains on IV Cardizem -also on IV heparin for acute PE and SMA thrombosis  Advanced dementia -Daughter in significant denial -nods her head and mumbles yes/no at baseline, Mostly bedbound  Chronic systolic CHF EF now 79% -EF dropped from 35-40% to 20% -IVF stopped, mild VOl overload -Cards signed off, nothing further to add, Poor  prognosis  Diabetes mellitus -CBG stable, continue sliding  scale insulin -If she becomes hypoglycemic will add D5  Global: continues to remain critically ill,  I think she is near terminal -Do not expect a good outcome from this hospitalization, needs to be Comfort Care -Palliative following, continue to work with this family, esp daughter Anna [name]  DVT prophylaxis: Heparin gtt Code Status: No Code Blue/No CPR/No intubation but ok with BIPAP Family Communication: discussed with both daughters today Disposition Plan: home with hospice hopefully soon  Consultants:   VVS  Cards  PCCM  Palliative medicine  Procedures: Procedure:   #1: Ultrasound-guided access, right femoral artery                         #2: Abdominal aortogram                         #3: First order catheterization, superior mesenteric artery                         #4: Superior mesenteric artery angiogram                         #5: Catheter directed administration of TPA into the superior mesenteric artery                         #6: Mechanical thrombectomy, superior mesenteric artery Findings: The thrombus was well organized, suggesting a more chronic appearance.  Unsuccessful attempt at mechanical thrombectomy     Antimicrobials:    Subjective: -on BIPAP this morning, was on NRM just before this  Objective: Vitals:   02/06/18 1100 02/06/18 1200 02/06/18 1300 02/06/18 1400  BP: (!) 105/53 125/80 (!) 104/57 101/68  Pulse:      Resp: (!) 27 (!) 38 (!) 31 (!) 30  Temp: 98 F (36.7 C)     TempSrc: Oral     SpO2: 95% 95% 95% 93%  Weight:      Height:        Intake/Output Summary (Last 24 hours) at 02/06/2018 1447 Last data filed at 02/06/2018 0900 Gross per 24 hour  Intake 1291.91 ml  Output 755 ml  Net 536.91 ml   Filed Weights   01/31/18 0630 01/31/18 2300 02/05/18 0600  Weight: 63.6 kg (140 lb 3.4 oz) 62.8 kg (138 lb 7.2 oz) 64 kg (141 lb 1.5 oz)    Examination:   Gen: obtunded, poorly  responsive but opens eyes, winces to pain, chronically ill-appearing HEENT: pupil sluggish but reactive Lungs: scattered rhonchi and Rales at both bases CVS: S1-S2/irregularly irregular Abd: soft, distended, tender abdomen, bowel sounds diminished Extremities: trace edema Skin: no new rashes, sacral wound quarter sized Psychiatry:unable to assess   Data Reviewed:   CBC: Recent Labs  Lab 02/02/18 0548 02/03/18 0239 02/04/18 0224 02/05/18 0331 02/06/18 0224  WBC 18.8* 20.1* 13.8* 16.8* 13.4*  HGB 14.0 11.5* 11.6* 12.7 12.7  HCT 43.6 37.3 37.3 41.3 41.4  MCV 91.8 94.4 94.2 93.7 95.0  PLT PLATELET CLUMPS NOTED ON SMEAR, UNABLE TO ESTIMATE 166 158 185 628   Basic Metabolic Panel: Recent Labs  Lab 02/01/18 1228 02/02/18 0703 02/03/18 0239 02/05/18 0823 02/06/18 0224  NA 147* 142 144 144 148*  K 3.7 4.4 3.5 4.1 4.1  CL 118* 117* 114* 118* 119*  CO2 17* 16* 19* 18* 19*  GLUCOSE 187* 91 165* 148* 110*  BUN  33* 43* 48* 47* 55*  CREATININE 1.23* 1.32* 1.55* 1.15* 1.21*  CALCIUM 9.1 8.8* 8.9 9.2 9.2   GFR: Estimated Creatinine Clearance: 29.5 mL/min (A) (by C-G formula based on SCr of 1.21 mg/dL (H)). Liver Function Tests: Recent Labs  Lab 01/30/18 1649  AST 25  ALT 13*  ALKPHOS 82  BILITOT 0.8  PROT 7.3  ALBUMIN 4.0   No results for input(s): LIPASE, AMYLASE in the last 168 hours. No results for input(s): AMMONIA in the last 168 hours. Coagulation Profile: No results for input(s): INR, PROTIME in the last 168 hours. Cardiac Enzymes: Recent Labs  Lab 01/31/18 1418 02/01/18 0127 02/01/18 0636 02/01/18 1228  TROPONINI 0.21* 0.20* 0.25* 0.13*   BNP (last 3 results) No results for input(s): PROBNP in the last 8760 hours. HbA1C: No results for input(s): HGBA1C in the last 72 hours. CBG: Recent Labs  Lab 02/05/18 1939 02/05/18 2330 02/06/18 0349 02/06/18 0754 02/06/18 1158  GLUCAP 127* 83 122* 131* 176*   Lipid Profile: No results for input(s): CHOL,  HDL, LDLCALC, TRIG, CHOLHDL, LDLDIRECT in the last 72 hours. Thyroid Function Tests: No results for input(s): TSH, T4TOTAL, FREET4, T3FREE, THYROIDAB in the last 72 hours. Anemia Panel: No results for input(s): VITAMINB12, FOLATE, FERRITIN, TIBC, IRON, RETICCTPCT in the last 72 hours. Urine analysis:    Component Value Date/Time   COLORURINE YELLOW 01/30/2018 2009   APPEARANCEUR CLEAR 01/30/2018 2009   LABSPEC 1.016 01/30/2018 2009   PHURINE 5.0 01/30/2018 2009   GLUCOSEU NEGATIVE 01/30/2018 2009   HGBUR NEGATIVE 01/30/2018 2009   BILIRUBINUR NEGATIVE 01/30/2018 2009   BILIRUBINUR Negative 02/11/2017 1234   KETONESUR 5 (A) 01/30/2018 2009   PROTEINUR NEGATIVE 01/30/2018 2009   UROBILINOGEN 0.2 02/11/2017 1234   UROBILINOGEN 0.2 01/27/2015 1353   NITRITE NEGATIVE 01/30/2018 2009   LEUKOCYTESUR LARGE (A) 01/30/2018 2009   Sepsis Labs: @LABRCNTIP (procalcitonin:4,lacticidven:4)  ) Recent Results (from the past 240 hour(s))  Urine culture     Status: Abnormal   Collection Time: 01/30/18  8:32 PM  Result Value Ref Range Status   Specimen Description   Final    URINE, RANDOM Performed at [facility] [address], [place], [place] [address]    Special Requests   Final    NONE Performed at [facility], [place] [address] [place], [place] [address]    Culture (A)  Final    40,000 COLONIES/mL KLEBSIELLA PNEUMONIAE 80,000 COLONIES/mL LACTOBACILLUS SPECIES Standardized susceptibility testing for this organism is not available. Performed at [facility], [place] [address] [place], [place] [address]    Report Status 02/02/2018 FINAL  Final   Organism ID, Bacteria KLEBSIELLA PNEUMONIAE (A)  Final      Susceptibility   Klebsiella pneumoniae - MIC*    AMPICILLIN RESISTANT Resistant     CEFAZOLIN <=4 SENSITIVE Sensitive     CEFTRIAXONE <=1 SENSITIVE Sensitive     CIPROFLOXACIN <=0.25 SENSITIVE Sensitive     GENTAMICIN <=1 SENSITIVE  Sensitive     IMIPENEM <=0.25 SENSITIVE Sensitive     NITROFURANTOIN 32 SENSITIVE Sensitive     TRIMETH/SULFA <=20 SENSITIVE Sensitive     AMPICILLIN/SULBACTAM 4 SENSITIVE Sensitive     PIP/TAZO <=4 SENSITIVE Sensitive     Extended ESBL NEGATIVE Sensitive     * 40,000 COLONIES/mL KLEBSIELLA PNEUMONIAE  MRSA PCR Screening     Status: None   Collection Time: 01/31/18  6:48 AM  Result Value Ref Range Status   MRSA by  PCR NEGATIVE NEGATIVE Final    Comment:        The GeneXpert MRSA Assay (FDA approved for NASAL specimens only), is one component of a comprehensive MRSA colonization surveillance program. It is not intended to diagnose MRSA infection nor to guide or monitor treatment for MRSA infections. Performed at [facility], [place] [address] [place], [place] [address]   Culture, blood (routine x 2)     Status: None   Collection Time: 01/31/18  6:56 AM  Result Value Ref Range Status   Specimen Description   Final    BLOOD RIGHT HAND Performed at [facility] [address], [place], [place] [address]    Special Requests   Final    BOTTLES DRAWN AEROBIC AND ANAEROBIC Blood Culture adequate volume Performed at [facility] [address] [place], [place] [address]    Culture   Final    NO GROWTH 5 DAYS Performed at [facility], [place] [address] [place], [place] [address]    Report Status 02/05/2018 FINAL  Final  Culture, blood (routine x 2)     Status: None   Collection Time: 01/31/18  6:56 AM  Result Value Ref Range Status   Specimen Description   Final    BLOOD LEFT HAND Performed at [facility] [address] [place], [place] [address]    Special Requests   Final    BOTTLES DRAWN AEROBIC AND ANAEROBIC Blood Culture adequate volume Performed at [facility] [address], [place], [place] [address]    Culture   Final    NO GROWTH 5 DAYS Performed at [facility], [place] [address] [place], [place] [address]    Report Status 02/05/2018 FINAL  Final         Radiology Studies: No results found.      Scheduled Meds: . chlorhexidine  15 mL Mouth Rinse BID  . insulin aspart  0-15 Units Subcutaneous Q4H  . mouth rinse  15 mL Mouth Rinse BID  . mouth rinse  15 mL Mouth Rinse q12n4p   Continuous Infusions: . sodium chloride 10 mL/hr at 02/06/18 0600  . diltiazem (CARDIZEM) infusion 15 mg/hr (02/06/18 0900)  . heparin 1,450 Units/hr (02/06/18 0900)     LOS: 7 days    Time spent: 94min    Domenic Polite, MD [facility] Page via [email or web address], password [email or web address] After 7PM please contact night-coverage  02/06/2018, 2:47 PM

## 2018-02-06 NOTE — Evaluation (Signed)
Clinical/Bedside Swallow Evaluation Patient Details  Name: Anna [name] MRN: [id number] Date of Birth: 1929-03-01  Today's Date: 02/06/2018 Time: SLP Start Time (ACUTE ONLY): 7654 SLP Stop Time (ACUTE ONLY): 0945 SLP Time Calculation (min) (ACUTE ONLY): 28 min  Past Medical History:  Past Medical History:  Diagnosis Date  . Alzheimer's disease   . Atrial fibrillation ([place])   . Bronchitis   . CHF (congestive heart failure) ([place])   . Chronic renal insufficiency, stage III (moderate) ([facility])   . Diabetes mellitus without complication ([place])   . Dyspnea    with exertion  . GERD (gastroesophageal reflux disease)   . GI bleeding 11/2013   due to supratherapeutic INR  . Hyperlipidemia   . Hypertension   . Hyperthyroidism    on amiodarone  . LBBB (left bundle branch block)   . Overactive bladder   . Overweight   . Persistent atrial fibrillation ([place])   . Pneumonia   . Thyroid mass    LLL  . UTI (urinary tract infection)   . Vitamin B12 deficiency   . Vitamin D deficiency    Past Surgical History:  Past Surgical History:  Procedure Laterality Date  . ABDOMINAL HYSTERECTOMY    . AORTOGRAM  01/31/2018   Procedure: AORTOGRAM;  Surgeon: Serafina Mitchell, MD;  Location: [facility];  Service: Vascular;;  . ESOPHAGOGASTRODUODENOSCOPY (EGD) WITH PROPOFOL N/A 02/16/2017   Procedure: ESOPHAGOGASTRODUODENOSCOPY (EGD) WITH PROPOFOL;  Surgeon: Irene Shipper, MD;  Location: [facility];  Service: Endoscopy;  Laterality: N/A;  . IR FLUORO GUIDE CV MIDLINE PICC RIGHT  07/21/2017  . IR US GUIDE VASC ACCESS RIGHT  07/21/2017  . IRRIGATION AND DEBRIDEMENT ABSCESS N/A 07/11/2017   Procedure: IRRIGATION AND DEBRIDEMENT OF SACRAL ABSCESS;  Surgeon: Ralene Ok, MD;  Location: [facility];  Service: General;  Laterality: N/A;  SACRUM  . MESENTERIC ARTERY BYPASS N/A 01/31/2018   Procedure: mechanical thrombectomy of superior mesenteric artery, ultrasound guided access right groin;  Surgeon: Serafina Mitchell,  MD;  Location: [facility];  Service: Vascular;  Laterality: N/A;   HPI:  Anna [name] is a 84 y.o. female with medical history significant for Alzhemiers dementia, systolic and diastolic CHF, DM2, atrial fibrillation admitted with reports of diaphoresis with clamminess and lethargy. Per chart daughter reports choking and coughing for the past few weeks most times when patient tries to drink water, reports no choking episodes with solid food. Portable chest x-ray shows mild patchy left lower lobe opacity-mild atelectasis or infiltrate. BSE 09/15/16 thin and regular recommended.    Assessment / Plan / Recommendation Clinical Impression  Pt lethargic, opens eyes briefly on non rebreather mask and difficulty holding head up. Pt's daughter has adamently requested swallow assessment with her goal being "to see if she can eat something and take her home." SLP present during MD rounding with family and reiterated/educated pt's current state re: swallowing. Oral care provided before ice chip trial with no awareness or response from pt. First trial applesauce fell from oral cavity and second she exhibited minimal and inefficient lingual movement. PO suctioned from oral cavity. Pt's current state appears guarded and continued education with family who desires to have this therapist return alertness improves. Continue NPO, oral care and education with family.    SLP Visit Diagnosis: Dysphagia, oropharyngeal phase (R13.12)    Aspiration Risk  Risk for inadequate nutrition/hydration;Severe aspiration risk    Diet Recommendation NPO(or ice/water for comfort)        Other  Recommendations  Oral Care Recommendations: Oral care QID   Follow up Recommendations None      Frequency and Duration min 1 x/week  1 week       Prognosis Prognosis for Safe Diet Advancement: Guarded Barriers to Reach Goals: Cognitive deficits;Severity of deficits      Swallow Study   General HPI: Anna [name] is a 84 y.o. female  with medical history significant for Alzhemiers dementia, systolic and diastolic CHF, DM2, atrial fibrillation admitted with reports of diaphoresis with clamminess and lethargy. Per chart daughter reports choking and coughing for the past few weeks most times when patient tries to drink water, reports no choking episodes with solid food. Portable chest x-ray shows mild patchy left lower lobe opacity-mild atelectasis or infiltrate. BSE 09/15/16 thin and regular recommended.  Type of Study: Bedside Swallow Evaluation Previous Swallow Assessment: see HPI Diet Prior to this Study: NPO Temperature Spikes Noted: Yes Respiratory Status: Non-rebreather History of Recent Intubation: No Behavior/Cognition: Lethargic/Drowsy;Doesn't follow directions Oral Cavity Assessment: Dry Oral Care Completed by SLP: Yes Oral Cavity - Dentition: Adequate natural dentition Vision: (keeps eyes closed) Self-Feeding Abilities: Total assist Patient Positioning: Upright in bed(could not hold her head up) Baseline Vocal Quality: (no vocalizations) Volitional Cough: Other (Comment)(too weak) Volitional Swallow: Unable to elicit    Oral/Motor/Sensory Function Overall Oral Motor/Sensory Function: Generalized oral weakness(unable to follow commands)   Ice Chips Ice chips: Impaired Presentation: Spoon Oral Phase Impairments: Reduced labial seal;Reduced lingual movement/coordination;Poor awareness of bolus Oral Phase Functional Implications: Oral holding;Right anterior spillage Pharyngeal Phase Impairments: (no swallow initiated)   Thin Liquid Thin Liquid: Not tested    Nectar Thick Nectar Thick Liquid: Not tested   Honey Thick Honey Thick Liquid: Not tested   Puree Puree: Impaired Oral Phase Impairments: Reduced labial seal;Poor awareness of bolus;Reduced lingual movement/coordination Oral Phase Functional Implications: Right anterior spillage;Oral holding Pharyngeal Phase Impairments: (no swallow)   Solid   GO    Solid: Not tested        [name] 02/06/2018,10:05 AM   [name] Caroli[employer] [phone]

## 2018-02-06 NOTE — Progress Notes (Addendum)
[facility] Liaison RN note.  Received request from Jenna,CSW and Larina Bras, RN, PMT  for family interest in [facility] with request for transfer tomorrow. Chart reviewed and approved.  Spoke with daughters Margaretha Sheffield and [name] at bedside to confirm interest and explain services. Family agreeable to transfer tomorrow. Eliezer Lofts, CSW aware.  Registration paper work completed.. Dr. Orpah Melter to assume care per family request. Registration paper work to be completed. Please fax discharge summary to [phone]. RN please call report to [phone]. Please arrange transport for patient to arrive before noon if possible.  Thank you.   Farrel Gordon, RN, [facility] Liaison  [place] are on AMION

## 2018-02-06 NOTE — Progress Notes (Signed)
6/17-Acute/chronic pe, mesenteric artery, thrombosis, bil pna , afib with rvr, unresponsive, on NRB, iv heparin, iv abx, cardizem drip, palliative following - family wants to cont aggressive care.

## 2018-02-06 NOTE — Telephone Encounter (Signed)
Spoke with [name] (dtr), pt is in ICU and will not recover. Clot was found in the superior atery of the intestine and calcified before they were able to get to it. There was an option to do open surgery but the family declined d/t the possibility that she would not make it through the surgery. Daughter reports Pt also having PNA which has recently cleared. White cell count is at 13. Completed last round of abx today. They are not going to place a peg tube with fear of dementia worsening. Pt's alertness comes and goes. Swallow test is going to be repeated as tolerated. They have prepared the family for the patient's passing. Daughter wanted to ensure that [place] got this message and was made aware of current state and she send her appreciation and thanks for the exceptional care she has received from her PCP team and nursing staff.   Will send message to [facility] and [place].

## 2018-02-06 NOTE — Progress Notes (Signed)
[facility] for Heparin Indication:Pulmonary embolus & Mesenteric Artery thrombosis, and afib  Allergies  Allergen Reactions  . Pollen Extract     Sneezing, watery eyes   Patient Measurements: Height: 5\' 5"  (165.1 cm) Weight: 141 lb 1.5 oz (64 kg) IBW/kg (Calculated) : 57 Heparin Dosing Weight: 63.6 kg  Vital Signs: Temp: 98.4 F (36.9 C) (06/18 0700) Temp Source: Oral (06/18 0700) BP: 105/73 (06/18 0600)  Labs: Recent Labs    02/04/18 0224 02/04/18 1059 02/05/18 0331 02/05/18 0823 02/06/18 0224  HGB 11.6*  --  12.7  --  12.7  HCT 37.3  --  41.3  --  41.4  PLT 158  --  185  --  198  HEPARINUNFRC 0.23* 0.32 0.28*  --  0.52  CREATININE  --   --   --  1.15* 1.21*   Estimated Creatinine Clearance: 29.5 mL/min (A) (by C-G formula based on SCr of 1.21 mg/dL (H)).  Assessment:  84 yo Female with PE and mesenteric artery thrombosis for heparin. S/p OR w/ TPA but unsuccessful attempt at mechanical thrombectomy.  No po anticoagulation due to ischemic bowel -Heparin subtherapeutic today 0.53unit/ml, Hgb stable.  She is also on meropenem day 6 (day 7 total antibiotics) for suspected aspiration PNA. Noted plans to stop antibiotics on 6/18.  -WBC = 13.4, afeb -SCr= 1.21, CrCl ~ 30  Goal of Therapy:  Heparin level 0.3-0.7 units/ml Monitor platelets by anticoagulation protocol: Yes   Plan:  -No heparin changes needed -Daily heparin level and CBC -No meropenem dose changes needed  Thanks for allowing pharmacy to be a part of this patient's care.  Hildred Laser, PharmD Clinical Pharmacist Clinical phone from 8:30-4:00 is [phone] After 4pm, please call Main Rx [phone]) for assistance. 02/06/2018 8:48 AM

## 2018-02-06 NOTE — Progress Notes (Addendum)
Palliative Medicine RN Note: We rec'd a call from both of the patient's daughters, Anna [name] and Anna [name], on conference call. They understand that curative options have been exhausted and are ready to transfer to [facility].   We discussed goals of hospice, including dignity and comfort, and we specifically discussed the use of comfort medications. I explained that Cardizem and heparin will not be continued in that setting, and they verbalized understanding.   They are understandably concerned that she is having pain d/t her sacral wound, and they want to be sure she does not suffer more from this. I explained that wound care regimen will likely be changed to a palliative regimen, and they verbalized understanding.   We also discussed locations of inpatient hospice facilities, and they requested [facility] by name, as going out of town to a facility would not be easy for them.   I will initiate orders for inpt hospice and call the referral in to [facility] representative. I will also get orders for comfort medications from a PMT provider. NP Wadie Lessen will return tomorrow.  Please call our office 7a-7p with concerns. As we do not have 24 hour coverage, staff will need to contact the attending physician for concerns outside of these hours.  Marjie Skiff Cherylann Ratel, RN, BSN, [facility] Palliative Medicine Team 02/06/2018 3:21 PM Office [phone]  ADDENDUM: May contact either daughter Anna [name], who is listed in demographics, or daughter Anna [name] [phone])

## 2018-02-07 DIAGNOSIS — I10 Essential (primary) hypertension: Secondary | ICD-10-CM

## 2018-02-07 DIAGNOSIS — F0281 Dementia in other diseases classified elsewhere with behavioral disturbance: Secondary | ICD-10-CM

## 2018-02-07 DIAGNOSIS — G301 Alzheimer's disease with late onset: Secondary | ICD-10-CM

## 2018-02-07 LAB — GLUCOSE, CAPILLARY
GLUCOSE-CAPILLARY: 177 mg/dL — AB (ref 65–99)
Glucose-Capillary: 161 mg/dL — ABNORMAL HIGH (ref 65–99)

## 2018-02-07 MED ORDER — LORAZEPAM 2 MG/ML IJ SOLN: 0.5000 mg | INTRAMUSCULAR | 0 refills | Status: AC | PRN

## 2018-02-07 MED ORDER — FUROSEMIDE 20 MG PO TABS: 40.0000 mg | ORAL_TABLET | ORAL | 0 refills | Status: AC | PRN

## 2018-02-07 NOTE — Care Management Note (Signed)
Case Management Note  Patient Details  Name: Anna [name] MRN: [id number] Date of Birth: February 23, 1929  Subjective/Objective:  Dc to [facility].                  Action/Plan: DC to [facility].  Expected Discharge Date:  02/07/18               Expected Discharge Plan:  [place]  In-House Referral:  Clinical Social Work  Discharge planning Services  CM Consult  Post Acute Care Choice:    Choice offered to:     DME Arranged:    DME Agency:     HH Arranged:    [place] Agency:     Status of Service:  Completed, signed off  If discussed at [employer] [facility] [employer], dates discussed:    Additional Comments:  Zenon Mayo, RN 02/07/2018, 4:08 PM

## 2018-02-07 NOTE — Discharge Summary (Addendum)
Physician Discharge Summary  Anna [name] [facility]:[id number] DOB: 09/20/1928 DOA: 01/30/2018  PCP: Dorothyann Peng, NP  Admit date: 01/30/2018 Discharge date: 02/07/2018  Admitted From: Home Disposition:  [facility])  Recommendations for Outpatient Follow-up:  1. Follow up with Palliative Care services  Discharge Condition:Declining CODE STATUS:DNR/Comfort Diet recommendation: Comfort feeding   Brief/Interim Summary: Anna [name] is a 84 year old chronically ill female with history of advanced dementia, chronic systolic CHF with EF of 66%, chronic atrial fibrillation not on anticoagulation, chronic sacral wound, diabetes mellitus was admitted on 6/11 with lethargy. -She was found to have acute and chronic pulmonary emboli, Superior mesenteric artery thrombosis, bilateral lower lobe pneumonia, atrial fibrillation with RVR. -Continues to remain poorly responsive, seen by vascular surgery on admission., was taken to the OR and had a unsuccessful attempt at mechanical thrombectomy, the thrombus was felt to be well organized suggesting a more chronic appearance., then transferred to ICU at [facility] under [facility] service from [facility] poorly responsive, on IV heparin, Abx for aspiration pneumonia -ECHO with worsening EF to 20% now from 35-40% previously -Palliative medicine following, no signs of meaningful recovery    Superior mesenteric artery thrombosis, - nearly occlusive thrombus, noted to have very tender abd exam, consistent with ongoing bowel ischemia/infarction -Unsuccessful attempt at mechanical thrombectomy 6/12 -She is not a surgical candidate, nothing further to add from Vascular standpoint -Prognosis very poor, due to ongoing bowel infarction, encephalopahy, aspiration pneumonia, metabolic encephalopathy, advanced dementia -palliative medicine following, transition to full comfort. Plan to transfer to [facility]   AKI -Had been worsening -Comfort care  now  Acute and chronic pulmonary emboli -CT angiogram 6/12 noted acute/subacute pulmonary embolism in the segmental and subsegmental left upper lobe pulmonary artery branches and evidence of chronic PE in right upper lobe -Heparin gtt was initially continued, now stopped for comfort care  Aspiration pneumonia -completed 7days of IV Meropenem, blood cultures negative -History well suggestive of long-standing dysphagia prior to admission -educated both daughters that no role for indefinite antibiotics in the setting of aspiration pneumonia, completed 7 days of meropenem  Metabolic encephalopathy -Multifactorial, due to aspiration pneumonia, Bowel ischemia, dehydration -nothing by mouth, blood cultures negative, urine culture with 40,000 Escherichia coli and 80,000 lactobacillus -had to stop IV fluids when she became volume overloaded, especially in light of her worsening CHF and drop in EF to 20% now -Transition to full comfort  Atrial fibrillation with RVR -Initially on IV Cardizem -Had been on heparin gtt, now stopped given comfort care status  Advanced dementia -Continue full comfort status  Chronic systolic CHF EF now 29% -EF dropped from 35-40% to 20% -IVF stopped, mild VOl overload -Cards signed off, nothing further to add, Poor prognosis  Diabetes mellitus -Comfort care status only   Discharge Diagnoses:  Active Problems:   Benign essential HTN   DM (diabetes mellitus) type II controlled with renal manifestation (HCC)   Lower urinary tract infectious disease   Atrial fibrillation with RVR (HCC)   Acute on chronic diastolic heart failure (HCC)   Acute metabolic encephalopathy   Alzheimer's dementia with behavioral disturbance   Pneumonia   Pressure injury of skin   Mesenteric artery thrombosis (HCC)   Goals of care, counseling/discussion   Palliative care by specialist   Unresponsiveness   Hypoxia    Discharge Instructions   Allergies as of 02/07/2018       Reactions   Pollen Extract    Sneezing, watery eyes      Medication  List    STOP taking these medications   ACCU-CHEK AVIVA PLUS test strip Generic drug:  glucose blood   ALAVERT 10 MG tablet Generic drug:  loratadine   aspirin 325 MG tablet   AZO TABS PO   calcitRIOL 0.5 MCG capsule Commonly known as:  ROCALTROL   carbamide peroxide 6.5 % OTIC solution Commonly known as:  DEBROX   cholecalciferol 1000 units tablet Commonly known as:  VITAMIN D   donepezil 10 MG tablet Commonly known as:  ARICEPT   fluconazole 100 MG tablet Commonly known as:  DIFLUCAN   FUSION PLUS Caps   glimepiride 2 MG tablet Commonly known as:  AMARYL   memantine 5 MG tablet Commonly known as:  NAMENDA   metoprolol tartrate 25 MG tablet Commonly known as:  LOPRESSOR   MUCINEX PO   omeprazole 40 MG capsule Commonly known as:  PRILOSEC   polyethylene glycol packet Commonly known as:  MIRALAX / GLYCOLAX   potassium chloride SA 20 MEQ tablet Commonly known as:  K-DUR,KLOR-CON   saccharomyces boulardii 250 MG capsule Commonly known as:  FLORASTOR   vitamin B-12 500 MCG tablet Commonly known as:  CYANOCOBALAMIN     TAKE these medications   furosemide 20 MG tablet Commonly known as:  LASIX Take 2 tablets (40 mg total) by mouth as needed for fluid (shortness of breath, comfort). What changed:  See the new instructions.   HYDROcodone-acetaminophen 5-325 MG tablet Commonly known as:  NORCO Take 0.5 tablets by mouth every 8 (eight) hours as needed for moderate pain.   LORazepam 2 MG/ML injection Commonly known as:  ATIVAN Inject 0.25 mLs (0.5 mg total) into the vein every 4 (four) hours as needed for anxiety.   SYSTANE 0.4-0.3 % Soln Generic drug:  Polyethyl Glycol-Propyl Glycol Apply 1 drop to eye 2 (two) times daily as needed (dry eyes).       Allergies  Allergen Reactions  . Pollen Extract     Sneezing, watery eyes     Consultations:  VVS  Cards  PCCM  Palliative medicine  Procedures/Studies: Ct Head Wo Contrast  Result Date: 01/30/2018 CLINICAL DATA:  Altered mental status, altered level of consciousness today, less responsive, history Alzheimer's, atrial fibrillation, bronchitis, CHF, stage III chronic renal disease, hypertension EXAM: CT HEAD WITHOUT CONTRAST TECHNIQUE: Contiguous axial images were obtained from the base of the skull through the vertex without intravenous contrast. Sagittal and coronal MPR images reconstructed from axial data set. COMPARISON:  12/30/2016 FINDINGS: Brain: Generalized atrophy. Normal ventricular morphology. No midline shift or mass effect. Minimal vessel chronic ischemic changes of deep cerebral white matter. No intracranial hemorrhage, mass lesion, evidence of acute infarction, or extra-axial fluid collection. Vascular: No hyperdense vessels. Minimal atherosclerotic calcification of internal carotid arteries at skull base. Skull: Intact Sinuses/Orbits: Clear Other: N/A IMPRESSION: Atrophy with minimal small vessel chronic ischemic changes of deep cerebral white matter. No acute intracranial abnormalities. Electronically Signed   By: Lavonia Dana M.D.   On: 01/30/2018 19:08   Ct Angio Chest Pe W Or Wo Contrast  Result Date: 01/31/2018 CLINICAL DATA:  Inpatient. Severe sepsis. Fever. Abdominal pain. Leukocytosis. Clinical concern for intra-abdominal abscess. EXAM: CT ANGIOGRAPHY CHEST CT ABDOMEN AND PELVIS WITH CONTRAST TECHNIQUE: Multidetector CT imaging of the chest was performed using the standard protocol during bolus administration of intravenous contrast. Multiplanar CT image reconstructions and MIPs were obtained to evaluate the vascular anatomy. Multidetector CT imaging of the abdomen and pelvis was performed using the standard protocol  during bolus administration of intravenous contrast. CONTRAST:  53mL ISOVUE-370 IOPAMIDOL (ISOVUE-370) INJECTION 76% COMPARISON:   12/30/2016 chest CT angiogram. 07/10/2017 pelvis CT. 01/30/2018 chest radiograph. FINDINGS: CTA CHEST FINDINGS Cardiovascular: The study is moderate quality for the evaluation of pulmonary embolism, with some motion degradation limiting evaluation of the segmental and subsegmental vessels. No central or lobar pulmonary emboli. There are segmental and subsegmental pulmonary emboli in the left upper lobe (series 7/image 78), which appear acute/subacute. There is a thin web like filling defect within a segmental right upper lobe pulmonary artery branch (series 7/image 92). No additional pulmonary artery branch filling defects. Top-normal caliber main pulmonary artery (3.0 cm diameter). Mild-to-moderate cardiomegaly. No significant pericardial fluid/thickening. Left anterior descending and right coronary atherosclerosis. Mediastinum/Nodes: Stable goiter asymmetric to the left with heterogeneous thyroid parenchyma. Unremarkable esophagus. No axillary adenopathy. Mild right paratracheal adenopathy measuring up to the 1.1 cm (series 4/image 32), stable since 12/30/2016, most compatible with benign reactive adenopathy. No additional pathologically enlarged mediastinal nodes. No hilar adenopathy. Lungs/Pleura: No pneumothorax. Trace dependent bilateral pleural effusions. Nonspecific soft tissue layering in bronchus intermedius and central right lower lobe airways. Subpleural 3 mm solid anterior left upper lobe pulmonary nodule (series 6/image 27), stable since 12/30/2016 CT, considered benign. Mild patchy consolidation, ground-glass opacity and volume loss throughout the bilateral dependent lungs. No new significant pulmonary nodules in the aerated portions of the lungs. Musculoskeletal: No aggressive appearing focal osseous lesions. Moderate thoracic spondylosis. Review of the MIP images confirms the above findings. CT ABDOMEN and PELVIS FINDINGS Motion degraded scan, limiting assessment. Hepatobiliary: Normal liver with no  liver mass. Normal gallbladder with no radiopaque cholelithiasis. No biliary ductal dilatation. Pancreas: Normal, with no mass or duct dilation. Spleen: Normal size. No mass. Adrenals/Urinary Tract: Mild thickening of the adrenal glands bilaterally without discrete adrenal nodules. No hydronephrosis. Subcentimeter hypodense renal cortical lesions in both kidneys are too small to characterize and require no follow-up. Normal bladder. Stomach/Bowel: Normal non-distended stomach. Normal caliber small bowel. Diffuse haziness of the small bowel wall without definite small bowel wall thickening. No pneumatosis. Appendix not discretely visualized. No pericecal inflammatory changes. Prominent rectal stool distending the rectum up to 8.6 cm diameter with the suggestion of mild rectal wall thickening, without significant acute perirectal fat stranding or definite pneumatosis. Otherwise no colonic wall thickening. Vascular/Lymphatic: Atherosclerotic nonaneurysmal abdominal aorta. Patent portal, splenic, hepatic and renal veins. 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 No pathologically enlarged lymph nodes in the abdomen or pelvis. Reproductive: Grossly normal uterus.  No adnexal mass. Other: No pneumoperitoneum, ascites or focal fluid collection. Musculoskeletal: No aggressive appearing focal osseous lesions. Marked lumbar spondylosis. Review of the MIP images confirms the above findings. IMPRESSION: 1. 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 2. Diffuse haziness of the wall of the small bowel without definite small bowel wall thickening or pneumatosis. Early bowel ischemia not excluded. No free air or abscess. 3. Acute/subacute pulmonary embolism in segmental/subsegmental left upper lobe pulmonary artery branches. Evidence of chronic pulmonary embolism in the right upper lobe.  No central pulmonary embolism. Top-normal caliber main pulmonary artery. 4. Mild-to-moderate cardiomegaly. Trace dependent bilateral pleural effusions. 5. Dependent opacity with some volume loss throughout both lungs, favor atelectasis, difficult to exclude a component of aspiration or pneumonia. 6. Prominent rectal stool with mild rectal wall thickening, cannot exclude rectal fecal impaction or early stercoral colitis. 7. Aortic Atherosclerosis (ICD10-I70.0).  Critical Value/emergent results were called by telephone at the time of interpretation on 01/31/2018 at 4:20 pm to Dr. Irene Pap , who verbally acknowledged these results. Electronically Signed   By: Ilona Sorrel M.D.   On: 01/31/2018 16:27   Ct Abdomen Pelvis W Contrast  Result Date: 01/31/2018 CLINICAL DATA:  Inpatient. Severe sepsis. Fever. Abdominal pain. Leukocytosis. Clinical concern for intra-abdominal abscess. EXAM: CT ANGIOGRAPHY CHEST CT ABDOMEN AND PELVIS WITH CONTRAST TECHNIQUE: Multidetector CT imaging of the chest was performed using the standard protocol during bolus administration of intravenous contrast. Multiplanar CT image reconstructions and MIPs were obtained to evaluate the vascular anatomy. Multidetector CT imaging of the abdomen and pelvis was performed using the standard protocol during bolus administration of intravenous contrast. CONTRAST:  44mL ISOVUE-370 IOPAMIDOL (ISOVUE-370) INJECTION 76% COMPARISON:  12/30/2016 chest CT angiogram. 07/10/2017 pelvis CT. 01/30/2018 chest radiograph. FINDINGS: CTA CHEST FINDINGS Cardiovascular: The study is moderate quality for the evaluation of pulmonary embolism, with some motion degradation limiting evaluation of the segmental and subsegmental vessels. No central or lobar pulmonary emboli. There are segmental and subsegmental pulmonary emboli in the left upper lobe (series 7/image 78), which appear acute/subacute. There is a thin web like filling defect within a segmental right upper lobe  pulmonary artery branch (series 7/image 92). No additional pulmonary artery branch filling defects. Top-normal caliber main pulmonary artery (3.0 cm diameter). Mild-to-moderate cardiomegaly. No significant pericardial fluid/thickening. Left anterior descending and right coronary atherosclerosis. Mediastinum/Nodes: Stable goiter asymmetric to the left with heterogeneous thyroid parenchyma. Unremarkable esophagus. No axillary adenopathy. Mild right paratracheal adenopathy measuring up to the 1.1 cm (series 4/image 32), stable since 12/30/2016, most compatible with benign reactive adenopathy. No additional pathologically enlarged mediastinal nodes. No hilar adenopathy. Lungs/Pleura: No pneumothorax. Trace dependent bilateral pleural effusions. Nonspecific soft tissue layering in bronchus intermedius and central right lower lobe airways. Subpleural 3 mm solid anterior left upper lobe pulmonary nodule (series 6/image 27), stable since 12/30/2016 CT, considered benign. Mild patchy consolidation, ground-glass opacity and volume loss throughout the bilateral dependent lungs. No new significant pulmonary nodules in the aerated portions of the lungs. Musculoskeletal: No aggressive appearing focal osseous lesions. Moderate thoracic spondylosis. Review of the MIP images confirms the above findings. CT ABDOMEN and PELVIS FINDINGS Motion degraded scan, limiting assessment. Hepatobiliary: Normal liver with no liver mass. Normal gallbladder with no radiopaque cholelithiasis. No biliary ductal dilatation. Pancreas: Normal, with no mass or duct dilation. Spleen: Normal size. No mass. Adrenals/Urinary Tract: Mild thickening of the adrenal glands bilaterally without discrete adrenal nodules. No hydronephrosis. Subcentimeter hypodense renal cortical lesions in both kidneys are too small to characterize and require no follow-up. Normal bladder. Stomach/Bowel: Normal non-distended stomach. Normal caliber small bowel. Diffuse haziness of  the small bowel wall without definite small bowel wall thickening. No pneumatosis. Appendix not discretely visualized. No pericecal inflammatory changes. Prominent rectal stool distending the rectum up to 8.6 cm diameter with the suggestion of mild rectal wall thickening, without significant acute perirectal fat stranding or definite pneumatosis. Otherwise no colonic wall thickening. Vascular/Lymphatic: Atherosclerotic nonaneurysmal abdominal aorta. Patent portal, splenic, hepatic and renal veins. 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 No pathologically enlarged lymph nodes in the abdomen or pelvis. Reproductive: Grossly normal uterus.  No adnexal mass. Other: No pneumoperitoneum, ascites or focal fluid collection. Musculoskeletal: No aggressive appearing focal osseous lesions. Marked lumbar spondylosis. Review of the MIP images confirms the above findings. IMPRESSION: 1. Acute thrombosis of the superior  mesenteric artery, which appears near occlusive on this limited motion degraded scan. 2. Diffuse haziness of the wall of the small bowel without definite small bowel wall thickening or pneumatosis. Early bowel ischemia not excluded. No free air or abscess. 3. Acute/subacute pulmonary embolism in segmental/subsegmental left upper lobe pulmonary artery branches. Evidence of chronic pulmonary embolism in the right upper lobe. No central pulmonary embolism. Top-normal caliber main pulmonary artery. 4. Mild-to-moderate cardiomegaly. Trace dependent bilateral pleural effusions. 5. Dependent opacity with some volume loss throughout both lungs, favor atelectasis, difficult to exclude a component of aspiration or pneumonia. 6. Prominent rectal stool with mild rectal wall thickening, cannot exclude rectal fecal impaction or early stercoral colitis. 7. Aortic Atherosclerosis (ICD10-I70.0). Critical Value/emergent results were called by telephone at the  time of interpretation on 01/31/2018 at 4:20 pm to Dr. Irene Pap , who verbally acknowledged these results. Electronically Signed   By: Ilona Sorrel M.D.   On: 01/31/2018 16:27   Dg Chest Port 1 View  Result Date: 02/02/2018 CLINICAL DATA:  Hypoxia EXAM: PORTABLE CHEST 1 VIEW COMPARISON:  Chest radiograph January 30, 2018 and chest CT January 31, 2018 FINDINGS: There is no appreciable edema or consolidation. There is cardiomegaly with pulmonary vascularity within normal limits. No adenopathy. Bones are osteoporotic. IMPRESSION: Stable cardiac prominence.  No edema or consolidation. Electronically Signed   By: Lowella Grip III M.D.   On: 02/02/2018 09:18   Dg Chest Port 1 View  Result Date: 01/30/2018 CLINICAL DATA:  Diaphoresis, lethargy, history Alzheimer's, atrial fibrillation, CHF, stage III chronic renal insufficiency, diabetes mellitus, hypertension, recent surgery for coccygeal abscess EXAM: PORTABLE CHEST 1 VIEW COMPARISON:  Portable exam 1844 hours compared to 07/27/2017 FINDINGS: Enlargement of cardiac silhouette with pulmonary vascular congestion. Atherosclerotic calcification aorta. Patchy opacity at LEFT base could represent infiltrate or atelectasis in LEFT lower lobe. Remaining lungs grossly clear. Skin fold projects over LEFT upper chest. No pneumothorax or gross pleural effusion. Bones demineralized. IMPRESSION: Mild patchy LEFT basilar opacity question mild atelectasis or infiltrate in LEFT lower lobe. Electronically Signed   By: Lavonia Dana M.D.   On: 01/30/2018 18:59    Subjective: Poorly responsive  Discharge Exam: Vitals:   02/07/18 0700 02/07/18 0745  BP: (!) 106/51   Pulse:    Resp: (!) 25   Temp:  (!) 100.6 F (38.1 C)  SpO2: 98%    Vitals:   02/07/18 0500 02/07/18 0600 02/07/18 0700 02/07/18 0745  BP: (!) 108/53 (!) 122/51 (!) 106/51   Pulse:      Resp: (!) 31 (!) 31 (!) 25   Temp:    (!) 100.6 F (38.1 C)  TempSrc:    Axillary  SpO2: 97% 97% 98%   Weight:       Height:        General: Pt is alert, awake, not in acute distress Cardiovascular: RRR, S1/S2 +, no rubs, no gallops Respiratory: CTA bilaterally, no wheezing, no rhonchi Abdominal: Soft, NT, ND, bowel sounds + Extremities: no edema, no cyanosis   The results of significant diagnostics from this hospitalization (including imaging, microbiology, ancillary and laboratory) are listed below for reference.     Microbiology: Recent Results (from the past 240 hour(s))  Urine culture     Status: Abnormal   Collection Time: 01/30/18  8:32 PM  Result Value Ref Range Status   Specimen Description   Final    URINE, RANDOM Performed at [facility] [address] [place], [place] [address]  Special Requests   Final    NONE Performed at [facility], [place] [address], [place], [place] [address]    Culture (A)  Final    40,000 COLONIES/mL KLEBSIELLA PNEUMONIAE 80,000 COLONIES/mL LACTOBACILLUS SPECIES Standardized susceptibility testing for this organism is not available. Performed at [facility], [place] [address], [place], [place] [address]    Report Status 02/02/2018 FINAL  Final   Organism ID, Bacteria KLEBSIELLA PNEUMONIAE (A)  Final      Susceptibility   Klebsiella pneumoniae - MIC*    AMPICILLIN RESISTANT Resistant     CEFAZOLIN <=4 SENSITIVE Sensitive     CEFTRIAXONE <=1 SENSITIVE Sensitive     CIPROFLOXACIN <=0.25 SENSITIVE Sensitive     GENTAMICIN <=1 SENSITIVE Sensitive     IMIPENEM <=0.25 SENSITIVE Sensitive     NITROFURANTOIN 32 SENSITIVE Sensitive     TRIMETH/SULFA <=20 SENSITIVE Sensitive     AMPICILLIN/SULBACTAM 4 SENSITIVE Sensitive     PIP/TAZO <=4 SENSITIVE Sensitive     Extended ESBL NEGATIVE Sensitive     * 40,000 COLONIES/mL KLEBSIELLA PNEUMONIAE  MRSA PCR Screening     Status: None   Collection Time: 01/31/18  6:48 AM  Result Value Ref Range Status   MRSA by PCR NEGATIVE NEGATIVE Final    Comment:        The  GeneXpert MRSA Assay (FDA approved for NASAL specimens only), is one component of a comprehensive MRSA colonization surveillance program. It is not intended to diagnose MRSA infection nor to guide or monitor treatment for MRSA infections. Performed at [facility], [place] [address] [place], [place] [address]   Culture, blood (routine x 2)     Status: None   Collection Time: 01/31/18  6:56 AM  Result Value Ref Range Status   Specimen Description   Final    BLOOD RIGHT HAND Performed at [facility] [address], [place], [place] [address]    Special Requests   Final    BOTTLES DRAWN AEROBIC AND ANAEROBIC Blood Culture adequate volume Performed at [facility] [address], [place], [place] [address]    Culture   Final    NO GROWTH 5 DAYS Performed at [facility], [place] [address], [place], [place] [address]    Report Status 02/05/2018 FINAL  Final  Culture, blood (routine x 2)     Status: None   Collection Time: 01/31/18  6:56 AM  Result Value Ref Range Status   Specimen Description   Final    BLOOD LEFT HAND Performed at [facility] [address] [place], [place] [address]    Special Requests   Final    BOTTLES DRAWN AEROBIC AND ANAEROBIC Blood Culture adequate volume Performed at [facility] [address], [place], [place] [address]    Culture   Final    NO GROWTH 5 DAYS Performed at [facility], [place] [address] [place], [place] [address]    Report Status 02/05/2018 FINAL  Final     Labs: BNP (last 3 results) Recent Labs    07/10/17 1127 07/27/17 0957  BNP 306.2* 295.1*   Basic Metabolic Panel: Recent Labs  Lab 02/01/18 1228 02/02/18 0703 02/03/18 0239 02/05/18 0823 02/06/18 0224  NA 147* 142 144 144 148*  K 3.7 4.4 3.5 4.1 4.1  CL 118* 117* 114* 118* 119*  CO2 17* 16* 19* 18* 19*  GLUCOSE 187* 91 165* 148*  110*  BUN 33* 43* 48* 47*  55*  CREATININE 1.23* 1.32* 1.55* 1.15* 1.21*  CALCIUM 9.1 8.8* 8.9 9.2 9.2   Liver Function Tests: No results for input(s): AST, ALT, ALKPHOS, BILITOT, PROT, ALBUMIN in the last 168 hours. No results for input(s): LIPASE, AMYLASE in the last 168 hours. No results for input(s): AMMONIA in the last 168 hours. CBC: Recent Labs  Lab 02/02/18 0548 02/03/18 0239 02/04/18 0224 02/05/18 0331 02/06/18 0224  WBC 18.8* 20.1* 13.8* 16.8* 13.4*  HGB 14.0 11.5* 11.6* 12.7 12.7  HCT 43.6 37.3 37.3 41.3 41.4  MCV 91.8 94.4 94.2 93.7 95.0  PLT PLATELET CLUMPS NOTED ON SMEAR, UNABLE TO ESTIMATE 166 158 185 198   Cardiac Enzymes: Recent Labs  Lab 01/31/18 1418 02/01/18 0127 02/01/18 0636 02/01/18 1228  TROPONINI 0.21* 0.20* 0.25* 0.13*   BNP: Invalid input(s): POCBNP CBG: Recent Labs  Lab 02/06/18 1616 02/06/18 1937 02/06/18 2346 02/07/18 0355 02/07/18 0748  GLUCAP 193* 185* 186* 161* 177*   D-Dimer No results for input(s): DDIMER in the last 72 hours. Hgb A1c No results for input(s): HGBA1C in the last 72 hours. Lipid Profile No results for input(s): CHOL, HDL, LDLCALC, TRIG, CHOLHDL, LDLDIRECT in the last 72 hours. Thyroid function studies No results for input(s): TSH, T4TOTAL, T3FREE, THYROIDAB in the last 72 hours.  Invalid input(s): FREET3 Anemia work up No results for input(s): VITAMINB12, FOLATE, FERRITIN, TIBC, IRON, RETICCTPCT in the last 72 hours. Urinalysis    Component Value Date/Time   COLORURINE YELLOW 01/30/2018 2009   APPEARANCEUR CLEAR 01/30/2018 2009   LABSPEC 1.016 01/30/2018 2009   PHURINE 5.0 01/30/2018 2009   GLUCOSEU NEGATIVE 01/30/2018 2009   HGBUR NEGATIVE 01/30/2018 2009   BILIRUBINUR NEGATIVE 01/30/2018 2009   BILIRUBINUR Negative 02/11/2017 1234   KETONESUR 5 (A) 01/30/2018 2009   PROTEINUR NEGATIVE 01/30/2018 2009   UROBILINOGEN 0.2 02/11/2017 1234   UROBILINOGEN 0.2 01/27/2015 1353   NITRITE NEGATIVE 01/30/2018 2009   LEUKOCYTESUR  LARGE (A) 01/30/2018 2009   Sepsis Labs Invalid input(s): PROCALCITONIN,  WBC,  LACTICIDVEN Microbiology Recent Results (from the past 240 hour(s))  Urine culture     Status: Abnormal   Collection Time: 01/30/18  8:32 PM  Result Value Ref Range Status   Specimen Description   Final    URINE, RANDOM Performed at [facility], [place] [address] [place], [place] [address]    Special Requests   Final    NONE Performed at [facility], [place] [address] [place], [place] [address]    Culture (A)  Final    40,000 COLONIES/mL KLEBSIELLA PNEUMONIAE 80,000 COLONIES/mL LACTOBACILLUS SPECIES Standardized susceptibility testing for this organism is not available. Performed at [facility], [place] [address] [place], [place] [address]    Report Status 02/02/2018 FINAL  Final   Organism ID, Bacteria KLEBSIELLA PNEUMONIAE (A)  Final      Susceptibility   Klebsiella pneumoniae - MIC*    AMPICILLIN RESISTANT Resistant     CEFAZOLIN <=4 SENSITIVE Sensitive     CEFTRIAXONE <=1 SENSITIVE Sensitive     CIPROFLOXACIN <=0.25 SENSITIVE Sensitive     GENTAMICIN <=1 SENSITIVE Sensitive     IMIPENEM <=0.25 SENSITIVE Sensitive     NITROFURANTOIN 32 SENSITIVE Sensitive     TRIMETH/SULFA <=20 SENSITIVE Sensitive     AMPICILLIN/SULBACTAM 4 SENSITIVE Sensitive     PIP/TAZO <=4 SENSITIVE Sensitive     Extended ESBL NEGATIVE Sensitive     * 40,000  COLONIES/mL KLEBSIELLA PNEUMONIAE  MRSA PCR Screening     Status: None   Collection Time: 01/31/18  6:48 AM  Result Value Ref Range Status   MRSA by PCR NEGATIVE NEGATIVE Final    Comment:        The GeneXpert MRSA Assay (FDA approved for NASAL specimens only), is one component of a comprehensive MRSA colonization surveillance program. It is not intended to diagnose MRSA infection nor to guide or monitor treatment for MRSA infections. Performed at [facility], [place] [address] [place], [place]  [address]   Culture, blood (routine x 2)     Status: None   Collection Time: 01/31/18  6:56 AM  Result Value Ref Range Status   Specimen Description   Final    BLOOD RIGHT HAND Performed at [place] [address] [place], [place] [address]    Special Requests   Final    BOTTLES DRAWN AEROBIC AND ANAEROBIC Blood Culture adequate volume Performed at [place] [address], [place], [place] [address]    Culture   Final    NO GROWTH 5 DAYS Performed at [facility], [place] [address], [place], [place] [address]    Report Status 02/05/2018 FINAL  Final  Culture, blood (routine x 2)     Status: None   Collection Time: 01/31/18  6:56 AM  Result Value Ref Range Status   Specimen Description   Final    BLOOD LEFT HAND Performed at [place] [address], [place], [place] [address]    Special Requests   Final    BOTTLES DRAWN AEROBIC AND ANAEROBIC Blood Culture adequate volume Performed at [place] [address] [place], [place] [address]    Culture   Final    NO GROWTH 5 DAYS Performed at [facility], [place] [address] [place],  [address]    Report Status 02/05/2018 FINAL  Final   Time spent: 68min  SIGNED:   Marylu Lund, MD  [facility] 02/07/2018, 8:44 AM  If 7PM-7AM, please contact night-coverage [email or web address] Password [email or web address]

## 2018-02-07 NOTE — Progress Notes (Signed)
PTAR here to transfer pt to [facility] place.  Family at bedside.  Pt changed to her own personal gown.  Emotional support given to pt & family.  Pt's VSS at time of transfer.

## 2018-02-07 NOTE — Progress Notes (Signed)
Patient will discharge to [facility] Anticipated discharge date: 6/19 Family notified: at bedside Transportation by [facility]- called at [place] signing off.  Jorge Ny, LCSW Clinical Social Worker [phone]

## 2018-02-09 ENCOUNTER — Ambulatory Visit: Payer: Medicare HMO | Admitting: Podiatry

## 2018-02-19 DEATH — deceased

## 2018-12-05 IMAGING — CR DG CHEST 2V
2 series · 2 of 2 positions shown · non-contrast
Comparison: None.

CLINICAL DATA: Initial evaluation for with a acute cough, AFib with
hypertension.

EXAM:
CHEST  2 VIEW

[w chest lat]
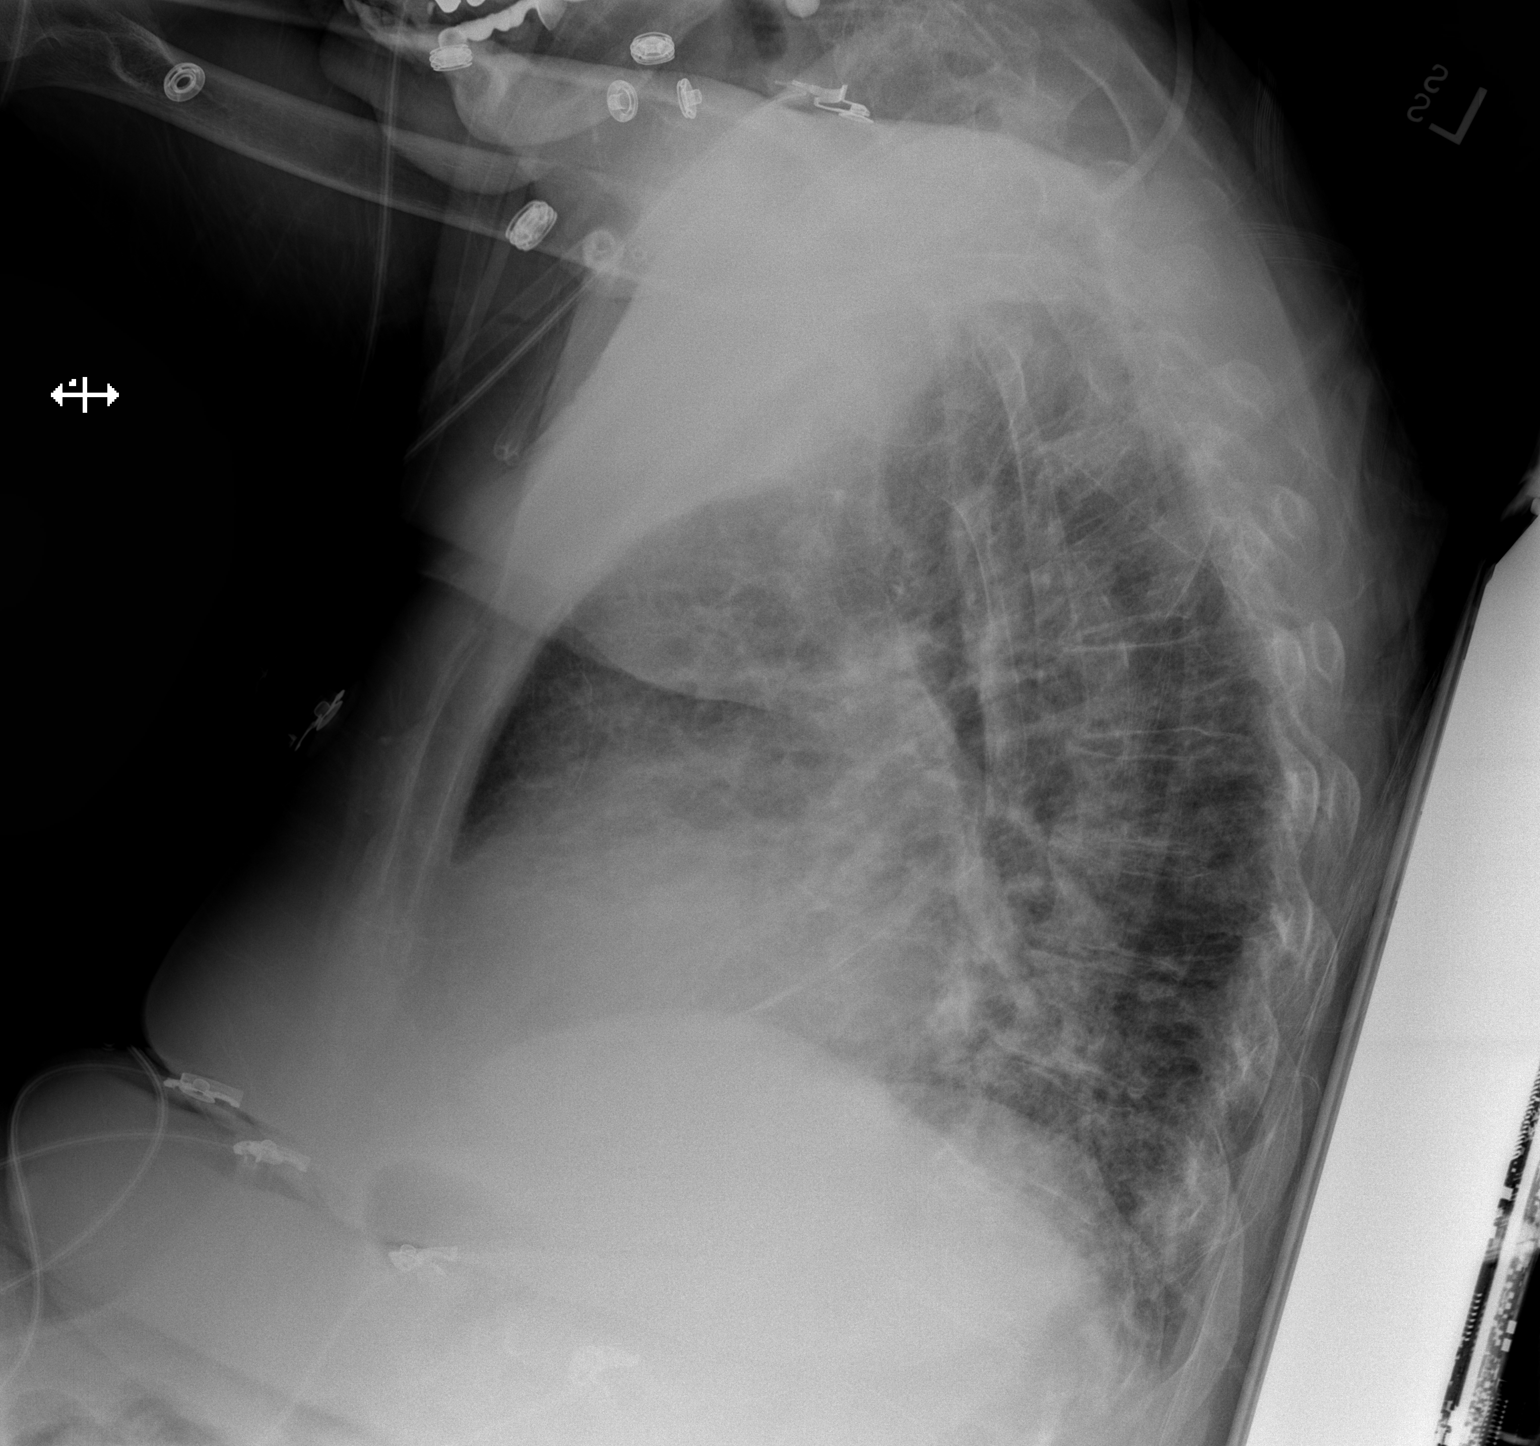

[x chest ap]
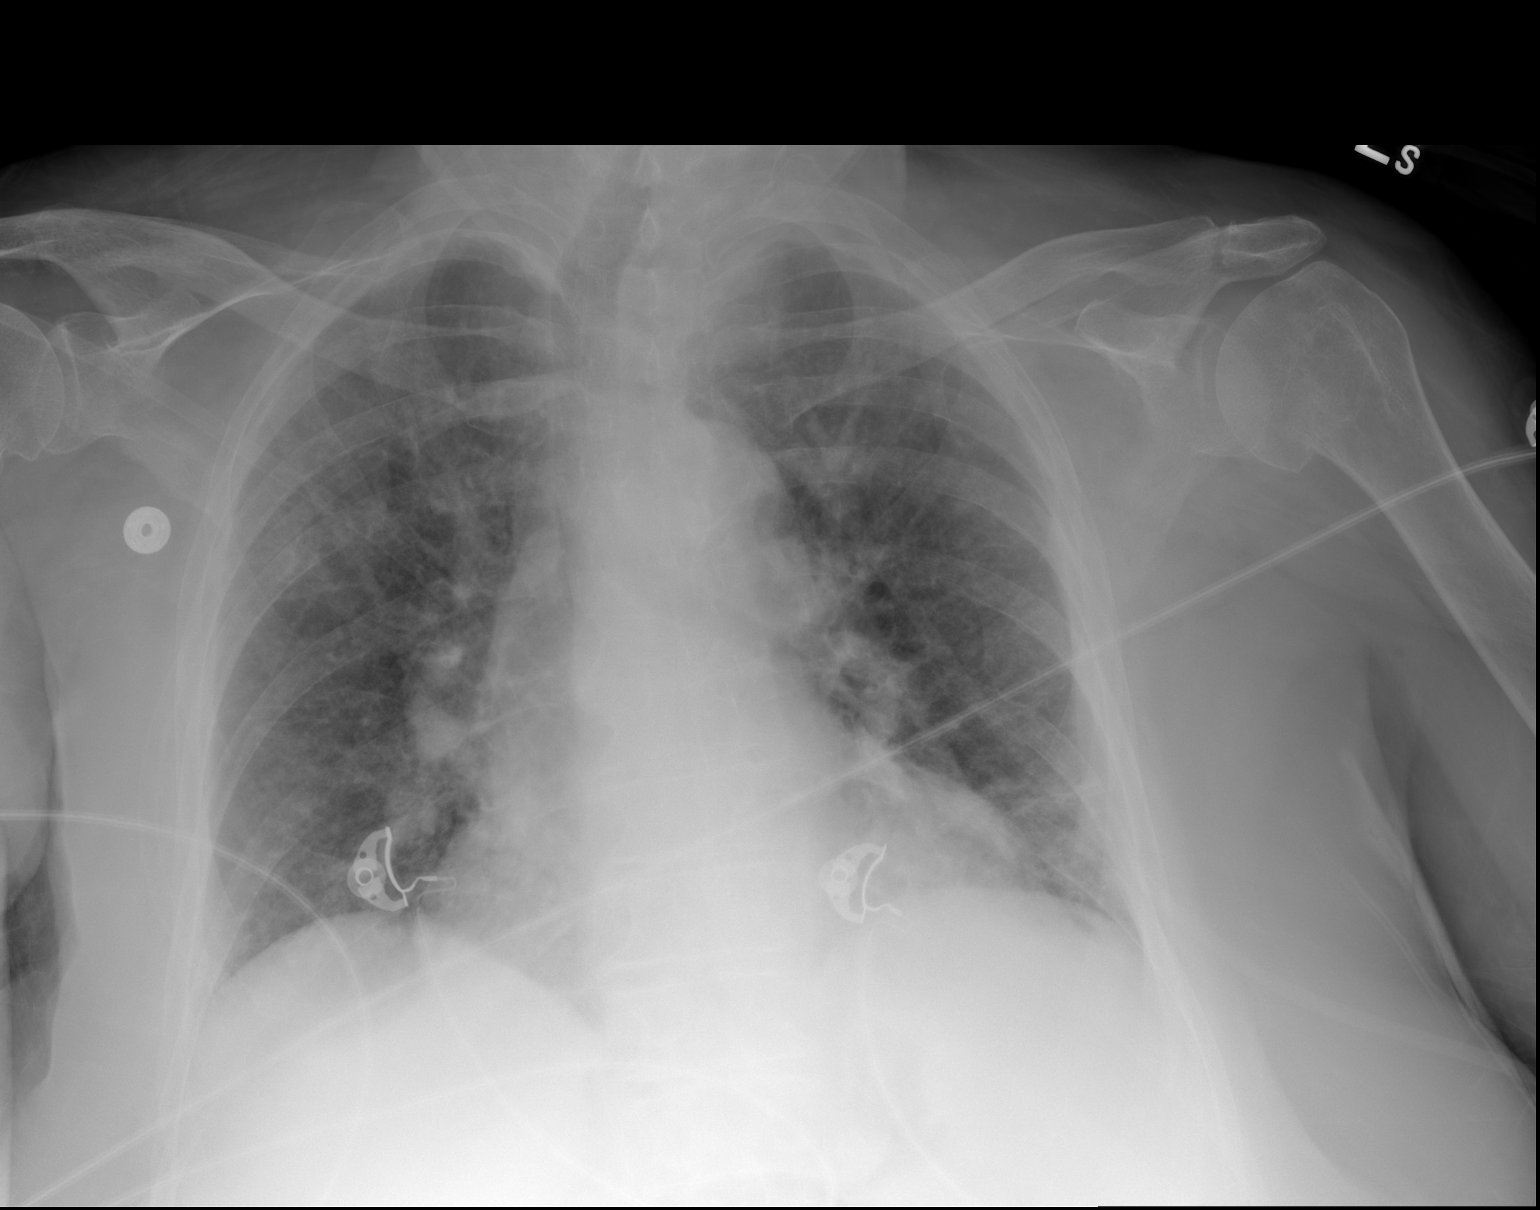

[2 of 2 positions shown; findings below may reference images not displayed]

FINDINGS: Cardiomegaly.  Mediastinal silhouette within normal limits.

Lungs hypoinflated. Diffuse vascular congestion with interstitial
prominence, suspected to reflect pulmonary interstitial edema.
Superimposed streaky left basilar opacities favored to reflect
atelectasis. No other focal infiltrates identified. No pleural
effusion. No pneumothorax.

No acute osseous abnormality.
IMPRESSION: 1. Cardiomegaly with diffuse pulmonary vascular congestion and
interstitial prominence, suggesting mild pulmonary interstitial
edema.
2. Superimposed patchy and linear left basilar opacity, favored to
reflect atelectasis.

## 2019-05-01 IMAGING — DX DG CHEST 2V
2 series · 2 of 2 positions shown · non-contrast
Comparison: Chest CT 12/30/2016 and chest radiograph 12/30/2016

CLINICAL DATA: Cough and congestion

EXAM:
CHEST  2 VIEW

[chest lat]
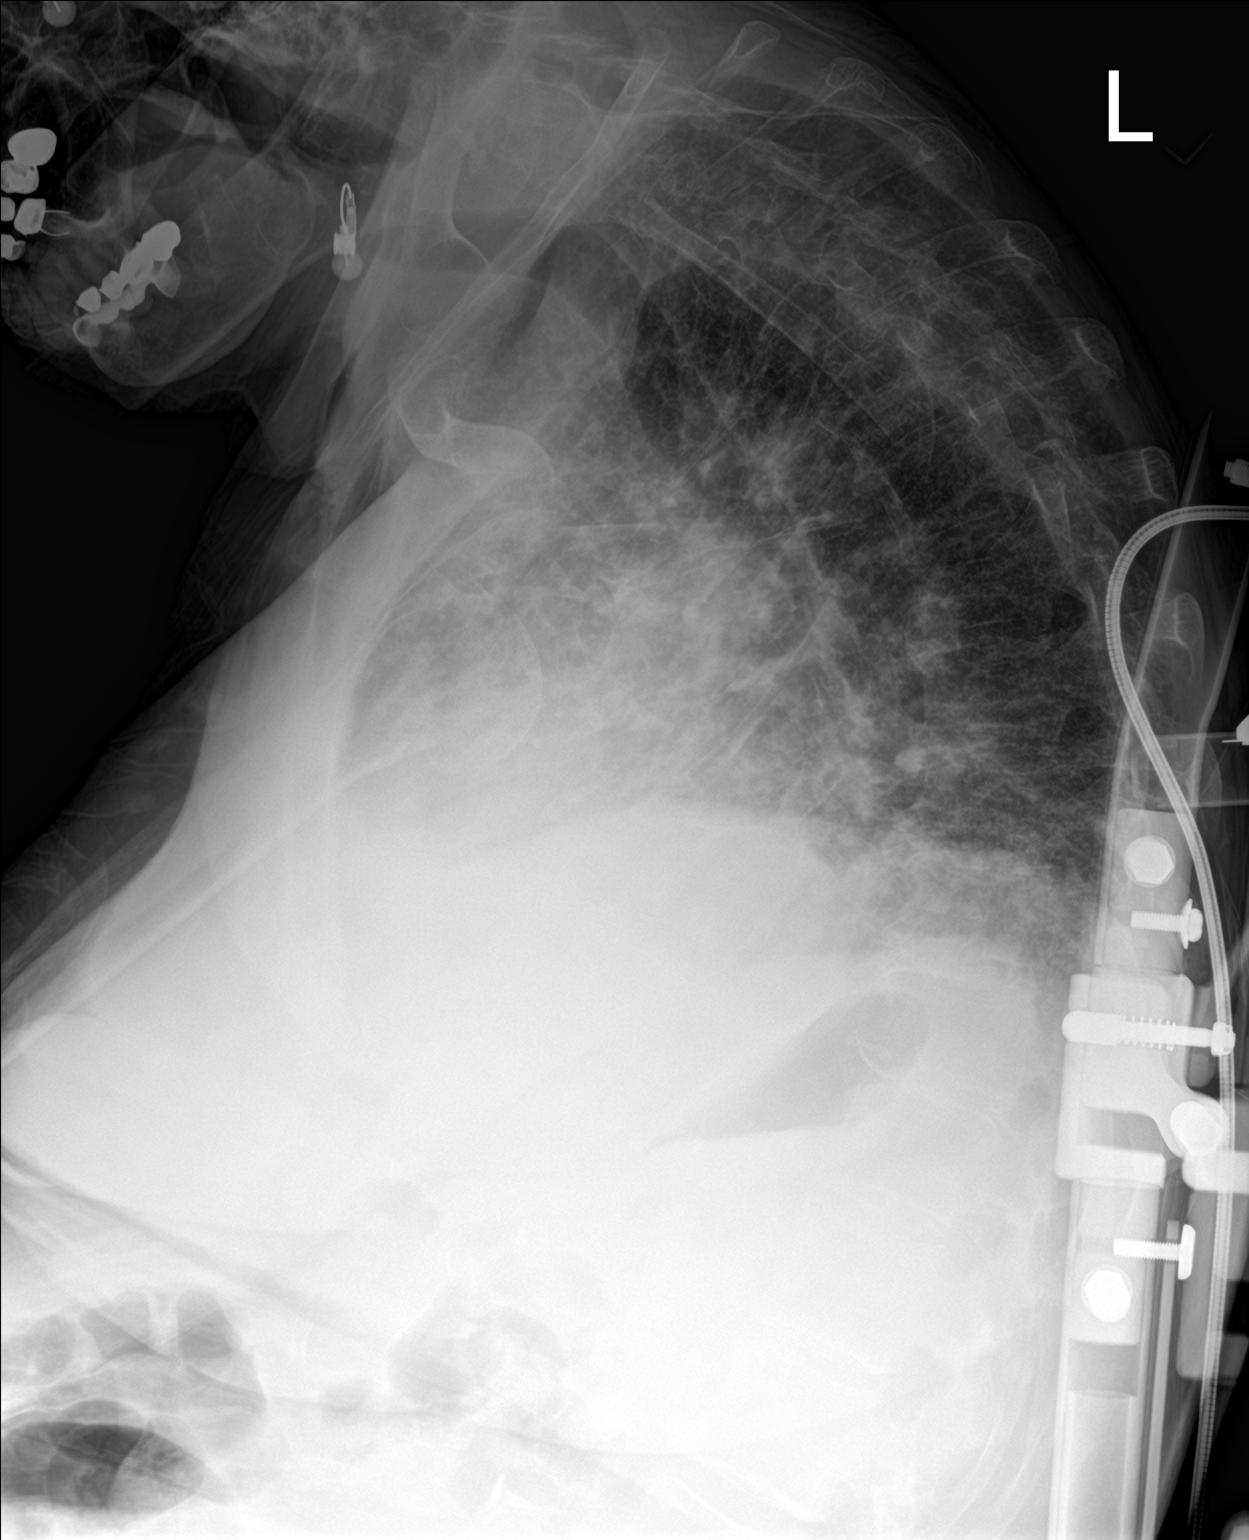

[chest ap]
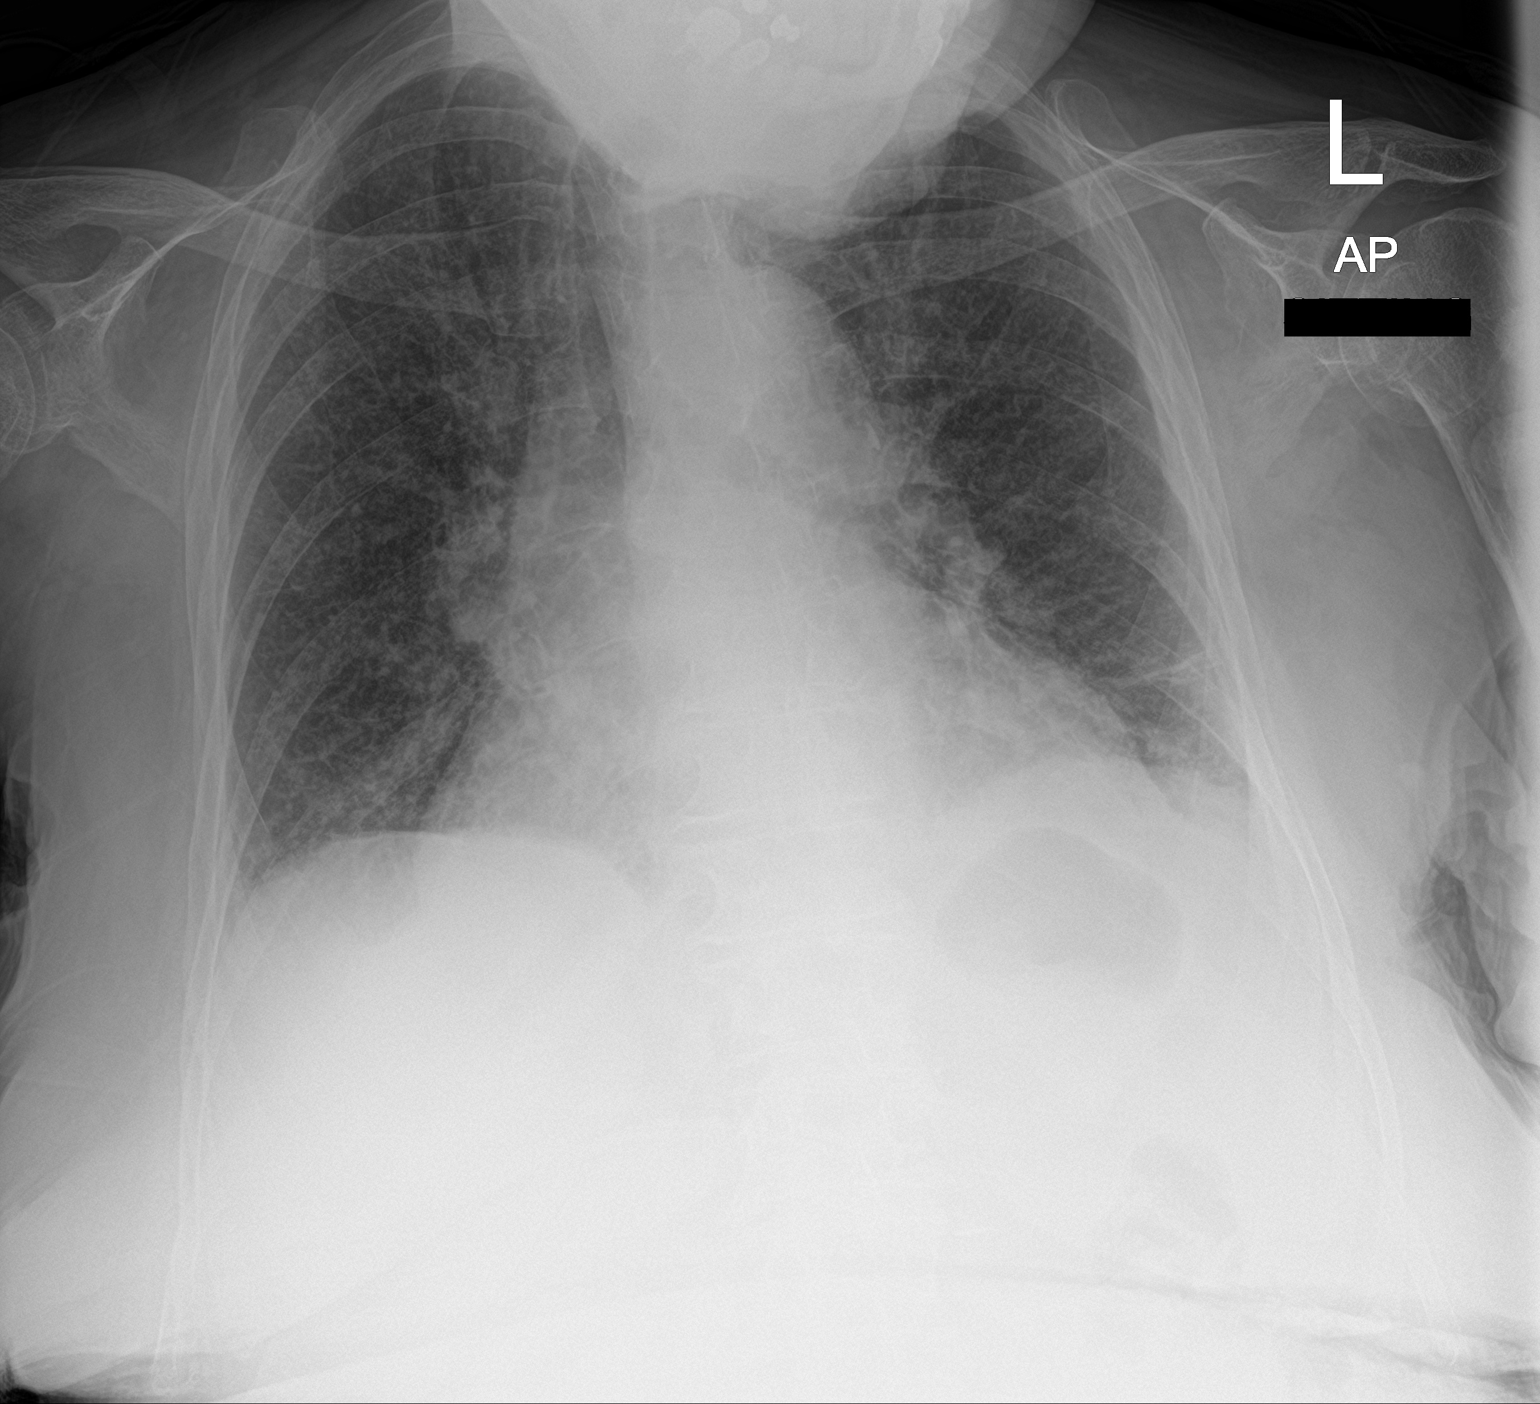

[2 of 2 positions shown; findings below may reference images not displayed]

FINDINGS: Cardiomegaly is unchanged. There is mild interstitial pulmonary
edema. No pneumothorax or pleural effusion. No focal consolidation.
IMPRESSION: Mild cardiomegaly and pulmonary edema.
